# Patient Record
Sex: Female | Born: 1935
Health system: Southern US, Community
[De-identification: ages and names within clinical notes are randomized; demographics above are authoritative.]

## PROBLEM LIST (undated history)

## (undated) DIAGNOSIS — I440 Atrioventricular block, first degree: Secondary | ICD-10-CM

## (undated) DIAGNOSIS — I498 Other specified cardiac arrhythmias: Secondary | ICD-10-CM

## (undated) DIAGNOSIS — I639 Cerebral infarction, unspecified: Secondary | ICD-10-CM

## (undated) DIAGNOSIS — I491 Atrial premature depolarization: Secondary | ICD-10-CM

## (undated) DIAGNOSIS — S270XXA Traumatic pneumothorax, initial encounter: Secondary | ICD-10-CM

## (undated) DIAGNOSIS — M2040 Other hammer toe(s) (acquired), unspecified foot: Secondary | ICD-10-CM

## (undated) DIAGNOSIS — E785 Hyperlipidemia, unspecified: Secondary | ICD-10-CM

## (undated) DIAGNOSIS — R410 Disorientation, unspecified: Secondary | ICD-10-CM

## (undated) DIAGNOSIS — R002 Palpitations: Secondary | ICD-10-CM

## (undated) DIAGNOSIS — F329 Major depressive disorder, single episode, unspecified: Secondary | ICD-10-CM

## (undated) DIAGNOSIS — F32A Depression, unspecified: Secondary | ICD-10-CM

## (undated) DIAGNOSIS — M79609 Pain in unspecified limb: Secondary | ICD-10-CM

## (undated) DIAGNOSIS — Z7901 Long term (current) use of anticoagulants: Secondary | ICD-10-CM

## (undated) DIAGNOSIS — R413 Other amnesia: Secondary | ICD-10-CM

## (undated) DIAGNOSIS — H9190 Unspecified hearing loss, unspecified ear: Secondary | ICD-10-CM

## (undated) DIAGNOSIS — I480 Paroxysmal atrial fibrillation: Secondary | ICD-10-CM

## (undated) DIAGNOSIS — I493 Ventricular premature depolarization: Secondary | ICD-10-CM

## (undated) DIAGNOSIS — S2231XD Fracture of one rib, right side, subsequent encounter for fracture with routine healing: Secondary | ICD-10-CM

## (undated) DIAGNOSIS — I471 Supraventricular tachycardia: Secondary | ICD-10-CM

## (undated) HISTORY — DX: Other specified cardiac arrhythmias: I49.8

## (undated) HISTORY — DX: Unspecified hearing loss, unspecified ear: H91.90

## (undated) HISTORY — PX: ABDOMINAL HYSTERECTOMY: SHX81

## (undated) HISTORY — DX: Cerebral infarction, unspecified: I63.9

## (undated) HISTORY — DX: Ventricular premature depolarization: I49.3

## (undated) HISTORY — DX: Supraventricular tachycardia: I47.1

## (undated) HISTORY — DX: Depression, unspecified: F32.A

## (undated) HISTORY — DX: Hyperlipidemia, unspecified: E78.5

## (undated) HISTORY — PX: SHOULDER SURGERY: SHX246

## (undated) HISTORY — DX: Disorientation, unspecified: R41.0

## (undated) HISTORY — DX: Traumatic pneumothorax, initial encounter: S27.0XXA

## (undated) HISTORY — DX: Other amnesia: R41.3

## (undated) HISTORY — DX: Pain in unspecified limb: M79.609

## (undated) HISTORY — DX: Paroxysmal atrial fibrillation: I48.0

## (undated) HISTORY — DX: Atrioventricular block, first degree: I44.0

## (undated) HISTORY — PX: TONSILLECTOMY: SUR1361

## (undated) HISTORY — DX: Palpitations: R00.2

## (undated) HISTORY — DX: Atrial premature depolarization: I49.1

## (undated) HISTORY — DX: Other hammer toe(s) (acquired), unspecified foot: M20.40

## (undated) HISTORY — DX: Long term (current) use of anticoagulants: Z79.01

## (undated) HISTORY — DX: Fracture of one rib, right side, subsequent encounter for fracture with routine healing: S22.31XD

---
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
## 2014-09-03 DIAGNOSIS — R0609 Other forms of dyspnea: Secondary | ICD-10-CM | POA: Diagnosis not present

## 2014-09-03 DIAGNOSIS — J449 Chronic obstructive pulmonary disease, unspecified: Secondary | ICD-10-CM | POA: Diagnosis not present

## 2014-09-04 DIAGNOSIS — J310 Chronic rhinitis: Secondary | ICD-10-CM | POA: Diagnosis not present

## 2014-09-04 DIAGNOSIS — F328 Other depressive episodes: Secondary | ICD-10-CM | POA: Diagnosis not present

## 2014-09-04 DIAGNOSIS — J449 Chronic obstructive pulmonary disease, unspecified: Secondary | ICD-10-CM | POA: Diagnosis not present

## 2014-09-04 DIAGNOSIS — R0602 Shortness of breath: Secondary | ICD-10-CM | POA: Diagnosis not present

## 2014-09-18 DIAGNOSIS — J4520 Mild intermittent asthma, uncomplicated: Secondary | ICD-10-CM | POA: Diagnosis not present

## 2014-09-26 DIAGNOSIS — J340 Abscess, furuncle and carbuncle of nose: Secondary | ICD-10-CM | POA: Diagnosis not present

## 2014-09-26 DIAGNOSIS — F39 Unspecified mood [affective] disorder: Secondary | ICD-10-CM | POA: Diagnosis not present

## 2014-09-26 DIAGNOSIS — Z681 Body mass index (BMI) 19 or less, adult: Secondary | ICD-10-CM | POA: Diagnosis not present

## 2014-09-30 DIAGNOSIS — J449 Chronic obstructive pulmonary disease, unspecified: Secondary | ICD-10-CM | POA: Diagnosis not present

## 2014-10-10 DIAGNOSIS — J340 Abscess, furuncle and carbuncle of nose: Secondary | ICD-10-CM | POA: Diagnosis not present

## 2014-10-10 DIAGNOSIS — J3489 Other specified disorders of nose and nasal sinuses: Secondary | ICD-10-CM | POA: Diagnosis not present

## 2014-10-26 DIAGNOSIS — R634 Abnormal weight loss: Secondary | ICD-10-CM | POA: Diagnosis not present

## 2014-10-26 DIAGNOSIS — Z681 Body mass index (BMI) 19 or less, adult: Secondary | ICD-10-CM | POA: Diagnosis not present

## 2014-10-30 DIAGNOSIS — R0602 Shortness of breath: Secondary | ICD-10-CM | POA: Diagnosis not present

## 2014-10-30 DIAGNOSIS — B3781 Candidal esophagitis: Secondary | ICD-10-CM | POA: Diagnosis not present

## 2014-10-30 DIAGNOSIS — J449 Chronic obstructive pulmonary disease, unspecified: Secondary | ICD-10-CM | POA: Diagnosis not present

## 2014-10-30 DIAGNOSIS — J4520 Mild intermittent asthma, uncomplicated: Secondary | ICD-10-CM | POA: Diagnosis not present

## 2014-11-07 DIAGNOSIS — J3489 Other specified disorders of nose and nasal sinuses: Secondary | ICD-10-CM | POA: Diagnosis not present

## 2014-11-17 DIAGNOSIS — E785 Hyperlipidemia, unspecified: Secondary | ICD-10-CM | POA: Diagnosis not present

## 2014-11-17 DIAGNOSIS — I10 Essential (primary) hypertension: Secondary | ICD-10-CM | POA: Diagnosis not present

## 2014-11-17 DIAGNOSIS — I739 Peripheral vascular disease, unspecified: Secondary | ICD-10-CM | POA: Diagnosis not present

## 2014-11-17 DIAGNOSIS — R634 Abnormal weight loss: Secondary | ICD-10-CM | POA: Diagnosis not present

## 2014-11-17 DIAGNOSIS — M810 Age-related osteoporosis without current pathological fracture: Secondary | ICD-10-CM | POA: Diagnosis not present

## 2014-11-20 DIAGNOSIS — H16223 Keratoconjunctivitis sicca, not specified as Sjogren's, bilateral: Secondary | ICD-10-CM | POA: Diagnosis not present

## 2014-11-29 DIAGNOSIS — J449 Chronic obstructive pulmonary disease, unspecified: Secondary | ICD-10-CM | POA: Diagnosis not present

## 2014-12-30 DIAGNOSIS — J449 Chronic obstructive pulmonary disease, unspecified: Secondary | ICD-10-CM | POA: Diagnosis not present

## 2015-01-15 DIAGNOSIS — J4520 Mild intermittent asthma, uncomplicated: Secondary | ICD-10-CM | POA: Diagnosis not present

## 2015-01-15 DIAGNOSIS — G4709 Other insomnia: Secondary | ICD-10-CM | POA: Diagnosis not present

## 2015-01-29 DIAGNOSIS — J449 Chronic obstructive pulmonary disease, unspecified: Secondary | ICD-10-CM | POA: Diagnosis not present

## 2015-02-19 DIAGNOSIS — G4709 Other insomnia: Secondary | ICD-10-CM | POA: Diagnosis not present

## 2015-02-19 DIAGNOSIS — J4520 Mild intermittent asthma, uncomplicated: Secondary | ICD-10-CM | POA: Diagnosis not present

## 2015-02-28 DIAGNOSIS — H3531 Nonexudative age-related macular degeneration: Secondary | ICD-10-CM | POA: Diagnosis not present

## 2015-03-01 DIAGNOSIS — J449 Chronic obstructive pulmonary disease, unspecified: Secondary | ICD-10-CM | POA: Diagnosis not present

## 2015-03-20 DIAGNOSIS — Z681 Body mass index (BMI) 19 or less, adult: Secondary | ICD-10-CM | POA: Diagnosis not present

## 2015-03-20 DIAGNOSIS — T63481A Toxic effect of venom of other arthropod, accidental (unintentional), initial encounter: Secondary | ICD-10-CM | POA: Diagnosis not present

## 2015-03-31 DIAGNOSIS — J449 Chronic obstructive pulmonary disease, unspecified: Secondary | ICD-10-CM | POA: Diagnosis not present

## 2015-04-02 DIAGNOSIS — I10 Essential (primary) hypertension: Secondary | ICD-10-CM | POA: Diagnosis not present

## 2015-04-02 DIAGNOSIS — Z7689 Persons encountering health services in other specified circumstances: Secondary | ICD-10-CM | POA: Diagnosis not present

## 2015-04-02 DIAGNOSIS — Z681 Body mass index (BMI) 19 or less, adult: Secondary | ICD-10-CM | POA: Diagnosis not present

## 2015-04-02 DIAGNOSIS — Z0000 Encounter for general adult medical examination without abnormal findings: Secondary | ICD-10-CM | POA: Diagnosis not present

## 2015-04-09 DIAGNOSIS — Z1211 Encounter for screening for malignant neoplasm of colon: Secondary | ICD-10-CM | POA: Diagnosis not present

## 2015-04-12 DIAGNOSIS — Z681 Body mass index (BMI) 19 or less, adult: Secondary | ICD-10-CM | POA: Diagnosis not present

## 2015-04-12 DIAGNOSIS — E785 Hyperlipidemia, unspecified: Secondary | ICD-10-CM | POA: Diagnosis not present

## 2015-04-12 DIAGNOSIS — I10 Essential (primary) hypertension: Secondary | ICD-10-CM | POA: Diagnosis not present

## 2015-04-13 DIAGNOSIS — Z0000 Encounter for general adult medical examination without abnormal findings: Secondary | ICD-10-CM | POA: Diagnosis not present

## 2015-05-28 DIAGNOSIS — G4709 Other insomnia: Secondary | ICD-10-CM | POA: Diagnosis not present

## 2015-05-28 DIAGNOSIS — J449 Chronic obstructive pulmonary disease, unspecified: Secondary | ICD-10-CM | POA: Diagnosis not present

## 2015-06-12 DIAGNOSIS — Z23 Encounter for immunization: Secondary | ICD-10-CM | POA: Diagnosis not present

## 2015-06-14 DIAGNOSIS — I471 Supraventricular tachycardia: Secondary | ICD-10-CM | POA: Insufficient documentation

## 2015-06-18 DIAGNOSIS — I471 Supraventricular tachycardia: Secondary | ICD-10-CM | POA: Diagnosis not present

## 2015-07-23 DIAGNOSIS — M7061 Trochanteric bursitis, right hip: Secondary | ICD-10-CM | POA: Diagnosis not present

## 2015-07-23 DIAGNOSIS — Z681 Body mass index (BMI) 19 or less, adult: Secondary | ICD-10-CM | POA: Diagnosis not present

## 2015-08-20 DIAGNOSIS — I700 Atherosclerosis of aorta: Secondary | ICD-10-CM | POA: Diagnosis not present

## 2015-08-20 DIAGNOSIS — M810 Age-related osteoporosis without current pathological fracture: Secondary | ICD-10-CM | POA: Diagnosis not present

## 2015-08-20 DIAGNOSIS — I7409 Other arterial embolism and thrombosis of abdominal aorta: Secondary | ICD-10-CM | POA: Diagnosis not present

## 2015-08-20 DIAGNOSIS — I10 Essential (primary) hypertension: Secondary | ICD-10-CM | POA: Diagnosis not present

## 2015-08-20 DIAGNOSIS — F39 Unspecified mood [affective] disorder: Secondary | ICD-10-CM | POA: Diagnosis not present

## 2015-08-20 DIAGNOSIS — E785 Hyperlipidemia, unspecified: Secondary | ICD-10-CM | POA: Diagnosis not present

## 2015-09-13 DIAGNOSIS — M1990 Unspecified osteoarthritis, unspecified site: Secondary | ICD-10-CM | POA: Diagnosis not present

## 2015-09-13 DIAGNOSIS — J019 Acute sinusitis, unspecified: Secondary | ICD-10-CM | POA: Diagnosis not present

## 2015-09-17 DIAGNOSIS — J4520 Mild intermittent asthma, uncomplicated: Secondary | ICD-10-CM | POA: Diagnosis not present

## 2015-09-17 DIAGNOSIS — J449 Chronic obstructive pulmonary disease, unspecified: Secondary | ICD-10-CM | POA: Diagnosis not present

## 2015-09-17 DIAGNOSIS — G4700 Insomnia, unspecified: Secondary | ICD-10-CM | POA: Diagnosis not present

## 2015-09-18 DIAGNOSIS — M899 Disorder of bone, unspecified: Secondary | ICD-10-CM | POA: Diagnosis not present

## 2015-09-18 DIAGNOSIS — M25551 Pain in right hip: Secondary | ICD-10-CM | POA: Diagnosis not present

## 2015-11-06 DIAGNOSIS — H2703 Aphakia, bilateral: Secondary | ICD-10-CM | POA: Diagnosis not present

## 2015-11-12 DIAGNOSIS — J040 Acute laryngitis: Secondary | ICD-10-CM | POA: Diagnosis not present

## 2015-11-26 DIAGNOSIS — J019 Acute sinusitis, unspecified: Secondary | ICD-10-CM | POA: Diagnosis not present

## 2015-12-04 DIAGNOSIS — Z681 Body mass index (BMI) 19 or less, adult: Secondary | ICD-10-CM | POA: Diagnosis not present

## 2015-12-04 DIAGNOSIS — I10 Essential (primary) hypertension: Secondary | ICD-10-CM | POA: Diagnosis not present

## 2015-12-04 DIAGNOSIS — E785 Hyperlipidemia, unspecified: Secondary | ICD-10-CM | POA: Diagnosis not present

## 2015-12-04 DIAGNOSIS — I700 Atherosclerosis of aorta: Secondary | ICD-10-CM | POA: Diagnosis not present

## 2015-12-04 DIAGNOSIS — F3289 Other specified depressive episodes: Secondary | ICD-10-CM | POA: Diagnosis not present

## 2015-12-17 DIAGNOSIS — N390 Urinary tract infection, site not specified: Secondary | ICD-10-CM | POA: Diagnosis not present

## 2015-12-17 DIAGNOSIS — Z681 Body mass index (BMI) 19 or less, adult: Secondary | ICD-10-CM | POA: Diagnosis not present

## 2015-12-17 DIAGNOSIS — F3289 Other specified depressive episodes: Secondary | ICD-10-CM | POA: Diagnosis not present

## 2015-12-17 DIAGNOSIS — D519 Vitamin B12 deficiency anemia, unspecified: Secondary | ICD-10-CM | POA: Diagnosis not present

## 2015-12-24 DIAGNOSIS — F3289 Other specified depressive episodes: Secondary | ICD-10-CM | POA: Diagnosis not present

## 2015-12-24 DIAGNOSIS — Z681 Body mass index (BMI) 19 or less, adult: Secondary | ICD-10-CM | POA: Diagnosis not present

## 2016-01-07 DIAGNOSIS — F3289 Other specified depressive episodes: Secondary | ICD-10-CM | POA: Diagnosis not present

## 2016-01-07 DIAGNOSIS — Z681 Body mass index (BMI) 19 or less, adult: Secondary | ICD-10-CM | POA: Diagnosis not present

## 2016-01-23 DIAGNOSIS — F09 Unspecified mental disorder due to known physiological condition: Secondary | ICD-10-CM | POA: Diagnosis not present

## 2016-01-23 DIAGNOSIS — Z681 Body mass index (BMI) 19 or less, adult: Secondary | ICD-10-CM | POA: Diagnosis not present

## 2016-01-23 DIAGNOSIS — D519 Vitamin B12 deficiency anemia, unspecified: Secondary | ICD-10-CM | POA: Diagnosis not present

## 2016-02-04 DIAGNOSIS — S52352A Displaced comminuted fracture of shaft of radius, left arm, initial encounter for closed fracture: Secondary | ICD-10-CM | POA: Diagnosis not present

## 2016-02-04 DIAGNOSIS — S62102A Fracture of unspecified carpal bone, left wrist, initial encounter for closed fracture: Secondary | ICD-10-CM | POA: Diagnosis not present

## 2016-02-04 DIAGNOSIS — S52502A Unspecified fracture of the lower end of left radius, initial encounter for closed fracture: Secondary | ICD-10-CM | POA: Diagnosis not present

## 2016-02-08 DIAGNOSIS — S52502A Unspecified fracture of the lower end of left radius, initial encounter for closed fracture: Secondary | ICD-10-CM | POA: Diagnosis not present

## 2016-02-25 DIAGNOSIS — S52502D Unspecified fracture of the lower end of left radius, subsequent encounter for closed fracture with routine healing: Secondary | ICD-10-CM | POA: Diagnosis not present

## 2016-03-10 DIAGNOSIS — S52502D Unspecified fracture of the lower end of left radius, subsequent encounter for closed fracture with routine healing: Secondary | ICD-10-CM | POA: Diagnosis not present

## 2016-03-27 DIAGNOSIS — S52502D Unspecified fracture of the lower end of left radius, subsequent encounter for closed fracture with routine healing: Secondary | ICD-10-CM | POA: Diagnosis not present

## 2016-03-27 DIAGNOSIS — M79672 Pain in left foot: Secondary | ICD-10-CM | POA: Diagnosis not present

## 2016-03-31 DIAGNOSIS — M25632 Stiffness of left wrist, not elsewhere classified: Secondary | ICD-10-CM | POA: Diagnosis not present

## 2016-03-31 DIAGNOSIS — S52502D Unspecified fracture of the lower end of left radius, subsequent encounter for closed fracture with routine healing: Secondary | ICD-10-CM | POA: Diagnosis not present

## 2016-03-31 DIAGNOSIS — M25532 Pain in left wrist: Secondary | ICD-10-CM | POA: Diagnosis not present

## 2016-04-04 DIAGNOSIS — S52502D Unspecified fracture of the lower end of left radius, subsequent encounter for closed fracture with routine healing: Secondary | ICD-10-CM | POA: Diagnosis not present

## 2016-04-04 DIAGNOSIS — M25632 Stiffness of left wrist, not elsewhere classified: Secondary | ICD-10-CM | POA: Diagnosis not present

## 2016-04-04 DIAGNOSIS — M25532 Pain in left wrist: Secondary | ICD-10-CM | POA: Diagnosis not present

## 2016-04-07 DIAGNOSIS — M25532 Pain in left wrist: Secondary | ICD-10-CM | POA: Diagnosis not present

## 2016-04-07 DIAGNOSIS — M25632 Stiffness of left wrist, not elsewhere classified: Secondary | ICD-10-CM | POA: Diagnosis not present

## 2016-04-07 DIAGNOSIS — S52502D Unspecified fracture of the lower end of left radius, subsequent encounter for closed fracture with routine healing: Secondary | ICD-10-CM | POA: Diagnosis not present

## 2016-04-11 DIAGNOSIS — M25632 Stiffness of left wrist, not elsewhere classified: Secondary | ICD-10-CM | POA: Diagnosis not present

## 2016-04-11 DIAGNOSIS — S52502D Unspecified fracture of the lower end of left radius, subsequent encounter for closed fracture with routine healing: Secondary | ICD-10-CM | POA: Diagnosis not present

## 2016-04-11 DIAGNOSIS — M25532 Pain in left wrist: Secondary | ICD-10-CM | POA: Diagnosis not present

## 2016-04-15 DIAGNOSIS — M25532 Pain in left wrist: Secondary | ICD-10-CM | POA: Diagnosis not present

## 2016-04-15 DIAGNOSIS — M25632 Stiffness of left wrist, not elsewhere classified: Secondary | ICD-10-CM | POA: Diagnosis not present

## 2016-04-15 DIAGNOSIS — S52502D Unspecified fracture of the lower end of left radius, subsequent encounter for closed fracture with routine healing: Secondary | ICD-10-CM | POA: Diagnosis not present

## 2016-04-18 DIAGNOSIS — S52502D Unspecified fracture of the lower end of left radius, subsequent encounter for closed fracture with routine healing: Secondary | ICD-10-CM | POA: Diagnosis not present

## 2016-04-21 DIAGNOSIS — E785 Hyperlipidemia, unspecified: Secondary | ICD-10-CM | POA: Diagnosis not present

## 2016-04-21 DIAGNOSIS — I700 Atherosclerosis of aorta: Secondary | ICD-10-CM | POA: Diagnosis not present

## 2016-04-21 DIAGNOSIS — J449 Chronic obstructive pulmonary disease, unspecified: Secondary | ICD-10-CM | POA: Diagnosis not present

## 2016-04-21 DIAGNOSIS — F09 Unspecified mental disorder due to known physiological condition: Secondary | ICD-10-CM | POA: Diagnosis not present

## 2016-04-21 DIAGNOSIS — I10 Essential (primary) hypertension: Secondary | ICD-10-CM | POA: Diagnosis not present

## 2016-04-21 DIAGNOSIS — R634 Abnormal weight loss: Secondary | ICD-10-CM | POA: Diagnosis not present

## 2016-05-29 DIAGNOSIS — E785 Hyperlipidemia, unspecified: Secondary | ICD-10-CM | POA: Diagnosis not present

## 2016-05-29 DIAGNOSIS — I471 Supraventricular tachycardia: Secondary | ICD-10-CM | POA: Diagnosis not present

## 2016-05-30 DIAGNOSIS — I471 Supraventricular tachycardia: Secondary | ICD-10-CM | POA: Diagnosis not present

## 2016-06-06 DIAGNOSIS — Z23 Encounter for immunization: Secondary | ICD-10-CM | POA: Diagnosis not present

## 2016-07-30 DIAGNOSIS — M810 Age-related osteoporosis without current pathological fracture: Secondary | ICD-10-CM | POA: Diagnosis not present

## 2016-07-30 DIAGNOSIS — E782 Mixed hyperlipidemia: Secondary | ICD-10-CM | POA: Diagnosis not present

## 2016-07-30 DIAGNOSIS — I10 Essential (primary) hypertension: Secondary | ICD-10-CM | POA: Diagnosis not present

## 2016-11-05 DIAGNOSIS — J189 Pneumonia, unspecified organism: Secondary | ICD-10-CM | POA: Diagnosis not present

## 2016-11-05 DIAGNOSIS — G4700 Insomnia, unspecified: Secondary | ICD-10-CM | POA: Diagnosis not present

## 2016-11-05 DIAGNOSIS — F329 Major depressive disorder, single episode, unspecified: Secondary | ICD-10-CM | POA: Diagnosis not present

## 2016-11-05 DIAGNOSIS — E785 Hyperlipidemia, unspecified: Secondary | ICD-10-CM | POA: Diagnosis not present

## 2016-11-05 DIAGNOSIS — Z79899 Other long term (current) drug therapy: Secondary | ICD-10-CM | POA: Diagnosis not present

## 2016-11-05 DIAGNOSIS — I2699 Other pulmonary embolism without acute cor pulmonale: Secondary | ICD-10-CM | POA: Diagnosis not present

## 2016-11-05 DIAGNOSIS — Z880 Allergy status to penicillin: Secondary | ICD-10-CM | POA: Diagnosis not present

## 2016-11-05 DIAGNOSIS — Z885 Allergy status to narcotic agent status: Secondary | ICD-10-CM | POA: Diagnosis not present

## 2016-11-05 DIAGNOSIS — Z882 Allergy status to sulfonamides status: Secondary | ICD-10-CM | POA: Diagnosis not present

## 2016-11-05 DIAGNOSIS — J440 Chronic obstructive pulmonary disease with acute lower respiratory infection: Secondary | ICD-10-CM | POA: Diagnosis not present

## 2016-11-05 DIAGNOSIS — R079 Chest pain, unspecified: Secondary | ICD-10-CM | POA: Diagnosis not present

## 2016-11-07 DIAGNOSIS — R079 Chest pain, unspecified: Secondary | ICD-10-CM | POA: Diagnosis not present

## 2016-11-07 DIAGNOSIS — J449 Chronic obstructive pulmonary disease, unspecified: Secondary | ICD-10-CM | POA: Diagnosis not present

## 2016-11-07 DIAGNOSIS — E7800 Pure hypercholesterolemia, unspecified: Secondary | ICD-10-CM | POA: Diagnosis not present

## 2016-11-07 DIAGNOSIS — R002 Palpitations: Secondary | ICD-10-CM | POA: Diagnosis not present

## 2016-11-12 DIAGNOSIS — I2692 Saddle embolus of pulmonary artery without acute cor pulmonale: Secondary | ICD-10-CM | POA: Diagnosis not present

## 2016-11-26 DIAGNOSIS — I82442 Acute embolism and thrombosis of left tibial vein: Secondary | ICD-10-CM | POA: Diagnosis not present

## 2016-11-28 DIAGNOSIS — I2699 Other pulmonary embolism without acute cor pulmonale: Secondary | ICD-10-CM | POA: Diagnosis not present

## 2016-11-28 DIAGNOSIS — I80232 Phlebitis and thrombophlebitis of left tibial vein: Secondary | ICD-10-CM | POA: Diagnosis not present

## 2016-12-29 DIAGNOSIS — R490 Dysphonia: Secondary | ICD-10-CM | POA: Diagnosis not present

## 2016-12-29 DIAGNOSIS — J019 Acute sinusitis, unspecified: Secondary | ICD-10-CM | POA: Diagnosis not present

## 2016-12-30 DIAGNOSIS — G309 Alzheimer's disease, unspecified: Secondary | ICD-10-CM | POA: Diagnosis not present

## 2016-12-30 DIAGNOSIS — D518 Other vitamin B12 deficiency anemias: Secondary | ICD-10-CM | POA: Diagnosis not present

## 2016-12-30 DIAGNOSIS — D509 Iron deficiency anemia, unspecified: Secondary | ICD-10-CM | POA: Diagnosis not present

## 2016-12-30 DIAGNOSIS — I10 Essential (primary) hypertension: Secondary | ICD-10-CM | POA: Diagnosis not present

## 2016-12-30 DIAGNOSIS — I2699 Other pulmonary embolism without acute cor pulmonale: Secondary | ICD-10-CM | POA: Diagnosis not present

## 2016-12-30 DIAGNOSIS — E782 Mixed hyperlipidemia: Secondary | ICD-10-CM | POA: Diagnosis not present

## 2017-01-28 DIAGNOSIS — R490 Dysphonia: Secondary | ICD-10-CM | POA: Diagnosis not present

## 2017-01-28 DIAGNOSIS — J019 Acute sinusitis, unspecified: Secondary | ICD-10-CM | POA: Diagnosis not present

## 2017-02-16 DIAGNOSIS — M79675 Pain in left toe(s): Secondary | ICD-10-CM | POA: Diagnosis not present

## 2017-03-05 ENCOUNTER — Ambulatory Visit: Payer: Medicare Other | Admitting: Sports Medicine

## 2017-05-12 DIAGNOSIS — H2703 Aphakia, bilateral: Secondary | ICD-10-CM | POA: Diagnosis not present

## 2017-05-25 DIAGNOSIS — E782 Mixed hyperlipidemia: Secondary | ICD-10-CM | POA: Diagnosis not present

## 2017-05-25 DIAGNOSIS — Z23 Encounter for immunization: Secondary | ICD-10-CM | POA: Diagnosis not present

## 2017-05-25 DIAGNOSIS — G309 Alzheimer's disease, unspecified: Secondary | ICD-10-CM | POA: Diagnosis not present

## 2017-05-25 DIAGNOSIS — I10 Essential (primary) hypertension: Secondary | ICD-10-CM | POA: Diagnosis not present

## 2017-05-25 DIAGNOSIS — I2699 Other pulmonary embolism without acute cor pulmonale: Secondary | ICD-10-CM | POA: Diagnosis not present

## 2017-05-26 DIAGNOSIS — S299XXA Unspecified injury of thorax, initial encounter: Secondary | ICD-10-CM | POA: Diagnosis not present

## 2017-05-26 DIAGNOSIS — J1289 Other viral pneumonia: Secondary | ICD-10-CM | POA: Diagnosis not present

## 2017-05-26 DIAGNOSIS — M8580 Other specified disorders of bone density and structure, unspecified site: Secondary | ICD-10-CM | POA: Diagnosis not present

## 2017-05-26 DIAGNOSIS — M4185 Other forms of scoliosis, thoracolumbar region: Secondary | ICD-10-CM | POA: Diagnosis not present

## 2017-06-26 DIAGNOSIS — M2040 Other hammer toe(s) (acquired), unspecified foot: Secondary | ICD-10-CM

## 2017-06-26 DIAGNOSIS — M79609 Pain in unspecified limb: Secondary | ICD-10-CM | POA: Insufficient documentation

## 2017-06-26 DIAGNOSIS — R002 Palpitations: Secondary | ICD-10-CM

## 2017-06-26 DIAGNOSIS — I480 Paroxysmal atrial fibrillation: Secondary | ICD-10-CM | POA: Insufficient documentation

## 2017-06-26 DIAGNOSIS — E785 Hyperlipidemia, unspecified: Secondary | ICD-10-CM

## 2017-06-26 DIAGNOSIS — I498 Other specified cardiac arrhythmias: Secondary | ICD-10-CM

## 2017-06-26 DIAGNOSIS — I491 Atrial premature depolarization: Secondary | ICD-10-CM | POA: Insufficient documentation

## 2017-06-26 DIAGNOSIS — I493 Ventricular premature depolarization: Secondary | ICD-10-CM

## 2017-06-26 DIAGNOSIS — R413 Other amnesia: Secondary | ICD-10-CM | POA: Insufficient documentation

## 2017-06-26 DIAGNOSIS — I4819 Other persistent atrial fibrillation: Secondary | ICD-10-CM | POA: Insufficient documentation

## 2017-06-26 DIAGNOSIS — I440 Atrioventricular block, first degree: Secondary | ICD-10-CM

## 2017-06-26 HISTORY — DX: Paroxysmal atrial fibrillation: I48.0

## 2017-06-26 HISTORY — DX: Other specified cardiac arrhythmias: I49.8

## 2017-06-26 HISTORY — DX: Pain in unspecified limb: M79.609

## 2017-06-26 HISTORY — DX: Palpitations: R00.2

## 2017-06-26 HISTORY — DX: Ventricular premature depolarization: I49.3

## 2017-06-26 HISTORY — DX: Other hammer toe(s) (acquired), unspecified foot: M20.40

## 2017-06-26 HISTORY — DX: Atrial premature depolarization: I49.1

## 2017-06-26 HISTORY — DX: Atrioventricular block, first degree: I44.0

## 2017-06-26 HISTORY — DX: Hyperlipidemia, unspecified: E78.5

## 2017-06-30 DIAGNOSIS — S2231XD Fracture of one rib, right side, subsequent encounter for fracture with routine healing: Secondary | ICD-10-CM

## 2017-06-30 DIAGNOSIS — S270XXA Traumatic pneumothorax, initial encounter: Secondary | ICD-10-CM

## 2017-06-30 HISTORY — DX: Fracture of one rib, right side, subsequent encounter for fracture with routine healing: S22.31XD

## 2017-06-30 HISTORY — DX: Traumatic pneumothorax, initial encounter: S27.0XXA

## 2017-07-13 DIAGNOSIS — J939 Pneumothorax, unspecified: Secondary | ICD-10-CM | POA: Diagnosis not present

## 2017-07-21 DIAGNOSIS — I471 Supraventricular tachycardia: Secondary | ICD-10-CM | POA: Diagnosis not present

## 2017-07-21 DIAGNOSIS — Z7901 Long term (current) use of anticoagulants: Secondary | ICD-10-CM

## 2017-07-21 DIAGNOSIS — I480 Paroxysmal atrial fibrillation: Secondary | ICD-10-CM | POA: Diagnosis not present

## 2017-07-21 HISTORY — DX: Long term (current) use of anticoagulants: Z79.01

## 2017-08-12 DIAGNOSIS — M25511 Pain in right shoulder: Secondary | ICD-10-CM | POA: Diagnosis not present

## 2017-09-23 DIAGNOSIS — H2703 Aphakia, bilateral: Secondary | ICD-10-CM | POA: Diagnosis not present

## 2017-09-24 DIAGNOSIS — D518 Other vitamin B12 deficiency anemias: Secondary | ICD-10-CM | POA: Diagnosis not present

## 2017-09-24 DIAGNOSIS — I10 Essential (primary) hypertension: Secondary | ICD-10-CM | POA: Diagnosis not present

## 2017-09-24 DIAGNOSIS — D649 Anemia, unspecified: Secondary | ICD-10-CM | POA: Diagnosis not present

## 2017-09-24 DIAGNOSIS — I482 Chronic atrial fibrillation: Secondary | ICD-10-CM | POA: Diagnosis not present

## 2017-09-24 DIAGNOSIS — G309 Alzheimer's disease, unspecified: Secondary | ICD-10-CM | POA: Diagnosis not present

## 2017-09-24 DIAGNOSIS — E782 Mixed hyperlipidemia: Secondary | ICD-10-CM | POA: Diagnosis not present

## 2017-09-24 DIAGNOSIS — I2699 Other pulmonary embolism without acute cor pulmonale: Secondary | ICD-10-CM | POA: Diagnosis not present

## 2017-11-09 DIAGNOSIS — E782 Mixed hyperlipidemia: Secondary | ICD-10-CM | POA: Diagnosis not present

## 2017-11-09 DIAGNOSIS — I10 Essential (primary) hypertension: Secondary | ICD-10-CM | POA: Diagnosis not present

## 2017-11-09 DIAGNOSIS — I482 Chronic atrial fibrillation: Secondary | ICD-10-CM | POA: Diagnosis not present

## 2017-11-10 DIAGNOSIS — I471 Supraventricular tachycardia, unspecified: Secondary | ICD-10-CM | POA: Insufficient documentation

## 2017-11-10 HISTORY — DX: Supraventricular tachycardia, unspecified: I47.10

## 2017-11-10 HISTORY — DX: Supraventricular tachycardia: I47.1

## 2017-11-13 DIAGNOSIS — H6990 Unspecified Eustachian tube disorder, unspecified ear: Secondary | ICD-10-CM | POA: Diagnosis not present

## 2017-11-16 DIAGNOSIS — J449 Chronic obstructive pulmonary disease, unspecified: Secondary | ICD-10-CM | POA: Insufficient documentation

## 2017-11-16 DIAGNOSIS — I498 Other specified cardiac arrhythmias: Secondary | ICD-10-CM | POA: Insufficient documentation

## 2017-11-16 HISTORY — DX: Chronic obstructive pulmonary disease, unspecified: J44.9

## 2017-11-16 NOTE — Progress Notes (Signed)
Cardiology Office Note:    Date:  11/17/2017   ID:  [name], DOB 02/26/1926, MRN [id number]  PCP:  Abigail MiyamotoPerry, Lawrence Edward, MD  Cardiologist:  Norman HerrlichBrian Munley, MD    Referring MD: Abigail MiyamotoPerry, Lawrence Edward,*    ASSESSMENT:    1. PAT (paroxysmal atrial tachycardia) (HCC)   2. Chaotic atrial rhythm   3. Pure hypercholesterolemia   4. Chronic obstructive pulmonary disease, unspecified COPD type (HCC)    PLAN:    In order of problems listed above:  1. Stable she has had no recurrence and will continue low-dose selective beta-blocker.  She remains anticoagulated with a history of venous thromboembolism and remote atrial fibrillation 2. Stable remains in a chaotic atrial rhythm today asymptomatic 3. Stable continue her statin 4. Stable continue reduced dose anticoagulant   Next appointment: 6 months   Medication Adjustments/Labs and Tests Ordered: Current medicines are reviewed at length with the patient today.  Concerns regarding medicines are outlined above.  Orders Placed This Encounter  Procedures  . EKG 12-Lead   No orders of the defined types were placed in this encounter.   Chief Complaint  Patient presents with  . Follow-up    1.5  year follow up appt   . Atrial Fibrillation  . Anticoagulation    History of Present Illness:    [name] is a 84 y.o. female with a hx of SVT with PAT, Dyslipidemia, COPD  last seen 11/17/17.[name]. Compliance with diet, lifestyle and medications: Yes She has done well and is pleased with the quality of her life.  She had a slip from a stool rib fractures and pneumothorax and has recovered.  She has no palpitation or syncope chest pain shortness of breath.  Her daughter is concerned because at times her blood pressure is borderline low but is in range today Past Medical History:  Diagnosis Date  . Closed fracture of one rib of right side with routine healing 06/30/2017  . Current use of long term anticoagulation 07/21/2017  .  First degree atrioventricular block 06/26/2017  . Hammer toe 06/26/2017  . Hyperlipidemia 06/26/2017  . Other specified cardiac arrhythmias 06/26/2017  . Pain in limb 06/26/2017  . Palpitations 06/26/2017  . Paroxysmal atrial fibrillation (HCC) 06/26/2017   Overview:  remote history post operatively, CHADS 2 score=2 Overview:  Overview:  remote history post operatively, CHADS 2 score=2  . PAT (paroxysmal atrial tachycardia) (HCC) 06/14/2015  . Premature atrial complex 06/26/2017  . Premature ventricular contractions 06/26/2017  . PSVT (paroxysmal supraventricular tachycardia) (HCC) 11/10/2017  . Traumatic pneumothorax 06/30/2017    Past Surgical History:  Procedure Laterality Date  . ABDOMINAL HYSTERECTOMY    . SHOULDER SURGERY    . TONSILLECTOMY      Current Medications: Current Meds  Medication Sig  . acebutolol (SECTRAL) 200 MG capsule Take 200 mg by mouth daily.  . Cholecalciferol (VITAMIN D3) 1000 units CAPS Take 1,000 Units by mouth daily.  . mirtazapine (REMERON) 15 MG tablet Take 15 mg by mouth daily.  . Multiple Vitamin (MULTI-VITAMIN DAILY PO) Take 1 tablet by mouth daily.  . Polyethylene Glycol 3350 (PEG 3350) POWD Take 17 g by mouth daily.  . pravastatin (PRAVACHOL) 40 MG tablet Take 40 mg by mouth daily.  . rivaroxaban (XARELTO) 10 MG TABS tablet Take 10 mg by mouth daily.  . vitamin B-12 (CYANOCOBALAMIN) 1000 MCG tablet Take 1,000 mcg by mouth daily.     Allergies:   Zoloft [sertraline hcl]; Codeine; Penicillins; and  Sulfa antibiotics   Social History   Socioeconomic History  . Marital status: Single    Spouse name: None  . Number of children: None  . Years of education: None  . Highest education level: None  Social Needs  . Financial resource strain: None  . Food insecurity - worry: None  . Food insecurity - inability: None  . Transportation needs - medical: None  . Transportation needs - non-medical: None  Occupational History  . None  Tobacco Use    . Smoking status: Never Smoker  . Smokeless tobacco: Never Used  Substance and Sexual Activity  . Alcohol use: No    Frequency: Never  . Drug use: No  . Sexual activity: None  Other Topics Concern  . None  Social History Narrative  . None     Family History: The patient's family history includes Bone cancer in her mother; Breast cancer in her father and sister; Diabetes in her father; Heart disease in her father. ROS:   Please see the history of present illness.    All other systems reviewed and are negative.  EKGs/Labs/Other Studies Reviewed:    The following studies were reviewed today:  EKG:  EKG ordered today.  The ekg ordered today demonstrates chaotic atrial rhythm EKG 09/24/17 Chaotic atrial rhythm Recent Labs:  11/10/17: CBC CMP normal K 4.9 No results found for requested labs within last 8760 hours.  Recent Lipid Panel   Chol 215, HDL 57 LDL 137 No results found for: CHOL, TRIG, HDL, CHOLHDL, VLDL, LDLCALC, LDLDIRECT  Physical Exam:    VS:  BP 126/78 (BP Location: Right Arm, Patient Position: Sitting, Cuff Size: Normal)   Pulse 70   Ht 5\' 3"  (1.6 m)   Wt 108 lb 12.8 oz (49.4 kg)   SpO2 99%   BMI 19.27 kg/m     Wt Readings from Last 3 Encounters:  11/17/17 108 lb 12.8 oz (49.4 kg)  She appears frail GEN:  Well nourished, well developed in no acute distress HEENT: Normal NECK: No JVD; No carotid bruits LYMPHATICS: No lymphadenopathy CARDIAC:  RRR, no murmurs, rubs, gallops RESPIRATORY:  Clear to auscultation without rales, wheezing or rhonchi  ABDOMEN: Soft, non-tender, non-distended MUSCULOSKELETAL:  No edema; No deformity  SKIN: Warm and dry NEUROLOGIC:  Alert and oriented x 3 PSYCHIATRIC:  Normal affect    Signed, Norman Herrlich, MD  11/17/2017 11:52 AM    [facility]

## 2017-11-17 ENCOUNTER — Ambulatory Visit: Payer: Medicare Other | Admitting: Cardiology

## 2017-11-17 ENCOUNTER — Encounter: Payer: Self-pay | Admitting: Cardiology

## 2017-11-17 VITALS — BP 126/78 | HR 70 | Ht 63.0 in | Wt 108.8 lb

## 2017-11-17 DIAGNOSIS — J449 Chronic obstructive pulmonary disease, unspecified: Secondary | ICD-10-CM

## 2017-11-17 DIAGNOSIS — I471 Supraventricular tachycardia: Secondary | ICD-10-CM

## 2017-11-17 DIAGNOSIS — E7800 Pure hypercholesterolemia, unspecified: Secondary | ICD-10-CM

## 2017-11-17 DIAGNOSIS — I498 Other specified cardiac arrhythmias: Secondary | ICD-10-CM

## 2017-11-17 NOTE — Patient Instructions (Signed)

## 2017-11-24 ENCOUNTER — Encounter: Payer: Self-pay | Admitting: Cardiology

## 2018-01-05 DIAGNOSIS — H6121 Impacted cerumen, right ear: Secondary | ICD-10-CM | POA: Diagnosis not present

## 2018-01-05 DIAGNOSIS — J301 Allergic rhinitis due to pollen: Secondary | ICD-10-CM | POA: Diagnosis not present

## 2018-01-22 DIAGNOSIS — H9193 Unspecified hearing loss, bilateral: Secondary | ICD-10-CM | POA: Diagnosis not present

## 2018-01-22 DIAGNOSIS — H7403 Tympanosclerosis, bilateral: Secondary | ICD-10-CM | POA: Diagnosis not present

## 2018-01-22 DIAGNOSIS — H938X3 Other specified disorders of ear, bilateral: Secondary | ICD-10-CM | POA: Diagnosis not present

## 2018-01-22 DIAGNOSIS — J342 Deviated nasal septum: Secondary | ICD-10-CM | POA: Diagnosis not present

## 2018-02-04 DIAGNOSIS — R42 Dizziness and giddiness: Secondary | ICD-10-CM | POA: Diagnosis not present

## 2018-02-04 DIAGNOSIS — H66001 Acute suppurative otitis media without spontaneous rupture of ear drum, right ear: Secondary | ICD-10-CM | POA: Diagnosis not present

## 2018-02-19 DIAGNOSIS — Z9842 Cataract extraction status, left eye: Secondary | ICD-10-CM | POA: Diagnosis not present

## 2018-02-19 DIAGNOSIS — H353131 Nonexudative age-related macular degeneration, bilateral, early dry stage: Secondary | ICD-10-CM | POA: Diagnosis not present

## 2018-02-24 DIAGNOSIS — H66001 Acute suppurative otitis media without spontaneous rupture of ear drum, right ear: Secondary | ICD-10-CM | POA: Diagnosis not present

## 2018-03-10 DIAGNOSIS — H02403 Unspecified ptosis of bilateral eyelids: Secondary | ICD-10-CM | POA: Diagnosis not present

## 2018-03-11 DIAGNOSIS — I482 Chronic atrial fibrillation: Secondary | ICD-10-CM | POA: Diagnosis not present

## 2018-03-11 DIAGNOSIS — E782 Mixed hyperlipidemia: Secondary | ICD-10-CM | POA: Diagnosis not present

## 2018-03-11 DIAGNOSIS — G309 Alzheimer's disease, unspecified: Secondary | ICD-10-CM | POA: Diagnosis not present

## 2018-03-11 DIAGNOSIS — I10 Essential (primary) hypertension: Secondary | ICD-10-CM | POA: Diagnosis not present

## 2018-03-11 DIAGNOSIS — H7401 Tympanosclerosis, right ear: Secondary | ICD-10-CM | POA: Diagnosis not present

## 2018-04-19 DIAGNOSIS — H903 Sensorineural hearing loss, bilateral: Secondary | ICD-10-CM | POA: Diagnosis not present

## 2018-04-19 DIAGNOSIS — H6123 Impacted cerumen, bilateral: Secondary | ICD-10-CM | POA: Diagnosis not present

## 2018-04-19 DIAGNOSIS — H7311 Chronic myringitis, right ear: Secondary | ICD-10-CM | POA: Diagnosis not present

## 2018-05-11 DIAGNOSIS — H903 Sensorineural hearing loss, bilateral: Secondary | ICD-10-CM | POA: Diagnosis not present

## 2018-05-11 DIAGNOSIS — H6123 Impacted cerumen, bilateral: Secondary | ICD-10-CM | POA: Diagnosis not present

## 2018-05-17 ENCOUNTER — Other Ambulatory Visit: Payer: Self-pay | Admitting: Otolaryngology

## 2018-05-17 DIAGNOSIS — H903 Sensorineural hearing loss, bilateral: Secondary | ICD-10-CM

## 2018-05-26 DIAGNOSIS — S9032XA Contusion of left foot, initial encounter: Secondary | ICD-10-CM | POA: Diagnosis not present

## 2018-06-07 ENCOUNTER — Ambulatory Visit
Admission: RE | Admit: 2018-06-07 | Discharge: 2018-06-07 | Disposition: A | Discharge status: Home / Self Care | Payer: Medicare Other | Source: Ambulatory Visit | Attending: Otolaryngology | Admitting: Otolaryngology

## 2018-06-07 DIAGNOSIS — H903 Sensorineural hearing loss, bilateral: Secondary | ICD-10-CM | POA: Diagnosis not present

## 2018-06-07 MED ORDER — GADOBENATE DIMEGLUMINE 529 MG/ML IV SOLN
9.0000 mL | Freq: Once | INTRAVENOUS | Status: AC | PRN
  Administered 2018-06-07: 9 mL via INTRAVENOUS

## 2018-06-30 DIAGNOSIS — H2703 Aphakia, bilateral: Secondary | ICD-10-CM | POA: Diagnosis not present

## 2018-07-14 DIAGNOSIS — G309 Alzheimer's disease, unspecified: Secondary | ICD-10-CM | POA: Diagnosis not present

## 2018-07-14 DIAGNOSIS — I482 Chronic atrial fibrillation, unspecified: Secondary | ICD-10-CM | POA: Diagnosis not present

## 2018-07-14 DIAGNOSIS — E782 Mixed hyperlipidemia: Secondary | ICD-10-CM | POA: Diagnosis not present

## 2018-07-14 DIAGNOSIS — H906 Mixed conductive and sensorineural hearing loss, bilateral: Secondary | ICD-10-CM | POA: Diagnosis not present

## 2018-07-14 DIAGNOSIS — I10 Essential (primary) hypertension: Secondary | ICD-10-CM | POA: Diagnosis not present

## 2018-07-15 ENCOUNTER — Encounter: Payer: Self-pay | Admitting: Cardiology

## 2018-07-15 ENCOUNTER — Ambulatory Visit (INDEPENDENT_AMBULATORY_CARE_PROVIDER_SITE_OTHER): Payer: Medicare Other | Admitting: Cardiology

## 2018-07-15 VITALS — BP 116/80 | HR 95 | Ht 63.0 in | Wt 106.8 lb

## 2018-07-15 DIAGNOSIS — Z7901 Long term (current) use of anticoagulants: Secondary | ICD-10-CM

## 2018-07-15 DIAGNOSIS — I471 Supraventricular tachycardia: Secondary | ICD-10-CM

## 2018-07-15 DIAGNOSIS — I493 Ventricular premature depolarization: Secondary | ICD-10-CM | POA: Diagnosis not present

## 2018-07-15 NOTE — Progress Notes (Signed)
Cardiology Office Note:    Date:  07/15/2018   ID:  Katherine Hurdleoris S Eagles, DOB 11/11/1925, MRN [id number]  PCP:  Abigail MiyamotoPerry, Lawrence Edward, MD  Cardiologist:  Norman HerrlichBrian Munley, MD    Referring MD: Abigail MiyamotoPerry, Lawrence Edward,*    ASSESSMENT:    1. PAT (paroxysmal atrial tachycardia) (HCC)   2. Premature ventricular contractions   3. Current use of long term anticoagulation    PLAN:    In order of problems listed above:  1. Stable no breakthrough continue low-dose beta-blocker current anticoagulant 2. Stable continue beta-blocker 3. Continue anticoagulant  4. Stable continue pravastatin   Next appointment: 6 months    Medication Adjustments/Labs and Tests Ordered: Current medicines are reviewed at length with the patient today.  Concerns regarding medicines are outlined above.  No orders of the defined types were placed in this encounter.  No orders of the defined types were placed in this encounter.   Chief Complaint  Patient presents with  . Follow-up    PAT    History of Present Illness:    [name] is a 84 y.o. female with a hx of SVT with PAT, Dyslipidemia, COPD last seen 11/17/17. Compliance with diet, lifestyle and medications: Yes  She has done well except for fall and some persistent rib pain.  Very little palpitation no no bleeding on her current anticoagulant.  She is considering cochlear implant from a cardiovascular perspective she is low risk but I doubt that she is going to go ahead and do the procedure.  No chest pain or syncope. Past Medical History:  Diagnosis Date  . Closed fracture of one rib of right side with routine healing 06/30/2017  . Current use of long term anticoagulation 07/21/2017  . First degree atrioventricular block 06/26/2017  . Hammer toe 06/26/2017  . Hyperlipidemia 06/26/2017  . Other specified cardiac arrhythmias 06/26/2017  . Pain in limb 06/26/2017  . Palpitations 06/26/2017  . Paroxysmal atrial fibrillation (HCC) 06/26/2017   Overview:  remote history post operatively, CHADS 2 score=2 Overview:  Overview:  remote history post operatively, CHADS 2 score=2  . PAT (paroxysmal atrial tachycardia) (HCC) 06/14/2015  . Premature atrial complex 06/26/2017  . Premature ventricular contractions 06/26/2017  . PSVT (paroxysmal supraventricular tachycardia) (HCC) 11/10/2017  . Traumatic pneumothorax 06/30/2017    Past Surgical History:  Procedure Laterality Date  . ABDOMINAL HYSTERECTOMY    . SHOULDER SURGERY    . TONSILLECTOMY      Current Medications: Current Meds  Medication Sig  . acebutolol (SECTRAL) 200 MG capsule Take 200 mg by mouth daily.  . Cholecalciferol (VITAMIN D3) 1000 units CAPS Take 1,000 Units by mouth daily.  . mirtazapine (REMERON) 15 MG tablet Take 15 mg by mouth daily.  . Multiple Vitamin (MULTI-VITAMIN DAILY PO) Take 1 tablet by mouth daily.  . Polyethylene Glycol 3350 (PEG 3350) POWD Take 17 g by mouth daily as needed.   . pravastatin (PRAVACHOL) 40 MG tablet Take 40 mg by mouth daily.  . rivaroxaban (XARELTO) 10 MG TABS tablet Take 10 mg by mouth daily.  . vitamin B-12 (CYANOCOBALAMIN) 1000 MCG tablet Take 1,000 mcg by mouth daily.     Allergies:   Zoloft [sertraline hcl]; Codeine; Penicillins; and Sulfa antibiotics   Social History   Socioeconomic History  . Marital status: Single    Spouse name: Not on file  . Number of children: Not on file  . Years of education: Not on file  . Highest education level: Not on  file  Occupational History  . Not on file  Social Needs  . Financial resource strain: Not on file  . Food insecurity:    Worry: Not on file    Inability: Not on file  . Transportation needs:    Medical: Not on file    Non-medical: Not on file  Tobacco Use  . Smoking status: Never Smoker  . Smokeless tobacco: Never Used  Substance and Sexual Activity  . Alcohol use: No    Frequency: Never  . Drug use: No  . Sexual activity: Not on file  Lifestyle  . Physical  activity:    Days per week: Not on file    Minutes per session: Not on file  . Stress: Not on file  Relationships  . Social connections:    Talks on phone: Not on file    Gets together: Not on file    Attends religious service: Not on file    Active member of club or organization: Not on file    Attends meetings of clubs or organizations: Not on file    Relationship status: Not on file  Other Topics Concern  . Not on file  Social History Narrative  . Not on file     Family History: The patient's family history includes Bone cancer in her mother; Breast cancer in her father and sister; Diabetes in her father; Heart disease in her father. ROS:   Please see the history of present illness.    All other systems reviewed and are negative.  EKGs/Labs/Other Studies Reviewed:    The following studies were reviewed today:    Recent Labs: 03/11/2018 cholesterol 182 LDL 111 creatinine is normal No results found for requested labs within last 8760 hours.  Recent Lipid Panel No results found for: CHOL, TRIG, HDL, CHOLHDL, VLDL, LDLCALC, LDLDIRECT  Physical Exam:    VS:  BP 116/80 (BP Location: Left Arm, Patient Position: Sitting, Cuff Size: Small)   Pulse 95   Ht 5\' 3"  (1.6 m)   Wt 106 lb 12.8 oz (48.4 kg)   SpO2 99%   BMI 18.92 kg/m     Wt Readings from Last 3 Encounters:  07/15/18 106 lb 12.8 oz (48.4 kg)  11/17/17 108 lb 12.8 oz (49.4 kg)     GEN: Frail well nourished, her BMI is approaching cachectic range in no acute distress HEENT: Normal NECK: No JVD; No carotid bruits LYMPHATICS: No lymphadenopathy CARDIAC: RRR, no murmurs, rubs, gallops RESPIRATORY:  Clear to auscultation without rales, wheezing or rhonchi  ABDOMEN: Soft, non-tender, non-distended MUSCULOSKELETAL:  No edema; No deformity  SKIN: Warm and dry NEUROLOGIC:  Alert and oriented x 3 PSYCHIATRIC:  Normal affect    Signed, Norman Herrlich, MD  07/15/2018 3:14 PM    [facility]

## 2018-07-15 NOTE — Patient Instructions (Signed)

## 2018-08-10 DIAGNOSIS — H6123 Impacted cerumen, bilateral: Secondary | ICD-10-CM | POA: Diagnosis not present

## 2018-08-10 DIAGNOSIS — H903 Sensorineural hearing loss, bilateral: Secondary | ICD-10-CM | POA: Diagnosis not present

## 2018-09-03 ENCOUNTER — Telehealth: Payer: Self-pay | Admitting: Cardiology

## 2018-09-03 MED ORDER — ACEBUTOLOL HCL 200 MG PO CAPS: 200.0000 mg | ORAL_CAPSULE | Freq: Every day | ORAL | 1 refills | Status: DC

## 2018-09-03 NOTE — Telephone Encounter (Signed)
Refill for acebutolol sent to [facility].

## 2018-09-03 NOTE — Telephone Encounter (Signed)
°  Patient changing to [facility]  1. Which medications need to be refilled? (please list name of each medication and dose if known) Acebutolol 200mg   2. Which pharmacy/location (including street and city if local pharmacy) is medication to be sent to?[facility]  3. Do they need a 30 day or 90 day supply? 90

## 2018-09-08 DIAGNOSIS — M7071 Other bursitis of hip, right hip: Secondary | ICD-10-CM | POA: Diagnosis not present

## 2018-10-05 DIAGNOSIS — H0279 Other degenerative disorders of eyelid and periocular area: Secondary | ICD-10-CM | POA: Diagnosis not present

## 2018-10-05 DIAGNOSIS — H02135 Senile ectropion of left lower eyelid: Secondary | ICD-10-CM | POA: Diagnosis not present

## 2018-10-05 DIAGNOSIS — H53483 Generalized contraction of visual field, bilateral: Secondary | ICD-10-CM | POA: Diagnosis not present

## 2018-10-05 DIAGNOSIS — H04123 Dry eye syndrome of bilateral lacrimal glands: Secondary | ICD-10-CM | POA: Diagnosis not present

## 2018-10-05 DIAGNOSIS — H02132 Senile ectropion of right lower eyelid: Secondary | ICD-10-CM | POA: Diagnosis not present

## 2018-10-05 DIAGNOSIS — H02423 Myogenic ptosis of bilateral eyelids: Secondary | ICD-10-CM | POA: Diagnosis not present

## 2018-10-05 DIAGNOSIS — H02413 Mechanical ptosis of bilateral eyelids: Secondary | ICD-10-CM | POA: Diagnosis not present

## 2018-10-08 DIAGNOSIS — H53481 Generalized contraction of visual field, right eye: Secondary | ICD-10-CM | POA: Diagnosis not present

## 2018-10-08 DIAGNOSIS — H53483 Generalized contraction of visual field, bilateral: Secondary | ICD-10-CM | POA: Diagnosis not present

## 2018-10-08 DIAGNOSIS — H53482 Generalized contraction of visual field, left eye: Secondary | ICD-10-CM | POA: Diagnosis not present

## 2018-11-03 DIAGNOSIS — H2703 Aphakia, bilateral: Secondary | ICD-10-CM | POA: Diagnosis not present

## 2018-11-03 DIAGNOSIS — H3530 Unspecified macular degeneration: Secondary | ICD-10-CM | POA: Diagnosis not present

## 2018-11-08 ENCOUNTER — Inpatient Hospital Stay (HOSPITAL_COMMUNITY)
Admission: AD | Admit: 2018-11-08 | Discharge: 2018-11-10 | DRG: 065 | Disposition: A | Discharge status: Home Health | Payer: PPO | Source: Other Acute Inpatient Hospital | Attending: Neurology | Admitting: Neurology

## 2018-11-08 ENCOUNTER — Encounter: Payer: Self-pay | Admitting: Cardiology

## 2018-11-08 DIAGNOSIS — I4891 Unspecified atrial fibrillation: Secondary | ICD-10-CM | POA: Diagnosis not present

## 2018-11-08 DIAGNOSIS — E785 Hyperlipidemia, unspecified: Secondary | ICD-10-CM | POA: Diagnosis not present

## 2018-11-08 DIAGNOSIS — J449 Chronic obstructive pulmonary disease, unspecified: Secondary | ICD-10-CM | POA: Diagnosis present

## 2018-11-08 DIAGNOSIS — R4701 Aphasia: Secondary | ICD-10-CM | POA: Diagnosis present

## 2018-11-08 DIAGNOSIS — I361 Nonrheumatic tricuspid (valve) insufficiency: Secondary | ICD-10-CM | POA: Diagnosis not present

## 2018-11-08 DIAGNOSIS — Z9282 Status post administration of tPA (rtPA) in a different facility within the last 24 hours prior to admission to current facility: Secondary | ICD-10-CM

## 2018-11-08 DIAGNOSIS — Z8249 Family history of ischemic heart disease and other diseases of the circulatory system: Secondary | ICD-10-CM | POA: Diagnosis not present

## 2018-11-08 DIAGNOSIS — R29707 NIHSS score 7: Secondary | ICD-10-CM | POA: Diagnosis not present

## 2018-11-08 DIAGNOSIS — Z79899 Other long term (current) drug therapy: Secondary | ICD-10-CM | POA: Diagnosis not present

## 2018-11-08 DIAGNOSIS — Z833 Family history of diabetes mellitus: Secondary | ICD-10-CM | POA: Diagnosis not present

## 2018-11-08 DIAGNOSIS — I672 Cerebral atherosclerosis: Secondary | ICD-10-CM | POA: Diagnosis not present

## 2018-11-08 DIAGNOSIS — I6340 Cerebral infarction due to embolism of unspecified cerebral artery: Secondary | ICD-10-CM | POA: Diagnosis not present

## 2018-11-08 DIAGNOSIS — R531 Weakness: Secondary | ICD-10-CM | POA: Diagnosis not present

## 2018-11-08 DIAGNOSIS — Z90710 Acquired absence of both cervix and uterus: Secondary | ICD-10-CM

## 2018-11-08 DIAGNOSIS — R471 Dysarthria and anarthria: Secondary | ICD-10-CM | POA: Diagnosis present

## 2018-11-08 DIAGNOSIS — R479 Unspecified speech disturbances: Secondary | ICD-10-CM | POA: Diagnosis not present

## 2018-11-08 DIAGNOSIS — I63512 Cerebral infarction due to unspecified occlusion or stenosis of left middle cerebral artery: Secondary | ICD-10-CM | POA: Diagnosis not present

## 2018-11-08 DIAGNOSIS — Z7901 Long term (current) use of anticoagulants: Secondary | ICD-10-CM

## 2018-11-08 DIAGNOSIS — I482 Chronic atrial fibrillation, unspecified: Secondary | ICD-10-CM | POA: Diagnosis present

## 2018-11-08 DIAGNOSIS — I10 Essential (primary) hypertension: Secondary | ICD-10-CM | POA: Diagnosis not present

## 2018-11-08 DIAGNOSIS — H02401 Unspecified ptosis of right eyelid: Secondary | ICD-10-CM | POA: Diagnosis present

## 2018-11-08 DIAGNOSIS — I480 Paroxysmal atrial fibrillation: Secondary | ICD-10-CM | POA: Diagnosis not present

## 2018-11-08 DIAGNOSIS — I639 Cerebral infarction, unspecified: Secondary | ICD-10-CM | POA: Diagnosis present

## 2018-11-08 DIAGNOSIS — I6310 Cerebral infarction due to embolism of unspecified precerebral artery: Secondary | ICD-10-CM | POA: Diagnosis not present

## 2018-11-08 DIAGNOSIS — R4781 Slurred speech: Secondary | ICD-10-CM | POA: Diagnosis not present

## 2018-11-08 DIAGNOSIS — R29703 NIHSS score 3: Secondary | ICD-10-CM | POA: Diagnosis not present

## 2018-11-08 DIAGNOSIS — R4702 Dysphasia: Secondary | ICD-10-CM | POA: Diagnosis not present

## 2018-11-08 HISTORY — DX: Cerebral infarction, unspecified: I63.9

## 2018-11-08 LAB — MRSA PCR SCREENING: MRSA BY PCR: NEGATIVE

## 2018-11-08 MED ORDER — SODIUM CHLORIDE 0.9 % IV SOLN
INTRAVENOUS | Status: DC
  Administered 2018-11-08 – 2018-11-09 (×2): via INTRAVENOUS

## 2018-11-08 MED ORDER — NON FORMULARY: 3.0000 mg | Freq: Every evening | Status: DC | PRN

## 2018-11-08 MED ORDER — ACETAMINOPHEN 325 MG PO TABS: 650.0000 mg | ORAL_TABLET | ORAL | Status: DC | PRN

## 2018-11-08 MED ORDER — MELATONIN 3 MG PO TABS
3.0000 mg | ORAL_TABLET | Freq: Every evening | ORAL | Status: DC | PRN
  Administered 2018-11-09: 3 mg via ORAL
  Filled 2018-11-08 (×2): qty 1

## 2018-11-08 MED ORDER — PANTOPRAZOLE SODIUM 40 MG IV SOLR
40.0000 mg | Freq: Every day | INTRAVENOUS | Status: DC
  Administered 2018-11-08: 40 mg via INTRAVENOUS
  Filled 2018-11-08: qty 40

## 2018-11-08 MED ORDER — ACETAMINOPHEN 650 MG RE SUPP: 650.0000 mg | RECTAL | Status: DC | PRN

## 2018-11-08 MED ORDER — ACETAMINOPHEN 160 MG/5ML PO SOLN: 650.0000 mg | ORAL | Status: DC | PRN

## 2018-11-08 MED ORDER — STROKE: EARLY STAGES OF RECOVERY BOOK
Freq: Once | Status: AC
  Administered 2018-11-08

## 2018-11-08 MED ORDER — SENNOSIDES-DOCUSATE SODIUM 8.6-50 MG PO TABS: 1.0000 | ORAL_TABLET | Freq: Every evening | ORAL | Status: DC | PRN

## 2018-11-08 NOTE — H&P (Signed)
STROKE POST tPA ADMISSION H&P  CC: garbled speech  History is obtained from: Patient, records, patient's daughter at bedside  HPI: [name] is a 85 y.o. female past medical history of paroxysmal atrial fibrillation on anticoagulation with Xarelto which had been held for the past 10 days for an eye surgery, hyperlipidemia, presented to [facility] with garbled speech would last known normal at noon on 11/08/2018.  According to notes, she had slurred speech and was not acting right upon arrival of EMS and the family reported that the stuttering had been getting worse and en route to the hospital stuttering worsened even more.  She was evaluated by telemedicine neurology and was given IV TPA for presumed stroke after a noncontrast CT of the head did not show any evidence of bleed.  A CT angiogram head and neck was also performed to look for any evidence of large vessel occlusion and was negative for large vessel occlusion in the anterior posterior circulation but it did find a extensive soft plaque/mural thrombus in the distal aortic arch and including proximal descending aorta.  There was also severe suspected stenosis of the origin of the nondominant left vertebral artery.  Mild cervical carotid artery atherosclerosis without significant stenosis.  Minimal intracranial atherosclerosis without major branch occlusion or significant stenosis was also seen.  An incidental note was made of 1.6 cm right thyroid nodule.  [facility]  Patient reports that her speech now is clear than it was before.  She denies any tingling numbness weakness.  Denies any headaches.  Denies any visual symptoms. She denies any palpitations or chest pain.  She denies any shortness of breath or cough.  She denies any bleeding or clotting tendencies.  She denies any abdominal pain nausea vomiting.  She denies any anxiety depression at this time  but is concerned about her stroke.  Neurology note NIHSS-7 - 1 for-month and age, 1 for right arm drift, 2 for partial lower facial palsy, 2 for aphasia, 1 for dysarthria   LKW: 12 noon 11/08/2018 tpa given?:  [facility] via telemedicine neurology Premorbid modified Rankin scale (mRS): 0  ROS:ROS was performed and is negative except as noted in the HPI   Past Medical History:  Diagnosis Date  . Closed fracture of one rib of right side with routine healing 06/30/2017  . Current use of long term anticoagulation 07/21/2017  . First degree atrioventricular block 06/26/2017  . Hammer toe 06/26/2017  . Hyperlipidemia 06/26/2017  . Other specified cardiac arrhythmias 06/26/2017  . Pain in limb 06/26/2017  . Palpitations 06/26/2017  . Paroxysmal atrial fibrillation (HCC) 06/26/2017   Overview:  remote history post operatively, CHADS 2 score=2 Overview:  Overview:  remote history post operatively, CHADS 2 score=2  . PAT (paroxysmal atrial tachycardia) (HCC) 06/14/2015  . Premature atrial complex 06/26/2017  . Premature ventricular contractions 06/26/2017  . PSVT (paroxysmal supraventricular tachycardia) (HCC) 11/10/2017  . Traumatic pneumothorax 06/30/2017    Family History  Problem Relation Age of Onset  . Bone cancer Mother   . Breast cancer Father   . Diabetes Father   . Heart disease Father   . Breast cancer Sister    Social History:   reports that she has never smoked. She has never used smokeless tobacco. She reports that she does not drink alcohol or use drugs.  Medications  Current Facility-Administered Medications:  .   stroke: mapping our early  stages of recovery book, , Does not apply, Once, Milon Dikes, MD .  0.9 %  sodium chloride infusion, , Intravenous, Continuous, Milon Dikes, MD .  acetaminophen (TYLENOL) tablet 650 mg, 650 mg, Oral, Q4H PRN **OR** acetaminophen (TYLENOL) solution 650 mg, 650 mg, Per Tube, Q4H PRN **OR** acetaminophen (TYLENOL)  suppository 650 mg, 650 mg, Rectal, Q4H PRN, Milon Dikes, MD .  pantoprazole (PROTONIX) injection 40 mg, 40 mg, Intravenous, QHS, Milon Dikes, MD .  senna-docusate (Senokot-S) tablet 1 tablet, 1 tablet, Oral, QHS PRN, Milon Dikes, MD  Exam: Current vital signs: Heart rate 87 irregularly irregular, saturation 97% on room air, respiratory rate 17/min Blood pressure 146/96   GENERAL: Awake, alert in NAD HEENT: - Normocephalic and atraumatic, dry mm, no LN++, no Thyromegally LUNGS - Clear to auscultation bilaterally with no wheezes CV - S1S2 RRR, no m/r/g, equal pulses bilaterally. ABDOMEN - Soft, nontender, nondistended with normoactive BS Ext: warm, well perfused, intact peripheral pulses, no edema  NEURO:  Mental Status: AA&Ox3  Language: speech is mostly clear with mild hesitancy.  Naming, repetition, fluency, and comprehension intact. Cranial Nerves: PERRL. EOMI, visual fields full, no facial asymmetry, facial sensation intact, hearing intact, tongue/uvula/soft palate midline, normal sternocleidomastoid and trapezius muscle strength. No evidence of tongue atrophy or fibrillations Motor: 5/5 with no drift vertically. Tone: is normal and bulk is normal Sensation- Intact to light touch bilaterally Coordination: FTN intact bilaterally, no ataxia in BLE. Gait- deferred  NIHSS-1   Labs I have reviewed labs in epic and the results pertinent to this consultation are: Labs reviewed from [facility] WBC count 6.1, hemoglobin 12, hematocrit 35.6, platelet count 184,000. Sodium 138, potassium 4.5, chloride 105, bicarb 28, BUN 22, creatinine 0.8, sugar 96 EKG report-atrial flutter  Imaging I have reviewed the images obtained: Through canopy PACS  CT-scan of the brain-no acute changes.  No bleed. CT angiography of the head and neck shows normal variant aortic arch branching pattern with common origin of the brachiocephalic and left common carotid arteries and with the  left vertebral artery arising directly from the arch.  Extensive soft plaque/middle thrombus in the aortic arch distal to the left subclavian origin extending into the descending thoracic aorta which is incompletely imaged.  Calcified plaque in the proximal subclavian arteries bilaterally without significant stenosis.  Portal distal cervical right ICA with mild calcified plaque at the bifurcation with no significant stenosis or dissection.  Mild calcified plaque at the left carotid bifurcation with no evidence of significant stenosis or dissection.  Patent vertebral arteries with the right being strongly dominant and suspected severe left vertebral artery origin stenosis due to calcified plaque.  1.6 cm thyroid nodule incidental finding.  Assessment: 85 year old woman with past medical history of paroxysmal atrial fibrillation, who is on anticoagulation which had been held for the 10 days prior to presentation in preparation for an eye surgery presented to an outside hospital with garbled speech initial NIH stroke scale of 7, for which she was given IV TPA after head imaging by noncontrast CT scan revealed no bleed. Currently, she only has mild symptoms related to mild hesitancy of her speech-NIH stroke scale 1. No focal cranial nerve, sensory or motor deficits.  No visual deficits. Admitted for post TPA care. Callergy of stroke-likely cardioembolic versus atheroembolic from the aortic arch thrombus.  Plan:  Acute Ischemic Stroke  Acuity: Acute Current Suspected Etiology: Cardioembolic versus atheroembolic Continue Evaluation:  -Admit to: Neurological ICU -Hold Aspirin until 24 hour post tPA neuroimaging  is stable and without evidence of bleeding -Blood pressure control, goal of SYS <180 -MRI/ECHO/A1C/Lipid panel. -Hyperglycemia management per SSI to maintain glucose 140-180mg /dL. -PT/OT/ST therapies and recommendations when able  CNS -Close neuro monitoring  Dysarthria Dysphagia following  cerebral infarction  -NPO until cleared by bedside evaluation or speech evaluation -ST -Advance diet as tolerated  Hemiplegia and hemiparesis following cerebral infarction affecting right dominant side -PT/OT  RESP No active issues -Monitor clinically   CV -Aggressive BP control, goal SBP <180   Aortic arch plaque/mural thrombus -Cardiology consultation in the morning.  Currently anticoagulated with TPA.  Might need heparin drip going forward.  Paroxysmal atrial fibrillation -Has been off of her anticoagulation preparation for eye surgery. -Keep anticoagulation on hold for now as she is post TPA. -She will require anticoagulation long-term for stroke prevention, for atrial fibrillation as well as because of the mural thrombus. -TTE -Cards Consult for the aortic arch mural thrombus -Currently rate controlled.  Hyperlipidemia, unspecified  - Statin for goal LDL < 70  HEME Mild anemia with lower hematocrit and normal hemoglobin. -Monitor -transfuse for hgb < 7  ENDO -Start oral meds -goal HgbA1c < 7  GI/GU No active issues -Gentle hydration  Fluid/Electrolyte Disorders No active issues -Repeat BMP -Replete as necessary   ID Possible Aspiration PNA -CXR -NPO-until passes bedside swallow/formal swallow eval -Monitor  Prophylaxis DVT: SCDs only GI: Pantoprazole Bowel: Docusate/senna for constipation  Diet: NPO until cleared by formal bedside swallow or speech therapy  Code Status: Full Code    -- Milon Dikes, MD [facility] Pager: [phone] If 7pm to 7am, please call on call as listed on AMION.  CRITICAL CARE ATTESTATION Performed by: Milon Dikes, MD Total critical care time: 35 minutes Critical care time was exclusive of separately billable procedures and treating other patients and/or supervising APPs/Residents/Students Critical care was necessary to treat or prevent imminent or life-threatening deterioration due to acute ischemic  stroke, status post thrombolytic, aortic arch thrombus/plaque, paroxysmal atrial fibrillation. This patient is critically ill and at significant risk for neurological worsening and/or death and care requires constant monitoring. Critical care was time spent personally by me on the following activities: development of treatment plan with patient and/or surrogate as well as nursing, discussions with consultants, evaluation of patient's response to treatment, examination of patient, obtaining history from patient or surrogate, ordering and performing treatments and interventions, ordering and review of laboratory studies, ordering and review of radiographic studies, pulse oximetry, re-evaluation of patient's condition, participation in multidisciplinary rounds and medical decision making of high complexity in the care of this patient.

## 2018-11-09 ENCOUNTER — Inpatient Hospital Stay (HOSPITAL_COMMUNITY): Payer: PPO

## 2018-11-09 ENCOUNTER — Encounter (HOSPITAL_COMMUNITY): Payer: Self-pay

## 2018-11-09 ENCOUNTER — Other Ambulatory Visit: Payer: Self-pay

## 2018-11-09 DIAGNOSIS — I361 Nonrheumatic tricuspid (valve) insufficiency: Secondary | ICD-10-CM

## 2018-11-09 LAB — BASIC METABOLIC PANEL
Anion gap: 6 (ref 5–15)
BUN: 12 mg/dL (ref 8–23)
CHLORIDE: 109 mmol/L (ref 98–111)
CO2: 23 mmol/L (ref 22–32)
Calcium: 8.3 mg/dL — ABNORMAL LOW (ref 8.9–10.3)
Creatinine, Ser: 0.75 mg/dL (ref 0.44–1.00)
GFR calc Af Amer: >60 mL/min (ref >60)
GFR calc non Af Amer: >60 mL/min (ref >60)
Glucose, Bld: 85 mg/dL (ref 70–99)
Potassium: 3.9 mmol/L (ref 3.5–5.1)
SODIUM: 138 mmol/L (ref 135–145)

## 2018-11-09 LAB — HEMOGLOBIN A1C
Hgb A1c MFr Bld: 5.3 % (ref 4.8–5.6)
Mean Plasma Glucose: 105.41 mg/dL

## 2018-11-09 LAB — ECHOCARDIOGRAM COMPLETE
Height: 63 in
Weight: 1664.91 oz

## 2018-11-09 LAB — CBC
HCT: 32.3 % — ABNORMAL LOW (ref 36.0–46.0)
HEMOGLOBIN: 10.5 g/dL — AB (ref 12.0–15.0)
MCH: 32.9 pg (ref 26.0–34.0)
MCHC: 32.5 g/dL (ref 30.0–36.0)
MCV: 101.3 fL — ABNORMAL HIGH (ref 80.0–100.0)
Platelets: 183 10*3/uL (ref 150–400)
RBC: 3.19 MIL/uL — AB (ref 3.87–5.11)
RDW: 14.1 % (ref 11.5–15.5)
WBC: 5.6 10*3/uL (ref 4.0–10.5)
nRBC: 0 % (ref 0.0–0.2)

## 2018-11-09 LAB — LIPID PANEL
Cholesterol: 181 mg/dL (ref 0–200)
HDL: 42 mg/dL (ref >40)
LDL CALC: 128 mg/dL — AB (ref 0–99)
Total CHOL/HDL Ratio: 4.3 RATIO
Triglycerides: 57 mg/dL (ref <150)
VLDL: 11 mg/dL (ref 0–40)

## 2018-11-09 MED ORDER — RIVAROXABAN 15 MG PO TABS
15.0000 mg | ORAL_TABLET | Freq: Every day | ORAL | Status: DC
  Administered 2018-11-09: 15 mg via ORAL
  Filled 2018-11-09: qty 1

## 2018-11-09 MED ORDER — PRAVASTATIN SODIUM 40 MG PO TABS
40.0000 mg | ORAL_TABLET | Freq: Every day | ORAL | Status: DC
  Administered 2018-11-09: 40 mg via ORAL
  Filled 2018-11-09: qty 1

## 2018-11-09 MED ORDER — PANTOPRAZOLE SODIUM 40 MG PO TBEC
40.0000 mg | DELAYED_RELEASE_TABLET | Freq: Every day | ORAL | Status: DC
  Administered 2018-11-09 – 2018-11-10 (×2): 40 mg via ORAL
  Filled 2018-11-09 (×2): qty 1

## 2018-11-09 MED ORDER — VITAMIN B-12 1000 MCG PO TABS
1000.0000 ug | ORAL_TABLET | Freq: Every day | ORAL | Status: DC
  Administered 2018-11-09 – 2018-11-10 (×2): 1000 ug via ORAL
  Filled 2018-11-09 (×2): qty 1

## 2018-11-09 MED ORDER — LABETALOL HCL 5 MG/ML IV SOLN: 5.0000 mg | INTRAVENOUS | Status: DC | PRN

## 2018-11-09 MED ORDER — MIRTAZAPINE 15 MG PO TABS
15.0000 mg | ORAL_TABLET | Freq: Every day | ORAL | Status: DC
  Administered 2018-11-09: 15 mg via ORAL
  Filled 2018-11-09 (×3): qty 1

## 2018-11-09 MED ORDER — ACEBUTOLOL HCL 200 MG PO CAPS
200.0000 mg | ORAL_CAPSULE | Freq: Every day | ORAL | Status: DC
  Administered 2018-11-09 – 2018-11-10 (×2): 200 mg via ORAL
  Filled 2018-11-09 (×2): qty 1

## 2018-11-09 MED ORDER — VITAMIN D 25 MCG (1000 UNIT) PO TABS
1000.0000 [IU] | ORAL_TABLET | Freq: Every day | ORAL | Status: DC
  Administered 2018-11-09 – 2018-11-10 (×2): 1000 [IU] via ORAL
  Filled 2018-11-09 (×2): qty 1

## 2018-11-09 MED ORDER — WHITE PETROLATUM EX OINT
TOPICAL_OINTMENT | CUTANEOUS | Status: AC
  Filled 2018-11-09: qty 28.35

## 2018-11-09 NOTE — Progress Notes (Signed)
Patient arrived to [phone]. Alert and oriented x4. Complains of no pain, sitting in the chair at this time.

## 2018-11-09 NOTE — Progress Notes (Signed)
ANTICOAGULATION CONSULT NOTE - Initial Consult  Pharmacy Consult for Rivaroxaban Indication: atrial fibrillation   Patient Measurements: Height: 5\' 3"  (160 cm) Weight: 104 lb 0.9 oz (47.2 kg) IBW/kg (Calculated) : 52.4   Vital Signs: Temp: 98.3 F (36.8 C) (03/10 1200) Temp Source: Oral (03/10 1200) BP: 95/70 (03/10 1300) Pulse Rate: 86 (03/10 1300)  Labs: Recent Labs    11/09/18 0512  HGB 10.5*  HCT 32.3*  PLT 183  CREATININE 0.75    Medical History: Past Medical History:  Diagnosis Date  . Closed fracture of one rib of right side with routine healing 06/30/2017  . Current use of long term anticoagulation 07/21/2017  . First degree atrioventricular block 06/26/2017  . Hammer toe 06/26/2017  . Hyperlipidemia 06/26/2017  . Other specified cardiac arrhythmias 06/26/2017  . Pain in limb 06/26/2017  . Palpitations 06/26/2017  . Paroxysmal atrial fibrillation (HCC) 06/26/2017   Overview:  remote history post operatively, CHADS 2 score=2 Overview:  Overview:  remote history post operatively, CHADS 2 score=2  . PAT (paroxysmal atrial tachycardia) (HCC) 06/14/2015  . Premature atrial complex 06/26/2017  . Premature ventricular contractions 06/26/2017  . PSVT (paroxysmal supraventricular tachycardia) (HCC) 11/10/2017  . Traumatic pneumothorax 06/30/2017    Assessment: 85 year old female on Xarelto prior to admission for history of AFib. She went off of Xarelto for ~10 days in anticipation of surgery and developed a stroke - she received tPA at an outside hospital and was transferred here for post-stroke management. Pharmacy is consulted to resume Xarelto.  Ms. Charlier was taking Xarelto 10 mg po daily. It appears that she has a remote history of VTE in addition to AFib. The 10 mg dose is appropriate for patients who need anticoagulation for secondary prophylaxis; however, for patients who additionally have AFib the dose should be 15-20 mg daily. Therefore, I will increase  her dose to 15 mg daily.   Goal of Therapy:  Monitor platelets by anticoagulation protocol: Yes    Plan:  -Rivaroxaban 15 mg po daily -Monitor s/sx bleeding   Baldemar Friday 11/09/2018,3:33 PM

## 2018-11-09 NOTE — Progress Notes (Signed)
  Echocardiogram 2D Echocardiogram has been performed.  Delcie Roch 11/09/2018, 4:37 PM

## 2018-11-09 NOTE — Progress Notes (Signed)
Rehab Admissions Coordinator Note:  Patient was screened by Stephania Fragmin for appropriateness for an Inpatient Acute Rehab Consult.  At this time, pt appears to be negative for CVA and currently ambulating 200 feet with min assist.  Expect that over the next few days pt will improve and not need CIR level therapies.  Call me with questions.   Stephania Fragmin 11/09/2018, 3:07 PM  I can be reached at [phone].

## 2018-11-09 NOTE — Progress Notes (Signed)
STROKE TEAM PROGRESS NOTE   SUBJECTIVE (INTERVAL HISTORY) Her RN is at the bedside.  Overall her condition is completely resolved. She said she supposed to have right eyelid surgery today but she would not have it done anymore as she can not go off the Xarelto. She thinks her speech and right UE weakness are resolved.    OBJECTIVE Temp:  [98.1 F (36.7 C)-98.3 F (36.8 C)] 98.1 F (36.7 C) (03/10 0800) Pulse Rate:  [67-100] 77 (03/10 0900) Cardiac Rhythm: Normal sinus rhythm;Atrial fibrillation (03/10 0800) Resp:  [14-24] 17 (03/10 0900) BP: (91-146)/(56-108) 111/68 (03/10 0900) SpO2:  [90 %-99 %] 95 % (03/10 0900)  No results for input(s): GLUCAP in the last 168 hours. No results for input(s): NA, K, CL, CO2, GLUCOSE, BUN, CREATININE, CALCIUM, MG, PHOS in the last 168 hours. No results for input(s): AST, ALT, ALKPHOS, BILITOT, PROT, ALBUMIN in the last 168 hours. No results for input(s): WBC, NEUTROABS, HGB, HCT, MCV, PLT in the last 168 hours. No results for input(s): CKTOTAL, CKMB, CKMBINDEX, TROPONINI in the last 168 hours. No results for input(s): LABPROT, INR in the last 72 hours. No results for input(s): COLORURINE, LABSPEC, PHURINE, GLUCOSEU, HGBUR, BILIRUBINUR, KETONESUR, PROTEINUR, UROBILINOGEN, NITRITE, LEUKOCYTESUR in the last 72 hours.  Invalid input(s): APPERANCEUR     Component Value Date/Time   CHOL 181 11/09/2018 0512   TRIG 57 11/09/2018 0512   HDL 42 11/09/2018 0512   CHOLHDL 4.3 11/09/2018 0512   VLDL 11 11/09/2018 0512   LDLCALC 128 (H) 11/09/2018 0512   Lab Results  Component Value Date   HGBA1C 5.3 11/09/2018   No results found for: LABOPIA, COCAINSCRNUR, LABBENZ, AMPHETMU, THCU, LABBARB  No results for input(s): ETH in the last 168 hours.  I have personally reviewed the radiological images below and agree with the radiology interpretations.  CT head no acute abnormalities  CTA hea dand neck - aortic atherosclerosis with soft plaques, nondominant  left VA origin stenosis  TTE pending  MRI pending   PHYSICAL EXAM  Temp:  [98.1 F (36.7 C)-98.3 F (36.8 C)] 98.1 F (36.7 C) (03/10 0800) Pulse Rate:  [67-100] 77 (03/10 0900) Resp:  [14-24] 17 (03/10 0900) BP: (91-146)/(56-108) 111/68 (03/10 0900) SpO2:  [90 %-99 %] 95 % (03/10 0900)  General - Well nourished, well developed, in no apparent distress.  Ophthalmologic - fundi not visualized due to noncooperation.  Cardiovascular - irregularly irregular heart rate and rhythm..  Mental Status -  Level of arousal and orientation to time, place, and person were intact. Language including expression, naming, repetition, comprehension was assessed and found intact. Fund of Knowledge was assessed and was intact.  Cranial Nerves II - XII - II - Visual field intact OU. III, IV, VI - Extraocular movements intact. V - Facial sensation intact bilaterally. VII - Facial movement intact bilaterally. VIII - Hearing & vestibular intact bilaterally. X - Palate elevates symmetrically. XI - Chin turning & shoulder shrug intact bilaterally. XII - Tongue protrusion intact.  Motor Strength - The patient's strength was normal in all extremities and pronator drift was absent.  Bulk was normal and fasciculations were absent.   Motor Tone - Muscle tone was assessed at the neck and appendages and was normal.  Reflexes - The patient's reflexes were symmetrical in all extremities and she had no pathological reflexes.  Sensory - Light touch, temperature/pinprick were assessed and were symmetrical.    Coordination - The patient had normal movements in the hands and feet with  no ataxia or dysmetria.  Tremor was absent.  Gait and Station - deferred.   ASSESSMENT/PLAN Ms. FAIGY HIBBEN is a 85 y.o. female with history of HLD and AFib off Xarelto for 9 days in preparation of left eyelid surgery admitted for speech difficulty and RUE weakness. TPA given 1515 on 11/08/18.    Stroke:  left MCA  infarct embolic secondary to Afib off Xarelto for potential right eyelid surgery  Resultant back to baseline  CT no acute abnormality  MRI  pending  CTA head and neck aortic atherosclerosis with soft plaques, nondominant left VA origin stenosis  2D Echo pending  LDL 128  HgbA1c 5.3  SCDs for VTE prophylaxis  On diet  Xarelto on hold prior to admission, now on No antithrombotic within 24h of tPA  Ongoing aggressive stroke risk factor management  Therapy recommendations:  Pending   Disposition:  Pending   AF, chronic  Was on Xarelto but on hold for potential right eyelid surgery  Rate controlled  Will consider to resume Xarelto depending on MRI findings  Resume acebutolol  BP management  Stable . Permissive hypertension (OK if <180/105) for post tPA.  Long term BP goal normotensive  Resumed home acebutolol  Hyperlipidemia  Home meds:  Pravastatin 40   LDL 128, goal < 70  Now on pravastatin 40  Continue statin at discharge  Other Stroke Risk Factors  Advanced age  Other Active Problems  Right eyelid drooping - no change with time during the day - surgery cancelled at this time  Hospital day # 1  This patient is critically ill due to stroke s/p tPA, afib and at significant risk of neurological worsening, death form recurrent stroke, hemorrhagic conversion, heart failure, seizure. This patient's care requires constant monitoring of vital signs, hemodynamics, respiratory and cardiac monitoring, review of multiple databases, neurological assessment, discussion with family, other specialists and medical decision making of high complexity. I spent 40 minutes of neurocritical care time in the care of this patient.   Marvel Plan, MD PhD Stroke Neurology 11/09/2018 9:49 AM    To contact Stroke Continuity provider, please refer to [email or web address]. After hours, contact General Neurology

## 2018-11-09 NOTE — Evaluation (Signed)
Physical Therapy Evaluation Patient Details Name: [name] MRN: [id number] DOB: [birth year] Today's Date: 11/09/2018   History of Present Illness   [name] is a 85 y.o. female past medical history of paroxysmal atrial fibrillation on anticoagulation with Xarelto which had been held for the past 10 days for an eye surgery, hyperlipidemia, presented to [facility] with garbled speech would last known normal at noon on 11/08/2018. CT revealed clot in aortic arch. IV tpa given on 3/9 and then transferred to [facility] hospital.  Clinical Impression  Pt admitted with above. Pt with impaired balance requiring minA to safely ambulate and modA to maintain balance s/p loss of balance. Pt also with expressive difficulties as well. PTA pt was indep, driving, and doing yardwork but now requires assist for ambulation and ADLs as patient with increased falls risk. Recommend aggressive rehab course to achieve maximal functional recovery.    Follow Up Recommendations CIR;Supervision/Assistance - 24 hour    Equipment Recommendations  None recommended by PT(TBD at next venue)    Recommendations for Other Services Rehab consult     Precautions / Restrictions Precautions Precautions: Fall Restrictions Weight Bearing Restrictions: No      Mobility  Bed Mobility               General bed mobility comments: pt up in chair upon PT arrival  Transfers Overall transfer level: Needs assistance Equipment used: None Transfers: Sit to/from Stand Sit to Stand: Min guard         General transfer comment: min guard for safety  Ambulation/Gait Ambulation/Gait assistance: Min assist;Mod assist Gait Distance (Feet): 200 Feet Assistive device: 1 person hand held assist Gait Pattern/deviations: Step-through pattern;Decreased stride length;Trunk flexed;Narrow base of support Gait velocity: decresaed Gait velocity interpretation: <1.31 ft/sec, indicative of household ambulator General Gait  Details: pt initially used R HHA and then trialed now HHA. Pt with decreased step height and length and significantly less stable without HHA. Pt with 3 episodes of LOB requiring modA to maintain during turns. Pt with lateral sway as well.   Stairs            Wheelchair Mobility    Modified Rankin (Stroke Patients Only) Modified Rankin (Stroke Patients Only) Pre-Morbid Rankin Score: No significant disability Modified Rankin: Moderate disability     Balance Overall balance assessment: Needs assistance Sitting-balance support: Feet supported;No upper extremity supported Sitting balance-Leahy Scale: Good     Standing balance support: Single extremity supported Standing balance-Leahy Scale: Fair Standing balance comment: requires assist for walking to prevent fall                             Pertinent Vitals/Pain Pain Assessment: No/denies pain Faces Pain Scale: No hurt    Home Living Family/patient expects to be discharged to:: Private residence Living Arrangements: Alone Available Help at Discharge: Family Type of Home: House Home Access: Stairs to enter Entrance Stairs-Rails: Can reach both Entrance Stairs-Number of Steps: 2 Home Layout: One level Home Equipment: Grab bars - tub/shower      Prior Function Level of Independence: Independent         Comments: PTA did her yardwork, drove, all ADLs and household chores     Hand Dominance   Dominant Hand: Right    Extremity/Trunk Assessment   Upper Extremity Assessment Upper Extremity Assessment: Defer to OT evaluation    Lower Extremity Assessment Lower Extremity Assessment: Generalized weakness    Cervical /  Trunk Assessment Cervical / Trunk Assessment: Kyphotic  Communication   Communication: Expressive difficulties  Cognition Arousal/Alertness: Awake/alert Behavior During Therapy: Impulsive(mildly) Overall Cognitive Status: Impaired/Different from baseline Area of Impairment:  Safety/judgement                         Safety/Judgement: Decreased awareness of safety;Decreased awareness of deficits     General Comments: pt very adamant about going home stating "I'm not going to fall" despite having episodes of LOB during ambulation      General Comments General comments (skin integrity, edema, etc.): VSS, dtr and granddtr present    Exercises     Assessment/Plan    PT Assessment Patient needs continued PT services  PT Problem List Decreased strength;Decreased activity tolerance;Decreased balance;Decreased mobility;Decreased coordination;Decreased cognition;Decreased knowledge of use of DME;Decreased safety awareness       PT Treatment Interventions      PT Goals (Current goals can be found in the Care Plan section)  Acute Rehab PT Goals Patient Stated Goal: to go home tomorrow PT Goal Formulation: With patient/family Time For Goal Achievement: 11/23/18 Potential to Achieve Goals: Good    Frequency Min 4X/week   Barriers to discharge Decreased caregiver support daughter reports they're looking into hiring 24/7 care for ome    Co-evaluation               AM-PAC PT "6 Clicks" Mobility  Outcome Measure Help needed turning from your back to your side while in a flat bed without using bedrails?: A Little Help needed moving from lying on your back to sitting on the side of a flat bed without using bedrails?: A Little Help needed moving to and from a bed to a chair (including a wheelchair)?: A Little Help needed standing up from a chair using your arms (e.g., wheelchair or bedside chair)?: A Little Help needed to walk in hospital room?: A Little Help needed climbing 3-5 steps with a railing? : A Lot 6 Click Score: 17    End of Session Equipment Utilized During Treatment: Gait belt Activity Tolerance: Patient tolerated treatment well Patient left: in chair;with call bell/phone within reach;with family/visitor present Nurse  Communication: Mobility status PT Visit Diagnosis: Unsteadiness on feet (R26.81)    Time: 1950-9326 PT Time Calculation (min) (ACUTE ONLY): 12 min   Charges:   PT Evaluation $PT Eval Moderate Complexity: 1 Mod          Lewis Shock, PT, DPT Acute Rehabilitation Services Pager #: [phone] Office #: [phone]   Iona Hansen 11/09/2018, 2:08 PM

## 2018-11-09 NOTE — Progress Notes (Signed)
Occupational Therapy Evaluation  PTA, pt was living alone at home with her cat and was independent with ADLs and IADLs including yardwork and driving. Currently, pt required Mod A for lower body ADLs and Mod A for functional mobility. Required min guard for sit <> stand and Mod A for balance and correction of loss of balance. Pt presented with decreased cognition as seen by poor awareness of deficits and perseverated on returning home. Pt was able to don tennis shoes with Min A due to fine motor skill deficits. Pt motivated to participate in therapy and return to PLOF. Overall, the patient tolerated the treatment well and would continue to benefit from skilled OT in the acute setting. Recommended dc is CIR in order for the patient to progress towards her goals and dc recommendation was discussed with daughter and pt at the end of OT session.     11/09/18 1300  OT Visit Information  Last OT Received On 11/09/18  Assistance Needed +1  PT/OT/SLP Co-Evaluation/Treatment Yes  Reason for Co-Treatment To address functional/ADL transfers  OT goals addressed during session ADL's and self-care  History of Present Illness  [name] is a 85 y.o. female past medical history of paroxysmal atrial fibrillation on anticoagulation with Xarelto which had been held for the past 10 days for an eye surgery, hyperlipidemia, presented to [facility] with garbled speech would last known normal at noon on 11/08/2018. CT revealed clot in aortic arch. IV tpa given on 3/9 and then transferred to [facility] hospital.  Precautions  Precautions Fall  Restrictions  Weight Bearing Restrictions No  Home Living  Family/patient expects to be discharged to: Private residence  Living Arrangements Alone  Available Help at Discharge Family  Type of Home House  Home Access Stairs to enter  Entrance Stairs-Number of Steps 2  Entrance Stairs-Rails Can reach both  Home Layout One level  Bathroom Holiday representative Yes  Home Equipment Grab bars - tub/shower   Lives With Alone  Prior Function  Level of Independence Independent  Comments PTA Pt. lives alone and functional in all ADLS and IADLS including yardwork and driving.  Communication  Communication Expressive difficulties;HOH  Pain Assessment  Pain Assessment Faces  Faces Pain Scale 2  Pain Location Generalized  Pain Descriptors / Indicators Discomfort  Pain Intervention(s) Monitored during session  Cognition  Arousal/Alertness Awake/alert  Behavior During Therapy Impulsive  Overall Cognitive Status Impaired/Different from baseline  Area of Impairment Safety/judgement;Awareness;Problem solving;Following commands  Following Commands Follows one step commands consistently;Follows one step commands with increased time  Safety/Judgement Decreased awareness of safety;Decreased awareness of deficits  Awareness Emergent  Problem Solving Slow processing;Requires verbal cues;Difficulty sequencing  General Comments pt very adamant about going home stating "I'm not going to fall" despite having episodes of LOB during ambulation  Upper Extremity Assessment  Upper Extremity Assessment Generalized weakness;RUE deficits/detail  RUE Deficits / Details decreased fine motor skills as seen during tying her shoes  RUE Coordination decreased fine motor  Lower Extremity Assessment  Lower Extremity Assessment Defer to PT evaluation  Cervical / Trunk Assessment  Cervical / Trunk Assessment Kyphotic  ADL  Overall ADL's  Needs assistance/impaired  Eating/Feeding Set up;Sitting  Grooming Brushing hair;Sitting  Upper Body Bathing Set up;Sitting;Supervision/ safety  Lower Body Bathing Moderate assistance;Sit to/from stand  Lower Body Bathing Details (indicate cue type and reason) Mod A for dynamic balance  Upper Body Dressing  Set up;Supervision/safety;Sitting  Lower Body Dressing  Moderate assistance;Sit  to/from stand  Lower Body Dressing Details (indicate cue type and reason) pt. donned tennis shoes with Min A while sitting. Pt. demonstrated poor fine motor skills as seen in tying shoes, Mod A for standing balance  Toilet Transfer Moderate assistance;Ambulation (simulated at recliner)  Functional mobility during ADLs Moderate assistance  General ADL Comments pt. required mod a for balance, reqired Mod A for correction of loss of balance, poor awareness of deficits and perseverated on returning home.   Vision- History  Baseline Vision/History Wears glasses  Wears Glasses Distance only  Patient Visual Report No change from baseline  Vision- Assessment  Vision Assessment? Vision impaired- to be further tested in functional context  Bed Mobility  General bed mobility comments Pt. in recliner upon arrival  Transfers  Overall transfer level Needs assistance  Equipment used None  Transfers Sit to/from Stand  Sit to [employer] guard  General transfer comment min guard for safety  Balance  Overall balance assessment Needs assistance  Sitting-balance support Feet supported  Sitting balance-Leahy Scale Good  Standing balance support Single extremity supported  Standing balance-Leahy Scale Fair  Standing balance comment requires assist for walking to prevent fall  General Comments  General comments (skin integrity, edema, etc.) Daughter and grandaughter arriving end of session, VSS throughout session  OT - End of Session  Activity Tolerance Patient tolerated treatment well  Patient left in chair;with call bell/phone within reach;with family/visitor present  Nurse Communication Mobility status  OT Assessment  OT Recommendation/Assessment Patient needs continued OT Services  OT Visit Diagnosis Unsteadiness on feet (R26.81);Muscle weakness (generalized) (M62.81);Other symptoms and signs involving cognitive function  OT Problem List Decreased strength;Decreased activity tolerance;Impaired balance  (sitting and/or standing);Decreased cognition;Decreased safety awareness  Barriers to Discharge Decreased caregiver support (lives alone)  OT Plan  OT Frequency (ACUTE ONLY) Min 3X/week  OT Treatment/Interventions (ACUTE ONLY) Self-care/ADL training;Therapeutic exercise;Energy conservation;DME and/or AE instruction;Cognitive remediation/compensation;Patient/family education;Balance training  AM-PAC OT "6 Clicks" Daily Activity Outcome Measure (Version 2)  Help from another person eating meals? 4  Help from another person taking care of personal grooming? 3  Help from another person toileting, which includes using toliet, bedpan, or urinal? 3  Help from another person bathing (including washing, rinsing, drying)? 2  Help from another person to put on and taking off regular upper body clothing? 4  Help from another person to put on and taking off regular lower body clothing? 2  6 Click Score 18  OT Recommendation  Recommendations for Other Services Rehab consult;PT consult;Speech consult  Follow Up Recommendations CIR  OT Equipment Other (comment) (defer to next venue)  Individuals Consulted  Consulted and Agree with Results and Recommendations Patient;Family member/caregiver  Family Member Consulted daughter  Acute Rehab OT Goals  Patient Stated Goal To go home, be safe and see her cat.   OT Goal Formulation With patient/family  Time For Goal Achievement 11/23/18  Potential to Achieve Goals Good  OT Time Calculation  OT Start Time (ACUTE ONLY) 1226  OT Stop Time (ACUTE ONLY) 1252  OT Time Calculation (min) 26 min  OT General Charges  $OT Visit 1 Visit  OT Evaluation  $OT Eval Moderate Complexity 1 Mod  Written Expression  Dominant Hand Right   Xuan Mateus MSOT, OTR/L Acute Rehab Pager: [phone] Office: [phone]

## 2018-11-09 NOTE — Evaluation (Signed)
Speech Language Pathology Evaluation Patient Details Name: [name] MRN: [id number] DOB: 06-18-1926 Today's Date: 11/09/2018 Time: 3888-7579 SLP Time Calculation (min) (ACUTE ONLY): 23 min  Problem List:  Patient Active Problem List   Diagnosis Date Noted  . Acute ischemic stroke (HCC) 11/08/2018  . Chaotic atrial rhythm 11/16/2017  . COPD (chronic obstructive pulmonary disease) (HCC) 11/16/2017  . PSVT (paroxysmal supraventricular tachycardia) (HCC) 11/10/2017  . Current use of long term anticoagulation 07/21/2017  . Closed fracture of one rib of right side with routine healing 06/30/2017  . Traumatic pneumothorax 06/30/2017  . First degree atrioventricular block 06/26/2017  . Hammer toe 06/26/2017  . Hyperlipidemia 06/26/2017  . Other specified cardiac arrhythmias 06/26/2017  . Pain in limb 06/26/2017  . Palpitations 06/26/2017  . Paroxysmal atrial fibrillation (HCC) 06/26/2017  . Premature atrial complex 06/26/2017  . Premature ventricular contractions 06/26/2017  . PAT (paroxysmal atrial tachycardia) (HCC) 06/14/2015   Past Medical History:  Past Medical History:  Diagnosis Date  . Closed fracture of one rib of right side with routine healing 06/30/2017  . Current use of long term anticoagulation 07/21/2017  . First degree atrioventricular block 06/26/2017  . Hammer toe 06/26/2017  . Hyperlipidemia 06/26/2017  . Other specified cardiac arrhythmias 06/26/2017  . Pain in limb 06/26/2017  . Palpitations 06/26/2017  . Paroxysmal atrial fibrillation (HCC) 06/26/2017   Overview:  remote history post operatively, CHADS 2 score=2 Overview:  Overview:  remote history post operatively, CHADS 2 score=2  . PAT (paroxysmal atrial tachycardia) (HCC) 06/14/2015  . Premature atrial complex 06/26/2017  . Premature ventricular contractions 06/26/2017  . PSVT (paroxysmal supraventricular tachycardia) (HCC) 11/10/2017  . Traumatic pneumothorax 06/30/2017   Past Surgical History:   Past Surgical History:  Procedure Laterality Date  . ABDOMINAL HYSTERECTOMY    . SHOULDER SURGERY    . TONSILLECTOMY     HPI:  [name] is a 85 y.o. female past medical history of paroxysmal atrial fibrillation on anticoagulation with Xarelto which had been held for the past 10 days for an eye surgery, hyperlipidemia, presented to [facility] with garbled speech/stuttering. CT angio and MRI no acute abnormality, however discovered extensive soft plaque/mural thrombus in distal aortic arch and proximal descending aorta; severe suspected stenosis origin nondominant left vertebral artery.   Assessment / Plan / Recommendation Clinical Impression   Pt was pleasant and engaged throughout cognitive-linguistic evaluation this morning. Given presentation and pt report, she is believed to be at baseline function. Pt reported she is retired and lives alone, however daughter lives near by and manages finances at baseline. Pt performed [facility] for all speech/language and cognitive tasks assessed with the exception of a mild-moderate short term memory deficit evident during delayed recall task; pt spontaneously recalled 1 out of 4 words and required category or multiple choice cues to recall remaining 3. Pt reported short-term memory deficits are consistent with baseline. Pt presents with functional cognitive-linguistic status and will have assistance needed at discharge, thus no acute ST needs are indicated.    SLP Assessment  SLP Recommendation/Assessment: Patient does not need any further Speech Lanaguage Pathology Services SLP Visit Diagnosis: Cognitive communication deficit (R41.841)    Follow Up Recommendations  None    Frequency and Duration           SLP Evaluation Cognition  Overall Cognitive Status: Within Functional Limits for tasks assessed Arousal/Alertness: Awake/alert Orientation Level: Oriented X4 Attention: Sustained Focused Attention: Appears intact Sustained Attention:  Appears intact Memory: Impaired Memory Impairment:  Decreased short term memory Awareness: Appears intact Problem Solving: Appears intact Safety/Judgment: Appears intact       Comprehension  Auditory Comprehension Overall Auditory Comprehension: Appears within functional limits for tasks assessed Yes/No Questions: Not tested Commands: Not tested Conversation: Simple Interfering Components: Hearing EffectiveTechniques: Increased volume Visual Recognition/Discrimination Discrimination: Not tested Reading Comprehension Reading Status: Not tested    Expression Expression Primary Mode of Expression: Verbal Verbal Expression Overall Verbal Expression: Appears within functional limits for tasks assessed Initiation: No impairment Level of Generative/Spontaneous Verbalization: Conversation Repetition: No impairment Naming: Not tested Pragmatics: No impairment Written Expression Dominant Hand: Right Written Expression: Not tested   Oral / Motor  Oral Motor/Sensory Function Overall Oral Motor/Sensory Function: Within functional limits Motor Speech Overall Motor Speech: Appears within functional limits for tasks assessed Respiration: Within functional limits Phonation: Normal Resonance: Within functional limits Articulation: Within functional limitis Intelligibility: Intelligible Motor Planning: Witnin functional limits Motor Speech Errors: Not applicable Interfering Components: Hearing loss   Suzzette Righter, Student SLP                     Suzzette Righter 11/09/2018, 9:11 AM

## 2018-11-10 MED ORDER — RIVAROXABAN 15 MG PO TABS: 15.0000 mg | ORAL_TABLET | Freq: Every day | ORAL | 3 refills | Status: DC

## 2018-11-10 NOTE — Discharge Summary (Signed)
Stroke Discharge Summary  Patient ID: [name]   MRN: [id number]      DOB: Feb 18, 1926  Date of Admission: 11/08/2018 Date of Discharge: 11/10/2018  Attending Physician:  Marvel Plan, MD, Stroke MD Consultant(s):    None  Patient's PCP:  Abigail Miyamoto, MD  DISCHARGE DIAGNOSIS:  Left MCA stroke s/p tPA due to afib off Xarelto  Active Problems:   Acute ischemic stroke (HCC)   Chronic Afib   HLD   Past Medical History:  Diagnosis Date  . Closed fracture of one rib of right side with routine healing 06/30/2017  . Current use of long term anticoagulation 07/21/2017  . First degree atrioventricular block 06/26/2017  . Hammer toe 06/26/2017  . Hyperlipidemia 06/26/2017  . Other specified cardiac arrhythmias 06/26/2017  . Pain in limb 06/26/2017  . Palpitations 06/26/2017  . Paroxysmal atrial fibrillation (HCC) 06/26/2017   Overview:  remote history post operatively, CHADS 2 score=2 Overview:  Overview:  remote history post operatively, CHADS 2 score=2  . PAT (paroxysmal atrial tachycardia) (HCC) 06/14/2015  . Premature atrial complex 06/26/2017  . Premature ventricular contractions 06/26/2017  . PSVT (paroxysmal supraventricular tachycardia) (HCC) 11/10/2017  . Traumatic pneumothorax 06/30/2017   Past Surgical History:  Procedure Laterality Date  . ABDOMINAL HYSTERECTOMY    . SHOULDER SURGERY    . TONSILLECTOMY       LABORATORY STUDIES CBC    Component Value Date/Time   WBC 5.6 11/09/2018 0512   RBC 3.19 (L) 11/09/2018 0512   HGB 10.5 (L) 11/09/2018 0512   HCT 32.3 (L) 11/09/2018 0512   PLT 183 11/09/2018 0512   MCV 101.3 (H) 11/09/2018 0512   MCH 32.9 11/09/2018 0512   MCHC 32.5 11/09/2018 0512   RDW 14.1 11/09/2018 0512   CMP    Component Value Date/Time   NA 138 11/09/2018 0512   K 3.9 11/09/2018 0512   CL 109 11/09/2018 0512   CO2 23 11/09/2018 0512   GLUCOSE 85 11/09/2018 0512   BUN 12 11/09/2018 0512   CREATININE 0.75 11/09/2018 0512    CALCIUM 8.3 (L) 11/09/2018 0512   GFRNONAA >60 11/09/2018 0512   GFRAA >60 11/09/2018 0512   COAGSNo results found for: INR, PROTIME Lipid Panel    Component Value Date/Time   CHOL 181 11/09/2018 0512   TRIG 57 11/09/2018 0512   HDL 42 11/09/2018 0512   CHOLHDL 4.3 11/09/2018 0512   VLDL 11 11/09/2018 0512   LDLCALC 128 (H) 11/09/2018 0512   HgbA1C  Lab Results  Component Value Date   HGBA1C 5.3 11/09/2018   Urinalysis No results found for: COLORURINE, APPEARANCEUR, LABSPEC, PHURINE, GLUCOSEU, HGBUR, BILIRUBINUR, KETONESUR, PROTEINUR, UROBILINOGEN, NITRITE, LEUKOCYTESUR Urine Drug Screen No results found for: LABOPIA, COCAINSCRNUR, LABBENZ, AMPHETMU, THCU, LABBARB  Alcohol Level No results found for: [facility]   SIGNIFICANT DIAGNOSTIC STUDIES Mr Brain Wo Contrast  Result Date: 11/09/2018 CLINICAL DATA:  Stroke follow-up. Status post tPA. History of atrial fibrillation, on Xarelto. EXAM: MRI HEAD WITHOUT CONTRAST TECHNIQUE: Multiplanar, multiecho pulse sequences of the brain and surrounding structures were obtained without intravenous contrast. COMPARISON:  Brain MRI 06/07/2018 FINDINGS: BRAIN: There is a small area cortical diffusion restriction along the left postcentral gyrus. There is no acute hemorrhage. The midline structures are normal. No midline shift or other mass effect. Multifocal white matter hyperintensity, most commonly due to chronic ischemic microangiopathy. Generalized atrophy without lobar predilection. Susceptibility-sensitive sequences show no chronic microhemorrhage or superficial siderosis. VASCULAR: Major intracranial  arterial and venous sinus flow voids are normal. SKULL AND UPPER CERVICAL SPINE: Calvarial bone marrow signal is normal. There is no skull base mass. Visualized upper cervical spine and soft tissues are normal. SINUSES/ORBITS: No fluid levels or advanced mucosal thickening. No mastoid or middle ear effusion. The orbits are normal. IMPRESSION: 1. Small  focus of acute ischemia within the left postcentral gyrus. No hemorrhage or mass effect. 2. Generalized volume loss and findings of chronic ischemic microangiopathy. Electronically Signed   By: Deatra Robinson M.D.   On: 11/09/2018 14:50   CT head no acute abnormalities  CTA hea dand neck - aortic atherosclerosis with soft plaques, nondominant left VA origin stenosis    HISTORY OF PRESENT ILLNESS  [name] is a 85 y.o. female past medical history of paroxysmal atrial fibrillation on anticoagulation with Xarelto which had been held for the past 10 days for an eye surgery, hyperlipidemia, presented to [facility] with garbled speech would last known normal at noon on 11/08/2018.  According to notes, she had slurred speech and was not acting right upon arrival of EMS and the family reported that the stuttering had been getting worse and en route to the hospital stuttering worsened even more.  She was evaluated by telemedicine neurology and was given IV TPA for presumed stroke after a noncontrast CT of the head did not show any evidence of bleed.  A CT angiogram head and neck was also performed to look for any evidence of large vessel occlusion and was negative for large vessel occlusion in the anterior posterior circulation but it did find a extensive soft plaque/mural thrombus in the distal aortic arch and including proximal descending aorta.  There was also severe suspected stenosis of the origin of the nondominant left vertebral artery.  Mild cervical carotid artery atherosclerosis without significant stenosis.  Minimal intracranial atherosclerosis without major branch occlusion or significant stenosis was also seen.  An incidental note was made of 1.6 cm right thyroid nodule.  She was [facility]  Patient reports that her speech now is clear than it was before.  She denies any tingling numbness weakness.  Denies any  headaches.  Denies any visual symptoms. She denies any palpitations or chest pain.  She denies any shortness of breath or cough.  She denies any bleeding or clotting tendencies.  She denies any abdominal pain nausea vomiting.  She denies any anxiety depression at this time but is concerned about her stroke.  Neurology note NIHSS-7 - 1 for-month and age, 1 for right arm drift, 2 for partial lower facial palsy, 2 for aphasia, 1 for dysarthria  HOSPITAL COURSE Ms. [name] is a 85 y.o. female with history of HLD and AFib off Xarelto for 9 days in preparation of left eyelid surgery admitted for speech difficulty and RUE weakness. TPA given 1515 on 11/08/18.    Stroke:  left MCA infarct embolic secondary to Afib off Xarelto for potential right eyelid surgery  Resultant back to baseline  CT no acute abnormality  MRI Small focus of acute ischemia within the left postcentral gyrus.  CTA head and neck aortic atherosclerosis with soft plaques, nondominant left VA origin stenosis  2D Echo EF 55-60%  LDL 128  HgbA1c 5.3  SCDs for VTE prophylaxis  On diet  Xarelto on hold prior to admission, now on Xarelto 15mg  daily.  Ongoing aggressive stroke risk factor management  Therapy recommendations: home with  24h supervision   Disposition:  home today, pt refused SNF and determined to go home. We set up home heath PT/OT/RN/home adie/SW for her. Daughter is able to secure 48hr constant supervision for her at home.   AF, chronic  Was on Xarelto but on hold for potential right eyelid surgery  Rate controlled  Resumed Xarelto but put on correct dosing of  daily  Resumed acebutolol  BP management  Stable  Permissive hypertension (OK if <180/105) for post tPA.  Long term BP goal normotensive  Resumed home acebutolol  Hyperlipidemia  Home meds:  Pravastatin 40   LDL 128, goal < 70  Now on pravastatin 40  Continue statin at discharge  Other Stroke Risk  Factors  Advanced age  Other Active Problems  Right eyelid drooping - no change with time during the day - surgery cancelled at this time   DISCHARGE EXAM Blood pressure (!) 131/99, pulse 90, temperature 98.1 F (36.7 C), temperature source Oral, resp. rate 16, height  (1.6 m), weight 47.2 kg, SpO2 99 %.  General - Well nourished, well developed, in no apparent distress.  Ophthalmologic - fundi not visualized due to noncooperation.  Cardiovascular - irregularly irregular heart rate and rhythm..  Mental Status -  Level of arousal and orientation to time, place, and person were intact. Language including expression, naming, repetition, comprehension was assessed and found intact. Fund of Knowledge was assessed and was intact.  Cranial Nerves II - XII - II - Visual field intact OU. III, IV, VI - Extraocular movements intact. V - Facial sensation intact bilaterally. VII - Facial movement intact bilaterally. VIII - Hearing & vestibular intact bilaterally. X - Palate elevates symmetrically. XI - Chin turning & shoulder shrug intact bilaterally. XII - Tongue protrusion intact.  Motor Strength - The patient's strength was normal in all extremities and pronator drift was absent.  Bulk was normal and fasciculations were absent.   Motor Tone - Muscle tone was assessed at the neck and appendages and was normal.  Reflexes - The patient's reflexes were symmetrical in all extremities and she had no pathological reflexes.  Sensory - Light touch, temperature/pinprick were assessed and were symmetrical.    Coordination - The patient had normal movements in the hands and feet with no ataxia or dysmetria.  Tremor was absent.  Gait and Station - deferred.   Discharge Diet    Diet Order            Diet regular Room service appropriate? Yes; Fluid consistency: Thin  Diet effective now             liquids  DISCHARGE PLAN  Disposition:  Home with supervision and home  PT/OT/RN/home aide/SW  Xarelto (rivaroxaban) daily for secondary stroke prevention.  Ongoing risk factor control by Primary Care Physician at time of discharge  Follow-up Abigail Miyamoto, MD in 1 week.  Follow-up in [facility] in 4 weeks, office to schedule an appointment.   35 minutes were spent preparing discharge.  Marvel Plan, MD PhD Stroke Neurology 11/10/2018 1:31 PM

## 2018-11-10 NOTE — Discharge Instructions (Signed)
Please take the medications as prescribed. We have resumed your Xarelto and dose is now at 15mg  daily We recommend you have 24/7 supervision at home for at least 2 days. Encourage intermittent supervision after.  We have ordered home health PT, OT, home nurse, home aide and social worker for you at home to help you out and provide therapy You will follow up with stroke clinic at [facility] in 4 weeks. We will call you for the appointment.  Follow up with your PCP in one week. Avoid fall at home, be cautions on walking.  Bleeding precautions, call your PCP or 911 if severe bleeding occurs.   Information on my medicine - XARELTO (Rivaroxaban)  Why was Xarelto prescribed for you? Xarelto was prescribed for you to reduce the risk of a blood clot forming that can cause a stroke if you have a medical condition called atrial fibrillation (a type of irregular heartbeat).  What do you need to know about xarelto ? Take your Xarelto ONCE DAILY at the same time every day with your evening meal. If you have difficulty swallowing the tablet whole, you may crush it and mix in applesauce just prior to taking your dose.  Take Xarelto exactly as prescribed by your doctor and DO NOT stop taking Xarelto without talking to the doctor who prescribed the medication.  Stopping without other stroke prevention medication to take the place of Xarelto may increase your risk of developing a clot that causes a stroke.  Refill your prescription before you run out.  After discharge, you should have regular check-up appointments with your healthcare provider that is prescribing your Xarelto.  In the future your dose may need to be changed if your kidney function or weight changes by a significant amount.  What do you do if you miss a dose? If you are taking Xarelto ONCE DAILY and you miss a dose, take it as soon as you remember on the same day then continue your regularly scheduled once daily regimen the next day. Do not  take two doses of Xarelto at the same time or on the same day.   Important Safety Information A possible side effect of Xarelto is bleeding. You should call your healthcare provider right away if you experience any of the following: ? Bleeding from an injury or your nose that does not stop. ? Unusual colored urine (red or dark brown) or unusual colored stools (red or black). ? Unusual bruising for unknown reasons. ? A serious fall or if you hit your head (even if there is no bleeding).  Some medicines may interact with Xarelto and might increase your risk of bleeding while on Xarelto. To help avoid this, consult your healthcare provider or pharmacist prior to using any new prescription or non-prescription medications, including herbals, vitamins, non-steroidal anti-inflammatory drugs (NSAIDs) and supplements.  This website has more information on Xarelto: VisitDestination.com.br.

## 2018-11-10 NOTE — Progress Notes (Signed)
Physical Therapy Treatment Patient Details Name: [name] MRN: [id number] DOB: August 21, 1926 Today's Date: 11/10/2018    History of Present Illness  [name] is a 85 y.o. female past medical history of paroxysmal atrial fibrillation on anticoagulation with Xarelto which had been held for the past 10 days for an eye surgery, hyperlipidemia, presented to [facility] with garbled speech would last known normal at noon on 11/08/2018. CT revealed clot in aortic arch. IV tpa given on 3/9 and then transferred to [facility] hospital.    PT Comments    Continuing work on functional mobility and activity tolerance;  Walked hallway today without assistive device, HHA as needed; still with balance deficits, erratic step width, a few episodes of loss of balance; still with quite a fall risk; Noted CIR is not an option at this time; REcommend SNF for post-acute rehab to maximize independence and safety with mobility prior to dc home; Pt's daughter is looking into longer term help for home, and she is interested in talking with Case Mgr and SW  Follow Up Recommendations  Supervision/Assistance - 24 hour;SNF     Equipment Recommendations  Other (comment)(will consider cane vs RW -- can be determined at next venue)    Recommendations for Other Services       Precautions / Restrictions Precautions Precautions: Fall    Mobility  Bed Mobility               General bed mobility comments: pt up in chair upon PT arrival  Transfers Overall transfer level: Needs assistance Equipment used: None Transfers: Sit to/from Stand Sit to Stand: Min guard         General transfer comment: min guard for safety  Ambulation/Gait Ambulation/Gait assistance: Min assist Gait Distance (Feet): 200 Feet Assistive device: 1 person hand held assist Gait Pattern/deviations: Step-through pattern;Decreased stride length;Staggering left;Staggering right;Narrow base of support Gait velocity: decresaed    General Gait Details: Walks with erratic step width, indicative of decr stability in stance R and LLEs; a few episodes of loss of balance; employed stepping balance strategy to regain footing, and required min assist; continues to present with fall risk   Social research officer, government Rankin (Stroke Patients Only) Modified Rankin (Stroke Patients Only) Pre-Morbid Rankin Score: No significant disability Modified Rankin: Moderate disability     Balance Overall balance assessment: Needs assistance Sitting-balance support: Feet supported;No upper extremity supported Sitting balance-Leahy Scale: Good       Standing balance-Leahy Scale: Fair Standing balance comment: requires assist for walking to prevent fall                            Cognition Arousal/Alertness: Awake/alert Behavior During Therapy: Impulsive(mildly) Overall Cognitive Status: Impaired/Different from baseline Area of Impairment: Safety/judgement                       Following Commands: Follows one step commands consistently;Follows one step commands with increased time Safety/Judgement: Decreased awareness of safety;Decreased awareness of deficits Awareness: Emergent Problem Solving: Slow processing;Requires verbal cues;Difficulty sequencing        Exercises      General Comments General comments (skin integrity, edema, etc.): Lengthy discussion with daughter re: dc options      Pertinent Vitals/Pain Pain Assessment: No/denies pain    Home Living  Prior Function            PT Goals (current goals can now be found in the care plan section) Acute Rehab PT Goals Patient Stated Goal: wants to see her cat PT Goal Formulation: With patient/family Time For Goal Achievement: 11/23/18 Potential to Achieve Goals: Good Progress towards PT goals: Progressing toward goals    Frequency    Min 4X/week      PT Plan  Discharge plan needs to be updated    Co-evaluation              AM-PAC PT "6 Clicks" Mobility   Outcome Measure  Help needed turning from your back to your side while in a flat bed without using bedrails?: None Help needed moving from lying on your back to sitting on the side of a flat bed without using bedrails?: A Little Help needed moving to and from a bed to a chair (including a wheelchair)?: A Little Help needed standing up from a chair using your arms (e.g., wheelchair or bedside chair)?: A Little Help needed to walk in hospital room?: A Little Help needed climbing 3-5 steps with a railing? : A Lot 6 Click Score: 18    End of Session Equipment Utilized During Treatment: Gait belt Activity Tolerance: Patient tolerated treatment well Patient left: in chair;with call bell/phone within reach;with chair alarm set;with family/visitor present Nurse Communication: Mobility status PT Visit Diagnosis: Unsteadiness on feet (R26.81)     Time: 0850(in time is approx)-0910 PT Time Calculation (min) (ACUTE ONLY): 20 min  Charges:  $Gait Training: 8-22 mins                     Van Clines, PT  Acute Rehabilitation Services Pager [phone] Office [phone]    Levi Aland 11/10/2018, 9:27 AM

## 2018-11-10 NOTE — Progress Notes (Signed)
IV and tele discontinued without complication, patient education provided to patient and daughter [name]. Patient discharged from facility via wheelchair.

## 2018-11-10 NOTE — TOC Transition Note (Signed)
Transition of Care [facility]) - CM/SW Discharge Note   Patient Details  Name: [name] MRN: [id number] Date of Birth: Jul 19, 1926  Transition of Care [facility]) CM/SW Contact:  Kermit Balo, RN Phone Number: 11/10/2018, 2:46 PM   Clinical Narrative:       Final next level of care: Home w Home Health Services Barriers to Discharge: No Barriers Identified   Patient Goals and CMS Choice Patient states their goals for this hospitalization and ongoing recovery are:: just want to get back home CMS Medicare.gov Compare Post Acute Care list provided to:: Patient Choice offered to / list presented to : Patient  Discharge Placement                       Discharge Plan and Services Discharge Planning Services: CM Consult Post Acute Care Choice: Home Health          DME Arranged: 3-N-1, Gilmer Mor DME Agency: [facility] HH Arranged: RN, PT, OT, Nurse's Aide, Social Work [employer] Agency: [facility](Ellen with [facility] accepted the referral)   Social Determinants of Health (SDOH) Interventions     Readmission Risk Interventions No flowsheet data found.

## 2018-11-10 NOTE — TOC Initial Note (Signed)
Transition of Care [facility]) - Initial/Assessment Note    Patient Details  Name: [name] MRN: [id number] Date of Birth: [birth year]  Transition of Care [facility]) CM/SW Contact:    Kermit Balo, RN Phone Number: 11/10/2018, 2:46 PM  Clinical Narrative:                   Expected Discharge Plan: Home w Home Health Services Barriers to Discharge: No Barriers Identified   Patient Goals and CMS Choice Patient states their goals for this hospitalization and ongoing recovery are:: just want to get back home CMS Medicare.gov Compare Post Acute Care list provided to:: Patient Choice offered to / list presented to : Patient  Expected Discharge Plan and Services Expected Discharge Plan: Home w Home Health Services Discharge Planning Services: CM Consult Post Acute Care Choice: Home Health Living arrangements for the past 2 months: Single Family Home Expected Discharge Date: 11/10/18               DME Arranged: Cammie Sickle DME Agency: [facility] HH Arranged: RN, PT, OT, Nurse's Aide, Social Work [employer] Agency: [facility](Ellen with [facility] accepted the referral)  Prior Living Arrangements/Services Living arrangements for the past 2 months: Single Family Home Lives with:: Self Patient language and need for interpreter reviewed:: Yes(none) Do you feel safe going back to the place where you live?: Yes      Need for Family Participation in Patient Care: Yes (Comment)(daughter) Care giver support system in place?: Yes (comment)(daughter arranging caregivers for at least next few days) Current home services: DME(3 prong cane and walker) Criminal Activity/Legal Involvement Pertinent to Current Situation/Hospitalization: No - Comment as needed  Activities of Daily Living Home Assistive Devices/Equipment: Grab bars in shower ADL Screening (condition at time of admission) Patient's cognitive ability adequate to safely complete daily activities?: Yes Is the patient deaf or have  difficulty hearing?: Yes Does the patient have difficulty seeing, even when wearing glasses/contacts?: Yes Does the patient have difficulty concentrating, remembering, or making decisions?: Yes Patient able to express need for assistance with ADLs?: Yes Does the patient have difficulty dressing or bathing?: No Independently performs ADLs?: Yes (appropriate for developmental age) Does the patient have difficulty walking or climbing stairs?: No Weakness of Legs: None Weakness of Arms/Hands: None  Permission Sought/Granted Permission sought to share information with : Facility Industrial/product designer granted to share information with : Yes, Verbal Permission Granted  Share Information with NAME: [name]     Permission granted to share info w Relationship: daughter  Permission granted to share info w Contact Information: [phone]  Emotional Assessment Appearance:: Appears stated age Attitude/Demeanor/Rapport: Gracious Affect (typically observed): Accepting, Appropriate Orientation: : Oriented to Self, Oriented to Place, Oriented to  Time, Oriented to Situation Alcohol / Substance Use: Not Applicable Psych Involvement: No (comment)  Admission diagnosis:  CVA  Patient Active Problem List   Diagnosis Date Noted  . Acute ischemic stroke (HCC) 11/08/2018  . Chaotic atrial rhythm 11/16/2017  . COPD (chronic obstructive pulmonary disease) (HCC) 11/16/2017  . PSVT (paroxysmal supraventricular tachycardia) (HCC) 11/10/2017  . Current use of long term anticoagulation 07/21/2017  . Closed fracture of one rib of right side with routine healing 06/30/2017  . Traumatic pneumothorax 06/30/2017  . First degree atrioventricular block 06/26/2017  . Hammer toe 06/26/2017  . Hyperlipidemia 06/26/2017  . Other specified cardiac arrhythmias 06/26/2017  . Pain in limb 06/26/2017  . Palpitations 06/26/2017  . Paroxysmal atrial fibrillation (HCC) 06/26/2017  .  Premature atrial complex  06/26/2017  . Premature ventricular contractions 06/26/2017  . PAT (paroxysmal atrial tachycardia) (HCC) 06/14/2015   PCP:  Abigail Miyamoto, MD Pharmacy:   [facility], [place] - [address] [place] [address] Phone: [phone] Fax: [phone]     Social Determinants of Health (SDOH) Interventions    Readmission Risk Interventions 30 Day Unplanned Readmission Risk Score     Admission (Current) from 11/08/2018 in [facility]  30 Day Unplanned Readmission Risk Score (%)  9 Filed at 11/10/2018 1200     This score is the patient's risk of an unplanned readmission within 30 days of being discharged (0 -100%). The score is based on dignosis, age, lab data, medications, orders, and past utilization.   Low:  0-14.9   Medium: 15-21.9   High: 22-29.9   Extreme: 30 and above       No flowsheet data found.

## 2018-11-11 DIAGNOSIS — I69391 Dysphagia following cerebral infarction: Secondary | ICD-10-CM | POA: Diagnosis not present

## 2018-11-11 DIAGNOSIS — I440 Atrioventricular block, first degree: Secondary | ICD-10-CM | POA: Diagnosis not present

## 2018-11-11 DIAGNOSIS — E785 Hyperlipidemia, unspecified: Secondary | ICD-10-CM | POA: Diagnosis not present

## 2018-11-11 DIAGNOSIS — I482 Chronic atrial fibrillation, unspecified: Secondary | ICD-10-CM | POA: Diagnosis not present

## 2018-11-11 DIAGNOSIS — D649 Anemia, unspecified: Secondary | ICD-10-CM | POA: Diagnosis not present

## 2018-11-11 DIAGNOSIS — R1310 Dysphagia, unspecified: Secondary | ICD-10-CM | POA: Diagnosis not present

## 2018-11-11 DIAGNOSIS — J449 Chronic obstructive pulmonary disease, unspecified: Secondary | ICD-10-CM | POA: Diagnosis not present

## 2018-11-11 DIAGNOSIS — Z9181 History of falling: Secondary | ICD-10-CM | POA: Diagnosis not present

## 2018-11-11 DIAGNOSIS — I69322 Dysarthria following cerebral infarction: Secondary | ICD-10-CM | POA: Diagnosis not present

## 2018-11-11 DIAGNOSIS — I471 Supraventricular tachycardia: Secondary | ICD-10-CM | POA: Diagnosis not present

## 2018-11-11 DIAGNOSIS — Z7901 Long term (current) use of anticoagulants: Secondary | ICD-10-CM | POA: Diagnosis not present

## 2018-11-11 DIAGNOSIS — I491 Atrial premature depolarization: Secondary | ICD-10-CM | POA: Diagnosis not present

## 2018-11-11 DIAGNOSIS — M2040 Other hammer toe(s) (acquired), unspecified foot: Secondary | ICD-10-CM | POA: Diagnosis not present

## 2018-11-11 DIAGNOSIS — I69351 Hemiplegia and hemiparesis following cerebral infarction affecting right dominant side: Secondary | ICD-10-CM | POA: Diagnosis not present

## 2018-11-15 NOTE — Progress Notes (Signed)
Cardiology Office Note:    Date:  11/16/2018   ID:  [name], DOB [birth year], MRN [id number]  PCP:  Abigail Miyamoto, MD  Cardiologist:  Norman Herrlich, MD    Referring MD: Abigail Miyamoto,*    ASSESSMENT:    1. Persistent atrial fibrillation   2. Paroxysmal atrial fibrillation (HCC)   3. Current use of long term anticoagulation    PLAN:    Greater than 50% of the visit time of 45 minutes was spent and counseling and care coordination specifically the issue of atrial fibrillation rate control versus maintenance of sinus rhythm benefit and risk of cardioversion and the necessity of continuous anticoagulation without interruption.  The patient and her daughter felt that the recommendation the hospitalist for cardioversion was in sinus rhythm after discussion and their understanding that she be at increased risk of stroke with cardioversion despite anticoagulation everyone is comfortable maintaining atrial fibrillation rate control second question is whether rate control is effective the daughter will purchase an iPhone adapter and do screening EKGs with a goal of heart rate of 80-110 at rest.  If any event arose that she could be anticoagulated other endeavors such as left atrial occlusion-watchman or lariat procedure would be appropriate.  After discussion of her stroke extreme risk of recurrence benefits of maintaining rate control versus anticoagulation and plans in the future to avoid interruptions in anticoagulation patient and her daughter understood the complex issues involved and agreed with plan of care we created.  In order of problems listed above:  1. Her atrial fibrillation is persistent rate controlled she is anticoagulated and certainly would not attempt cardioversion because of her transiently increase her risk of recurrent stroke.  I sought to reassure the family her rate seems controlled on a low-dose beta-blocker her daughter will purchase the [employer]cor smart  phone adapter to record her mother's heart rhythm and contact me if she is consistently greater than 100 -110 at rest.  I asked her to hold her morning activities for several hours after taking her beta-blocker is really the time where intermittently she has palpitation after doing housework making the bed and other activities. 2. Continue her anticoagulant I certainly would not interrupt in the future if at all possible as it is uncommon to have a stroke with brief interruption of anticoagulants does me that her risk of recurrence is very very high   Next appointment: 6 months   Medication Adjustments/Labs and Tests Ordered: Current medicines are reviewed at length with the patient today.  Concerns regarding medicines are outlined above.  No orders of the defined types were placed in this encounter.  No orders of the defined types were placed in this encounter.   Chief Complaint  Patient presents with   Follow-up    after recent stroke with PAF    History of Present Illness:    [name] is a 85 y.o. female with a hx of paroxysmal atrial fibrillation anticoagulated PAT, Dyslipidemia, COPD  last seen 07/15/18.  She had a recent stroke 11/08/2018 left middle cerebral artery secondary atrial fibrillation and patient was withdrawn from Xarelto for eye surgery.  Echocardiogram 11/09/2018 showed normal left ventricular size and function patient was in atrial fibrillation moderate left atrial enlargement moderate right atrial enlargement moderate tricuspid regurgitation.  Lipid profile 11/09/18 shows a cholesterol 181 HDL 42 LDL 128 hemoglobin 37.1 creatinine 0.75 GFR greater than 60.  CTA of chest showed extensive mural thrombus and atherosclerosis in the distal arch and  descending thoracic aorta.  There is an indication the cardiologist to be consulted but the patient was not seen in the hospital and the CTA report is not in the chart presumably done at [facility]. Compliance with diet,  lifestyle and medications: Yes  She has made a good transition at home and is being supervised by her daughter and has home health care but is living independently.  She is somewhat unsteady with her gait since she had the episode and encouraged him to use a cane.  She has palpitation at times after she does her morning activities like making the bed light housework and have asked her to try to wait 1 to 2 hours after taking her beta-blocker in the morning so that it takes effect.  No shortness of breath from breath edema syncope or TIA no bleeding complication of her anticoagulant.  Her EKG today shows rate controlled atrial fibrillation Past Medical History:  Diagnosis Date   Closed fracture of one rib of right side with routine healing 06/30/2017   Current use of long term anticoagulation 07/21/2017   First degree atrioventricular block 06/26/2017   Hammer toe 06/26/2017   Hyperlipidemia 06/26/2017   Other specified cardiac arrhythmias 06/26/2017   Pain in limb 06/26/2017   Palpitations 06/26/2017   Paroxysmal atrial fibrillation (HCC) 06/26/2017   Overview:  remote history post operatively, CHADS 2 score=2 Overview:  Overview:  remote history post operatively, CHADS 2 score=2   PAT (paroxysmal atrial tachycardia) (HCC) 06/14/2015   Premature atrial complex 06/26/2017   Premature ventricular contractions 06/26/2017   PSVT (paroxysmal supraventricular tachycardia) (HCC) 11/10/2017   Stroke (HCC)    Traumatic pneumothorax 06/30/2017    Past Surgical History:  Procedure Laterality Date   ABDOMINAL HYSTERECTOMY     SHOULDER SURGERY     TONSILLECTOMY      Current Medications: Current Meds  Medication Sig   acebutolol (SECTRAL) 200 MG capsule Take 1 capsule (200 mg total) by mouth daily.   Cholecalciferol (VITAMIN D3) 1000 units CAPS Take 1,000 Units by mouth daily.   mirtazapine (REMERON) 15 MG tablet Take 15 mg by mouth daily.   Multiple Vitamins-Minerals  (PRESERVISION AREDS 2 PO) Take 1 tablet by mouth daily.   Polyethylene Glycol 3350 (PEG 3350) POWD Take 17 g by mouth daily as needed (bowel movement).    Polyvinyl Alcohol-Povidone (REFRESH OP) Place 1 drop into both eyes See admin instructions. Insert 1 drop into both eyes 4-5 times daily as needed for dry eyes   pravastatin (PRAVACHOL) 40 MG tablet Take 40 mg by mouth daily.   Rivaroxaban (XARELTO) 15 MG TABS tablet Take 1 tablet (15 mg total) by mouth daily with supper.   vitamin B-12 (CYANOCOBALAMIN) 1000 MCG tablet Take 1,000 mcg by mouth daily.     Allergies:   Codeine; Penicillins; and Sulfa antibiotics   Social History   Socioeconomic History   Marital status: Single    Spouse name: Not on file   Number of children: Not on file   Years of education: Not on file   Highest education level: Not on file  Occupational History   Not on file  Social Needs   Financial resource strain: Not on file   Food insecurity:    Worry: Not on file    Inability: Not on file   Transportation needs:    Medical: Not on file    Non-medical: Not on file  Tobacco Use   Smoking status: Never Smoker   Smokeless tobacco:  Never Used  Substance and Sexual Activity   Alcohol use: No    Frequency: Never   Drug use: No   Sexual activity: Not on file  Lifestyle   Physical activity:    Days per week: Not on file    Minutes per session: Not on file   Stress: Not on file  Relationships   Social connections:    Talks on phone: Not on file    Gets together: Not on file    Attends religious service: Not on file    Active member of club or organization: Not on file    Attends meetings of clubs or organizations: Not on file    Relationship status: Not on file  Other Topics Concern   Not on file  Social History Narrative   Not on file     Family History: The patient's family history includes Bone cancer in her mother; Breast cancer in her father and sister; Diabetes in her  father; Heart disease in her father. ROS:   Please see the history of present illness.    All other systems reviewed and are negative.  EKGs/Labs/Other Studies Reviewed:    The following studies were reviewed today:  EKG:  EKG ordered today and personally reviewed.  The ekg ordered today demonstrates rate controlled atrial fibrillation  Recent Labs: 11/09/2018: BUN 12; Creatinine, Ser 0.75; Hemoglobin 10.5; Platelets 183; Potassium 3.9; Sodium 138  Recent Lipid Panel    Component Value Date/Time   CHOL 181 11/09/2018 0512   TRIG 57 11/09/2018 0512   HDL 42 11/09/2018 0512   CHOLHDL 4.3 11/09/2018 0512   VLDL 11 11/09/2018 0512   LDLCALC 128 (H) 11/09/2018 0512    Physical Exam:    VS:  BP 110/72 (BP Location: Right Arm, Patient Position: Sitting, Cuff Size: Normal)    Pulse 77    Ht 5\' 3"  (1.6 m)    Wt 107 lb 6.4 oz (48.7 kg)    SpO2 98%    BMI 19.03 kg/m     Wt Readings from Last 3 Encounters:  11/16/18 107 lb 6.4 oz (48.7 kg)  11/09/18 104 lb 0.9 oz (47.2 kg)  07/15/18 106 lb 12.8 oz (48.4 kg)     GEN: She looks frail well nourished, well developed in no acute distress HEENT: Normal NECK: No JVD; No carotid bruits LYMPHATICS: No lymphadenopathy CARDIAC: Irregular rhythm variable first heart sound  no murmurs, rubs, gallops RESPIRATORY:  Clear to auscultation without rales, wheezing or rhonchi  ABDOMEN: Soft, non-tender, non-distended MUSCULOSKELETAL:  No edema; No deformity  SKIN: Warm and dry NEUROLOGIC:  Alert and oriented x 3 PSYCHIATRIC:  Normal affect    Signed, Norman Herrlich, MD  11/16/2018 11:08 AM    [facility]

## 2018-11-16 ENCOUNTER — Other Ambulatory Visit: Payer: Self-pay | Admitting: *Deleted

## 2018-11-16 ENCOUNTER — Encounter: Payer: Self-pay | Admitting: Cardiology

## 2018-11-16 ENCOUNTER — Ambulatory Visit (INDEPENDENT_AMBULATORY_CARE_PROVIDER_SITE_OTHER): Payer: PPO | Admitting: Cardiology

## 2018-11-16 ENCOUNTER — Other Ambulatory Visit: Payer: Self-pay

## 2018-11-16 VITALS — BP 110/72 | HR 77 | Ht 63.0 in | Wt 107.4 lb

## 2018-11-16 DIAGNOSIS — I480 Paroxysmal atrial fibrillation: Secondary | ICD-10-CM | POA: Diagnosis not present

## 2018-11-16 DIAGNOSIS — I69313 Psychomotor deficit following cerebral infarction: Secondary | ICD-10-CM | POA: Diagnosis not present

## 2018-11-16 DIAGNOSIS — I4819 Other persistent atrial fibrillation: Secondary | ICD-10-CM | POA: Diagnosis not present

## 2018-11-16 DIAGNOSIS — I69390 Apraxia following cerebral infarction: Secondary | ICD-10-CM | POA: Diagnosis not present

## 2018-11-16 DIAGNOSIS — Z681 Body mass index (BMI) 19 or less, adult: Secondary | ICD-10-CM | POA: Diagnosis not present

## 2018-11-16 DIAGNOSIS — Z7901 Long term (current) use of anticoagulants: Secondary | ICD-10-CM | POA: Diagnosis not present

## 2018-11-16 DIAGNOSIS — I482 Chronic atrial fibrillation, unspecified: Secondary | ICD-10-CM | POA: Diagnosis not present

## 2018-11-16 DIAGNOSIS — E440 Moderate protein-calorie malnutrition: Secondary | ICD-10-CM | POA: Diagnosis not present

## 2018-11-16 NOTE — Patient Instructions (Addendum)
Medication Instructions:  Your physician recommends that you continue on your current medications as directed. Please refer to the Current Medication list given to you today.  If you need a refill on your cardiac medications before your next appointment, please call your pharmacy.   Lab work:  None  If you have labs (blood work) drawn today and your tests are completely normal, you will receive your results only by: [name] MyChart Message (if you have MyChart) OR . A paper copy in the mail If you have any lab test that is abnormal or we need to change your treatment, we will call you to review the results.  Testing/Procedures:  You had an EKG today  Follow-Up: At [facility], you and your health needs are our priority.  As part of our continuing mission to provide you with exceptional heart care, we have created designated Provider Care Teams.  These Care Teams include your primary Cardiologist (physician) and Advanced Practice Providers (APPs -  Physician Assistants and Nurse Practitioners) who all work together to provide you with the care you need, when you need it. You will need a follow up appointment in 6 months.    Any Other Special Instructions Will Be Listed Below (If Applicable).  Purchase [employer]. See information below  Call if your hear trate is consistently greater than 100-110         KardiaMobile [email or web address]        FDA-cleared, clinical grade mobile EKG monitor: Lourena Simmonds is the most clinically-validated mobile EKG used by the world's leading cardiac care medical professionals With Basic service, know instantly if your heart rhythm is normal or if atrial fibrillation is detected, and email the last single EKG recording to yourself or your doctor Premium service, available for purchase through the Kardia app for $9.99 per month or $99 per year, includes unlimited history and storage of your EKG recordings, a monthly EKG  summary report to share with your doctor, along with the ability to track your blood pressure, activity and weight Includes one KardiaMobile phone clip FREE SHIPPING: Standard delivery 1-3 business days. Orders placed by 11:00am PST will ship that afternoon. Otherwise, will ship next business day. All orders ship via [employer] from [place], [place]

## 2018-11-16 NOTE — Patient Outreach (Signed)
[facility]) Care Management  11/16/2018  [name] 29-Jul-1926 [id number]   Subjective: Telephone call to patient's home number, no answer, left HIPAA compliant voicemail message, and requested call back.   Objective: Per KPN (Knowledge Performance Now, point of care tool) and chart review, patient hospitalized 11/08/2018 -11/10/2018 for Acute ischemic stroke.     Patient also has a history of chronic atrial fibrillation, hyperlipidemia, First degree atrioventricular block, and First degree atrioventricular block.      Assessment: Received [facility] EMMI General Discharge follow up referral on 11/15/2018.   Red Flag Alert trigger, Day #3, patient answered yes to the following question: Feeling worse overall?   [facility] EMMI follow up pending patient contact.     Plan: RNCM will send unsuccessful outreach letter, [facility] pamphlet, handout: Know Before You Go, will call patient for 2nd telephone outreach attempt within 4 business days, [facility] EMMI follow up, and proceed with case closure, within 10 business days if no return call.       Calyse Murcia H. Gardiner Barefoot, BSN, CCM [facility] Care Management [facility] Telephonic CM Phone: [phone] Fax: [phone]\

## 2018-11-17 ENCOUNTER — Other Ambulatory Visit: Payer: Self-pay | Admitting: *Deleted

## 2018-11-17 NOTE — Patient Outreach (Addendum)
[facility]) Care Management  11/17/2018  [name] 04/25/1926 [id number]   Subjective: Per 11/18/2017 Designated Party Release on file in patient's chart for daughter [name] and sister-in-law Lamarr Lulas. Received voicemail from patient's daughter [name]) states she is returning call on Margrit Robert's behalf, and requested call back at [phone].  Telephone call to patient's daughter's home number, spoke with daughterCloyd Stagers), stated patient's name, date of birth, and address.  Discussed [facility] Care Management [employer] follow up, daughter voiced understanding, and is in agreement to follow up on patient's behalf.   Daughter states she is aware that patient has been receiving EMMI automated call, patient response to call may change daily, patient  is very emotional in general is up one day and down the next.   Discussed [facility] Care Management Social Worker services and resources may be available through primary MD.    Daughter states patient does have a history of depression, currently on medications, and is seeing a psychiatrist.  Daughter states no other mental health services needed at this time and has everything covered.  Daughter states she is working on obtaining in home caregivers for patient who lives alone and has difficulty hearing.  States she is planning to have the caregivers in place by 11/19/2018.  States patient has had follow up appointment with primary MD on 11/17/2018 and cardiologist, both appointments went well.  Daugher states patient does not have any education material, EMMI follow up, care coordination, care management, disease monitoring, transportation, [employer], or pharmacy needs at this time.  States she is very appreciative of the follow up.     Objective: Per KPN (Knowledge Performance Now, point of care tool) and chart review, patient hospitalized 11/08/2018 -11/10/2018 for Acute ischemic stroke.     Patient also  has a history of chronic atrial fibrillation, hyperlipidemia, First degree atrioventricular block, and First degree atrioventricular block.      Assessment: Received [employer] EMMI General Discharge follow up referral on 11/15/2018.  Red Flag Alert trigger, Day #3, patient answered yes to the following question: Feeling worse overall?   EMMI follow up completed and no further care management needs.       Plan: RNCM will complete case closure due to follow up completed / no care management needs.        Effa Yarrow H. Gardiner Barefoot, BSN, CCM [facility] Care Management [facility] Telephonic CM Phone: [phone] Fax: [phone]

## 2018-11-18 DIAGNOSIS — I69391 Dysphagia following cerebral infarction: Secondary | ICD-10-CM | POA: Diagnosis not present

## 2018-11-18 DIAGNOSIS — R1310 Dysphagia, unspecified: Secondary | ICD-10-CM | POA: Diagnosis not present

## 2018-11-18 DIAGNOSIS — I69322 Dysarthria following cerebral infarction: Secondary | ICD-10-CM | POA: Diagnosis not present

## 2018-11-18 DIAGNOSIS — I69351 Hemiplegia and hemiparesis following cerebral infarction affecting right dominant side: Secondary | ICD-10-CM | POA: Diagnosis not present

## 2018-11-18 DIAGNOSIS — I482 Chronic atrial fibrillation, unspecified: Secondary | ICD-10-CM | POA: Diagnosis not present

## 2018-11-22 ENCOUNTER — Other Ambulatory Visit: Payer: Self-pay | Admitting: *Deleted

## 2018-11-22 NOTE — Patient Outreach (Addendum)
[facility]) Care Management  11/22/2018  [name] 30-Sep-1925 [id number]   Subjective: Per 11/18/2017 Designated Party Release on file in patient's chart for daughter [name] and sister-in-law Lamarr Lulas.  Telephone call to patient's daughter's home number, spoke with daughterCloyd Stagers), stated patient's name, date of birth, and address.  Remember speaking with this RNCM in the past.  Discussed [facility] Care Management [employer] EMMI Stroke follow up, daughter voiced understanding, and is in agreement to follow up on patient's behalf.   Daughter states she is aware that patient has been receiving EMMI automated call, states was patient's caregiver on 3/202/2020, patient was having a good day on 3/202/2020, automated call response may have been a mistake, patient not feeling sad/ hopeless/ anxious, or empty, has caregivers in place for 20 hours per day, and patient has approximately 4 hours of me per her request, and the caregivers are working out well.  Daughter states patient does not have any education material, EMMI follow up, care coordination, care management, disease monitoring, transportation, community resource, or pharmacy needs at this time.  States she is very appreciative of the follow up.    Objective:Per KPN (Knowledge Performance Now, point of care tool) and chart review,patient hospitalized 11/08/2018 -11/10/2018 forAcute ischemic stroke. Patient also has a history of chronic atrial fibrillation, hyperlipidemia, First degree atrioventricular block, andFirst degree atrioventricular block.     Assessment: Received [employer] EMMI General Discharge follow up referral on 11/22/2018. Red Flag Alert trigger, Day #9, patient answered yes to the following question: Sad, hopeless, anxious, or empty? EMMI follow up completed and no further care management needs.       Plan:RNCM will complete case closure due to follow up  completed / no care management needs.        Junita Kubota H. Gardiner Barefoot, BSN, CCM [facility] Care Management [facility] Telephonic CM Phone: [phone] Fax: [phone]

## 2018-12-07 ENCOUNTER — Encounter: Payer: Self-pay | Admitting: *Deleted

## 2018-12-10 ENCOUNTER — Telehealth: Payer: Self-pay | Admitting: Adult Health

## 2018-12-10 NOTE — Telephone Encounter (Signed)
Pt's daughter [name] would like a call back before her mothers appt and before the decision is made if they are good with a Virtual Visit appt.to ask about the referral and if the request was made to have the pt tested for [facility] and other things. Please advise.

## 2018-12-12 NOTE — Telephone Encounter (Signed)
Patient was seen in the ED on 11/08/18. She has never been evaluated by [facility]. At this time, requesting all hospital follow up visits be transitioned to telemedicine via web ex or appt can be rescheduled out after COVID19 safety precautions lifted. Reviewed hospital notes, cognition at baseline per SLP - unsure what daughter is referring to regarding referral and evaluation of dementia and other things? Please schedule for video visit to discuss these concerns or they can follow up with PCP for dementia concerns as per SLP during inpatient, cognition at baseline and not due to stroke.

## 2018-12-13 NOTE — Telephone Encounter (Signed)
I called pts daughter [name] about her mothers appt on 12/20/2018 with Shanda Bumps NP. I stated due to COVID 19 we are changing office visit to video visits. We are doing everything virtual.We are not doing face to face visits. The daughter stated she has a tablet that has a camera on it, and a email that works. The email was verify with pts daughter [name]. Video consent and to file insurance was given. I explain pt will have to be present for the video. I gave [email or web address] has to be downloaded on her computer. The daughter verbalized understanding.  I also stated the visit will be for the stroke follow up. [name] stated Dr.PErry wanted our office to do some testing for the pt on her memory. He is not sure if pt is getting dementia. [name] stated he was going to notify our office to discuss his concerns. [name] stated the PCP offer neuro testing years ago for her moms memory. [name] stated her mother decline the testing and was not happy the PCP recommend that. She stated Dr. Marina Goodell wants our office to evaluate patient for dementia. I stated Shanda Bumps NP is aware and will be discussing the stroke follow an d answer any questions she has. [name] verbalized understanding.

## 2018-12-20 ENCOUNTER — Encounter: Payer: Self-pay | Admitting: Adult Health

## 2018-12-20 ENCOUNTER — Other Ambulatory Visit: Payer: Self-pay

## 2018-12-20 ENCOUNTER — Ambulatory Visit (INDEPENDENT_AMBULATORY_CARE_PROVIDER_SITE_OTHER): Payer: PPO | Admitting: Adult Health

## 2018-12-20 VITALS — HR 64 | Temp 98.4°F

## 2018-12-20 DIAGNOSIS — E7800 Pure hypercholesterolemia, unspecified: Secondary | ICD-10-CM | POA: Diagnosis not present

## 2018-12-20 DIAGNOSIS — Z7901 Long term (current) use of anticoagulants: Secondary | ICD-10-CM

## 2018-12-20 DIAGNOSIS — I63412 Cerebral infarction due to embolism of left middle cerebral artery: Secondary | ICD-10-CM

## 2018-12-20 DIAGNOSIS — I4821 Permanent atrial fibrillation: Secondary | ICD-10-CM | POA: Diagnosis not present

## 2018-12-20 DIAGNOSIS — R5383 Other fatigue: Secondary | ICD-10-CM

## 2018-12-20 NOTE — Progress Notes (Signed)
I agree with the above plan 

## 2018-12-20 NOTE — Progress Notes (Signed)
[facility] [address] [place]. [place] [address] [phone]       VIRTUAL VISIT FOLLOW UP NOTE  Ms. [name] Date of Birth:  1926/06/19 Medical Record Number:  [id number]   Reason for Referral:  hospital stroke follow up    Virtual Visit via Video Note  I connected with [name] on 12/20/18 at  1:15 PM EDT by a video enabled telemedicine application located remotely in my own home and verified that I am speaking with the correct person using two identifiers who was located at their own home and accompanied by her daughter, [name].   I discussed the limitations of evaluation and management by telemedicine and the availability of in person appointments. The patient expressed understanding and agreed to proceed.   CHIEF COMPLAINT:  Chief Complaint  Patient presents with   Follow-up    hospital follow up    HPI: [name] was initially scheduled today for in office hospital follow-up regarding left MCA infarct embolic secondary to AF off Xarelto for potential right eyelid surgery but due to COVID-19 safety precautions, visit transition to telemedicine via WebEx. History obtained from patient and chart review. Reviewed all radiology images and labs personally.  KatherineLatiya S Singh a 85 y.o.femalewith history of HLD and AFib off Xarelto for 9 days in preparation of left eyelid surgerywho initially presented to [facility] ED for speech difficulty and RUE weakness.TPA administered [id number] on 11/08/18 as initial CT head negative for hemorrhage or acute abnormality and NIHSS 7.  She was transferred to [facility] for further evaluation and management. MRI brain reviewed and showed small focus of acute ischemia within the left postcentral gyrus secondary to A. fib off Xarelto.  CTA head and neck showed aortic arthrosclerosis with soft plaques and nondominant left VA origin stenosis.  2D echo showed an EF of 55 to 60%.  Recommended to resume Xarelto for atrial  fibrillation and secondary stroke prevention.  BP stable.  LDL 128 and recommended pravastatin 40 mg daily.  She continues to have residual deficits of balance difficulties, mobility concerns and decrease cognition and recommended discharge to SNF for postacute rehab to maximize independence and safety but patient refused and was discharged home with recommendations a 24-hour supervision and home health therapy.  She has been stable from a stroke standpoint with residual deficits of mild right hand dexterity weakness but otherwise recovering very well.  She has since completed home therapies but continues to do home exercises as recommended along with staying active.  She does have concerns of daytime fatigue or will tire after approximately 1 hour after activity.  She is currently living in her own home with family staying with her due to safety concerns. She is able to maintain ADLs without assistance. IADLs mild assistance. Memory stable.  She continues on Xarelto without side effects of bleeding or bruising.  Continues on pravastatin without side effects myalgias.  Blood pressure not routinely monitored at home. No further concerns. Denies new or worsening stroke/TIA symptoms .    ROS:   14 system review of systems performed and negative with exception of fatigue   PMH:  Past Medical History:  Diagnosis Date   Closed fracture of one rib of right side with routine healing 06/30/2017   Current use of long term anticoagulation 07/21/2017   First degree atrioventricular block 06/26/2017   Hammer toe 06/26/2017   Hyperlipidemia 06/26/2017   Other specified cardiac arrhythmias 06/26/2017   Pain in limb 06/26/2017  Palpitations 06/26/2017   Paroxysmal atrial fibrillation (HCC) 06/26/2017   Overview:  remote history post operatively, CHADS 2 score=2 Overview:  Overview:  remote history post operatively, CHADS 2 score=2   PAT (paroxysmal atrial tachycardia) (HCC) 06/14/2015   Premature  atrial complex 06/26/2017   Premature ventricular contractions 06/26/2017   PSVT (paroxysmal supraventricular tachycardia) (HCC) 11/10/2017   Stroke (HCC)    Traumatic pneumothorax 06/30/2017    PSH:  Past Surgical History:  Procedure Laterality Date   ABDOMINAL HYSTERECTOMY     SHOULDER SURGERY     TONSILLECTOMY      Social History:  Social History   Socioeconomic History   Marital status: Single    Spouse name: Not on file   Number of children: Not on file   Years of education: Not on file   Highest education level: Not on file  Occupational History   Not on file  Social Needs   Financial resource strain: Not on file   Food insecurity:    Worry: Not on file    Inability: Not on file   Transportation needs:    Medical: Not on file    Non-medical: Not on file  Tobacco Use   Smoking status: Never Smoker   Smokeless tobacco: Never Used  Substance and Sexual Activity   Alcohol use: No    Frequency: Never   Drug use: No   Sexual activity: Not on file  Lifestyle   Physical activity:    Days per week: Not on file    Minutes per session: Not on file   Stress: Not on file  Relationships   Social connections:    Talks on phone: Not on file    Gets together: Not on file    Attends religious service: Not on file    Active member of club or organization: Not on file    Attends meetings of clubs or organizations: Not on file    Relationship status: Not on file   Intimate partner violence:    Fear of current or ex partner: Not on file    Emotionally abused: Not on file    Physically abused: Not on file    Forced sexual activity: Not on file  Other Topics Concern   Not on file  Social History Narrative   Not on file    Family History:  Family History  Problem Relation Age of Onset   Bone cancer Mother    Breast cancer Father    Diabetes Father    Heart disease Father    Breast cancer Sister     Medications:   Current  Outpatient Medications on File Prior to Visit  Medication Sig Dispense Refill   acebutolol (SECTRAL) 200 MG capsule Take 1 capsule (200 mg total) by mouth daily. 90 capsule 1   Cholecalciferol (VITAMIN D3) 1000 units CAPS Take 1,000 Units by mouth daily.     mirtazapine (REMERON) 15 MG tablet Take 15 mg by mouth daily.     Multiple Vitamins-Minerals (PRESERVISION AREDS 2 PO) Take 1 tablet by mouth daily.     Polyethylene Glycol 3350 (PEG 3350) POWD Take 17 g by mouth daily as needed (bowel movement).      Polyvinyl Alcohol-Povidone (REFRESH OP) Place 1 drop into both eyes See admin instructions. Insert 1 drop into both eyes 4-5 times daily as needed for dry eyes     pravastatin (PRAVACHOL) 40 MG tablet Take 40 mg by mouth daily.     Rivaroxaban (XARELTO) 15  MG TABS tablet Take 1 tablet (15 mg total) by mouth daily with supper. 30 tablet 3   vitamin B-12 (CYANOCOBALAMIN) 1000 MCG tablet Take 1,000 mcg by mouth daily.     No current facility-administered medications on file prior to visit.     Allergies:   Allergies  Allergen Reactions   Codeine Nausea And Vomiting   Penicillins Rash   Sulfa Antibiotics Nausea And Vomiting     Physical Exam  Vitals:   12/20/18 1325  Pulse: 64  Temp: 98.4 F (36.9 C)  SpO2: 99%   There is no height or weight on file to calculate BMI. No exam data present  Dep[facility] screen [facility] 12/20/2018  Decreased Interest 0  Down, Depressed, Hopeless 0  PHQ - 2 Score 0     General: well developed, well nourished, pleasant elderly Caucasian female, seated, in no evident distress Head: head normocephalic and atraumatic.    Neurologic Exam Mental Status: Awake and fully alert. Oriented to place and time. Recent and remote memory intact. Attention span, concentration and fund of knowledge appropriate. Mood and affect appropriate.  Cranial Nerves: Extraocular movements full without nystagmus. Hearing intact to voice. Face, tongue, palate moves  normally and symmetrically. Shoulder shrug symmetric.  Motor: No evidence of large muscle weakness per drift assessment. Sensory.: sensation intact to light touch with assistance from daughter Coordination: Rapid alternating movements slightly decreased on right hand. Finger-to-nose and heel-to-shin performed accurately bilaterally. Gait and Station: Arises from chair without difficulty. Stance is normal. Gait demonstrates normal stride length and balance without use of assistive device Reflexes: 1+ and symmetric. Toes downgoing.    NIHSS  0 Modified Rankin  1 CHA2DS2-VASc 6 HAS-BLED 2   Diagnostic Data (Labs, Imaging, Testing)  MR BRAIN WO CONTRAST 11/09/18 IMPRESSION: 1. Small focus of acute ischemia within the left postcentral gyrus. No hemorrhage or mass effect. 2. Generalized volume loss and findings of chronic ischemic microangiopathy.  ECHOCARDIOGRAM 11/09/18 IMPRESSIONS  1. The left ventricle has normal systolic function, with an ejection fraction of 55-60%. The cavity size was normal. Left ventricular diastolic function could not be evaluated secondary to atrial fibrillation.  2. Left atrial size was moderately dilated.  3. Right atrial size was mildly dilated.  4. Mild thickening of the mitral valve leaflet. There is mild mitral annular calcification present.  5. Tricuspid valve regurgitation is moderate.  6. The aortic valve is tricuspid Mild calcification of the aortic valve.  7. Normal LV function; biatrial enlargement; mild MR; moderate TR; mildly elevated pulmonary pressure.    ASSESSMENT: [name] is a 85 y.o. year old female here with left MCA infarct embolic pattern on 11/09/18 secondary to Afib off Xarelto for potential right eyelid surgery. Vascular risk factors include Afib and HLD.  She has recovered well from a stroke standpoint with residual deficit of mild decreased right hand dexterity.    PLAN:  1. Left MCA stroke : Continue Xarelto  (rivaroxaban) daily  and pravastatin  for secondary stroke prevention. Maintain strict control of hypertension with blood pressure goal below 130/90, diabetes with hemoglobin A1c goal below 6.5% and cholesterol with LDL cholesterol (bad cholesterol) goal below 70 mg/dL.  I also advised the patient to eat a healthy diet with plenty of whole grains, cereals, fruits and vegetables, exercise regularly with at least 30 minutes of continuous activity daily and maintain ideal body weight.  Advised to continue to do home exercises and stay active. 2. Fatigue:  Likely residual from stroke and  this was discussed with patient and daughter.  Advised her that this should improve over time 3. Atrial fibrillation: continue Xarelto and f/u with cardiology 4. HLD: Advised to continue current treatment regimen along with continued follow-up with PCP for future prescribing and monitoring of lipid panel     Follow up in 3 months or call earlier if needed   Greater than 50% of time during this 25 minute visit was spent on counseling, explanation of diagnosis of left MCA stroke, reviewing risk factor management of Afib and HLD, planning of further management along with potential future management, and discussion with patient and family answering all questions.    George Hugh, AGNP-BC  [facility] [address] [place], [place] [address]  Phone [phone] Fax [phone] Note: This document was prepared with digital dictation and possible smart phrase technology. Any transcriptional errors that result from this process are unintentional.

## 2018-12-21 ENCOUNTER — Ambulatory Visit: Payer: Self-pay | Admitting: *Deleted

## 2018-12-23 ENCOUNTER — Other Ambulatory Visit: Payer: Self-pay | Admitting: *Deleted

## 2018-12-23 ENCOUNTER — Other Ambulatory Visit: Payer: Self-pay

## 2018-12-23 ENCOUNTER — Encounter: Payer: Self-pay | Admitting: *Deleted

## 2018-12-23 NOTE — Patient Outreach (Addendum)
High Risk HTA Patient.  Following up as a pt advocate for HTA. I spoke with [name], pt's daughter and HCPOA today. She reports her mother is recovering quite well from her CVA. She had a follow up appt with the neurologist this week. He only noted some R hand weakness in he evaluation.  She is back to living at her own home with nearly 24 hour company. Her daughter says she is fiercely independent.  We have discussed her safety today. She does have an emergency alert and it will alarm with a fall. She is not driving but is interested in driving again. WE discussed the likelihood that she may never need to drive again and that this needs to be addressed by her doctor on her next visit.   We also discussed Advanced Directives which [name] says her mother has. She states her mother would NOT WANT CPR for to be on a ventilator. [name] gave me permission to add today's goals of care discussion to her chart, however, this link was not operative today.  She does not have any nursing, pharmacy or social work needs at this time. I have asked if I may call in 2 weeks to check up on them again and [name] has graciously agreed to this arrangement.  I will send a letter regarding our services and [name] has my name and number.  Zara Council. Burgess Estelle, MSN, [facility] Gerontological Nurse Practitioner [facility] Care Management [phone]

## 2019-01-04 ENCOUNTER — Ambulatory Visit: Payer: Medicare Other | Admitting: Cardiology

## 2019-01-06 ENCOUNTER — Other Ambulatory Visit: Payer: Self-pay

## 2019-01-06 ENCOUNTER — Other Ambulatory Visit: Payer: Self-pay | Admitting: *Deleted

## 2019-01-06 NOTE — Patient Outreach (Addendum)
HTA IDT High Risk Patient follow up.  Spoke with [name], pt's daughter today. [name] cognitive status does not allow her to give an accurate history. She had a CVA on 11/08/18 after having been off her Xarelto for 9 days for an elective surgery blephroplasty to improve her visual fields. Scans revealed: Acute Ischemic Stroke, LMCA, white matter hyperintensity probably due to chronic ishcemic microangiography and general atrophy with lobar predilection. Residual weakness of hand and increased alteration in thought processes and judgement.  [name] reports life is very difficult at this time for her mother and for her caregivers, especially for [facility], who is the "bad guy" who has to try to enforce rules in the home with her mother and all other caregivers. She says her mother has no inclination that her judgment is not normal. She feels she is fine and want to do all the independent things she used to do, which at this time are very dangerous,for example, go down the back stairs (8 steps) with a laundry basket full of clothes to put out on the clothes line. Another dangerous activity, is she wants to burn the brush pile and has against wishes and advise that this is not safe for her to do. She goes on the do this sort of thing despite advice and pleading from the family and other caregivers. So to cope they supervise her carefully to ensure something awful doesn't happen. Luckily her car in not at her home or she would be trying to drive.  [name] states Dr. Lyman Singh has done simple MMSE and has been this ladies primary care provider for some time. He has [facility] but the visit hasn't been scheduled to her knowledge. Discharge orders requested neuro eval in 4 weeks which would have been mid April.   I do see that an appt is scheuduled for 03/23/19.  I feel like the pt has vascular dementia from the history and her diagnostics.  Clearly the family is  stressed and the pt also. I have offered some suggestions to assist with pt safety and encouraged all family and caregivers go along with this plan for the time being:  1) Avoid confrontation as pt is right in her mind. 2) Use distraction to diffuse any building anxiety, for example: listen to her favorite gospel music, watch a favorite game show or story. Use reminiscense to talk about her favorite memories. Do inside safe house hold chores and outdoors safe activities. She must always be supervised. 3) Put a STOP sign on back door with a statement that the back stair are broken and not safe, use front door. Everyone must stop using the back door so she can see that the sign must be true. 4)Remove the brush pile so there is no issue with it. 5)We discussed if her mother continues to do dangerous activities that the family cannot prevent it may be necessary to consider a dementia unit that is locked down. 6)I will see if I can get special permission to do a home visit and do some congnative testing to pass on the [facility] to expedite this necessary evaluation. [name] has given her permission. I will also notify Dr. Lyman Singh if permission is granted.  Safety is the main issue which the health care team needs to act on ASAP.  Katherine Bankhonda Rumple, MSN, [facility] has given permission for me to see this patient at home. I will see her Monday, May 11th at 1:00  pm.  I am sending Dr. Marina Singh my note and also sending him an inbox to get his blessing for this plan and ask for any specific requests he may have.  Katherine Council. Burgess Estelle, MSN, [facility] Gerontological Nurse Practitioner [facility] Care Management [phone]

## 2019-01-07 ENCOUNTER — Other Ambulatory Visit: Payer: Self-pay | Admitting: *Deleted

## 2019-01-07 ENCOUNTER — Other Ambulatory Visit: Payer: Self-pay

## 2019-01-07 NOTE — Patient Outreach (Signed)
Updated, pt daughter, [name], that communication has been sent to Dr. Marina Goodell and Ms. Vanschaick, NP with [facility] that a home visit will be made on Monday and to advise if any specific assessments are to be completed.  Have not received communication back from either at this time.  Will call Dr. Marina Goodell on Monday am if I have not heard back by inbasket.  Zara Council. Burgess Estelle, MSN, [facility] Gerontological Nurse Practitioner [facility] Care Management [phone]

## 2019-01-10 ENCOUNTER — Other Ambulatory Visit: Payer: Self-pay

## 2019-01-10 ENCOUNTER — Encounter: Payer: Self-pay | Admitting: *Deleted

## 2019-01-10 ENCOUNTER — Other Ambulatory Visit: Payer: Self-pay | Admitting: *Deleted

## 2019-01-10 NOTE — Patient Outreach (Addendum)
[facility]) Care Management   01/10/2019  Katherine HurdleDoris S Singh 11/04/1925 [id number]  Katherine HurdleDoris S Singh is an 85 y.o. female  Subjective:  HTA requested care management assessment for home safety after CVA.  Pt daughter is very concerned about her mother's safety an making good judgment decisions. [name] is certain she is back to pre-stroke status and feels she can do everything she used to do including driving which she is most anxious to get Dr. Lamar SprinklesPerry's approval to do so.  Objective:   Review of Systems  Constitutional: Positive for weight loss.  HENT: Negative.   Eyes:       Dry eyes.  Respiratory: Negative.   Cardiovascular: Negative.   Gastrointestinal: Positive for constipation.  Genitourinary: Negative.   Musculoskeletal: Positive for back pain.  Skin: Negative.   Neurological: Negative.   Endo/Heme/Allergies: Bruises/bleeds easily.  Psychiatric/Behavioral: Positive for depression and memory loss.       SAD controlled with Remeron and assists with sleep, Pt gets 8-10 hours of rest a night.   BP (!) 100/50   Pulse 100 Comment: Irregular in AFIB rate 85-110  Resp 16   Wt 108 lb (49 kg)   SpO2 98%   BMI 19.13 kg/m   Physical Exam  Constitutional: She is oriented to person, place, and time. She appears well-developed.  Eyes: Pupils are equal, round, and reactive to light.  Neck: Normal range of motion.  Cardiovascular:  AFIB rate 85-110  Respiratory: Breath sounds normal.  GI: Soft. Bowel sounds are normal.  Musculoskeletal: Normal range of motion.  Neurological: She is oriented to person, place, and time.  Skin: Skin is warm and dry.  Psychiatric: She has a normal mood and affect. Her behavior is normal. Thought content normal.    Encounter Medications:   Outpatient Encounter Medications as of 01/10/2019  Medication Sig  . acebutolol (SECTRAL) 200 MG capsule Take 1 capsule (200 mg total) by mouth daily.  . Cholecalciferol (VITAMIN D3) 1000  units CAPS Take 1,000 Units by mouth daily.  . mirtazapine (REMERON) 15 MG tablet Take 15 mg by mouth daily.  . Multiple Vitamins-Minerals (PRESERVISION AREDS 2 PO) Take 1 tablet by mouth daily.  . Polyethylene Glycol 3350 (PEG 3350) POWD Take 17 g by mouth daily as needed (bowel movement).   . Polyvinyl Alcohol-Povidone (REFRESH OP) Place 1 drop into both eyes See admin instructions. Insert 1 drop into both eyes 4-5 times daily as needed for dry eyes  . pravastatin (PRAVACHOL) 40 MG tablet Take 40 mg by mouth daily.  . Rivaroxaban (XARELTO) 15 MG TABS tablet Take 1 tablet (15 mg total) by mouth daily with supper.  . vitamin B-12 (CYANOCOBALAMIN) 1000 MCG tablet Take 1,000 mcg by mouth daily.   No facility-administered encounter medications on file as of 01/10/2019.     Functional Status:   In your present state of health, do you have any difficulty performing the following activities: 01/10/2019 11/09/2018  Hearing? Malvin JohnsY Y  Vision? Y Y  Difficulty concentrating or making decisions? Malvin JohnsY Y  Walking or climbing stairs? N N  Dressing or bathing? N N  Doing errands, shopping? (No Data) N  Comment Unsure. someone does her errands now and she has not had a OV since her CVA due to COVID19 -  Quarry managerreparing Food and eating ? N -  Using the Toilet? N -  In the past six months, have you accidently leaked urine? N -  Do you have problems with loss of  bowel control? N -  Managing your Medications? N -  Managing your Finances? N -  Housekeeping or managing your Housekeeping? N -  Some recent data might be hidden    Fall/Depression Screening:    Fall Risk  01/10/2019  Falls in the past year? 0  Number falls in past yr: 0  Risk for fall due to : History of fall(s);Impaired vision;Medication side effect;Mental status change  Follow up Falls evaluation completed;Education provided;Falls prevention discussed   PHQ 2/9 Scores 01/10/2019 12/20/2018  PHQ - 2 Score 0 0   MoCA Score: 25/30 MIS: 12/15 Deficits  are in the memory and recall areas.  Assessment:  S/P CVA   AFIB   Seasonal Affective Disorder   Mild Congnitive impairment   High risk for falls   Constipation   Slight visual and hearing deficits    Plan: Discussed household safety and fall prevention. Hold off on driving until Dr. Marina Goodell advises that you may. Drink more water and especially have 8 oz of fluid with the miralax. Eat more fruit and vegetables. Walk as much as possible. Encouraged brain stimulating activities: word search, watch game shows, have caregiver do some mental exercises with you. See Dr. Marina Goodell when office opens for visits. Follow up with neurology in July. Have an eye exam and audiological exam.  Will report today's evaluation with Dr. Marina Goodell, George Hugh, and pt's daughter, [name].  My general feeling is that, [name] performed much better than I had anticipated on the MoCA (25/30) and 12/15 on the MIS. This would classifiy her to have MCI. I really think with some trial drives she could be OK going to the grocery or church but I would suggest avoiding going to [place] or [employer] at this time. I recognize that her daughter is most concerned about her mother's safety and others on the road. We only have one mother. Dr. Marina Goodell I think would be the best person to advise pt and daughter on this activity.  No further needs for care management at this time. Encourage pt and caregiver they can call me any time.  Zara Council. Burgess Estelle, MSN, [facility] Gerontological Nurse Practitioner [facility] Care Management [phone]

## 2019-01-26 ENCOUNTER — Other Ambulatory Visit: Payer: Self-pay | Admitting: *Deleted

## 2019-01-26 NOTE — Patient Outreach (Signed)
[facility]) Care Management  01/26/2019  [name] [birth year] [id number]   Health risk screening call has been completed. Patient will receive appropriate information to the [facility] care management program.   Patient will be followed in the HTA ,Health risk assessment engaged program.    Plan Will send [facility] care management welcome letter  Will plan follow up call in within 6 months.    Egbert Garibaldi, RN, [facility] Care Management,Care Management Coordinator  [phone]- Mobile [phone]- Toll Free Main Office

## 2019-01-27 DIAGNOSIS — I69390 Apraxia following cerebral infarction: Secondary | ICD-10-CM | POA: Diagnosis not present

## 2019-01-31 ENCOUNTER — Institutional Professional Consult (permissible substitution): Payer: PPO | Admitting: Adult Health

## 2019-02-04 ENCOUNTER — Other Ambulatory Visit: Payer: Self-pay

## 2019-02-04 NOTE — Patient Outreach (Signed)
Telephone outreach to patient to obtain mRs was successfully completed. mRs= 0. 

## 2019-02-10 DIAGNOSIS — M2042 Other hammer toe(s) (acquired), left foot: Secondary | ICD-10-CM | POA: Diagnosis not present

## 2019-02-15 DIAGNOSIS — M10072 Idiopathic gout, left ankle and foot: Secondary | ICD-10-CM | POA: Diagnosis not present

## 2019-02-15 DIAGNOSIS — M2042 Other hammer toe(s) (acquired), left foot: Secondary | ICD-10-CM | POA: Diagnosis not present

## 2019-02-18 ENCOUNTER — Other Ambulatory Visit: Payer: Self-pay | Admitting: *Deleted

## 2019-02-18 NOTE — Patient Outreach (Signed)
[facility]) Care Management  02/18/2019  [name] [birth year] [id number]   Patient  case has been transitioned to [facility] to continue Care Management services.    Joylene Draft, RN, [place] Management Coordinator  [phone]- Mobile [phone]- Toll Free Main Office

## 2019-02-22 DIAGNOSIS — L03032 Cellulitis of left toe: Secondary | ICD-10-CM | POA: Diagnosis not present

## 2019-02-22 DIAGNOSIS — M19079 Primary osteoarthritis, unspecified ankle and foot: Secondary | ICD-10-CM | POA: Diagnosis not present

## 2019-02-22 DIAGNOSIS — M2042 Other hammer toe(s) (acquired), left foot: Secondary | ICD-10-CM | POA: Diagnosis not present

## 2019-02-23 ENCOUNTER — Ambulatory Visit: Payer: PPO | Admitting: Sports Medicine

## 2019-03-01 DIAGNOSIS — Z0000 Encounter for general adult medical examination without abnormal findings: Secondary | ICD-10-CM | POA: Diagnosis not present

## 2019-03-01 DIAGNOSIS — Z1159 Encounter for screening for other viral diseases: Secondary | ICD-10-CM | POA: Diagnosis not present

## 2019-03-01 DIAGNOSIS — Z6820 Body mass index (BMI) 20.0-20.9, adult: Secondary | ICD-10-CM | POA: Diagnosis not present

## 2019-03-23 ENCOUNTER — Ambulatory Visit: Payer: PPO | Admitting: Adult Health

## 2019-04-11 ENCOUNTER — Other Ambulatory Visit: Payer: Self-pay | Admitting: Cardiology

## 2019-04-17 NOTE — Progress Notes (Signed)
Cardiology Office Note:    Date:  04/18/2019   ID:  [name], DOB 30-Apr-1926, MRN [id number]  PCP:  Katherine Anes, MD  Cardiologist:  Katherine More, MD    Referring MD: [name],*    ASSESSMENT:    1. Persistent atrial fibrillation   2. Current use of long term anticoagulation   3. Pure hypercholesterolemia   4. Thoracic aorta atherosclerosis ([place])    PLAN:    In order of problems listed above:  1. Stable rate control continue beta-blocker and current anticoagulant 2. Continue anticoagulant advised patient daughter needed options should include a discussion with her medical physician 3. Continue statin she has arrangements next months for lipid and CMP in her PCP office 4. Stable continue treatment including anticoagulant and lipid-lowering therapy   Next appointment: 6 months   Medication Adjustments/Labs and Tests Ordered: Current medicines are reviewed at length with the patient today.  Concerns regarding medicines are outlined above.  No orders of the defined types were placed in this encounter.  No orders of the defined types were placed in this encounter.   Chief Complaint  Patient presents with  . Follow-up  . Atrial Fibrillation  . Anticoagulation    History of Present Illness:    [name] is a 85 y.o. female with a hx of atrial fibrillation on anticoagulation, PAT, DLD ,COPD, CVA (11/08/18) L middle cerebral artery after Xarelto withdrawn for eye surgery last seen 11/16/18. Echo 11/09/18 normal LV size and function, diastolic function indeterminite due to atrial fibrillation, mod LA and RA enlargement, mod TR. CTA chest (done at [name]) shows extensive mural thrombus and atherosclerosis in the distal arch and descending thoracic aorta.   Compliance with diet, lifestyle and medications: Yes  She made a complete recovery from stroke no complaints of weakness unsteady gait falls bleeding edema shortness of breath or chest  pain.  Compliant with her anticoagulant.  Lab work next month at her PCP office with renal function potassium liver function and lipids I will also check a CBC with anticoagulation.  Her rhythm now is persistent atrial fibrillation rate controlled continue beta-blocker Past Medical History:  Diagnosis Date  . Closed fracture of one rib of right side with routine healing 06/30/2017  . Current use of long term anticoagulation 07/21/2017  . Depression   . First degree atrioventricular block 06/26/2017  . Hammer toe 06/26/2017  . Hyperlipidemia 06/26/2017  . Other specified cardiac arrhythmias 06/26/2017  . Pain in limb 06/26/2017  . Palpitations 06/26/2017  . Paroxysmal atrial fibrillation ([place]) 06/26/2017   Overview:  remote history post operatively, CHADS 2 score=2 Overview:  Overview:  remote history post operatively, CHADS 2 score=2  . PAT (paroxysmal atrial tachycardia) ([place]) 06/14/2015  . Premature atrial complex 06/26/2017  . Premature ventricular contractions 06/26/2017  . PSVT (paroxysmal supraventricular tachycardia) ([place]) 11/10/2017  . Stroke ([place])   . Traumatic pneumothorax 06/30/2017    Past Surgical History:  Procedure Laterality Date  . ABDOMINAL HYSTERECTOMY    . SHOULDER SURGERY    . TONSILLECTOMY      Current Medications: Current Meds  Medication Sig  . acebutolol (SECTRAL) 200 MG capsule TAKE 1 CAPSULE BY MOUTH DAILY.  [name] Cholecalciferol (VITAMIN D3) 1000 units CAPS Take 1,000 Units by mouth daily.  . mirtazapine (REMERON) 15 MG tablet Take 15 mg by mouth daily.  . Multiple Vitamins-Minerals (PRESERVISION AREDS 2 PO) Take 1 tablet by mouth 2 (two) times daily.   . Polyethylene Glycol  3350 (PEG 3350) POWD Take 17 g by mouth daily as needed (bowel movement).   . Polyvinyl Alcohol-Povidone (REFRESH OP) Place 1 drop into both eyes See admin instructions. Insert 1 drop into both eyes 4-5 times daily as needed for dry eyes  . pravastatin (PRAVACHOL) 40 MG tablet Take 40  mg by mouth daily.  . Rivaroxaban (XARELTO) 15 MG TABS tablet Take 1 tablet (15 mg total) by mouth daily with supper.  . vitamin B-12 (CYANOCOBALAMIN) 1000 MCG tablet Take 1,000 mcg by mouth daily.     Allergies:   Codeine, Penicillins, and Sulfa antibiotics   Social History   Socioeconomic History  . Marital status: Widowed    Spouse name: Not on file  . Number of children: 2  . Years of education: Not on file  . Highest education level: Not on file  Occupational History  . Not on file  Social Needs  . Financial resource strain: Not hard at all  . Food insecurity    Worry: Never true    Inability: Never true  . Transportation needs    Medical: No    Non-medical: No  Tobacco Use  . Smoking status: Never Smoker  . Smokeless tobacco: Never Used  Substance and Sexual Activity  . Alcohol use: No    Frequency: Never  . Drug use: No  . Sexual activity: Not Currently  Lifestyle  . Physical activity    Days per week: 0 days    Minutes per session: 0 min  . Stress: Not at all  Relationships  . Social connections    Talks on phone: Singh than three times a week    Gets together: Singh than three times a week    Attends religious service: Singh than 4 times per year    Active member of club or organization: No    Attends meetings of clubs or organizations: Never    Relationship status: Widowed  Other Topics Concern  . Not on file  Social History Narrative   [name] is widowed. Her daughter, [name], is her POA and lives 12 miles away. She resides in her own home, alone for last 7 years. Since her CVA she has had someone with her most of the time. She has a hired caregiver or a family member stays most of the day. She feel safe in her home and in her neighborhood.     Family History: The patient's family history includes Bone cancer in her mother; Breast cancer in her father and sister; Diabetes in her father; Heart disease in her father. ROS:   Please see the  history of present illness.    All other systems reviewed and are negative.  EKGs/Labs/Other Studies Reviewed:    The following studies were reviewed today:    MR BRAIN WO CONTRAST 11/09/18 IMPRESSION: 1. Small focus of acute ischemia within the left postcentral gyrus. No hemorrhage or mass effect. 2. Generalized volume loss and findings of chronic ischemic microangiopathy.   ECHOCARDIOGRAM 11/09/18 IMPRESSIONS  1. The left ventricle has normal systolic function, with an ejection fraction of 55-60%. The cavity size was normal. Left ventricular diastolic function could not be evaluated secondary to atrial fibrillation.  2. Left atrial size was moderately dilated.  3. Right atrial size was mildly dilated.  4. Mild thickening of the mitral valve leaflet. There is mild mitral annular calcification present.  5. Tricuspid valve regurgitation is moderate.  6. The aortic valve is tricuspid Mild calcification of the aortic  valve.  7. Normal LV function; biatrial enlargement; mild MR; moderate TR; mildly elevated pulmonary pressure.    Recent Labs: 11/09/2018: BUN 12; Creatinine, Ser 0.75; Hemoglobin 10.5; Platelets 183; Potassium 3.9; Sodium 138  Recent Lipid Panel    Component Value Date/Time   CHOL 181 11/09/2018 0512   TRIG 57 11/09/2018 0512   HDL 42 11/09/2018 0512   CHOLHDL 4.3 11/09/2018 0512   VLDL 11 11/09/2018 0512   LDLCALC 128 (H) 11/09/2018 0512    Physical Exam:    VS:  BP 110/76 (BP Location: Right Arm, Patient Position: Sitting, Cuff Size: Normal)   Pulse 68   Ht 5\' 3"  (1.6 m)   Wt 107 lb 6.4 oz (48.7 kg)   SpO2 98%   BMI 19.03 kg/m     Wt Readings from Last 3 Encounters:  04/18/19 107 lb 6.4 oz (48.7 kg)  01/10/19 108 lb (49 kg)  11/16/18 107 lb 6.4 oz (48.7 kg)     GEN:  Well nourished, well developed in no acute distress HEENT: Normal NECK: No JVD; No carotid bruits LYMPHATICS: No lymphadenopathy CARDIAC: Irr Irr variable S1 , no murmurs, rubs,  gallops RESPIRATORY:  Clear to auscultation without rales, wheezing or rhonchi  ABDOMEN: Soft, non-tender, non-distended MUSCULOSKELETAL:  No edema; No deformity  SKIN: Warm and dry NEUROLOGIC:  Alert and oriented x 3 PSYCHIATRIC:  Normal affect    Signed, Norman HerrlichBrian Munley, MD  04/18/2019 8:30 AM    [facility]

## 2019-04-18 ENCOUNTER — Encounter: Payer: Self-pay | Admitting: Cardiology

## 2019-04-18 ENCOUNTER — Ambulatory Visit (INDEPENDENT_AMBULATORY_CARE_PROVIDER_SITE_OTHER): Payer: PPO | Admitting: Cardiology

## 2019-04-18 ENCOUNTER — Other Ambulatory Visit: Payer: Self-pay

## 2019-04-18 VITALS — BP 110/76 | HR 68 | Ht 63.0 in | Wt 107.4 lb

## 2019-04-18 DIAGNOSIS — I700 Atherosclerosis of aorta: Secondary | ICD-10-CM

## 2019-04-18 DIAGNOSIS — E7800 Pure hypercholesterolemia, unspecified: Secondary | ICD-10-CM | POA: Diagnosis not present

## 2019-04-18 DIAGNOSIS — Z7901 Long term (current) use of anticoagulants: Secondary | ICD-10-CM | POA: Diagnosis not present

## 2019-04-18 DIAGNOSIS — I4819 Other persistent atrial fibrillation: Secondary | ICD-10-CM

## 2019-04-18 NOTE — Patient Instructions (Signed)

## 2019-04-22 ENCOUNTER — Telehealth: Payer: Self-pay | Admitting: Cardiology

## 2019-04-22 NOTE — Telephone Encounter (Signed)
°*  STAT* If patient is at the pharmacy, call can be transferred to refill team.   1. Which medications need to be refilled? (please list name of each medication and dose if known) Acebutolol 200mg   2. Which pharmacy/location (including street and city if local pharmacy) is medication to be sent to?[facility] in [place]  3. Do they need a 30 day or 90 day supply? [place]

## 2019-04-25 MED ORDER — ACEBUTOLOL HCL 200 MG PO CAPS: 200.0000 mg | ORAL_CAPSULE | Freq: Every day | ORAL | 1 refills | Status: DC

## 2019-04-25 NOTE — Telephone Encounter (Signed)
Refill for acebutolol has been sent to [facility] in [place] as requested.

## 2019-05-04 ENCOUNTER — Ambulatory Visit: Payer: PPO | Admitting: Cardiology

## 2019-05-11 ENCOUNTER — Ambulatory Visit: Payer: PPO | Admitting: Cardiology

## 2019-05-19 ENCOUNTER — Ambulatory Visit: Payer: PPO | Admitting: *Deleted

## 2019-06-01 DIAGNOSIS — I69313 Psychomotor deficit following cerebral infarction: Secondary | ICD-10-CM | POA: Diagnosis not present

## 2019-06-01 DIAGNOSIS — I69390 Apraxia following cerebral infarction: Secondary | ICD-10-CM | POA: Diagnosis not present

## 2019-06-01 DIAGNOSIS — H906 Mixed conductive and sensorineural hearing loss, bilateral: Secondary | ICD-10-CM | POA: Diagnosis not present

## 2019-06-01 DIAGNOSIS — I482 Chronic atrial fibrillation, unspecified: Secondary | ICD-10-CM | POA: Diagnosis not present

## 2019-06-01 DIAGNOSIS — Z1159 Encounter for screening for other viral diseases: Secondary | ICD-10-CM | POA: Diagnosis not present

## 2019-06-01 DIAGNOSIS — Z681 Body mass index (BMI) 19 or less, adult: Secondary | ICD-10-CM | POA: Diagnosis not present

## 2019-06-01 DIAGNOSIS — Z23 Encounter for immunization: Secondary | ICD-10-CM | POA: Diagnosis not present

## 2019-06-01 DIAGNOSIS — E440 Moderate protein-calorie malnutrition: Secondary | ICD-10-CM | POA: Diagnosis not present

## 2019-06-01 DIAGNOSIS — I10 Essential (primary) hypertension: Secondary | ICD-10-CM | POA: Diagnosis not present

## 2019-09-13 ENCOUNTER — Ambulatory Visit: Payer: Medicare Other | Attending: Internal Medicine

## 2019-09-13 DIAGNOSIS — Z23 Encounter for immunization: Secondary | ICD-10-CM

## 2019-09-13 NOTE — Progress Notes (Signed)
   Covid-19 Vaccination Clinic  Name:  RYLAND SMOOTS    MRN: [id number] DOB: 1925-10-07  09/13/2019  Ms. Overstreet was observed post Covid-19 immunization for 30 minutes based on pre-vaccination screening without incidence. She was provided with Vaccine Information Sheet and instruction to access the V-Safe system.   Ms. Krygier was instructed to call 911 with any severe reactions post vaccine: [name] Difficulty breathing  . Swelling of your face and throat  . A fast heartbeat  . A bad rash all over your body  . Dizziness and weakness    Immunizations Administered    Name Date Dose VIS Date Route   Pfizer COVID-19 Vaccine 09/13/2019 10:05 AM 0.3 mL 08/12/2019 Intramuscular   Manufacturer: [employer], [employer]   Lot: [id number]   NDC: [address]-9

## 2019-10-03 ENCOUNTER — Ambulatory Visit: Payer: PPO | Attending: Internal Medicine

## 2019-10-03 DIAGNOSIS — Z23 Encounter for immunization: Secondary | ICD-10-CM | POA: Insufficient documentation

## 2019-10-03 NOTE — Progress Notes (Signed)
   Covid-19 Vaccination Clinic  Name:  DANIELLA DEWBERRY    MRN: [id number] DOB: 03-Feb-1926  10/03/2019  Ms. Mcmaster was observed post Covid-19 immunization for 15 minutes without incidence. She was provided with Vaccine Information Sheet and instruction to access the V-Safe system.   Ms. Lewan was instructed to call 911 with any severe reactions post vaccine: [name] Difficulty breathing  . Swelling of your face and throat  . A fast heartbeat  . A bad rash all over your body  . Dizziness and weakness    Immunizations Administered    Name Date Dose VIS Date Route   Pfizer COVID-19 Vaccine 10/03/2019  9:21 AM 0.3 mL 08/12/2019 Intramuscular   Manufacturer: [employer], [employer]   Lot: [id number]   NDC: [address]-2

## 2019-10-10 ENCOUNTER — Other Ambulatory Visit: Payer: Self-pay

## 2019-10-10 ENCOUNTER — Ambulatory Visit: Payer: PPO

## 2019-10-10 DIAGNOSIS — I10 Essential (primary) hypertension: Secondary | ICD-10-CM

## 2019-10-10 DIAGNOSIS — E782 Mixed hyperlipidemia: Secondary | ICD-10-CM

## 2019-10-11 LAB — COMPREHENSIVE METABOLIC PANEL
ALT: 15 IU/L (ref 0–32)
AST: 19 IU/L (ref 0–40)
Albumin/Globulin Ratio: 1.6 (ref 1.2–2.2)
Albumin: 4.2 g/dL (ref 3.5–4.6)
Alkaline Phosphatase: 73 IU/L (ref 39–117)
BUN/Creatinine Ratio: 22 (ref 12–28)
BUN: 17 mg/dL (ref 10–36)
Bilirubin Total: 0.5 mg/dL (ref 0.0–1.2)
CO2: 23 mmol/L (ref 20–29)
Calcium: 9.6 mg/dL (ref 8.7–10.3)
Chloride: 101 mmol/L (ref 96–106)
Creatinine, Ser: 0.79 mg/dL (ref 0.57–1.00)
GFR calc Af Amer: 76 mL/min/{1.73_m2} (ref >59)
GFR calc non Af Amer: 66 mL/min/{1.73_m2} (ref >59)
Globulin, Total: 2.6 g/dL (ref 1.5–4.5)
Glucose: 90 mg/dL (ref 65–99)
Potassium: 4.5 mmol/L (ref 3.5–5.2)
Sodium: 140 mmol/L (ref 134–144)
Total Protein: 6.8 g/dL (ref 6.0–8.5)

## 2019-10-11 LAB — CBC WITH DIFFERENTIAL/PLATELET
Basophils Absolute: 0.1 10*3/uL (ref 0.0–0.2)
Basos: 1 %
EOS (ABSOLUTE): 0.2 10*3/uL (ref 0.0–0.4)
Eos: 4 %
Hematocrit: 37.4 % (ref 34.0–46.6)
Hemoglobin: 13.1 g/dL (ref 11.1–15.9)
Immature Grans (Abs): 0 10*3/uL (ref 0.0–0.1)
Immature Granulocytes: 1 %
Lymphocytes Absolute: 1.7 10*3/uL (ref 0.7–3.1)
Lymphs: 29 %
MCH: 34.5 pg — ABNORMAL HIGH (ref 26.6–33.0)
MCHC: 35 g/dL (ref 31.5–35.7)
MCV: 98 fL — ABNORMAL HIGH (ref 79–97)
Monocytes Absolute: 0.7 10*3/uL (ref 0.1–0.9)
Monocytes: 11 %
Neutrophils Absolute: 3.4 10*3/uL (ref 1.4–7.0)
Neutrophils: 54 %
Platelets: 237 10*3/uL (ref 150–450)
RBC: 3.8 x10E6/uL (ref 3.77–5.28)
RDW: 13.1 % (ref 11.7–15.4)
WBC: 6.1 10*3/uL (ref 3.4–10.8)

## 2019-10-11 LAB — LIPID PANEL
Chol/HDL Ratio: 3.7 ratio (ref 0.0–4.4)
Cholesterol, Total: 213 mg/dL — ABNORMAL HIGH (ref 100–199)
HDL: 58 mg/dL (ref >39)
LDL Chol Calc (NIH): 136 mg/dL — ABNORMAL HIGH (ref 0–99)
Triglycerides: 109 mg/dL (ref 0–149)
VLDL Cholesterol Cal: 19 mg/dL (ref 5–40)

## 2019-10-11 LAB — CARDIOVASCULAR RISK ASSESSMENT

## 2019-10-11 NOTE — Progress Notes (Signed)
Cholesterol high but > 84 yo no treatment, kidney and liver tests good, no anemia lp

## 2019-10-16 DIAGNOSIS — I10 Essential (primary) hypertension: Secondary | ICD-10-CM

## 2019-10-16 DIAGNOSIS — F4321 Adjustment disorder with depressed mood: Secondary | ICD-10-CM | POA: Insufficient documentation

## 2019-10-16 DIAGNOSIS — I69313 Psychomotor deficit following cerebral infarction: Secondary | ICD-10-CM | POA: Insufficient documentation

## 2019-10-16 DIAGNOSIS — E538 Deficiency of other specified B group vitamins: Secondary | ICD-10-CM | POA: Insufficient documentation

## 2019-10-16 DIAGNOSIS — I69390 Apraxia following cerebral infarction: Secondary | ICD-10-CM

## 2019-10-16 HISTORY — DX: Adjustment disorder with depressed mood: F43.21

## 2019-10-16 HISTORY — DX: Apraxia following cerebral infarction: I69.390

## 2019-10-16 HISTORY — DX: Psychomotor deficit following cerebral infarction: I69.313

## 2019-10-16 HISTORY — DX: Essential (primary) hypertension: I10

## 2019-10-16 HISTORY — DX: Deficiency of other specified B group vitamins: E53.8

## 2019-10-17 ENCOUNTER — Other Ambulatory Visit: Payer: Self-pay

## 2019-10-17 ENCOUNTER — Encounter: Payer: Self-pay | Admitting: Legal Medicine

## 2019-10-17 ENCOUNTER — Ambulatory Visit (INDEPENDENT_AMBULATORY_CARE_PROVIDER_SITE_OTHER): Payer: PPO | Admitting: Legal Medicine

## 2019-10-17 DIAGNOSIS — E538 Deficiency of other specified B group vitamins: Secondary | ICD-10-CM

## 2019-10-17 DIAGNOSIS — I10 Essential (primary) hypertension: Secondary | ICD-10-CM

## 2019-10-17 DIAGNOSIS — I69390 Apraxia following cerebral infarction: Secondary | ICD-10-CM | POA: Diagnosis not present

## 2019-10-17 DIAGNOSIS — I69313 Psychomotor deficit following cerebral infarction: Secondary | ICD-10-CM | POA: Diagnosis not present

## 2019-10-17 DIAGNOSIS — F4321 Adjustment disorder with depressed mood: Secondary | ICD-10-CM | POA: Diagnosis not present

## 2019-10-17 NOTE — Assessment & Plan Note (Signed)
AN INDIVIDUAL CARE PLAN was established and reinforced today.  The patient's status was assessed using clinical findings on exam, labs, and other diagnostic testing. Patient's success at meeting treatment goals based on disease specific evidence-bassed guidelines and found to be in fair control. RECOMMENDATIONS include maintaining present medicines and treatment. 

## 2019-10-17 NOTE — Progress Notes (Signed)
Established Patient Office Visit  Subjective:  Patient ID: [name], female    DOB: 07/28/1926  Age: 86 y.o. MRN: [id number]  CC:  Chief Complaint  Patient presents with  . Depression  . Hyperlipidemia  . Atrial Fibrillation    HPI [name] presents for Chronic visit.  Patient presents for follow up of hypertension.  Patient tolerating acebutolol well with side effects.  Patient was diagnosed with hypertension 2010 so has been treated for hypertension for 10 years.Patient is working on maintaining diet and exercise regimen and follows up as directed. Complication include nonr.  Depression is improving, she is on therapy lamp and Remeron.  She is doing 15 mg.  Sleeping well.  No disorientation and no motor disorder.  Atrial fibrillating controlled rate and is on xarelto for stroke prophylaxis.  Past Medical History:  Diagnosis Date  . Closed fracture of one rib of right side with routine healing 06/30/2017  . Current use of long term anticoagulation 07/21/2017  . Depression   . First degree atrioventricular block 06/26/2017  . Hammer toe 06/26/2017  . Hyperlipidemia 06/26/2017  . Other specified cardiac arrhythmias 06/26/2017  . Pain in limb 06/26/2017  . Palpitations 06/26/2017  . Paroxysmal atrial fibrillation (HCC) 06/26/2017   Overview:  remote history post operatively, CHADS 2 score=2 Overview:  Overview:  remote history post operatively, CHADS 2 score=2  . PAT (paroxysmal atrial tachycardia) (HCC) 06/14/2015  . Premature atrial complex 06/26/2017  . Premature ventricular contractions 06/26/2017  . PSVT (paroxysmal supraventricular tachycardia) (HCC) 11/10/2017  . Stroke (HCC)   . Traumatic pneumothorax 06/30/2017    Past Surgical History:  Procedure Laterality Date  . ABDOMINAL HYSTERECTOMY    . SHOULDER SURGERY    . TONSILLECTOMY      Family History  Problem Relation Age of Onset  . Bone cancer Mother   . Breast cancer Father   . Diabetes  Father   . Heart disease Father   . Breast cancer Sister     Social History   Socioeconomic History  . Marital status: Widowed    Spouse name: Not on file  . Number of children: 2  . Years of education: Not on file  . Highest education level: Not on file  Occupational History  . Not on file  Tobacco Use  . Smoking status: Never Smoker  . Smokeless tobacco: Never Used  Substance and Sexual Activity  . Alcohol use: No  . Drug use: No  . Sexual activity: Not Currently  Other Topics Concern  . Not on file  Social History Narrative   [name] is widowed. Her daughter, [name], is her POA and lives 12 miles away. She resides in her own home, alone for last 7 years. Since her CVA she has had someone with her most of the time. She has a hired caregiver or a family member stays most of the day. She feel safe in her home and in her neighborhood.   Social Determinants of Health   Financial Resource Strain: Low Risk   . Difficulty of Paying Living Expenses: Not hard at all  Food Insecurity: No Food Insecurity  . Worried About Programme researcher, broadcasting/film/video in the Last Year: Never true  . Ran Out of Food in the Last Year: Never true  Transportation Needs: No Transportation Needs  . Lack of Transportation (Medical): No  . Lack of Transportation (Non-Medical): No  Physical Activity: Inactive  . Days of Exercise per Week: 0  days  . Minutes of Exercise per Session: 0 min  Stress: No Stress Concern Present  . Feeling of Stress : Not at all  Social Connections: Somewhat Isolated  . Frequency of Communication with Friends and Family: More than three times a week  . Frequency of Social Gatherings with Friends and Family: More than three times a week  . Attends Religious Services: More than 4 times per year  . Active Member of Clubs or Organizations: No  . Attends Banker Meetings: Never  . Marital Status: Widowed  Intimate Partner Violence:   . Fear of Current or Ex-Partner:  Not on file  . Emotionally Abused: Not on file  . Physically Abused: Not on file  . Sexually Abused: Not on file    Outpatient Medications Prior to Visit  Medication Sig Dispense Refill  . acebutolol (SECTRAL) 200 MG capsule Take 1 capsule (200 mg total) by mouth daily. 90 capsule 1  . Cholecalciferol (VITAMIN D3) 1000 units CAPS Take 1,000 Units by mouth daily.    . mirtazapine (REMERON) 15 MG tablet Take 15 mg by mouth daily.    . Polyethylene Glycol 3350 (PEG 3350) POWD Take 17 g by mouth daily as needed (bowel movement).     . pravastatin (PRAVACHOL) 40 MG tablet Take 40 mg by mouth daily.    . prednisoLONE acetate (PRED FORTE) 1 % ophthalmic suspension 1 drop 4 (four) times daily.    . vitamin B-12 (CYANOCOBALAMIN) 1000 MCG tablet Take 1,000 mcg by mouth daily.    [name] 10 MG TABS tablet Take 10 mg by mouth daily.    . Rivaroxaban (XARELTO) 15 MG TABS tablet Take 1 tablet (15 mg total) by mouth daily with supper. 30 tablet 3  . Multiple Vitamins-Minerals (PRESERVISION AREDS 2 PO) Take 1 tablet by mouth 2 (two) times daily.     . Polyvinyl Alcohol-Povidone (REFRESH OP) Place 1 drop into both eyes See admin instructions. Insert 1 drop into both eyes 4-5 times daily as needed for dry eyes     No facility-administered medications prior to visit.    Allergies  Allergen Reactions  . Codeine Nausea And Vomiting  . Penicillins Rash  . Sulfa Antibiotics Nausea And Vomiting    ROS Review of Systems  Constitutional: Negative.   HENT: Positive for congestion and hearing loss.   Eyes: Negative.   Respiratory: Negative.   Cardiovascular: Negative.   Gastrointestinal: Negative.   Endocrine: Negative.   Genitourinary: Negative.   Musculoskeletal: Negative.   Neurological: Negative.   Psychiatric/Behavioral: Positive for agitation.      Objective:    Physical Exam  Constitutional: She is oriented to person, place, and time. She appears well-developed and well-nourished.    HENT:  Head: Normocephalic and atraumatic.  Eyes: Pupils are equal, round, and reactive to light. Conjunctivae and EOM are normal.  Cardiovascular: Normal rate, regular rhythm and normal heart sounds.  Pulmonary/Chest: Effort normal and breath sounds normal.  Abdominal: Soft. Bowel sounds are normal.  Musculoskeletal:        General: Normal range of motion.     Cervical back: Normal range of motion and neck supple.  Neurological: She is alert and oriented to person, place, and time. She has normal reflexes.  Skin: Skin is warm and dry.  Psychiatric: She has a normal mood and affect. Her behavior is normal.  Vitals reviewed.   BP 105/70   Pulse 87   Temp (!) 97.1 F (36.2 C)  Ht 5\' 3"  (1.6 m)   Wt 106 lb 6.4 oz (48.3 kg)   SpO2 96%   BMI 18.85 kg/m  Wt Readings from Last 3 Encounters:  10/17/19 106 lb 6.4 oz (48.3 kg)  04/18/19 107 lb 6.4 oz (48.7 kg)  01/10/19 108 lb (49 kg)     Health Maintenance Due  Topic Date Due  . TETANUS/TDAP  06/06/1947  . DEXA SCAN  06/05/1993  . PNA vac Low Risk Adult (2 of 2 - PCV13) 08/31/2019    There are no preventive care reminders to display for this patient.  No results found for: TSH Lab Results  Component Value Date   WBC 6.1 10/10/2019   HGB 13.1 10/10/2019   HCT 37.4 10/10/2019   MCV 98 (H) 10/10/2019   PLT 237 10/10/2019   Lab Results  Component Value Date   NA 140 10/10/2019   K 4.5 10/10/2019   CO2 23 10/10/2019   GLUCOSE 90 10/10/2019   BUN 17 10/10/2019   CREATININE 0.79 10/10/2019   BILITOT 0.5 10/10/2019   ALKPHOS 73 10/10/2019   AST 19 10/10/2019   ALT 15 10/10/2019   PROT 6.8 10/10/2019   ALBUMIN 4.2 10/10/2019   CALCIUM 9.6 10/10/2019   ANIONGAP 6 11/09/2018   Lab Results  Component Value Date   CHOL 213 (H) 10/10/2019   Lab Results  Component Value Date   HDL 58 10/10/2019   Lab Results  Component Value Date   LDLCALC 136 (H) 10/10/2019   Lab Results  Component Value Date   TRIG 109  10/10/2019   Lab Results  Component Value Date   CHOLHDL 3.7 10/10/2019   Lab Results  Component Value Date   HGBA1C 5.3 11/09/2018      Assessment & Plan:   Problem List Items Addressed This Visit      Cardiovascular and Mediastinum   Benign hypertension    An individual care plan was established and reinforced today.  The patient's status was assessed using clinical findings on exam and labs or diagnostic tests. The patient's success at meeting treatment goals on disease specific evidence-based guidelines and found to be well controlled. SELF MANAGEMENT: The patient and I together assessed ways to personally work towards obtaining the recommended goals. RECOMMENDATIONS: avoid decongestants found in common cold remedies, decrease consumption of alcohol, perform routine monitoring of BP with home BP cuff, exercise, reduction of dietary salt, take medicines as prescribed, try not to miss doses and quit smoking.  Regular exercise and maintaining a healthy weight is needed.  Stress reduction may help. A CLINICAL SUMMARY including written plan identify barriers to care unique to individual due to social or financial issues.  We attempt to mutually creat solutions for individual and family understanding.      Relevant Medications   XARELTO 10 MG TABS tablet   Other Relevant Orders   CBC with Differential   Comprehensive metabolic panel     Musculoskeletal and Integument   Apraxia following cerebrovascular accident    AN INDIVIDUAL CARE PLAN was established and reinforced today.  The patient's status was assessed using clinical findings on exam, labs, and other diagnostic testing. Patient's success at meeting treatment goals based on disease specific evidence-bassed guidelines and found to be in fair control. RECOMMENDATIONS include maintaining present medicines and treatment.        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  The  patient's status was assessed using clinical findings on exam, labs, and other diagnostic testing. Patient's success at meeting treatment goals based on disease specific evidence-bassed guidelines and found to be in fair control. RECOMMENDATIONS include maintaining present medicines and treatment. Still has some apraxia      Adjustment disorder with depressed mood    AN INDIVIDUAL CARE PLAN was established and reinforced today.  The patient's status was assessed using clinical findings on exam, labs, and other diagnostic testing. Patient's success at meeting treatment goals based on disease specific evidence-bassed guidelines and found to be in fair control. RECOMMENDATIONS include maintaing present medicines and treatment.      B12 deficiency    AN INDIVIDUAL CARE PLAN was established and reinforced today.  The patient's status was assessed using clinical findings on exam, labs, and other diagnostic testing. Patient's success at meeting treatment goals based on disease specific evidence-bassed guidelines and found to be in good control. RECOMMENDATIONS include maintaining present medicines and treatment. She is still taking Vitamin B12         No orders of the defined types were placed in this encounter.   Follow-up: Return in about 4 weeks (around 11/14/2019) for fasting.    Reinaldo Meeker, MD

## 2019-10-17 NOTE — Assessment & Plan Note (Signed)
AN INDIVIDUAL CARE PLAN was established and reinforced today.  The patient's status was assessed using clinical findings on exam, labs, and other diagnostic testing. Patient's success at meeting treatment goals based on disease specific evidence-bassed guidelines and found to be in fair control. RECOMMENDATIONS include maintaing present medicines and treatment.

## 2019-10-17 NOTE — Assessment & Plan Note (Signed)
AN INDIVIDUAL CARE PLAN was established and reinforced today.  The patient's status was assessed using clinical findings on exam, labs, and other diagnostic testing. Patient's success at meeting treatment goals based on disease specific evidence-bassed guidelines and found to be in fair control. RECOMMENDATIONS include maintaining present medicines and treatment. Still has some apraxia

## 2019-10-17 NOTE — Assessment & Plan Note (Signed)

## 2019-10-17 NOTE — Assessment & Plan Note (Signed)
AN INDIVIDUAL CARE PLAN was established and reinforced today.  The patient's status was assessed using clinical findings on exam, labs, and other diagnostic testing. Patient's success at meeting treatment goals based on disease specific evidence-bassed guidelines and found to be in good control. RECOMMENDATIONS include maintaining present medicines and treatment. She is still taking Vitamin B12

## 2019-10-19 ENCOUNTER — Other Ambulatory Visit: Payer: Self-pay | Admitting: *Deleted

## 2019-10-19 MED ORDER — ACEBUTOLOL HCL 200 MG PO CAPS: 200.0000 mg | ORAL_CAPSULE | Freq: Every day | ORAL | 0 refills | Status: DC

## 2019-10-20 ENCOUNTER — Ambulatory Visit: Payer: PPO

## 2019-10-21 ENCOUNTER — Ambulatory Visit (INDEPENDENT_AMBULATORY_CARE_PROVIDER_SITE_OTHER): Payer: PPO | Admitting: Cardiology

## 2019-10-21 ENCOUNTER — Other Ambulatory Visit: Payer: Self-pay

## 2019-10-21 ENCOUNTER — Encounter: Payer: Self-pay | Admitting: Cardiology

## 2019-10-21 VITALS — BP 108/62 | HR 84 | Temp 97.5°F | Ht 63.0 in | Wt 107.0 lb

## 2019-10-21 DIAGNOSIS — E7800 Pure hypercholesterolemia, unspecified: Secondary | ICD-10-CM | POA: Diagnosis not present

## 2019-10-21 DIAGNOSIS — J449 Chronic obstructive pulmonary disease, unspecified: Secondary | ICD-10-CM

## 2019-10-21 DIAGNOSIS — Z7901 Long term (current) use of anticoagulants: Secondary | ICD-10-CM

## 2019-10-21 DIAGNOSIS — I4819 Other persistent atrial fibrillation: Secondary | ICD-10-CM

## 2019-10-21 MED ORDER — ACEBUTOLOL HCL 200 MG PO CAPS: 200.0000 mg | ORAL_CAPSULE | Freq: Every day | ORAL | 4 refills | Status: DC

## 2019-10-21 NOTE — Patient Instructions (Signed)
Medication Instructions:  Your physician recommends that you continue on your current medications as directed. Please refer to the Current Medication list given to you today.  *If you need a refill on your cardiac medications before your next appointment, please call your pharmacy*  Lab Work: NONE If you have labs (blood work) drawn today and your tests are completely normal, you will receive your results only by: . MyChart Message (if you have MyChart) OR . A paper copy in the mail If you have any lab test that is abnormal or we need to change your treatment, we will call you to review the results.  Testing/Procedures: NONE  Follow-Up: At [facility], you and your health needs are our priority.  As part of our continuing mission to provide you with exceptional heart care, we have created designated Provider Care Teams.  These Care Teams include your primary Cardiologist (physician) and Advanced Practice Providers (APPs -  Physician Assistants and Nurse Practitioners) who all work together to provide you with the care you need, when you need it.  Your next appointment:   12 month(s)  The format for your next appointment:   In Person  Provider:   Brian Munley, MD  Other Instructions   

## 2019-10-21 NOTE — Progress Notes (Signed)
Cardiology Office Note:    Date:  10/21/2019   ID:  [name], DOB 29-Aug-1926, MRN [id number]  PCP:  [name], MD  Cardiologist:  Shirlee More, MD    Referring MD: [name],*    ASSESSMENT:    1. Persistent atrial fibrillation ([place])   2. Current use of long term anticoagulation   3. Chronic obstructive pulmonary disease, unspecified COPD type ([place])    PLAN:    In order of problems listed above:  1. Stable atrial fibrillation maintaining sinus rhythm continue beta-blocker anticoagulant 2. Continue anticoagulant she has had no bleeding complication and she is a very high risk of stroke recurrence 3. Stable asymptomatic 4. Hyperlipidemia stable continue pravastatin   Next appointment: 1 year   Medication Adjustments/Labs and Tests Ordered: Current medicines are reviewed at length with the patient today.  Concerns regarding medicines are outlined above.  No orders of the defined types were placed in this encounter.  No orders of the defined types were placed in this encounter.   Chief Complaint  Patient presents with  . Atrial Fibrillation  . Anticoagulation    History of Present Illness:    [name] is a 86 y.o. female with a hx of atrial fibrillation on anticoagulation, PAT, DLD ,COPD, CVA (11/08/18) L middle cerebral artery after Xarelto withdrawn for eye surgery last seen 11/16/18. Echo 11/09/18 normal LV size and function, diastolic function indeterminite due to atrial fibrillation, mod LA and RA enlargement, mod TR. [facility]A chest (done at [facility]) shows extensive mural thrombus and atherosclerosis in the distal arch and descending thoracic aorta.   She was last seen 04/18/2019 and appeared to have made a full recovery from her stroke. Compliance with diet, lifestyle and medications: Yes  In general she is done very well sadly her son-in-law is in the hospital with severe Covid pneumonia.  She has had full immunization.  She has had  no recurrent atrial fibrillation tolerates her beta-blocker no chest pain shortness of breath palpitation or syncope compliant with her statin without muscle pain or weakness and no bleeding complication she enjoys life and is happy.  Recent labs 10/10/2019 cholesterol 213 LDL 128 HDL 58 I think this is a good result individual 86 years of age A1c normal at 5.3 creatinine normal.  Past Medical History:  Diagnosis Date  . Closed fracture of one rib of right side with routine healing 06/30/2017  . Current use of long term anticoagulation 07/21/2017  . Depression   . First degree atrioventricular block 06/26/2017  . Hammer toe 06/26/2017  . Hyperlipidemia 06/26/2017  . Other specified cardiac arrhythmias 06/26/2017  . Pain in limb 06/26/2017  . Palpitations 06/26/2017  . Paroxysmal atrial fibrillation ([place]) 06/26/2017   Overview:  remote history post operatively, CHADS 2 score=2 Overview:  Overview:  remote history post operatively, CHADS 2 score=2  . PAT (paroxysmal atrial tachycardia) ([place]) 06/14/2015  . Premature atrial complex 06/26/2017  . Premature ventricular contractions 06/26/2017  . PSVT (paroxysmal supraventricular tachycardia) ([place]) 11/10/2017  . Stroke ([place])   . Traumatic pneumothorax 06/30/2017    Past Surgical History:  Procedure Laterality Date  . ABDOMINAL HYSTERECTOMY    . SHOULDER SURGERY    . TONSILLECTOMY      Current Medications: Current Meds  Medication Sig  . acebutolol (SECTRAL) 200 MG capsule Take 1 capsule (200 mg total) by mouth daily.  . Cholecalciferol (VITAMIN D3) 1000 units CAPS Take 1,000 Units by mouth daily.  . Polyethylene Glycol  3350 (PEG 3350) POWD Take 17 g by mouth daily as needed (bowel movement).   . pravastatin (PRAVACHOL) 40 MG tablet Take 40 mg by mouth daily.  . prednisoLONE acetate (PRED FORTE) 1 % ophthalmic suspension 1 drop 4 (four) times daily.  . vitamin B-12 (CYANOCOBALAMIN) 1000 MCG tablet Take 1,000 mcg by mouth daily.  [name] 10 MG TABS tablet Take 10 mg by mouth daily.     Allergies:   Codeine, Penicillins, and Sulfa antibiotics   Social History   Socioeconomic History  . Marital status: Widowed    Spouse name: Not on file  . Number of children: 2  . Years of education: Not on file  . Highest education level: Not on file  Occupational History  . Not on file  Tobacco Use  . Smoking status: Never Smoker  . Smokeless tobacco: Never Used  Substance and Sexual Activity  . Alcohol use: No  . Drug use: No  . Sexual activity: Not Currently  Other Topics Concern  . Not on file  Social History Narrative   Mrs. Panek is widowed. Her daughter, [name], is her POA and lives 12 miles away. She resides in her own home, alone for last 7 years. Since her CVA she has had someone with her most of the time. She has a hired caregiver or a family member stays most of the day. She feel safe in her home and in her neighborhood.   Social Determinants of Health   Financial Resource Strain: Low Risk   . Difficulty of Paying Living Expenses: Not hard at all  Food Insecurity: No Food Insecurity  . Worried About Programme researcher, broadcasting/film/video in the Last Year: Never true  . Ran Out of Food in the Last Year: Never true  Transportation Needs: No Transportation Needs  . Lack of Transportation (Medical): No  . Lack of Transportation (Non-Medical): No  Physical Activity: Inactive  . Days of Exercise per Week: 0 days  . Minutes of Exercise per Session: 0 min  Stress: No Stress Concern Present  . Feeling of Stress : Not at all  Social Connections: Somewhat Isolated  . Frequency of Communication with Friends and Family: More than three times a week  . Frequency of Social Gatherings with Friends and Family: More than three times a week  . Attends Religious Services: More than 4 times per year  . Active Member of Clubs or Organizations: No  . Attends Banker Meetings: Never  . Marital Status: Widowed      Family History: The patient's family history includes Bone cancer in her mother; Breast cancer in her father and sister; Diabetes in her father; Heart disease in her father. ROS:   Please see the history of present illness.    All other systems reviewed and are negative.  EKGs/Labs/Other Studies Reviewed:    The following studies were reviewed today:  EKG:  EKG ordered today and personally reviewed.  The ekg ordered today demonstrates sinus rhythm first-degree heart block otherwise normal  Recent Labs: 10/10/2019: ALT 15; BUN 17; Creatinine, Ser 0.79; Hemoglobin 13.1; Platelets 237; Potassium 4.5; Sodium 140  Recent Lipid Panel    Component Value Date/Time   CHOL 213 (H) 10/10/2019 0906   TRIG 109 10/10/2019 0906   HDL 58 10/10/2019 0906   CHOLHDL 3.7 10/10/2019 0906   CHOLHDL 4.3 11/09/2018 0512   VLDL 11 11/09/2018 0512   LDLCALC 136 (H) 10/10/2019 0906    Physical Exam:  VS:  BP 108/62   Pulse 84   Temp (!) 97.5 F (36.4 C)   Ht 5\' 3"  (1.6 m)   Wt 107 lb (48.5 kg)   LMP  (LMP Unknown)   SpO2 97%   BMI 18.95 kg/m     Wt Readings from Last 3 Encounters:  10/21/19 107 lb (48.5 kg)  10/17/19 106 lb 6.4 oz (48.3 kg)  04/18/19 107 lb 6.4 oz (48.7 kg)     GEN:  Well nourished, well developed in no acute distress HEENT: Normal NECK: No JVD; No carotid bruits LYMPHATICS: No lymphadenopathy CARDIAC: RRR, no murmurs, rubs, gallops RESPIRATORY:  Clear to auscultation without rales, wheezing or rhonchi  ABDOMEN: Soft, non-tender, non-distended MUSCULOSKELETAL:  No edema; No deformity  SKIN: Warm and dry NEUROLOGIC:  Alert and oriented x 3 PSYCHIATRIC:  Normal affect    Signed, 04/20/19, MD  10/21/2019 3:10 PM    [facility]

## 2019-10-24 ENCOUNTER — Ambulatory Visit (INDEPENDENT_AMBULATORY_CARE_PROVIDER_SITE_OTHER): Payer: PPO

## 2019-10-24 ENCOUNTER — Other Ambulatory Visit: Payer: Self-pay

## 2019-10-24 DIAGNOSIS — H918X3 Other specified hearing loss, bilateral: Secondary | ICD-10-CM

## 2019-10-24 DIAGNOSIS — H6123 Impacted cerumen, bilateral: Secondary | ICD-10-CM | POA: Diagnosis not present

## 2019-10-24 NOTE — Progress Notes (Signed)
[name] came in today to have her ears cleaned out.  She states that she is having a harder time hearing recently and her hearing aid specialist told her she needed her ears cleaned out.  She has been using Debrox for the last three days.  Moderate wax came from the left ear with minimal irrigation.  There was minimal wax in the right ear which was cleared using irrigation.  Patient tolerated well.

## 2019-10-24 NOTE — Patient Instructions (Addendum)
Continue Debrox drops as needed to soften ear wax.  Do not use Q-Tips as they push wax deeper.

## 2019-10-25 ENCOUNTER — Other Ambulatory Visit: Payer: Self-pay

## 2019-10-25 DIAGNOSIS — E782 Mixed hyperlipidemia: Secondary | ICD-10-CM

## 2019-10-25 MED ORDER — PRAVASTATIN SODIUM 40 MG PO TABS: 40.0000 mg | ORAL_TABLET | Freq: Every day | ORAL | 1 refills | Status: DC

## 2019-11-14 DIAGNOSIS — H2703 Aphakia, bilateral: Secondary | ICD-10-CM | POA: Diagnosis not present

## 2019-11-14 DIAGNOSIS — H3530 Unspecified macular degeneration: Secondary | ICD-10-CM | POA: Diagnosis not present

## 2019-11-17 ENCOUNTER — Other Ambulatory Visit: Payer: Self-pay | Admitting: Legal Medicine

## 2019-11-29 ENCOUNTER — Other Ambulatory Visit: Payer: Self-pay

## 2019-11-29 ENCOUNTER — Ambulatory Visit (INDEPENDENT_AMBULATORY_CARE_PROVIDER_SITE_OTHER): Payer: PPO | Admitting: Legal Medicine

## 2019-11-29 ENCOUNTER — Encounter: Payer: Self-pay | Admitting: Legal Medicine

## 2019-11-29 VITALS — BP 100/60 | HR 93 | Temp 97.5°F | Resp 16 | Ht 63.39 in | Wt 107.4 lb

## 2019-11-29 DIAGNOSIS — G4452 New daily persistent headache (NDPH): Secondary | ICD-10-CM

## 2019-11-29 DIAGNOSIS — G4451 Hemicrania continua: Secondary | ICD-10-CM | POA: Diagnosis not present

## 2019-11-29 DIAGNOSIS — R5383 Other fatigue: Secondary | ICD-10-CM | POA: Diagnosis not present

## 2019-11-29 NOTE — Assessment & Plan Note (Addendum)
Patient is having severe headache that is nonfocal.  She has no history of headaches.  She has positive kernig and is diffusely weak.  No focal signs. romberg is unstable. She needs ct head  For possible  SAH or lacunar stroke.  Blood is being checked for metabolic defects.  She was unable to get urine for analysis. She had strokes in past.

## 2019-11-29 NOTE — Progress Notes (Signed)
Acute Office Visit  Subjective:    Patient ID: [name], female    DOB: [birth year], 86 y.o.   MRN: [id number]  Chief Complaint  Patient presents with  . Headache    Since 2 days ago    HPI Patient is in today for She has had exhaustion and severe headache for one week.  No focal neurologic signs.  No vomiting or blurred vision.  No chest pain.  She able to hoe and use matic over weekend.  No coordination problems. No falls, no blurred vision. She  has a still neck, positive kernig. No falls.  She is on xarelto chronically possible bleed.  She needs CT scan without contrast.  Past Medical History:  Diagnosis Date  . Closed fracture of one rib of right side with routine healing 06/30/2017  . Current use of long term anticoagulation 07/21/2017  . Depression   . First degree atrioventricular block 06/26/2017  . Hammer toe 06/26/2017  . Hyperlipidemia 06/26/2017  . Other specified cardiac arrhythmias 06/26/2017  . Pain in limb 06/26/2017  . Palpitations 06/26/2017  . Paroxysmal atrial fibrillation (HCC) 06/26/2017   Overview:  remote history post operatively, CHADS 2 score=2 Overview:  Overview:  remote history post operatively, CHADS 2 score=2  . PAT (paroxysmal atrial tachycardia) (HCC) 06/14/2015  . Premature atrial complex 06/26/2017  . Premature ventricular contractions 06/26/2017  . PSVT (paroxysmal supraventricular tachycardia) (HCC) 11/10/2017  . Stroke (HCC)   . Traumatic pneumothorax 06/30/2017    Past Surgical History:  Procedure Laterality Date  . ABDOMINAL HYSTERECTOMY    . SHOULDER SURGERY    . TONSILLECTOMY      Family History  Problem Relation Age of Onset  . Bone cancer Mother   . Breast cancer Father   . Diabetes Father   . Heart disease Father   . Breast cancer Sister     Social History   Socioeconomic History  . Marital status: Widowed    Spouse name: Not on file  . Number of children: 2  . Years of education: Not on file  . Highest  education level: Not on file  Occupational History  . Not on file  Tobacco Use  . Smoking status: Never Smoker  . Smokeless tobacco: Never Used  Substance and Sexual Activity  . Alcohol use: No  . Drug use: No  . Sexual activity: Not Currently  Other Topics Concern  . Not on file  Social History Narrative   [name] is widowed. Her daughter, [name], is her POA and lives 12 miles away. She resides in her own home, alone for last 7 years. Since her CVA she has had someone with her most of the time. She has a hired caregiver or a family member stays most of the day. She feel safe in her home and in her neighborhood.   Social Determinants of Health   Financial Resource Strain: Low Risk   . Difficulty of Paying Living Expenses: Not hard at all  Food Insecurity: No Food Insecurity  . Worried About Programme researcher, broadcasting/film/video in the Last Year: Never true  . Ran Out of Food in the Last Year: Never true  Transportation Needs: No Transportation Needs  . Lack of Transportation (Medical): No  . Lack of Transportation (Non-Medical): No  Physical Activity: Inactive  . Days of Exercise per Week: 0 days  . Minutes of Exercise per Session: 0 min  Stress: No Stress Concern Present  . Feeling of Stress :  Not at all  Social Connections: Somewhat Isolated  . Frequency of Communication with Friends and Family: More than three times a week  . Frequency of Social Gatherings with Friends and Family: More than three times a week  . Attends Religious Services: More than 4 times per year  . Active Member of Clubs or Organizations: No  . Attends Banker Meetings: Never  . Marital Status: Widowed  Intimate Partner Violence:   . Fear of Current or Ex-Partner:   . Emotionally Abused:   [name] Physically Abused:   . Sexually Abused:     Outpatient Medications Prior to Visit  Medication Sig Dispense Refill  . acebutolol (SECTRAL) 200 MG capsule Take 1 capsule (200 mg total) by mouth daily. 90  capsule 4  . Cholecalciferol (VITAMIN D3) 1000 units CAPS Take 1,000 Units by mouth daily.    . mirtazapine (REMERON) 15 MG tablet Take 15 mg by mouth daily.    . pravastatin (PRAVACHOL) 40 MG tablet Take 1 tablet (40 mg total) by mouth daily. 90 tablet 1  . prednisoLONE acetate (PRED FORTE) 1 % ophthalmic suspension 1 drop 4 (four) times daily.    . vitamin B-12 (CYANOCOBALAMIN) 1000 MCG tablet Take 1,000 mcg by mouth daily.    [name] 10 MG TABS tablet TAKE 1 TABLET BY MOUTH EVERY DAY 30 tablet 6  . Polyethylene Glycol 3350 (PEG 3350) POWD Take 17 g by mouth daily as needed (bowel movement).     . mirtazapine (REMERON) 30 MG tablet Take 30 mg by mouth at bedtime.     No facility-administered medications prior to visit.    Allergies  Allergen Reactions  . Codeine Nausea And Vomiting  . Penicillins Rash  . Sulfa Antibiotics Nausea And Vomiting    Review of Systems  Constitutional: Negative.   HENT: Negative.   Eyes: Negative.   Respiratory: Negative.   Cardiovascular: Negative.   Gastrointestinal: Negative.   Endocrine: Negative.   Genitourinary: Negative.   Musculoskeletal: Positive for neck pain.  Neurological: Negative.   Psychiatric/Behavioral: Negative.        Objective:    Physical Exam Constitutional:      Appearance: She is well-developed.  HENT:     Head: Normocephalic and atraumatic.  Neck:     Vascular: Normal carotid pulses. No carotid bruit, hepatojugular reflux or JVD.     Comments: Positive Kernig sign, negative Lhermitte. Cardiovascular:     Rate and Rhythm: Normal rate and regular rhythm.     Pulses: Normal pulses.     Heart sounds: Normal heart sounds.  Pulmonary:     Effort: Pulmonary effort is normal.     Breath sounds: Normal breath sounds.  Abdominal:     General: Bowel sounds are normal.     Palpations: Abdomen is soft.  Musculoskeletal:        General: Normal range of motion.     Cervical back: Rigidity and crepitus present. Pain with  movement present.  Neurological:     Mental Status: She is oriented to person, place, and time. She is lethargic.     Cranial Nerves: Cranial nerves are intact.     Sensory: Sensation is intact.     Motor: Weakness present.     Coordination: Romberg sign positive.     Gait: Gait abnormal.     Deep Tendon Reflexes: Babinski sign absent on the right side. Babinski sign absent on the left side.     Reflex Scores:  Tricep reflexes are 2+ on the right side and 2+ on the left side.      Bicep reflexes are 2+ on the right side and 2+ on the left side.      Brachioradialis reflexes are 2+ on the right side and 2+ on the left side.      Patellar reflexes are 2+ on the right side and 2+ on the left side.      Achilles reflexes are 2+ on the right side and 2+ on the left side. Psychiatric:        Mood and Affect: Mood normal.     BP 100/60 (BP Location: Right Arm, Patient Position: Sitting)   Pulse 93   Temp (!) 97.5 F (36.4 C) (Temporal)   Resp 16   Ht 5' 3.39" (1.61 m)   Wt 107 lb 6.4 oz (48.7 kg)   LMP  (LMP Unknown)   SpO2 98%   BMI 18.79 kg/m  Wt Readings from Last 3 Encounters:  11/29/19 107 lb 6.4 oz (48.7 kg)  10/21/19 107 lb (48.5 kg)  10/17/19 106 lb 6.4 oz (48.3 kg)    Health Maintenance Due  Topic Date Due  . TETANUS/TDAP  Never done  . DEXA SCAN  Never done  . PNA vac Low Risk Adult (2 of 2 - PCV13) 08/31/2019    There are no preventive care reminders to display for this patient.   No results found for: TSH Lab Results  Component Value Date   WBC 6.1 10/10/2019   HGB 13.1 10/10/2019   HCT 37.4 10/10/2019   MCV 98 (H) 10/10/2019   PLT 237 10/10/2019   Lab Results  Component Value Date   NA 140 10/10/2019   K 4.5 10/10/2019   CO2 23 10/10/2019   GLUCOSE 90 10/10/2019   BUN 17 10/10/2019   CREATININE 0.79 10/10/2019   BILITOT 0.5 10/10/2019   ALKPHOS 73 10/10/2019   AST 19 10/10/2019   ALT 15 10/10/2019   PROT 6.8 10/10/2019   ALBUMIN 4.2  10/10/2019   CALCIUM 9.6 10/10/2019   ANIONGAP 6 11/09/2018   Lab Results  Component Value Date   CHOL 213 (H) 10/10/2019   Lab Results  Component Value Date   HDL 58 10/10/2019   Lab Results  Component Value Date   LDLCALC 136 (H) 10/10/2019   Lab Results  Component Value Date   TRIG 109 10/10/2019   Lab Results  Component Value Date   CHOLHDL 3.7 10/10/2019   Lab Results  Component Value Date   HGBA1C 5.3 11/09/2018       Assessment & Plan:   Problem List Items Addressed This Visit      Other   Fatigue    Patient has been extremely fatigued for 4 days.  No focal sign, metabolic cause is being checked.      Relevant Orders   CBC with Differential   Comprehensive metabolic panel    Other Visit Diagnoses    New daily persistent headache    -  Primary   Relevant Orders   CBC with Differential   POCT URINALYSIS DIP (CLINITEK)   Comprehensive metabolic panel   Hemicrania continua       Relevant Orders   CT Head Wo Contrast    Patient is having severe headache that is nonfocal.  She has no history of headaches.  She has positive kernig and is diffusely weak.  No focal signs. romberg is unstable. She needs ct head  For possible  SAH or lacunar stroke.  Blood is being checked for metabolic defects.  She was unable to get urine for analysis. She had strokes in past.    No orders of the defined types were placed in this encounter.    Reinaldo Meeker, MD

## 2019-11-29 NOTE — Assessment & Plan Note (Signed)
Patient has been extremely fatigued for 4 days.  No focal sign, metabolic cause is being checked.

## 2019-11-30 ENCOUNTER — Telehealth: Payer: Self-pay | Admitting: Legal Medicine

## 2019-11-30 LAB — COMPREHENSIVE METABOLIC PANEL
ALT: 9 IU/L (ref 0–32)
AST: 15 IU/L (ref 0–40)
Albumin/Globulin Ratio: 1.7 (ref 1.2–2.2)
Albumin: 4 g/dL (ref 3.5–4.6)
Alkaline Phosphatase: 58 IU/L (ref 39–117)
BUN/Creatinine Ratio: 23 (ref 12–28)
BUN: 27 mg/dL (ref 10–36)
Bilirubin Total: <0.2 mg/dL (ref 0.0–1.2)
CO2: 24 mmol/L (ref 20–29)
Calcium: 9.4 mg/dL (ref 8.7–10.3)
Chloride: 102 mmol/L (ref 96–106)
Creatinine, Ser: 1.15 mg/dL — ABNORMAL HIGH (ref 0.57–1.00)
GFR calc Af Amer: 48 mL/min/{1.73_m2} — ABNORMAL LOW (ref >59)
GFR calc non Af Amer: 42 mL/min/{1.73_m2} — ABNORMAL LOW (ref >59)
Globulin, Total: 2.4 g/dL (ref 1.5–4.5)
Glucose: 106 mg/dL — ABNORMAL HIGH (ref 65–99)
Potassium: 4.8 mmol/L (ref 3.5–5.2)
Sodium: 137 mmol/L (ref 134–144)
Total Protein: 6.4 g/dL (ref 6.0–8.5)

## 2019-11-30 LAB — CBC WITH DIFFERENTIAL/PLATELET
Basophils Absolute: 0.1 10*3/uL (ref 0.0–0.2)
Basos: 1 %
EOS (ABSOLUTE): 0.1 10*3/uL (ref 0.0–0.4)
Eos: 2 %
Hematocrit: 36.5 % (ref 34.0–46.6)
Hemoglobin: 12.4 g/dL (ref 11.1–15.9)
Immature Grans (Abs): 0 10*3/uL (ref 0.0–0.1)
Immature Granulocytes: 0 %
Lymphocytes Absolute: 2.1 10*3/uL (ref 0.7–3.1)
Lymphs: 36 %
MCH: 34.7 pg — ABNORMAL HIGH (ref 26.6–33.0)
MCHC: 34 g/dL (ref 31.5–35.7)
MCV: 102 fL — ABNORMAL HIGH (ref 79–97)
Monocytes Absolute: 0.7 10*3/uL (ref 0.1–0.9)
Monocytes: 12 %
Neutrophils Absolute: 2.8 10*3/uL (ref 1.4–7.0)
Neutrophils: 49 %
Platelets: 250 10*3/uL (ref 150–450)
RBC: 3.57 x10E6/uL — ABNORMAL LOW (ref 3.77–5.28)
RDW: 12.6 % (ref 11.7–15.4)
WBC: 5.8 10*3/uL (ref 3.4–10.8)

## 2019-11-30 NOTE — Progress Notes (Signed)
°  Chronic Care Management   Outreach Note  11/30/2019 Name: [name] MRN: [id number] DOB: May 03, 1926  Referred by: Abigail Miyamoto, MD Reason for referral : No chief complaint on file.   An unsuccessful telephone outreach was attempted today. The patient was referred to the pharmacist for assistance with care management and care coordination.   Follow Up Plan:   Lynnae January Upstream Scheduler

## 2019-11-30 NOTE — Progress Notes (Signed)
No anemia, glucose 106, kidney tests lower,  lp

## 2019-12-01 DIAGNOSIS — G4451 Hemicrania continua: Secondary | ICD-10-CM | POA: Diagnosis not present

## 2019-12-02 ENCOUNTER — Other Ambulatory Visit: Payer: Self-pay

## 2019-12-02 DIAGNOSIS — G4451 Hemicrania continua: Secondary | ICD-10-CM

## 2019-12-02 MED ORDER — AZITHROMYCIN 250 MG PO TABS: 250.0000 mg | ORAL_TABLET | Freq: Every day | ORAL | 0 refills | Status: DC

## 2019-12-06 ENCOUNTER — Ambulatory Visit (INDEPENDENT_AMBULATORY_CARE_PROVIDER_SITE_OTHER): Payer: PPO | Admitting: Legal Medicine

## 2019-12-06 ENCOUNTER — Telehealth: Payer: Self-pay | Admitting: Legal Medicine

## 2019-12-06 ENCOUNTER — Encounter: Payer: Self-pay | Admitting: Legal Medicine

## 2019-12-06 ENCOUNTER — Other Ambulatory Visit: Payer: Self-pay

## 2019-12-06 VITALS — BP 100/60 | HR 70 | Temp 98.4°F | Resp 17 | Ht 63.39 in | Wt 107.0 lb

## 2019-12-06 DIAGNOSIS — E861 Hypovolemia: Secondary | ICD-10-CM

## 2019-12-06 DIAGNOSIS — I9589 Other hypotension: Secondary | ICD-10-CM | POA: Diagnosis not present

## 2019-12-06 DIAGNOSIS — N3000 Acute cystitis without hematuria: Secondary | ICD-10-CM

## 2019-12-06 DIAGNOSIS — E440 Moderate protein-calorie malnutrition: Secondary | ICD-10-CM

## 2019-12-06 DIAGNOSIS — N182 Chronic kidney disease, stage 2 (mild): Secondary | ICD-10-CM

## 2019-12-06 DIAGNOSIS — I959 Hypotension, unspecified: Secondary | ICD-10-CM | POA: Insufficient documentation

## 2019-12-06 HISTORY — DX: Moderate protein-calorie malnutrition: E44.0

## 2019-12-06 HISTORY — DX: Chronic kidney disease, stage 2 (mild): N18.2

## 2019-12-06 LAB — POCT URINALYSIS DIP (CLINITEK)
Bilirubin, UA: NEGATIVE
Blood, UA: NEGATIVE
Glucose, UA: NEGATIVE mg/dL
Ketones, POC UA: NEGATIVE mg/dL
Nitrite, UA: POSITIVE — AB
POC PROTEIN,UA: NEGATIVE
Spec Grav, UA: 1.015 (ref 1.010–1.025)
Urobilinogen, UA: 0.2 E.U./dL
pH, UA: 6 (ref 5.0–8.0)

## 2019-12-06 MED ORDER — BD ASSURE BPM/AUTO ARM CUFF MISC: 1.0000 "application " | Freq: Every day | 0 refills | Status: DC

## 2019-12-06 MED ORDER — NITROFURANTOIN MONOHYD MACRO 100 MG PO CAPS: 100.0000 mg | ORAL_CAPSULE | Freq: Two times a day (BID) | ORAL | 0 refills | Status: DC

## 2019-12-06 NOTE — Progress Notes (Addendum)
Established Patient Office Visit  Subjective:  Patient ID: [name], female    DOB: Jan 09, 1926  Age: 86 y.o. MRN: [id number]  CC:  Chief Complaint  Patient presents with  . Dizziness    Lightheadness since one week    HPI [name] presents for [facility] AND low BP.  She is dizzy and able to move.  She is unstable on feet.  Her mentation is good.  She can easily tell me her problems and symptoms.  No focal neurologic signs.  Past Medical History:  Diagnosis Date  . Closed fracture of one rib of right side with routine healing 06/30/2017  . Current use of long term anticoagulation 07/21/2017  . Depression   . First degree atrioventricular block 06/26/2017  . Hammer toe 06/26/2017  . Hyperlipidemia 06/26/2017  . Other specified cardiac arrhythmias 06/26/2017  . Pain in limb 06/26/2017  . Palpitations 06/26/2017  . Paroxysmal atrial fibrillation (HCC) 06/26/2017   Overview:  remote history post operatively, CHADS 2 score=2 Overview:  Overview:  remote history post operatively, CHADS 2 score=2  . PAT (paroxysmal atrial tachycardia) (HCC) 06/14/2015  . Premature atrial complex 06/26/2017  . Premature ventricular contractions 06/26/2017  . PSVT (paroxysmal supraventricular tachycardia) (HCC) 11/10/2017  . Stroke (HCC)   . Traumatic pneumothorax 06/30/2017    Past Surgical History:  Procedure Laterality Date  . ABDOMINAL HYSTERECTOMY    . SHOULDER SURGERY    . TONSILLECTOMY      Family History  Problem Relation Age of Onset  . Bone cancer Mother   . Breast cancer Father   . Diabetes Father   . Heart disease Father   . Breast cancer Sister     Social History   Socioeconomic History  . Marital status: Widowed    Spouse name: Not on file  . Number of children: 2  . Years of education: Not on file  . Highest education level: Not on file  Occupational History  . Not on file  Tobacco Use  . Smoking status: Never Smoker  . Smokeless tobacco: Never Used    Substance and Sexual Activity  . Alcohol use: No  . Drug use: No  . Sexual activity: Not Currently  Other Topics Concern  . Not on file  Social History Narrative   [name] is widowed. Her daughter, [name], is her POA and lives 12 miles away. She resides in her own home, alone for last 7 years. Since her CVA she has had someone with her most of the time. She has a hired caregiver or a family member stays most of the day. She feel safe in her home and in her neighborhood.   Social Determinants of Health   Financial Resource Strain: Low Risk   . Difficulty of Paying Living Expenses: Not hard at all  Food Insecurity: No Food Insecurity  . Worried About Programme researcher, broadcasting/film/video in the Last Year: Never true  . Ran Out of Food in the Last Year: Never true  Transportation Needs: No Transportation Needs  . Lack of Transportation (Medical): No  . Lack of Transportation (Non-Medical): No  Physical Activity: Inactive  . Days of Exercise per Week: 0 days  . Minutes of Exercise per Session: 0 min  Stress: No Stress Concern Present  . Feeling of Stress : Not at all  Social Connections: Somewhat Isolated  . Frequency of Communication with Friends and Family: More than three times a week  . Frequency of Social  Gatherings with Friends and Family: More than three times a week  . Attends Religious Services: More than 4 times per year  . Active Member of Clubs or Organizations: No  . Attends Archivist Meetings: Never  . Marital Status: Widowed  Intimate Partner Violence:   . Fear of Current or Ex-Partner:   . Emotionally Abused:   [name] Physically Abused:   . Sexually Abused:     Outpatient Medications Prior to Visit  Medication Sig Dispense Refill  . acebutolol (SECTRAL) 200 MG capsule Take 1 capsule (200 mg total) by mouth daily. 90 capsule 4  . Cholecalciferol (VITAMIN D3) 1000 units CAPS Take 1,000 Units by mouth daily.    . mirtazapine (REMERON) 15 MG tablet Take 15 mg by  mouth daily.    . pravastatin (PRAVACHOL) 40 MG tablet Take 1 tablet (40 mg total) by mouth daily. 90 tablet 1  . prednisoLONE acetate (PRED FORTE) 1 % ophthalmic suspension 1 drop 4 (four) times daily.    . vitamin B-12 (CYANOCOBALAMIN) 1000 MCG tablet Take 1,000 mcg by mouth daily.    Alveda Reasons 10 MG TABS tablet TAKE 1 TABLET BY MOUTH EVERY DAY 30 tablet 6  . azithromycin (ZITHROMAX) 250 MG tablet Take 1 tablet (250 mg total) by mouth daily. Take 2 tablets the first day and then 1 tablet everyday. 6 each 0  . UNABLE TO FIND Med Name: Selenium 200mg , NAC 600mg , Blood root extract 100mg      No facility-administered medications prior to visit.    Allergies  Allergen Reactions  . Codeine Nausea And Vomiting  . Penicillins Rash  . Sulfa Antibiotics Nausea And Vomiting    ROS Review of Systems  Constitutional: Negative.   HENT: Negative.   Eyes: Negative.   Respiratory: Negative.   Cardiovascular: Negative.   Gastrointestinal: Negative.   Endocrine: Negative.   Genitourinary: Positive for dysuria.  Musculoskeletal: Negative.   Skin: Negative.   Neurological: Negative.   Psychiatric/Behavioral: Negative.       Objective:    Physical Exam  Constitutional: She is oriented to person, place, and time. She appears well-developed and well-nourished.  HENT:  Head: Normocephalic and atraumatic.  Eyes: Pupils are equal, round, and reactive to light. Conjunctivae and EOM are normal.  Cardiovascular: Normal rate, regular rhythm and normal heart sounds.  Pulmonary/Chest: Effort normal and breath sounds normal.  Musculoskeletal:     Cervical back: Neck supple.  Neurological: She is alert and oriented to person, place, and time. She has normal reflexes.  Unstable rhomberg  Vitals reviewed.   BP 100/60 (BP Location: Left Arm, Patient Position: Sitting)   Pulse 70   Temp 98.4 F (36.9 C) (Temporal)   Resp 17   Ht 5' 3.39" (1.61 m)   Wt 107 lb (48.5 kg)   LMP  (LMP Unknown)   BMI  18.72 kg/m  Wt Readings from Last 3 Encounters:  12/06/19 107 lb (48.5 kg)  11/29/19 107 lb 6.4 oz (48.7 kg)  10/21/19 107 lb (48.5 kg)     Health Maintenance Due  Topic Date Due  . TETANUS/TDAP  Never done  . DEXA SCAN  Never done  . PNA vac Low Risk Adult (2 of 2 - PCV13) 08/31/2019    There are no preventive care reminders to display for this patient.  No results found for: TSH Lab Results  Component Value Date   WBC 5.8 11/29/2019   HGB 12.4 11/29/2019   HCT 36.5 11/29/2019   MCV  102 (H) 11/29/2019   PLT 250 11/29/2019   Lab Results  Component Value Date   NA 137 11/29/2019   K 4.8 11/29/2019   CO2 24 11/29/2019   GLUCOSE 106 (H) 11/29/2019   BUN 27 11/29/2019   CREATININE 1.15 (H) 11/29/2019   BILITOT <0.2 11/29/2019   ALKPHOS 58 11/29/2019   AST 15 11/29/2019   ALT 9 11/29/2019   PROT 6.4 11/29/2019   ALBUMIN 4.0 11/29/2019   CALCIUM 9.4 11/29/2019   ANIONGAP 6 11/09/2018   Lab Results  Component Value Date   CHOL 213 (H) 10/10/2019   Lab Results  Component Value Date   HDL 58 10/10/2019   Lab Results  Component Value Date   LDLCALC 136 (H) 10/10/2019   Lab Results  Component Value Date   TRIG 109 10/10/2019   Lab Results  Component Value Date   CHOLHDL 3.7 10/10/2019   Lab Results  Component Value Date   HGBA1C 5.3 11/09/2018      Assessment & Plan:   Problem List Items Addressed This Visit      Cardiovascular and Mediastinum   Hypotension    BP on admission was 90/60, I repeated and was 100/60.  We will decrease acebutolol to qod until bp at least 120.  She will start boost.  BP check in 2 weeks with nurse        Genitourinary   Acute cystitis without hematuria - Primary    She continues to have UTI with 2+ pyuria- I will treat with macrobid.  If renal function rise, she will need to see urology.      Relevant Medications   nitrofurantoin, macrocrystal-monohydrate, (MACROBID) 100 MG capsule   Other Relevant Orders   POCT  URINALYSIS DIP (CLINITEK) (Completed)   Urine Culture   Chronic kidney disease, stage II (mild)    Recheck renal function today.  She has pyuria and we will reculture .  If renal function rising, she will need renal workup.      Relevant Orders   Comprehensive metabolic panel     Other   Malnutrition of moderate degree (HCC)    Patient continue to lose some weight but has stablized.  She is to use boost to supplement nutrition.         Meds ordered this encounter  Medications  . Blood Pressure Monitoring (B-D ASSURE BPM/AUTO ARM CUFF) MISC    Sig: 1 application by Does not apply route daily. i10    Dispense:  1 each    Refill:  0  . nitrofurantoin, macrocrystal-monohydrate, (MACROBID) 100 MG capsule    Sig: Take 1 capsule (100 mg total) by mouth 2 (two) times daily.    Dispense:  14 capsule    Refill:  0    Follow-up: Return in about 2 weeks (around 12/20/2019), or nurse check BP.    Brent Bulla, MD

## 2019-12-06 NOTE — Assessment & Plan Note (Signed)
She continues to have UTI with 2+ pyuria- I will treat with macrobid.  If renal function rise, she will need to see urology.

## 2019-12-06 NOTE — Assessment & Plan Note (Signed)
Recheck renal function today.  She has pyuria and we will reculture .  If renal function rising, she will need renal workup.

## 2019-12-06 NOTE — Patient Instructions (Signed)
Take acebutolol every other day until BP comes up See nurse 2 weeks for BP Check BP at home, I sent in BP cuff order. Take macrobid twice a day for urine infection Take boot with each meal to improve nutrition

## 2019-12-06 NOTE — Assessment & Plan Note (Signed)
BP on admission was 90/60, I repeated and was 100/60.  We will decrease acebutolol to qod until bp at least 120.  She will start boost.  BP check in 2 weeks with nurse

## 2019-12-06 NOTE — Progress Notes (Signed)
  Chronic Care Management   Outreach Note  12/06/2019 Name: [name] MRN: [id number] DOB: [birth year]  Referred by: Abigail Miyamoto, MD Reason for referral : No chief complaint on file.   An unsuccessful telephone outreach was attempted today. The patient was referred to the pharmacist for assistance with care management and care coordination.   Follow Up Plan:   Lynnae January Upstream Scheduler

## 2019-12-06 NOTE — Assessment & Plan Note (Signed)
Patient continue to lose some weight but has stablized.  She is to use boost to supplement nutrition.

## 2019-12-07 ENCOUNTER — Telehealth: Payer: Self-pay | Admitting: Legal Medicine

## 2019-12-07 LAB — COMPREHENSIVE METABOLIC PANEL
ALT: 11 IU/L (ref 0–32)
AST: 23 IU/L (ref 0–40)
Albumin/Globulin Ratio: 1.7 (ref 1.2–2.2)
Albumin: 4.3 g/dL (ref 3.5–4.6)
Alkaline Phosphatase: 52 IU/L (ref 39–117)
BUN/Creatinine Ratio: 21 (ref 12–28)
BUN: 18 mg/dL (ref 10–36)
Bilirubin Total: 0.4 mg/dL (ref 0.0–1.2)
CO2: 23 mmol/L (ref 20–29)
Calcium: 9.6 mg/dL (ref 8.7–10.3)
Chloride: 103 mmol/L (ref 96–106)
Creatinine, Ser: 0.85 mg/dL (ref 0.57–1.00)
GFR calc Af Amer: 69 mL/min/{1.73_m2} (ref >59)
GFR calc non Af Amer: 60 mL/min/{1.73_m2} (ref >59)
Globulin, Total: 2.5 g/dL (ref 1.5–4.5)
Glucose: 84 mg/dL (ref 65–99)
Potassium: 4.8 mmol/L (ref 3.5–5.2)
Sodium: 141 mmol/L (ref 134–144)
Total Protein: 6.8 g/dL (ref 6.0–8.5)

## 2019-12-07 NOTE — Progress Notes (Signed)
  Chronic Care Management   Note  12/07/2019 Name: KERRILYNN DERENZO MRN: [id number] DOB: 16-Feb-1926  MARETA CHESNUT is a 86 y.o. year old female who is a primary care patient of Abigail Miyamoto, MD. I reached out to Clyda Hurdle by phone today in response to a referral sent by Ms. Atiya S Loureiro's PCP, Abigail Miyamoto, MD.   Ms. Watson was given information about Chronic Care Management services today including:  1. CCM service includes personalized support from designated clinical staff supervised by her physician, including individualized plan of care and coordination with other care providers 2. 24/7 contact phone numbers for assistance for urgent and routine care needs. 3. Service will only be billed when office clinical staff spend 20 minutes or more in a month to coordinate care. 4. Only one practitioner may furnish and bill the service in a calendar month. 5. The patient may stop CCM services at any time (effective at the end of the month) by phone call to the office staff.   Patient agreed to services and verbal consent obtained.   Follow up plan:   Lynnae January Upstream Scheduler

## 2019-12-07 NOTE — Progress Notes (Signed)
Kidney tests now normal lp

## 2019-12-08 LAB — URINE CULTURE

## 2019-12-08 NOTE — Progress Notes (Signed)
Urine culture E. Coli sensitive to cipro lp

## 2019-12-20 ENCOUNTER — Encounter: Payer: Self-pay | Admitting: Legal Medicine

## 2019-12-20 ENCOUNTER — Ambulatory Visit (INDEPENDENT_AMBULATORY_CARE_PROVIDER_SITE_OTHER): Payer: PPO | Admitting: Legal Medicine

## 2019-12-20 ENCOUNTER — Other Ambulatory Visit: Payer: Self-pay | Admitting: Legal Medicine

## 2019-12-20 ENCOUNTER — Other Ambulatory Visit: Payer: Self-pay

## 2019-12-20 VITALS — BP 98/64 | HR 72 | Temp 97.7°F | Resp 16

## 2019-12-20 DIAGNOSIS — I9589 Other hypotension: Secondary | ICD-10-CM

## 2019-12-20 DIAGNOSIS — E861 Hypovolemia: Secondary | ICD-10-CM | POA: Diagnosis not present

## 2019-12-20 MED ORDER — FLUDROCORTISONE ACETATE 0.1 MG PO TABS: 0.1000 mg | ORAL_TABLET | Freq: Every day | ORAL | 2 refills | Status: DC

## 2019-12-20 NOTE — Chronic Care Management (AMB) (Signed)
Chronic Care Management Pharmacy  Name: [name]  MRN: [id number] DOB: [birth year]  Chief Complaint/ HPI  [name],  86 y.o. , female presents for their Initial CCM visit with the clinical pharmacist via telephone due to COVID-19 Pandemic.  PCP : Katherine Anes, MD  Their chronic conditions include: atrial fibrillation, HTN, CKD II, HLD, Adjustment disorder with depressed mood  Office Visits: 12/06/2019 - Acute cystitis - macrobid ordered. Changed acebutolol to QOD due to low bp. Added boost to improve nutrition.  11/29/2019 - No anemia, glucose 106, kidney tests lower. Headache for a few days and fatigue. Ordered CT scan to rule out bleed.  10/24/2019 - Ears cleaned out in office.  10/17/2019 - No medication changes. Stable on current regimen.    Consult Visit: 10/21/2019 - Dr. Bettina Singh - EKG ordered.The ekg ordered today demonstrates sinus rhythm first-degree heart block otherwise normal. Afib stable continue beta blocker and anticoagulant. Stable hyperlipidemia.   Medications: Outpatient Encounter Medications as of 12/21/2019  Medication Sig  . acebutolol (SECTRAL) 200 MG capsule Take 1 capsule (200 mg total) by mouth daily. (Patient taking differently: Take 200 mg by mouth every other day. )  . Apoaequorin (PREVAGEN PO) Take 1 tablet by mouth daily.  . Cholecalciferol (VITAMIN D3) 1000 units CAPS Take 1,000 Units by mouth daily.  . fludrocortisone (FLORINEF) 0.1 MG tablet Take 1 tablet (0.1 mg total) by mouth daily.  . mirtazapine (REMERON) 15 MG tablet Take 1 tablet (15 mg total) by mouth daily.  [name] NUTRITIONAL SUPPLEMENTS PO Take 1 tablet by mouth daily. Taking Advanced Hearing  . OVER THE COUNTER MEDICATION Take 1 tablet by mouth daily. Selenex GSH supplement  . pravastatin (PRAVACHOL) 40 MG tablet Take 1 tablet (40 mg total) by mouth daily.  . prednisoLONE acetate (PRED FORTE) 1 % ophthalmic suspension 1 drop 4 (four) times daily.  . vitamin B-12  (CYANOCOBALAMIN) 1000 MCG tablet Take 1,000 mcg by mouth daily.  Alveda Reasons 10 MG TABS tablet TAKE 1 TABLET BY MOUTH EVERY DAY  . Blood Pressure Monitoring (B-D ASSURE BPM/AUTO ARM CUFF) MISC 1 application by Does not apply route daily. i10 (Patient not taking: Reported on 12/21/2019)  . nitrofurantoin, macrocrystal-monohydrate, (MACROBID) 100 MG capsule Take 1 capsule (100 mg total) by mouth 2 (two) times daily. (Patient not taking: Reported on 12/21/2019)  . [DISCONTINUED] mirtazapine (REMERON) 15 MG tablet Take 15 mg by mouth daily.   No facility-administered encounter medications on file as of 12/21/2019.   Allergies  Allergen Reactions  . Codeine Nausea And Vomiting  . Penicillins Rash  . Sulfa Antibiotics Nausea And Vomiting    SDOH Screenings   Alcohol Screen:   . Last Alcohol Screening Score (AUDIT):   Depression (PHQ2-9): Low Risk   . PHQ-2 Score: 0  Financial Resource Strain: Low Risk   . Difficulty of Paying Living Expenses: Not hard at all  Food Insecurity: No Food Insecurity  . Worried About Charity fundraiser in the Last Year: Never true  . Ran Out of Food in the Last Year: Never true  Housing: Low Risk   . Last Housing Risk Score: 0  Physical Activity: Inactive  . Days of Exercise per Week: 0 days  . Minutes of Exercise per Session: 0 min  Social Connections: Somewhat Isolated  . Frequency of Communication with Friends and Family: More than three times a week  . Frequency of Social Gatherings with Friends and Family: More than three times a week  .  Attends Religious Services: More than 4 times per year  . Active Member of Clubs or Organizations: No  . Attends Archivist Meetings: Never  . Marital Status: Widowed  Stress: No Stress Concern Present  . Feeling of Stress : Not at all  Tobacco Use: Low Risk   . Smoking Tobacco Use: Never Smoker  . Smokeless Tobacco Use: Never Used  Transportation Needs: No Transportation Needs  . Lack of Transportation  (Medical): No  . Lack of Transportation (Non-Medical): No     Current Diagnosis/Assessment:  Goals Addressed            This Visit's Progress   . Pharmacy Care Plan       CARE PLAN ENTRY (see longitudinal plan of care for additional care plan information)  Current Barriers:  . Uncontrolled hyperlipidemia, complicated by age, htn . Current antihyperlipidemic regimen: Pravastatin . Previous antihyperlipidemic medications tried n/a . Most recent lipid panel:     Component Value Date/Time   CHOL 213 (H) 10/10/2019 0906   TRIG 109 10/10/2019 0906   HDL 58 10/10/2019 0906   CHOLHDL 3.7 10/10/2019 0906   CHOLHDL 4.3 11/09/2018 0512   VLDL 11 11/09/2018 0512   LDLCALC 136 (H) 10/10/2019 0906 .   [name] ASCVD risk enhancing conditions: age >33, DM, HTN, CKD, CHF, current smoker . 10-year ASCVD risk score: Hx of Stroke  Pharmacist Clinical Goal(s):  [name] Over the next 60 days, patient will work with PharmD and providers towards optimized antihyperlipidemic therapy  Interventions: . Comprehensive medication review performed; medication list updated in electronic medical record.  [name] care team collaboration (see longitudinal plan of care) . Ensure safety, efficacy, and affordability of medications  Patient Self Care Activities:  . Patient will focus on medication adherence by continuing to use daily medication planner.   Initial goal documentation     . Pharmacy Care Plan       CARE PLAN ENTRY (see longitudinal plan of care for additional care plan information)  Current Barriers:  . Hypertension/Afib, complicated by hypotension . Current antihypertensive regimen: acebutolol 200 mg qod . Last practice recorded BP readings:  BP Readings from Last 3 Encounters:  12/20/19 98/64  12/06/19 100/60  11/29/19 100/60 .   [name] Current home BP readings: Waiting on monitor to be approved . Most recent eGFR/CrCl: No results found for: EGFR  No components found for:  CRCL  Pharmacist Clinical Goal(s):  [name] Over the next 60 days, patient will work with PharmD and providers to optimize blood pressure readings.   Interventions: . Inter-disciplinary care team collaboration (see longitudinal plan of care) . Comprehensive medication review performed; medication list updated in the electronic medical record.  . Ensure safety, efficacy, and affordability of medications   Patient Self Care Activities:  . Patient will continue to check BP weekly once meter available , document, and provide at future appointments . Patient will focus on medication adherence by continuing to use daily medication planner.  Initial goal documentation        AFIB/HTN   Patient is currently rate controlled.  Patient has failed these meds in past: n/a Patient is currently controlled on the following medications: acebutolol 200 mg daily, xarelto 10 mg daily, fludrocortisone  Office blood pressures are  BP Readings from Last 3 Encounters:  12/06/19 100/60  11/29/19 100/60  10/21/19 108/62   Patient checks BP at home: working to get a bp monitor Patient home BP readings are ranging: has had low bp lately.  Getting a monitor to use at home.   We discussed:  diet and exercise extensively. Patient feels weak and low energy when bp is low. Patient is not a big water drinker. Counseled on the importance of hydration to maintain bp. Patient is aware that she needs to stay hydrated this summer working in her yard. Patient is cautious about yard work when bp is low and feels lightheaded.   Plan  Continue current medications    Hyperlipidemia   Lipid Panel     Component Value Date/Time   CHOL 213 (H) 10/10/2019 0906   TRIG 109 10/10/2019 0906   HDL 58 10/10/2019 0906   CHOLHDL 3.7 10/10/2019 0906   CHOLHDL 4.3 11/09/2018 0512   VLDL 11 11/09/2018 0512   LDLCALC 136 (H) 10/10/2019 0906   LABVLDL 19 10/10/2019 0906     The ASCVD Risk score (Goff DC Jr., et al., 2013)  failed to calculate for the following reasons:   The 2013 ASCVD risk score is only valid for ages 35 to 61   The patient has a prior MI or stroke diagnosis   Patient has failed these meds in past: n/a Patient is currently controlled on the following medications: pravastatin 40 mg daily  We discussed:  diet and exercise extensively. Patient is active working in [employer]. She eats 3 meals each day but very limited appetite. Has recently started drinking an ensure each afternoon.   Plan  Continue current medications     and  Adjustment Disorder with Depressed Mood   Patient has failed these meds in past: n/a Patient is currently controlled on the following medications: Mirtazapine 15 mg daily  We discussed:  Patient has taken this for years. Her Dr. that was managing this retired. Dr. Henrene Pastor will now take over prescribing this. She has been stable on it for years. Takes it at bedtime each day. She has worsened depressions/"spells" in the winter time. She is enjoying her flowers, weeding and maintaining the beds outdoors now. This improves her mood dramatically.   Plan  Continue current medications   Health Maintenance   Patient is currently controlled on the following medications:   Vitamin d 1000 daily- general health Vitamin b-12 1000 daily - general health Selenex GSH - general health Advanced Hearing Supplement - general health/hearing Prevagen - general health/memory  We discussed:  Patient's taking supplements that she has found for supporting healthy living.   Plan  Continue current medications  Vaccines   Reviewed and discussed patient's vaccination history.    Immunization History  Administered Date(s) Administered  . PFIZER SARS-COV-2 Vaccination 09/13/2019, 10/03/2019  . Pneumococcal Polysaccharide-23 08/30/2018    Plan  Recommended patient receive flu vaccine in office.   Medication Management   Pt uses [facility] for all  medications Patient fills and takes medication from a weekly pill box organizer.  Pt endorses good compliance  We discussed: Continuing to take medications as prescribed. Use caution when lightheaded with hypotension to avoid falls. Work to stay hydrated.   Plan  Continue current medications and supplements.   Follow up: 2 months

## 2019-12-20 NOTE — Addendum Note (Signed)
Addended by: Brent Bulla on: 12/20/2019 11:10 AM   Modules accepted: Level of Service

## 2019-12-20 NOTE — Progress Notes (Addendum)
Acute Office Visit  Subjective:    Patient ID: [name], female    DOB: 23-Mar-1926, 86 y.o.   MRN: [id number]  Chief Complaint  Patient presents with  . Fatigue    HPI Patient is in today for weakness and dizziness when standing.  Hr BP was 98 systolic but 110 standing.  She refused to go to ER for IV fluids.  She is being treated for UTI now.  Past Medical History:  Diagnosis Date  . Closed fracture of one rib of right side with routine healing 06/30/2017  . Current use of long term anticoagulation 07/21/2017  . Depression   . First degree atrioventricular block 06/26/2017  . Hammer toe 06/26/2017  . Hyperlipidemia 06/26/2017  . Other specified cardiac arrhythmias 06/26/2017  . Pain in limb 06/26/2017  . Palpitations 06/26/2017  . Paroxysmal atrial fibrillation (HCC) 06/26/2017   Overview:  remote history post operatively, CHADS 2 score=2 Overview:  Overview:  remote history post operatively, CHADS 2 score=2  . PAT (paroxysmal atrial tachycardia) (HCC) 06/14/2015  . Premature atrial complex 06/26/2017  . Premature ventricular contractions 06/26/2017  . PSVT (paroxysmal supraventricular tachycardia) (HCC) 11/10/2017  . Stroke (HCC)   . Traumatic pneumothorax 06/30/2017    Past Surgical History:  Procedure Laterality Date  . ABDOMINAL HYSTERECTOMY    . SHOULDER SURGERY    . TONSILLECTOMY      Family History  Problem Relation Age of Onset  . Bone cancer Mother   . Breast cancer Father   . Diabetes Father   . Heart disease Father   . Breast cancer Sister     Social History   Socioeconomic History  . Marital status: Widowed    Spouse name: Not on file  . Number of children: 2  . Years of education: Not on file  . Highest education level: Not on file  Occupational History  . Not on file  Tobacco Use  . Smoking status: Never Smoker  . Smokeless tobacco: Never Used  Substance and Sexual Activity  . Alcohol use: No  . Drug use: No  . Sexual activity:  Not Currently  Other Topics Concern  . Not on file  Social History Narrative   Mrs. Fike is widowed. Her daughter, [name], is her POA and lives 12 miles away. She resides in her own home, alone for last 7 years. Since her CVA she has had someone with her most of the time. She has a hired caregiver or a family member stays most of the day. She feel safe in her home and in her neighborhood.   Social Determinants of Health   Financial Resource Strain: Low Risk   . Difficulty of Paying Living Expenses: Not hard at all  Food Insecurity: No Food Insecurity  . Worried About Programme researcher, broadcasting/film/video in the Last Year: Never true  . Ran Out of Food in the Last Year: Never true  Transportation Needs: No Transportation Needs  . Lack of Transportation (Medical): No  . Lack of Transportation (Non-Medical): No  Physical Activity: Inactive  . Days of Exercise per Week: 0 days  . Minutes of Exercise per Session: 0 min  Stress: No Stress Concern Present  . Feeling of Stress : Not at all  Social Connections: Somewhat Isolated  . Frequency of Communication with Friends and Family: More than three times a week  . Frequency of Social Gatherings with Friends and Family: More than three times a week  . Attends Religious  Services: More than 4 times per year  . Active Member of Clubs or Organizations: No  . Attends Banker Meetings: Never  . Marital Status: Widowed  Intimate Partner Violence:   . Fear of Current or Ex-Partner:   . Emotionally Abused:   [name] Physically Abused:   . Sexually Abused:     Outpatient Medications Prior to Visit  Medication Sig Dispense Refill  . acebutolol (SECTRAL) 200 MG capsule Take 1 capsule (200 mg total) by mouth daily. 90 capsule 4  . Blood Pressure Monitoring (B-D ASSURE BPM/AUTO ARM CUFF) MISC 1 application by Does not apply route daily. i10 1 each 0  . Cholecalciferol (VITAMIN D3) 1000 units CAPS Take 1,000 Units by mouth daily.    . mirtazapine  (REMERON) 15 MG tablet Take 15 mg by mouth daily.    . nitrofurantoin, macrocrystal-monohydrate, (MACROBID) 100 MG capsule Take 1 capsule (100 mg total) by mouth 2 (two) times daily. 14 capsule 0  . pravastatin (PRAVACHOL) 40 MG tablet Take 1 tablet (40 mg total) by mouth daily. 90 tablet 1  . prednisoLONE acetate (PRED FORTE) 1 % ophthalmic suspension 1 drop 4 (four) times daily.    . vitamin B-12 (CYANOCOBALAMIN) 1000 MCG tablet Take 1,000 mcg by mouth daily.    [name] 10 MG TABS tablet TAKE 1 TABLET BY MOUTH EVERY DAY 30 tablet 6   No facility-administered medications prior to visit.    Allergies  Allergen Reactions  . Codeine Nausea And Vomiting  . Penicillins Rash  . Sulfa Antibiotics Nausea And Vomiting    Review of Systems  Constitutional: Positive for fatigue.  HENT: Negative.   Eyes: Negative.   Cardiovascular: Negative.   Genitourinary: Negative.   Neurological: Negative.        Objective:    Physical Exam Vitals reviewed.  Constitutional:      Appearance: Normal appearance.  Cardiovascular:     Rate and Rhythm: Normal rate and regular rhythm.     Pulses: Normal pulses.     Heart sounds: Normal heart sounds.  Pulmonary:     Effort: Pulmonary effort is normal.     Breath sounds: Normal breath sounds.  Neurological:     General: No focal deficit present.     Mental Status: She is alert.     BP 98/64 (BP Location: Left Arm, Patient Position: Sitting, Cuff Size: Normal)   Pulse 72   Temp 97.7 F (36.5 C)   Resp 16   LMP  (LMP Unknown)   SpO2 98%  Wt Readings from Last 3 Encounters:  12/06/19 107 lb (48.5 kg)  11/29/19 107 lb 6.4 oz (48.7 kg)  10/21/19 107 lb (48.5 kg)    Health Maintenance Due  Topic Date Due  . TETANUS/TDAP  Never done  . DEXA SCAN  Never done  . PNA vac Low Risk Adult (2 of 2 - PCV13) 08/31/2019    There are no preventive care reminders to display for this patient.   No results found for: TSH Lab Results  Component  Value Date   WBC 5.8 11/29/2019   HGB 12.4 11/29/2019   HCT 36.5 11/29/2019   MCV 102 (H) 11/29/2019   PLT 250 11/29/2019   Lab Results  Component Value Date   NA 141 12/06/2019   K 4.8 12/06/2019   CO2 23 12/06/2019   GLUCOSE 84 12/06/2019   BUN 18 12/06/2019   CREATININE 0.85 12/06/2019   BILITOT 0.4 12/06/2019   ALKPHOS 52 12/06/2019  AST 23 12/06/2019   ALT 11 12/06/2019   PROT 6.8 12/06/2019   ALBUMIN 4.3 12/06/2019   CALCIUM 9.6 12/06/2019   ANIONGAP 6 11/09/2018   Lab Results  Component Value Date   CHOL 213 (H) 10/10/2019   Lab Results  Component Value Date   HDL 58 10/10/2019   Lab Results  Component Value Date   LDLCALC 136 (H) 10/10/2019   Lab Results  Component Value Date   TRIG 109 10/10/2019   Lab Results  Component Value Date   CHOLHDL 3.7 10/10/2019   Lab Results  Component Value Date   HGBA1C 5.3 11/09/2018       Assessment & Plan:   Problem List Items Addressed This Visit      Cardiovascular and Mediastinum   Hypotension - Primary    Patient seen for low BP and instablity.  I feel she is dehydrated but patient refused to go to ER or outpatient for IV fluids.  We discussed the use of florinef to increase salt retention and therefore fluid retention.  Try on 0.47mcg a day for 2 weeks, follow up for BP and electrolytes        Return in about 2 weeks (around 01/03/2020).   No orders of the defined types were placed in this encounter.    Reinaldo Meeker, MDPatient came in today for blood pressure check.

## 2019-12-20 NOTE — Assessment & Plan Note (Addendum)
Patient seen for low BP and instablity.  I feel she is dehydrated but patient refused to go to ER or outpatient for IV fluids.  We discussed the use of florinef to increase salt retention and therefore fluid retention.  Try on 0.24mcg a day for 2 weeks, follow up for BP and electrolytes

## 2019-12-21 ENCOUNTER — Ambulatory Visit: Payer: PPO

## 2019-12-21 ENCOUNTER — Other Ambulatory Visit: Payer: Self-pay

## 2019-12-21 DIAGNOSIS — I10 Essential (primary) hypertension: Secondary | ICD-10-CM

## 2019-12-21 DIAGNOSIS — E7800 Pure hypercholesterolemia, unspecified: Secondary | ICD-10-CM

## 2019-12-21 MED ORDER — MIRTAZAPINE 15 MG PO TABS: 15.0000 mg | ORAL_TABLET | Freq: Every day | ORAL | 2 refills | Status: DC

## 2019-12-21 NOTE — Patient Instructions (Signed)
Visit Information  Thank you for your time discussing your medications. I look forward to working with you to achieve your health care goals. Below is a summary of what we talked about during our visit.   Goals Addressed            This Visit's Progress   . Pharmacy Care Plan       CARE PLAN ENTRY (see longitudinal plan of care for additional care plan information)  Current Barriers:  . Uncontrolled hyperlipidemia, complicated by age, htn . Current antihyperlipidemic regimen: Pravastatin . Previous antihyperlipidemic medications tried n/a . Most recent lipid panel:     Component Value Date/Time   CHOL 213 (H) 10/10/2019 0906   TRIG 109 10/10/2019 0906   HDL 58 10/10/2019 0906   CHOLHDL 3.7 10/10/2019 0906   CHOLHDL 4.3 11/09/2018 0512   VLDL 11 11/09/2018 0512   LDLCALC 136 (H) 10/10/2019 0906 .   [name] ASCVD risk enhancing conditions: age >56, DM, HTN, CKD, CHF, current smoker . 10-year ASCVD risk score: Hx of Stroke  Pharmacist Clinical Goal(s):  [name] Over the next 60 days, patient will work with PharmD and providers towards optimized antihyperlipidemic therapy  Interventions: . Comprehensive medication review performed; medication list updated in electronic medical record.  [name] care team collaboration (see longitudinal plan of care) . Ensure safety, efficacy, and affordability of medications  Patient Self Care Activities:  . Patient will focus on medication adherence by continuing to use daily medication planner.   Initial goal documentation     . Pharmacy Care Plan       CARE PLAN ENTRY (see longitudinal plan of care for additional care plan information)  Current Barriers:  . Hypertension/Afib, complicated by hypotension . Current antihypertensive regimen: acebutolol 200 mg qod . Last practice recorded BP readings:  BP Readings from Last 3 Encounters:  12/20/19 98/64  12/06/19 100/60  11/29/19 100/60 .   [name] Current home BP readings: Waiting on  monitor to be approved . Most recent eGFR/CrCl: No results found for: EGFR  No components found for: CRCL  Pharmacist Clinical Goal(s):  [name] Over the next 60 days, patient will work with PharmD and providers to optimize blood pressure readings.   Interventions: . Inter-disciplinary care team collaboration (see longitudinal plan of care) . Comprehensive medication review performed; medication list updated in the electronic medical record.  . Ensure safety, efficacy, and affordability of medications   Patient Self Care Activities:  . Patient will continue to check BP weekly once meter available , document, and provide at future appointments . Patient will focus on medication adherence by continuing to use daily medication planner.  Initial goal documentation        [name] was given information about Chronic Care Management services today including:  1. CCM service includes personalized support from designated clinical staff supervised by her physician, including individualized plan of care and coordination with other care providers 2. 24/7 contact phone numbers for assistance for urgent and routine care needs. 3. Standard insurance, coinsurance, copays and deductibles apply for chronic care management only during months in which we provide at least 20 minutes of these services. Most insurances cover these services at 100%, however patients may be responsible for any copay, coinsurance and/or deductible if applicable. This service may help you avoid the need for more expensive face-to-face services. 4. Only one practitioner may furnish and bill the service in a calendar month. 5. The patient may stop CCM services at any time (effective  at the end of the month) by phone call to the office staff.  Patient agreed to services and verbal consent obtained.   Print copy of patient instructions provided.  Telephone follow up appointment with pharmacy team member scheduled for: 02/20/2020 with  Hattie Perch, PharmD Clinical Pharmacist [facility] [phone]  Hypotension As your heart beats, it forces blood through your body. This force is called blood pressure. If you have hypotension, you have low blood pressure. When your blood pressure is too low, you may not get enough blood to your brain or other parts of your body. This may cause you to feel weak, light-headed, have a fast heartbeat, or even pass out (faint). Low blood pressure may be harmless, or it may cause serious problems. What are the causes?  Blood loss.  Not enough water in the body (dehydration).  Heart problems.  Hormone problems.  Pregnancy.  A very bad infection.  Not having enough of certain nutrients.  Very bad allergic reactions.  Certain medicines. What increases the risk?  Age. The risk increases as you get older.  Conditions that affect the heart or the brain and spinal cord (central nervous system).  Taking certain medicines.  Being pregnant. What are the signs or symptoms?  Feeling: ? Weak. ? Light-headed. ? Dizzy. ? Tired (fatigued).  Blurred vision.  Fast heartbeat.  Passing out, in very bad cases. How is this treated?  Changing your diet. This may involve eating more salt (sodium) or drinking more water.  Taking medicines to raise your blood pressure.  Changing how much you take (the dosage) of some of your medicines.  Wearing compression stockings. These stockings help to prevent blood clots and reduce swelling in your legs. In some cases, you may need to go to the hospital for:  Fluid replacement. This means you will receive fluids through an IV tube.  Blood replacement. This means you will receive donated blood through an IV tube (transfusion).  Treating an infection or heart problems, if this applies.  Monitoring. You may need to be monitored while medicines that you are taking wear off. Follow these instructions at home: Eating and  drinking   Drink enough fluids to keep your pee (urine) pale yellow.  Eat a healthy diet. Follow instructions from your doctor about what you can eat or drink. A healthy diet includes: ? Fresh fruits and vegetables. ? Whole grains. ? Low-fat (lean) meats. ? Low-fat dairy products.  Eat extra salt only as told. Do not add extra salt to your diet unless your doctor tells you to.  Eat small meals often.  Avoid standing up quickly after you eat. Medicines  Take over-the-counter and prescription medicines only as told by your doctor. ? Follow instructions from your doctor about changing how much you take of your medicines, if this applies. ? Do not stop or change any of your medicines on your own. General instructions   Wear compression stockings as told by your doctor.  Get up slowly from lying down or sitting.  Avoid hot showers and a lot of heat as told by your doctor.  Return to your normal activities as told by your doctor. Ask what activities are safe for you.  Do not use any products that contain nicotine or tobacco, such as cigarettes, e-cigarettes, and chewing tobacco. If you need help quitting, ask your doctor.  Keep all follow-up visits as told by your doctor. This is important. Contact a doctor if:  You throw  up (vomit).  You have watery poop (diarrhea).  You have a fever for more than 2-3 days.  You feel more thirsty than normal.  You feel weak and tired. Get help right away if:  You have chest pain.  You have a fast or uneven heartbeat.  You lose feeling (have numbness) in any part of your body.  You cannot move your arms or your legs.  You have trouble talking.  You get sweaty or feel light-headed.  You pass out.  You have trouble breathing.  You have trouble staying awake.  You feel mixed up (confused). Summary  Hypotension is also called low blood pressure. It is when the force of blood pumping through your arteries is too  weak.  Hypotension may be harmless, or it may cause serious problems.  Treatment may include changing your diet and medicines, and wearing compression stockings.  In very bad cases, you may need to go to the hospital. This information is not intended to replace advice given to you by your health care provider. Make sure you discuss any questions you have with your health care provider. Document Revised: 02/11/2018 Document Reviewed: 02/11/2018 Elsevier Patient Education  [place].

## 2019-12-22 ENCOUNTER — Other Ambulatory Visit: Payer: Self-pay

## 2019-12-22 DIAGNOSIS — E7800 Pure hypercholesterolemia, unspecified: Secondary | ICD-10-CM

## 2019-12-22 DIAGNOSIS — I10 Essential (primary) hypertension: Secondary | ICD-10-CM

## 2019-12-22 NOTE — Progress Notes (Signed)
Patient had an appointment on 12/21/2019 with Sara Beth Brown, PharmD 

## 2020-01-05 ENCOUNTER — Ambulatory Visit: Payer: PPO

## 2020-01-05 ENCOUNTER — Other Ambulatory Visit: Payer: Self-pay

## 2020-01-05 VITALS — BP 116/68 | HR 84 | Temp 97.7°F | Resp 16

## 2020-01-05 DIAGNOSIS — H9193 Unspecified hearing loss, bilateral: Secondary | ICD-10-CM | POA: Diagnosis not present

## 2020-01-05 DIAGNOSIS — H6641 Suppurative otitis media, unspecified, right ear: Secondary | ICD-10-CM | POA: Diagnosis not present

## 2020-01-05 DIAGNOSIS — H9211 Otorrhea, right ear: Secondary | ICD-10-CM | POA: Diagnosis not present

## 2020-01-05 DIAGNOSIS — I10 Essential (primary) hypertension: Secondary | ICD-10-CM

## 2020-01-05 DIAGNOSIS — H6123 Impacted cerumen, bilateral: Secondary | ICD-10-CM | POA: Diagnosis not present

## 2020-01-05 NOTE — Progress Notes (Signed)
Patient came in for blood pressure check.

## 2020-01-12 ENCOUNTER — Encounter: Payer: Self-pay | Admitting: Legal Medicine

## 2020-01-12 ENCOUNTER — Ambulatory Visit (INDEPENDENT_AMBULATORY_CARE_PROVIDER_SITE_OTHER): Payer: PPO | Admitting: Legal Medicine

## 2020-01-12 ENCOUNTER — Other Ambulatory Visit: Payer: Self-pay

## 2020-01-12 VITALS — BP 134/70 | HR 76 | Temp 97.2°F | Resp 17 | Ht 63.0 in | Wt 110.0 lb

## 2020-01-12 DIAGNOSIS — I10 Essential (primary) hypertension: Secondary | ICD-10-CM

## 2020-01-12 NOTE — Progress Notes (Signed)
Established Patient Office Visit  Subjective:  Patient ID: [name], female    DOB: June 27, 1926  Age: 86 y.o. MRN: [id number]  CC:  Chief Complaint  Patient presents with  . Hypertension    Since Monday BP was 165/102    HPI [name] presents for hypertension.  She was started on florinef and now BP is too high.  She is retaining too much salt and water.  We will discontinue medicine. No chest pain or orthostasis.  Past Medical History:  Diagnosis Date  . Closed fracture of one rib of right side with routine healing 06/30/2017  . Current use of long term anticoagulation 07/21/2017  . Depression   . First degree atrioventricular block 06/26/2017  . Hammer toe 06/26/2017  . Hyperlipidemia 06/26/2017  . Other specified cardiac arrhythmias 06/26/2017  . Pain in limb 06/26/2017  . Palpitations 06/26/2017  . Paroxysmal atrial fibrillation (HCC) 06/26/2017   Overview:  remote history post operatively, CHADS 2 score=2 Overview:  Overview:  remote history post operatively, CHADS 2 score=2  . PAT (paroxysmal atrial tachycardia) (HCC) 06/14/2015  . Premature atrial complex 06/26/2017  . Premature ventricular contractions 06/26/2017  . PSVT (paroxysmal supraventricular tachycardia) (HCC) 11/10/2017  . Stroke (HCC)   . Traumatic pneumothorax 06/30/2017    Past Surgical History:  Procedure Laterality Date  . ABDOMINAL HYSTERECTOMY    . SHOULDER SURGERY    . TONSILLECTOMY      Family History  Problem Relation Age of Onset  . Bone cancer Mother   . Breast cancer Father   . Diabetes Father   . Heart disease Father   . Breast cancer Sister     Social History   Socioeconomic History  . Marital status: Widowed    Spouse name: Not on file  . Number of children: 2  . Years of education: Not on file  . Highest education level: Not on file  Occupational History  . Not on file  Tobacco Use  . Smoking status: Never Smoker  . Smokeless tobacco: Never Used    Substance and Sexual Activity  . Alcohol use: No  . Drug use: No  . Sexual activity: Not Currently  Other Topics Concern  . Not on file  Social History Narrative   [name] is widowed. Her daughter, [name], is her POA and lives 12 miles away. She resides in her own home, alone for last 7 years. Since her CVA she has had someone with her most of the time. She has a hired caregiver or a family member stays most of the day. She feel safe in her home and in her neighborhood.   Social Determinants of Health   Financial Resource Strain:   . Difficulty of Paying Living Expenses:   Food Insecurity:   . Worried About Programme researcher, broadcasting/film/video in the Last Year:   . Barista in the Last Year:   Transportation Needs:   . Freight forwarder (Medical):   [name] Lack of Transportation (Non-Medical):   Physical Activity:   . Days of Exercise per Week:   . Minutes of Exercise per Session:   Stress:   . Feeling of Stress :   Social Connections:   . Frequency of Communication with Friends and Family:   . Frequency of Social Gatherings with Friends and Family:   . Attends Religious Services:   . Active Member of Clubs or Organizations:   . Attends Banker Meetings:   .  Marital Status:   Intimate Partner Violence:   . Fear of Current or Ex-Partner:   . Emotionally Abused:   [name] Physically Abused:   . Sexually Abused:     Outpatient Medications Prior to Visit  Medication Sig Dispense Refill  . acebutolol (SECTRAL) 200 MG capsule Take 1 capsule (200 mg total) by mouth daily. (Patient taking differently: Take 200 mg by mouth every other day. ) 90 capsule 4  . Apoaequorin (PREVAGEN PO) Take 1 tablet by mouth daily.    . Blood Pressure Monitoring (B-D ASSURE BPM/AUTO ARM CUFF) MISC 1 application by Does not apply route daily. i10 1 each 0  . Cholecalciferol (VITAMIN D3) 1000 units CAPS Take 1,000 Units by mouth daily.    . ciprofloxacin (CILOXAN) 0.3 % ophthalmic solution 4  drops 2 (two) times daily.    . clarithromycin (BIAXIN) 500 MG tablet Take 500 mg by mouth 2 (two) times daily.    [name] dexamethasone (DECADRON) 0.1 % ophthalmic solution SMARTSIG:4 Drop(s) Right Ear Twice Daily    . mirtazapine (REMERON) 15 MG tablet Take 1 tablet (15 mg total) by mouth daily. 30 tablet 2  . nitrofurantoin, macrocrystal-monohydrate, (MACROBID) 100 MG capsule Take 1 capsule (100 mg total) by mouth 2 (two) times daily. 14 capsule 0  . NUTRITIONAL SUPPLEMENTS PO Take 1 tablet by mouth daily. Taking Advanced [facility]    . OVER THE COUNTER MEDICATION Take 1 tablet by mouth daily. Selenex GSH supplement    . pravastatin (PRAVACHOL) 40 MG tablet Take 1 tablet (40 mg total) by mouth daily. 90 tablet 1  . prednisoLONE acetate (PRED FORTE) 1 % ophthalmic suspension 1 drop 4 (four) times daily.    . vitamin B-12 (CYANOCOBALAMIN) 1000 MCG tablet Take 1,000 mcg by mouth daily.    [name] 10 MG TABS tablet TAKE 1 TABLET BY MOUTH EVERY DAY 30 tablet 6  . fludrocortisone (FLORINEF) 0.1 MG tablet Take 1 tablet (0.1 mg total) by mouth daily. 30 tablet 2   No facility-administered medications prior to visit.    Allergies  Allergen Reactions  . Codeine Nausea And Vomiting  . Penicillins Rash  . Sulfa Antibiotics Nausea And Vomiting    ROS Review of Systems  Constitutional: Negative.   HENT: Negative.   Eyes: Negative.   Respiratory: Negative.   Cardiovascular: Negative.   Gastrointestinal: Negative.   Endocrine: Negative.   Genitourinary: Negative.   Musculoskeletal: Negative.   Skin: Negative.   Neurological: Negative.   Psychiatric/Behavioral: Negative.       Objective:    Physical Exam  Constitutional: She appears well-developed and well-nourished.  HENT:  Head: Normocephalic and atraumatic.  Right Ear: External ear normal.  Left Ear: External ear normal.  Nose: Nose normal.  Mouth/Throat: Oropharynx is clear and moist.  Eyes: Pupils are equal, round, and reactive to  light. Conjunctivae and EOM are normal.  Cardiovascular: Normal rate, regular rhythm, normal heart sounds and intact distal pulses.  Pulmonary/Chest: Effort normal and breath sounds normal.  Vitals reviewed.   BP 134/70 (BP Location: Right Arm, Patient Position: Sitting)   Pulse 76   Temp (!) 97.2 F (36.2 C) (Temporal)   Resp 17   Ht 5\' 3"  (1.6 m)   Wt 110 lb (49.9 kg)   LMP  (LMP Unknown)   BMI 19.49 kg/m  Wt Readings from Last 3 Encounters:  01/12/20 110 lb (49.9 kg)  12/06/19 107 lb (48.5 kg)  11/29/19 107 lb 6.4 oz (48.7 kg)  Health Maintenance Due  Topic Date Due  . TETANUS/TDAP  Never done  . DEXA SCAN  Never done  . PNA vac Low Risk Adult (2 of 2 - PCV13) 08/31/2019    There are no preventive care reminders to display for this patient.  No results found for: TSH Lab Results  Component Value Date   WBC 5.8 11/29/2019   HGB 12.4 11/29/2019   HCT 36.5 11/29/2019   MCV 102 (H) 11/29/2019   PLT 250 11/29/2019   Lab Results  Component Value Date   NA 141 12/06/2019   K 4.8 12/06/2019   CO2 23 12/06/2019   GLUCOSE 84 12/06/2019   BUN 18 12/06/2019   CREATININE 0.85 12/06/2019   BILITOT 0.4 12/06/2019   ALKPHOS 52 12/06/2019   AST 23 12/06/2019   ALT 11 12/06/2019   PROT 6.8 12/06/2019   ALBUMIN 4.3 12/06/2019   CALCIUM 9.6 12/06/2019   ANIONGAP 6 11/09/2018   Lab Results  Component Value Date   CHOL 213 (H) 10/10/2019   Lab Results  Component Value Date   HDL 58 10/10/2019   Lab Results  Component Value Date   LDLCALC 136 (H) 10/10/2019   Lab Results  Component Value Date   TRIG 109 10/10/2019   Lab Results  Component Value Date   CHOLHDL 3.7 10/10/2019   Lab Results  Component Value Date   HGBA1C 5.3 11/09/2018      Assessment & Plan:   Problem List Items Addressed This Visit      Cardiovascular and Mediastinum   Benign hypertension    BP is now going up with fludrocortisone.  We will stop but continue acebutolol qod.   This was emphasized  With patient and daughter. Get CMP to follow electrolytes.       Other Visit Diagnoses    Benign essential hypertension    -  Primary   Relevant Orders   Comprehensive metabolic panel      No orders of the defined types were placed in this encounter.   Follow-up: Return for as scheduled.    Reinaldo Meeker, MD

## 2020-01-12 NOTE — Assessment & Plan Note (Signed)
BP is now going up with fludrocortisone.  We will stop but continue acebutolol qod.  This was emphasized  With patient and daughter. Get CMP to follow electrolytes.

## 2020-01-13 LAB — COMPREHENSIVE METABOLIC PANEL
ALT: 12 IU/L (ref 0–32)
AST: 19 IU/L (ref 0–40)
Albumin/Globulin Ratio: 1.9 (ref 1.2–2.2)
Albumin: 4 g/dL (ref 3.5–4.6)
Alkaline Phosphatase: 54 IU/L (ref 39–117)
BUN/Creatinine Ratio: 21 (ref 12–28)
BUN: 14 mg/dL (ref 10–36)
Bilirubin Total: 0.3 mg/dL (ref 0.0–1.2)
CO2: 24 mmol/L (ref 20–29)
Calcium: 8.8 mg/dL (ref 8.7–10.3)
Chloride: 109 mmol/L — ABNORMAL HIGH (ref 96–106)
Creatinine, Ser: 0.67 mg/dL (ref 0.57–1.00)
GFR calc Af Amer: 89 mL/min/{1.73_m2} (ref >59)
GFR calc non Af Amer: 77 mL/min/{1.73_m2} (ref >59)
Globulin, Total: 2.1 g/dL (ref 1.5–4.5)
Glucose: 92 mg/dL (ref 65–99)
Potassium: 3.9 mmol/L (ref 3.5–5.2)
Sodium: 145 mmol/L — ABNORMAL HIGH (ref 134–144)
Total Protein: 6.1 g/dL (ref 6.0–8.5)

## 2020-01-13 NOTE — Progress Notes (Signed)
Labs normal, potassium ok lp

## 2020-01-15 DIAGNOSIS — F329 Major depressive disorder, single episode, unspecified: Secondary | ICD-10-CM

## 2020-01-15 DIAGNOSIS — R410 Disorientation, unspecified: Secondary | ICD-10-CM | POA: Diagnosis not present

## 2020-01-15 DIAGNOSIS — R778 Other specified abnormalities of plasma proteins: Secondary | ICD-10-CM | POA: Diagnosis not present

## 2020-01-15 DIAGNOSIS — I4891 Unspecified atrial fibrillation: Secondary | ICD-10-CM

## 2020-01-15 DIAGNOSIS — E785 Hyperlipidemia, unspecified: Secondary | ICD-10-CM

## 2020-01-15 DIAGNOSIS — R4182 Altered mental status, unspecified: Secondary | ICD-10-CM

## 2020-01-15 DIAGNOSIS — I10 Essential (primary) hypertension: Secondary | ICD-10-CM | POA: Diagnosis not present

## 2020-01-15 DIAGNOSIS — R413 Other amnesia: Secondary | ICD-10-CM | POA: Diagnosis not present

## 2020-01-15 DIAGNOSIS — R531 Weakness: Secondary | ICD-10-CM | POA: Diagnosis not present

## 2020-01-16 ENCOUNTER — Telehealth: Payer: Self-pay

## 2020-01-16 DIAGNOSIS — I361 Nonrheumatic tricuspid (valve) insufficiency: Secondary | ICD-10-CM | POA: Diagnosis not present

## 2020-01-16 DIAGNOSIS — G459 Transient cerebral ischemic attack, unspecified: Secondary | ICD-10-CM | POA: Diagnosis not present

## 2020-01-16 DIAGNOSIS — R4182 Altered mental status, unspecified: Secondary | ICD-10-CM | POA: Diagnosis not present

## 2020-01-16 DIAGNOSIS — Z8673 Personal history of transient ischemic attack (TIA), and cerebral infarction without residual deficits: Secondary | ICD-10-CM | POA: Diagnosis not present

## 2020-01-16 DIAGNOSIS — Z86711 Personal history of pulmonary embolism: Secondary | ICD-10-CM | POA: Diagnosis not present

## 2020-01-16 DIAGNOSIS — I340 Nonrheumatic mitral (valve) insufficiency: Secondary | ICD-10-CM | POA: Diagnosis not present

## 2020-01-16 DIAGNOSIS — R413 Other amnesia: Secondary | ICD-10-CM | POA: Diagnosis not present

## 2020-01-16 DIAGNOSIS — R778 Other specified abnormalities of plasma proteins: Secondary | ICD-10-CM | POA: Diagnosis not present

## 2020-01-16 DIAGNOSIS — G9341 Metabolic encephalopathy: Secondary | ICD-10-CM | POA: Diagnosis not present

## 2020-01-16 DIAGNOSIS — G4700 Insomnia, unspecified: Secondary | ICD-10-CM | POA: Diagnosis not present

## 2020-01-16 DIAGNOSIS — I63233 Cerebral infarction due to unspecified occlusion or stenosis of bilateral carotid arteries: Secondary | ICD-10-CM | POA: Diagnosis not present

## 2020-01-16 DIAGNOSIS — E785 Hyperlipidemia, unspecified: Secondary | ICD-10-CM | POA: Diagnosis not present

## 2020-01-16 DIAGNOSIS — R531 Weakness: Secondary | ICD-10-CM | POA: Diagnosis not present

## 2020-01-16 DIAGNOSIS — R748 Abnormal levels of other serum enzymes: Secondary | ICD-10-CM | POA: Diagnosis not present

## 2020-01-16 DIAGNOSIS — Z66 Do not resuscitate: Secondary | ICD-10-CM | POA: Diagnosis not present

## 2020-01-16 DIAGNOSIS — I4891 Unspecified atrial fibrillation: Secondary | ICD-10-CM | POA: Diagnosis not present

## 2020-01-16 DIAGNOSIS — F329 Major depressive disorder, single episode, unspecified: Secondary | ICD-10-CM | POA: Diagnosis not present

## 2020-01-16 DIAGNOSIS — Z79899 Other long term (current) drug therapy: Secondary | ICD-10-CM | POA: Diagnosis not present

## 2020-01-16 DIAGNOSIS — Z7901 Long term (current) use of anticoagulants: Secondary | ICD-10-CM | POA: Diagnosis not present

## 2020-01-16 NOTE — Telephone Encounter (Signed)
Patient was taking to ED because she was confused and her blood pressure was uncontrolled.

## 2020-01-18 ENCOUNTER — Other Ambulatory Visit: Payer: Self-pay | Admitting: Legal Medicine

## 2020-01-18 ENCOUNTER — Encounter: Payer: Self-pay | Admitting: Legal Medicine

## 2020-01-18 ENCOUNTER — Telehealth: Payer: Self-pay

## 2020-01-18 DIAGNOSIS — F01518 Vascular dementia, unspecified severity, with other behavioral disturbance: Secondary | ICD-10-CM | POA: Insufficient documentation

## 2020-01-18 DIAGNOSIS — F0151 Vascular dementia with behavioral disturbance: Secondary | ICD-10-CM

## 2020-01-18 HISTORY — DX: Vascular dementia with behavioral disturbance: F01.51

## 2020-01-18 NOTE — Telephone Encounter (Signed)
  Transition Care Management Follow-up Telephone Call  ROSSY VIRAG [id number]  Admit Date: 01/16/20 Discharge Date: 01/17/20 Diagnoses: Acute Metabolic Encephalopathy/confusion   2 day post discharge: 01/19/20 7 day post discharge: 01/24/20 14 day post discharge: 01/31/20  FAIZA BANSAL was discharged from [facility] on 01/17/20 with the diagnoses listed above.  She was contacted today via telephone in regards to transition of care.  I was unable to reach Ogema or her daughter [name].  I left a message for them to return my call.  Patient came into ED with complaints of transient alteration of memory and mental status worsening over the past two weeks.  MRI negative for stroke, no evidence of infection, CXR Negative, old infarction along the posterior left frontal lobe appears stable compared to prior.  Patient improved without intervention.  EF 50-55%  Hospital discharge summary states that patient is DNR, please verify.  Discharge Instructions:  -Needs follow-up CBC/CMP -follow elevated Troponin I 0.15 on 01/16/20 -Referral made to neurology -Started on Lipitor 40 mg QD -Discontinued Pravachol 40 mg & Fludrocortisone Acetate 0.1 mg QD -Discharged with Home Health  Items Reviewed:  Medications reviewed: yes  Allergies reviewed: yes  Dietary changes reviewed: yes  Referrals reviewed: yes   Provider Appointment booked with Dr Johny Shears, LPN 08/81/10 3:15 AM

## 2020-01-23 DIAGNOSIS — E785 Hyperlipidemia, unspecified: Secondary | ICD-10-CM | POA: Diagnosis not present

## 2020-01-23 DIAGNOSIS — F329 Major depressive disorder, single episode, unspecified: Secondary | ICD-10-CM | POA: Diagnosis not present

## 2020-01-23 DIAGNOSIS — Z8669 Personal history of other diseases of the nervous system and sense organs: Secondary | ICD-10-CM | POA: Diagnosis not present

## 2020-01-23 DIAGNOSIS — I4891 Unspecified atrial fibrillation: Secondary | ICD-10-CM | POA: Diagnosis not present

## 2020-01-23 DIAGNOSIS — Z789 Other specified health status: Secondary | ICD-10-CM | POA: Diagnosis not present

## 2020-01-23 DIAGNOSIS — G4700 Insomnia, unspecified: Secondary | ICD-10-CM | POA: Diagnosis not present

## 2020-01-23 DIAGNOSIS — H903 Sensorineural hearing loss, bilateral: Secondary | ICD-10-CM | POA: Diagnosis not present

## 2020-01-23 DIAGNOSIS — M1990 Unspecified osteoarthritis, unspecified site: Secondary | ICD-10-CM | POA: Diagnosis not present

## 2020-01-23 DIAGNOSIS — J449 Chronic obstructive pulmonary disease, unspecified: Secondary | ICD-10-CM | POA: Diagnosis not present

## 2020-01-23 DIAGNOSIS — Z7901 Long term (current) use of anticoagulants: Secondary | ICD-10-CM | POA: Diagnosis not present

## 2020-01-24 ENCOUNTER — Other Ambulatory Visit: Payer: Self-pay

## 2020-01-24 ENCOUNTER — Encounter: Payer: Self-pay | Admitting: Legal Medicine

## 2020-01-24 ENCOUNTER — Ambulatory Visit (INDEPENDENT_AMBULATORY_CARE_PROVIDER_SITE_OTHER): Payer: PPO | Admitting: Legal Medicine

## 2020-01-24 VITALS — BP 90/52 | HR 94 | Temp 97.3°F | Resp 16 | Ht 63.0 in | Wt 105.0 lb

## 2020-01-24 DIAGNOSIS — R072 Precordial pain: Secondary | ICD-10-CM | POA: Diagnosis not present

## 2020-01-24 DIAGNOSIS — I69313 Psychomotor deficit following cerebral infarction: Secondary | ICD-10-CM | POA: Diagnosis not present

## 2020-01-24 DIAGNOSIS — I10 Essential (primary) hypertension: Secondary | ICD-10-CM

## 2020-01-24 NOTE — Assessment & Plan Note (Signed)
An individual hypertension care plan was established and reinforced today.  The patient's status was assessed using clinical findings on exam and labs or diagnostic tests. The patient's success at meeting treatment goals on disease specific evidence-based guidelines and found to be fair controlled. SELF MANAGEMENT: The patient and I together assessed ways to personally work towards obtaining the recommended goals. RECOMMENDATIONS: avoid decongestants found in common cold remedies, decrease consumption of alcohol, perform routine monitoring of BP with home BP cuff, exercise, reduction of dietary salt, take medicines as prescribed, try not to miss doses and quit smoking.  Regular exercise and maintaining a healthy weight is needed.  Stress reduction may help. A CLINICAL SUMMARY including written plan identify barriers to care unique to individual due to social or financial issues.  We attempt to mutually creat solutions for individual and family understanding. 

## 2020-01-24 NOTE — Progress Notes (Signed)
Established Patient Office Visit  Subjective:  Patient ID: [name], female    DOB: February 23, 1926  Age: 86 y.o. MRN: [id number]  CC:  Chief Complaint  Patient presents with  . Acute Metabolic Encepalopathy/confused  Transition of care and reconciliation of medicines.  HPI [name] presents for transition of care.  She was admitted to [facility] on 5.17.2021 for metabolic encephalopathy and workup was negative.  She probably had mini stroke and then improved.  She was discharged on 01/17/2020 and is at home.  Hospice was called but family declined.  She is getting home PT for gait training.  She is now a DNR.  She was started on lipitor and pravachol stopped and fludrocortisone was stopped previously to hospitalization.  She has been working in garden and Field seismologist.  Past Medical History:  Diagnosis Date  . Closed fracture of one rib of right side with routine healing 06/30/2017  . Current use of long term anticoagulation 07/21/2017  . Depression   . First degree atrioventricular block 06/26/2017  . Hammer toe 06/26/2017  . Hyperlipidemia 06/26/2017  . Other specified cardiac arrhythmias 06/26/2017  . Pain in limb 06/26/2017  . Palpitations 06/26/2017  . Premature atrial complex 06/26/2017  . Premature ventricular contractions 06/26/2017  . PSVT (paroxysmal supraventricular tachycardia) (HCC) 11/10/2017  . Stroke (HCC)   . Traumatic pneumothorax 06/30/2017    Past Surgical History:  Procedure Laterality Date  . ABDOMINAL HYSTERECTOMY    . SHOULDER SURGERY    . TONSILLECTOMY      Family History  Problem Relation Age of Onset  . Bone cancer Mother   . Breast cancer Father   . Diabetes Father   . Heart disease Father   . Breast cancer Sister     Social History   Socioeconomic History  . Marital status: Widowed    Spouse name: Not on file  . Number of children: 2  . Years of education: Not on file  . Highest education level: Not on file    Occupational History  . Not on file  Tobacco Use  . Smoking status: Never Smoker  . Smokeless tobacco: Never Used  Substance and Sexual Activity  . Alcohol use: No  . Drug use: No  . Sexual activity: Not Currently  Other Topics Concern  . Not on file  Social History Narrative   Mrs. Bayliss is widowed. Her daughter, [name], is her POA and lives 12 miles away. She resides in her own home, alone for last 7 years. Since her CVA she has had someone with her most of the time. She has a hired caregiver or a family member stays most of the day. She feel safe in her home and in her neighborhood.   Social Determinants of Health   Financial Resource Strain:   . Difficulty of Paying Living Expenses:   Food Insecurity:   . Worried About Programme researcher, broadcasting/film/video in the Last Year:   . Barista in the Last Year:   Transportation Needs:   . Freight forwarder (Medical):   [name] Lack of Transportation (Non-Medical):   Physical Activity:   . Days of Exercise per Week:   . Minutes of Exercise per Session:   Stress:   . Feeling of Stress :   Social Connections:   . Frequency of Communication with Friends and Family:   . Frequency of Social Gatherings with Friends and Family:   . Attends Religious Services:   .  Active Member of Clubs or Organizations:   . Attends Banker Meetings:   [name] Marital Status:   Intimate Partner Violence:   . Fear of Current or Ex-Partner:   . Emotionally Abused:   [name] Physically Abused:   . Sexually Abused:     Outpatient Medications Prior to Visit  Medication Sig Dispense Refill  . acebutolol (SECTRAL) 200 MG capsule Take 1 capsule (200 mg total) by mouth daily. (Patient taking differently: Take 200 mg by mouth every other day. ) 90 capsule 4  . Apoaequorin (PREVAGEN PO) Take 1 tablet by mouth daily.    [name] atorvastatin (LIPITOR) 40 MG tablet Take 40 mg by mouth daily.    . Blood Pressure Monitoring (B-D ASSURE BPM/AUTO ARM CUFF) MISC 1  application by Does not apply route daily. i10 1 each 0  . Cholecalciferol (VITAMIN D3) 1000 units CAPS Take 1,000 Units by mouth daily.    . ciprofloxacin (CILOXAN) 0.3 % ophthalmic solution 4 drops 2 (two) times daily.    [name] dexamethasone (DECADRON) 0.1 % ophthalmic solution SMARTSIG:4 Drop(s) Right Ear Twice Daily    . mirtazapine (REMERON) 15 MG tablet Take 1 tablet (15 mg total) by mouth daily. 30 tablet 2  . NUTRITIONAL SUPPLEMENTS PO Take 1 tablet by mouth daily. Taking Advanced Hearing    . OVER THE COUNTER MEDICATION Take 1 tablet by mouth daily. Selenex GSH supplement    . prednisoLONE acetate (PRED FORTE) 1 % ophthalmic suspension 1 drop 4 (four) times daily.    . vitamin B-12 (CYANOCOBALAMIN) 1000 MCG tablet Take 1,000 mcg by mouth daily.    [name] 10 MG TABS tablet TAKE 1 TABLET BY MOUTH EVERY DAY 30 tablet 6   No facility-administered medications prior to visit.    Allergies  Allergen Reactions  . Codeine Nausea And Vomiting  . Penicillins Rash  . Sulfa Antibiotics Nausea And Vomiting    ROS Review of Systems  Constitutional: Negative.   HENT: Positive for hearing loss.   Eyes: Negative.   Respiratory: Negative.   Cardiovascular: Negative.   Gastrointestinal: Negative.   Endocrine: Negative.   Genitourinary: Negative.   Musculoskeletal: Positive for arthralgias.  Skin: Negative.   Neurological: Negative.   Psychiatric/Behavioral: Negative.       Objective:    Physical Exam  Constitutional: She is oriented to person, place, and time. She appears well-developed and well-nourished.  HENT:  Head: Normocephalic and atraumatic.  Right Ear: External ear normal.  Left Ear: External ear normal.  Nose: Nose normal.  Mouth/Throat: Oropharynx is clear and moist.  Eyes: Pupils are equal, round, and reactive to light. Conjunctivae and EOM are normal.  Cardiovascular: Normal rate, regular rhythm, normal heart sounds and intact distal pulses.  Pulmonary/Chest: Effort  normal and breath sounds normal.  Abdominal: Soft. Bowel sounds are normal.  Musculoskeletal:        General: Normal range of motion.     Cervical back: Normal range of motion and neck supple.     Comments: weak  Neurological: She is alert and oriented to person, place, and time. She has normal reflexes.  She has some dementia and is now DNR, they have this at home  Skin: Skin is warm and dry.  Psychiatric: She has a normal mood and affect. Her behavior is normal. Judgment and thought content normal.  Vitals reviewed.   BP (!) 90/52   Pulse 94   Temp (!) 97.3 F (36.3 C)   Resp 16  Ht 5\' 3"  (1.6 m)   Wt 105 lb (47.6 kg)   LMP  (LMP Unknown)   SpO2 96%   BMI 18.60 kg/m  Wt Readings from Last 3 Encounters:  01/24/20 105 lb (47.6 kg)  01/12/20 110 lb (49.9 kg)  12/06/19 107 lb (48.5 kg)     Health Maintenance Due  Topic Date Due  . TETANUS/TDAP  Never done  . DEXA SCAN  Never done  . PNA vac Low Risk Adult (2 of 2 - PCV13) 08/31/2019    There are no preventive care reminders to display for this patient.  No results found for: TSH Lab Results  Component Value Date   WBC 5.8 11/29/2019   HGB 12.4 11/29/2019   HCT 36.5 11/29/2019   MCV 102 (H) 11/29/2019   PLT 250 11/29/2019   Lab Results  Component Value Date   NA 145 (H) 01/12/2020   K 3.9 01/12/2020   CO2 24 01/12/2020   GLUCOSE 92 01/12/2020   BUN 14 01/12/2020   CREATININE 0.67 01/12/2020   BILITOT 0.3 01/12/2020   ALKPHOS 54 01/12/2020   AST 19 01/12/2020   ALT 12 01/12/2020   PROT 6.1 01/12/2020   ALBUMIN 4.0 01/12/2020   CALCIUM 8.8 01/12/2020   ANIONGAP 6 11/09/2018   Lab Results  Component Value Date   CHOL 213 (H) 10/10/2019   Lab Results  Component Value Date   HDL 58 10/10/2019   Lab Results  Component Value Date   LDLCALC 136 (H) 10/10/2019   Lab Results  Component Value Date   TRIG 109 10/10/2019   Lab Results  Component Value Date   CHOLHDL 3.7 10/10/2019   Lab Results    Component Value Date   HGBA1C 5.3 11/09/2018      Assessment & Plan:   Problem List Items Addressed This Visit      Cardiovascular and Mediastinum   Benign hypertension - Primary    An individual hypertension care plan was established and reinforced today.  The patient's status was assessed using clinical findings on exam and labs or diagnostic tests. The patient's success at meeting treatment goals on disease specific evidence-based guidelines and found to be fair controlled. SELF MANAGEMENT: The patient and I together assessed ways to personally work towards obtaining the recommended goals. RECOMMENDATIONS: avoid decongestants found in common cold remedies, decrease consumption of alcohol, perform routine monitoring of BP with home BP cuff, exercise, reduction of dietary salt, take medicines as prescribed, try not to miss doses and quit smoking.  Regular exercise and maintaining a healthy weight is needed.  Stress reduction may help. A CLINICAL SUMMARY including written plan identify barriers to care unique to individual due to social or financial issues.  We attempt to mutually creat solutions for individual and family understanding.      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 Patient is not in Hospice care that we arranged.       Other Visit Diagnoses    Precordial pain       Relevant Orders   Troponin I      No orders of the defined types were placed in this encounter.   Follow-up: Return in about 1 month (around 02/24/2020).    Reinaldo Meeker, MD

## 2020-01-24 NOTE — Assessment & Plan Note (Addendum)
She is more cognizant today, she probably has lacunar stroke before hospitalization. Patient is not in Hospice care that we arranged.

## 2020-01-25 LAB — COMPREHENSIVE METABOLIC PANEL
ALT: 11 IU/L (ref 0–32)
AST: 17 IU/L (ref 0–40)
Albumin/Globulin Ratio: 1.6 (ref 1.2–2.2)
Albumin: 4.2 g/dL (ref 3.5–4.6)
Alkaline Phosphatase: 67 IU/L (ref 48–121)
BUN/Creatinine Ratio: 25 (ref 12–28)
BUN: 21 mg/dL (ref 10–36)
Bilirubin Total: 0.5 mg/dL (ref 0.0–1.2)
CO2: 22 mmol/L (ref 20–29)
Calcium: 9.6 mg/dL (ref 8.7–10.3)
Chloride: 103 mmol/L (ref 96–106)
Creatinine, Ser: 0.83 mg/dL (ref 0.57–1.00)
GFR calc Af Amer: 71 mL/min/{1.73_m2} (ref >59)
GFR calc non Af Amer: 62 mL/min/{1.73_m2} (ref >59)
Globulin, Total: 2.6 g/dL (ref 1.5–4.5)
Glucose: 122 mg/dL — ABNORMAL HIGH (ref 65–99)
Potassium: 4.6 mmol/L (ref 3.5–5.2)
Sodium: 140 mmol/L (ref 134–144)
Total Protein: 6.8 g/dL (ref 6.0–8.5)

## 2020-01-25 LAB — CBC WITH DIFFERENTIAL/PLATELET
Basophils Absolute: 0 10*3/uL (ref 0.0–0.2)
Basos: 1 %
EOS (ABSOLUTE): 0.1 10*3/uL (ref 0.0–0.4)
Eos: 2 %
Hematocrit: 38 % (ref 34.0–46.6)
Hemoglobin: 12.6 g/dL (ref 11.1–15.9)
Immature Grans (Abs): 0 10*3/uL (ref 0.0–0.1)
Immature Granulocytes: 0 %
Lymphocytes Absolute: 2 10*3/uL (ref 0.7–3.1)
Lymphs: 34 %
MCH: 34.4 pg — ABNORMAL HIGH (ref 26.6–33.0)
MCHC: 33.2 g/dL (ref 31.5–35.7)
MCV: 104 fL — ABNORMAL HIGH (ref 79–97)
Monocytes Absolute: 0.7 10*3/uL (ref 0.1–0.9)
Monocytes: 12 %
Neutrophils Absolute: 3.1 10*3/uL (ref 1.4–7.0)
Neutrophils: 51 %
Platelets: 247 10*3/uL (ref 150–450)
RBC: 3.66 x10E6/uL — ABNORMAL LOW (ref 3.77–5.28)
RDW: 12.7 % (ref 11.7–15.4)
WBC: 6 10*3/uL (ref 3.4–10.8)

## 2020-01-25 LAB — TROPONIN I: Troponin I: <0.01 ng/mL (ref 0.00–0.04)

## 2020-01-25 NOTE — Progress Notes (Signed)
No anemia, glucose 122, kidney tests normal, liver tests normal, troponin I ,0.01 normal lp

## 2020-02-13 ENCOUNTER — Other Ambulatory Visit: Payer: PPO

## 2020-02-16 ENCOUNTER — Ambulatory Visit (INDEPENDENT_AMBULATORY_CARE_PROVIDER_SITE_OTHER): Payer: PPO | Admitting: Legal Medicine

## 2020-02-16 ENCOUNTER — Other Ambulatory Visit: Payer: Self-pay

## 2020-02-16 ENCOUNTER — Encounter: Payer: Self-pay | Admitting: Legal Medicine

## 2020-02-16 VITALS — BP 130/70 | HR 63 | Temp 97.3°F | Resp 17 | Ht 63.0 in | Wt 104.0 lb

## 2020-02-16 DIAGNOSIS — I480 Paroxysmal atrial fibrillation: Secondary | ICD-10-CM

## 2020-02-16 DIAGNOSIS — F0151 Vascular dementia with behavioral disturbance: Secondary | ICD-10-CM

## 2020-02-16 DIAGNOSIS — I10 Essential (primary) hypertension: Secondary | ICD-10-CM | POA: Diagnosis not present

## 2020-02-16 DIAGNOSIS — E440 Moderate protein-calorie malnutrition: Secondary | ICD-10-CM | POA: Diagnosis not present

## 2020-02-16 DIAGNOSIS — Z7901 Long term (current) use of anticoagulants: Secondary | ICD-10-CM

## 2020-02-16 DIAGNOSIS — E782 Mixed hyperlipidemia: Secondary | ICD-10-CM | POA: Diagnosis not present

## 2020-02-16 DIAGNOSIS — F01518 Vascular dementia, unspecified severity, with other behavioral disturbance: Secondary | ICD-10-CM

## 2020-02-16 DIAGNOSIS — Z681 Body mass index (BMI) 19 or less, adult: Secondary | ICD-10-CM | POA: Insufficient documentation

## 2020-02-16 DIAGNOSIS — N182 Chronic kidney disease, stage 2 (mild): Secondary | ICD-10-CM

## 2020-02-16 HISTORY — DX: Body mass index (BMI) 19.9 or less, adult: Z68.1

## 2020-02-16 MED ORDER — ATORVASTATIN CALCIUM 40 MG PO TABS: 40.0000 mg | ORAL_TABLET | Freq: Every day | ORAL | 2 refills | Status: DC

## 2020-02-16 NOTE — Assessment & Plan Note (Signed)
Supplement nutrition with protein/calorie supplement with meals to improve nutritional status. 

## 2020-02-16 NOTE — Progress Notes (Signed)
Established Patient Office Visit  Subjective:  Patient ID: [name], female    DOB: Dec 06, 1925  Age: 86 y.o. MRN: [id number]  CC:  Chief Complaint  Patient presents with  . Hypertension  . Hyperlipidemia    HPI CYERRA Singh presents for Chronic follow up.  Patient presents for follow up of hypertension.  Patient tolerating acebutol well with side effects.  Patient was diagnosed with hypertension 2010 so has been treated for hypertension for 10 years.Patient is working on maintaining diet and exercise regimen and follows up as directed. Complication include lacunar strokes.  Patient presents with hyperlipidemia.  Compliance with treatment has been good; patient takes medicines as directed, maintains low cholesterol diet, follows up as directed, and maintains exercise regimen.  Patient is using atorvastin without problems.  Vascular dementia: She has had 2 recent lacunar strokes and we are awaiting [place] neurologic evaluation.  No driving yet.  Past Medical History:  Diagnosis Date  . Closed fracture of one rib of right side with routine healing 06/30/2017  . Current use of long term anticoagulation 07/21/2017  . Depression   . First degree atrioventricular block 06/26/2017  . Hammer toe 06/26/2017  . Hyperlipidemia 06/26/2017  . Other specified cardiac arrhythmias 06/26/2017  . Pain in limb 06/26/2017  . Palpitations 06/26/2017  . Premature atrial complex 06/26/2017  . Premature ventricular contractions 06/26/2017  . PSVT (paroxysmal supraventricular tachycardia) ([place]) 11/10/2017  . Stroke ([place])   . Traumatic pneumothorax 06/30/2017    Past Surgical History:  Procedure Laterality Date  . ABDOMINAL HYSTERECTOMY    . SHOULDER SURGERY    . TONSILLECTOMY      Family History  Problem Relation Age of Onset  . Bone cancer Mother   . Breast cancer Father   . Diabetes Father   . Heart disease Father   . Breast cancer Sister     Social History    Socioeconomic History  . Marital status: Widowed    Spouse name: Not on file  . Number of children: 2  . Years of education: Not on file  . Highest education level: Not on file  Occupational History  . Not on file  Tobacco Use  . Smoking status: Never Smoker  . Smokeless tobacco: Never Used  Vaping Use  . Vaping Use: Never used  Substance and Sexual Activity  . Alcohol use: No  . Drug use: No  . Sexual activity: Not Currently  Other Topics Concern  . Not on file  Social History Narrative   [name] is widowed. Her daughter, [name], is her POA and lives 12 miles away. She resides in her own home, alone for last 7 years. Since her CVA she has had someone with her most of the time. She has a hired caregiver or a family member stays most of the day. She feel safe in her home and in her neighborhood.   Social Determinants of Health   Financial Resource Strain:   . Difficulty of Paying Living Expenses:   Food Insecurity:   . Worried About Charity fundraiser in the Last Year:   . Arboriculturist in the Last Year:   Transportation Needs:   . Film/video editor (Medical):   [name] Lack of Transportation (Non-Medical):   Physical Activity:   . Days of Exercise per Week:   . Minutes of Exercise per Session:   Stress:   . Feeling of Stress :   Social Connections:   .  Frequency of Communication with Friends and Family:   . Frequency of Social Gatherings with Friends and Family:   . Attends Religious Services:   . Active Member of Clubs or Organizations:   . Attends Banker Meetings:   [name] Marital Status:   Intimate Partner Violence:   . Fear of Current or Ex-Partner:   . Emotionally Abused:   [name] Physically Abused:   . Sexually Abused:     Outpatient Medications Prior to Visit  Medication Sig Dispense Refill  . acebutolol (SECTRAL) 200 MG capsule Take 1 capsule (200 mg total) by mouth daily. (Patient taking differently: Take 200 mg by mouth every other  day. ) 90 capsule 4  . Apoaequorin (PREVAGEN PO) Take 1 tablet by mouth daily.    . Cholecalciferol (VITAMIN D3) 1000 units CAPS Take 1,000 Units by mouth daily.    [name] docusate sodium (STOOL SOFTENER) 100 MG capsule Take 100 mg by mouth daily.    . mirtazapine (REMERON) 15 MG tablet Take 1 tablet (15 mg total) by mouth daily. 30 tablet 2  . NUTRITIONAL SUPPLEMENTS PO Take 1 tablet by mouth daily. Taking Advanced Hearing    . OVER THE COUNTER MEDICATION Take 1 tablet by mouth daily. Selenex GSH supplement    . vitamin B-12 (CYANOCOBALAMIN) 1000 MCG tablet Take 1,000 mcg by mouth daily.    [name] 10 MG TABS tablet TAKE 1 TABLET BY MOUTH EVERY DAY 30 tablet 6  . atorvastatin (LIPITOR) 40 MG tablet Take 40 mg by mouth daily.    . Blood Pressure Monitoring (B-D ASSURE BPM/AUTO ARM CUFF) MISC 1 application by Does not apply route daily. i10 1 each 0  . ciprofloxacin (CILOXAN) 0.3 % ophthalmic solution 4 drops 2 (two) times daily.    [name] dexamethasone (DECADRON) 0.1 % ophthalmic solution SMARTSIG:4 Drop(s) Right Ear Twice Daily    . prednisoLONE acetate (PRED FORTE) 1 % ophthalmic suspension 1 drop 4 (four) times daily.     No facility-administered medications prior to visit.    Allergies  Allergen Reactions  . Codeine Nausea And Vomiting  . Penicillins Rash  . Sulfa Antibiotics Nausea And Vomiting    ROS Review of Systems  Constitutional: Negative.   HENT: Negative.   Eyes: Negative.   Cardiovascular: Negative.   Gastrointestinal: Negative.   Genitourinary: Negative.   Neurological: Negative.   Psychiatric/Behavioral: Positive for confusion.      Objective:    Physical Exam Vitals reviewed.  Constitutional:      Appearance: Normal appearance.  HENT:     Head: Normocephalic and atraumatic.     Right Ear: Tympanic membrane normal.     Left Ear: Tympanic membrane normal.     Nose: Nose normal.     Mouth/Throat:     Mouth: Mucous membranes are dry.  Cardiovascular:     Rate  and Rhythm: Normal rate and regular rhythm.     Pulses: Normal pulses.     Heart sounds: Normal heart sounds.  Pulmonary:     Effort: Pulmonary effort is normal.     Breath sounds: Normal breath sounds.  Musculoskeletal:     Cervical back: Normal range of motion and neck supple.  Neurological:     Mental Status: She is alert. She is confused.     Cranial Nerves: Cranial nerves are intact.     Motor: Weakness present.     Coordination: Romberg sign positive.     Gait: Gait abnormal.     Deep  Tendon Reflexes:     Reflex Scores:      Tricep reflexes are 2+ on the right side and 2+ on the left side.      Bicep reflexes are 2+ on the right side and 2+ on the left side.      Brachioradialis reflexes are 2+ on the right side and 2+ on the left side.      Patellar reflexes are 2+ on the right side and 2+ on the left side.      Achilles reflexes are 2+ on the right side and 2+ on the left side.    Comments: Unstable walking and reaching     BP 130/70 (BP Location: Right Arm, Patient Position: Sitting)   Pulse 63   Temp (!) 97.3 F (36.3 C) (Temporal)   Resp 17   Ht 5\' 3"  (1.6 m)   Wt 104 lb (47.2 kg)   LMP  (LMP Unknown)   SpO2 98%   BMI 18.42 kg/m  Wt Readings from Last 3 Encounters:  02/16/20 104 lb (47.2 kg)  01/24/20 105 lb (47.6 kg)  01/12/20 110 lb (49.9 kg)     Health Maintenance Due  Topic Date Due  . TETANUS/TDAP  Never done  . DEXA SCAN  Never done  . PNA vac Low Risk Adult (2 of 2 - PCV13) 08/31/2019    There are no preventive care reminders to display for this patient.  No results found for: TSH Lab Results  Component Value Date   WBC 6.0 01/24/2020   HGB 12.6 01/24/2020   HCT 38.0 01/24/2020   MCV 104 (H) 01/24/2020   PLT 247 01/24/2020   Lab Results  Component Value Date   NA 140 01/24/2020   K 4.6 01/24/2020   CO2 22 01/24/2020   GLUCOSE 122 (H) 01/24/2020   BUN 21 01/24/2020   CREATININE 0.83 01/24/2020   BILITOT 0.5 01/24/2020   ALKPHOS 67  01/24/2020   AST 17 01/24/2020   ALT 11 01/24/2020   PROT 6.8 01/24/2020   ALBUMIN 4.2 01/24/2020   CALCIUM 9.6 01/24/2020   ANIONGAP 6 11/09/2018   Lab Results  Component Value Date   CHOL 213 (H) 10/10/2019   Lab Results  Component Value Date   HDL 58 10/10/2019   Lab Results  Component Value Date   LDLCALC 136 (H) 10/10/2019   Lab Results  Component Value Date   TRIG 109 10/10/2019   Lab Results  Component Value Date   CHOLHDL 3.7 10/10/2019   Lab Results  Component Value Date   HGBA1C 5.3 11/09/2018      Assessment & Plan:   Problem List Items Addressed This Visit      Cardiovascular and Mediastinum   Essential hypertension, benign    An individual hypertension care plan was established and reinforced today.  The patient's status was assessed using clinical findings on exam and labs or diagnostic tests. The patient's success at meeting treatment goals on disease specific evidence-based guidelines and found to be well controlled. SELF MANAGEMENT: The patient and I together assessed ways to personally work towards obtaining the recommended goals. RECOMMENDATIONS: avoid decongestants found in common cold remedies, decrease consumption of alcohol, perform routine monitoring of BP with home BP cuff, exercise, reduction of dietary salt, take medicines as prescribed, try not to miss doses and quit smoking.  Regular exercise and maintaining a healthy weight is needed.  Stress reduction may help. A CLINICAL SUMMARY including written plan identify barriers to care  unique to individual due to social or financial issues.  We attempt to mutually creat solutions for individual and family understanding.      Relevant Medications   atorvastatin (LIPITOR) 40 MG tablet   Dementia, multiinfarct, with behavioral disturbance (HCC) - Primary    Patient's dementia is stable MMSE remains 20.  He remains dependent with some supervision for medicines.  An individual plan was formulated for  this patient based on patient history and physical exam, x-rays an other tests.  Evidence based decisions made.      Relevant Medications   atorvastatin (LIPITOR) 40 MG tablet   Paroxysmal atrial fibrillation (HCC)    AN INDIVIDUAL CARE PLAN for atrial fibrillation was established and reinforced today.  The patient's status was assessed using clinical findings on exam, labs, and other diagnostic testing. Patient's success at meeting treatment goals based on disease specific evidence-bassed guidelines and found to be in good control. RECOMMENDATIONS include maintaining  present medicines and treatment. And anticoagulation.      Relevant Medications   atorvastatin (LIPITOR) 40 MG tablet     Genitourinary   Chronic kidney disease, stage II (mild)    AN INDIVIDUAL CARE PLAN for renal failure was established and reinforced today.  The patient's status was assessed using clinical findings on exam, labs, and other diagnostic testing. Patient's success at meeting treatment goals based on disease specific evidence-bassed guidelines and found to be in good control. RECOMMENDATIONS include maintaining present medicines and treatment.        Other   Current use of long term anticoagulation    Continue anticoagulant , no bleed problems      Hyperlipidemia    AN INDIVIDUAL CARE PLAN for hyperlipidemia/ cholesterol was established and reinforced today.  The patient's status was assessed using clinical findings on exam, lab and other diagnostic tests. The patient's disease status was assessed based on evidence-based guidelines and found to be well controlled. MEDICATIONS were reviewed. SELF MANAGEMENT GOALS have been discussed and patient's success at attaining the goal of low cholesterol was assessed. RECOMMENDATION given include regular exercise 3 days a week and low cholesterol/low fat diet. CLINICAL SUMMARY including written plan to identify barriers unique to the patient due to social or economic   reasons was discussed.      Relevant Medications   atorvastatin (LIPITOR) 40 MG tablet   Other Relevant Orders   Lipid panel   Malnutrition of moderate degree (HCC)    Supplement nutrition with protein/calorie supplement with meals to improve nutritional status.      Body mass index (BMI) less than 19    We discussed protein/calorie supplements to increase nutrition       Other Visit Diagnoses    Benign essential hypertension       Relevant Medications   atorvastatin (LIPITOR) 40 MG tablet   Other Relevant Orders   CBC with Differential/Platelet   Comprehensive metabolic panel      Meds ordered this encounter  Medications  . atorvastatin (LIPITOR) 40 MG tablet    Sig: Take 1 tablet (40 mg total) by mouth daily.    Dispense:  90 tablet    Refill:  2    Follow-up: Return in about 3 months (around 05/18/2020) for fasting.    Brent Bulla, MD

## 2020-02-16 NOTE — Assessment & Plan Note (Signed)
Patient's dementia is stable MMSE remains 20.  He remains dependent with some supervision for medicines.  An individual plan was formulated for this patient based on patient history and physical exam, x-rays an other tests.  Evidence based decisions made.

## 2020-02-16 NOTE — Assessment & Plan Note (Signed)

## 2020-02-16 NOTE — Assessment & Plan Note (Signed)
We discussed protein/calorie supplements to increase nutrition

## 2020-02-16 NOTE — Assessment & Plan Note (Signed)

## 2020-02-16 NOTE — Assessment & Plan Note (Signed)
AN INDIVIDUAL CARE PLAN for atrial fibrillation was established and reinforced today.  The patient's status was assessed using clinical findings on exam, labs, and other diagnostic testing. Patient's success at meeting treatment goals based on disease specific evidence-bassed guidelines and found to be in good control. RECOMMENDATIONS include maintaining  present medicines and treatment. And anticoagulation.

## 2020-02-16 NOTE — Assessment & Plan Note (Signed)
AN INDIVIDUAL CARE PLAN for renal failure was established and reinforced today.  The patient's status was assessed using clinical findings on exam, labs, and other diagnostic testing. Patient's success at meeting treatment goals based on disease specific evidence-bassed guidelines and found to be in good control. RECOMMENDATIONS include maintaining present medicines and treatment. 

## 2020-02-16 NOTE — Assessment & Plan Note (Signed)
Continue anticoagulant , no bleed problems

## 2020-02-17 LAB — COMPREHENSIVE METABOLIC PANEL
ALT: 15 IU/L (ref 0–32)
AST: 23 IU/L (ref 0–40)
Albumin/Globulin Ratio: 1.9 (ref 1.2–2.2)
Albumin: 4.4 g/dL (ref 3.5–4.6)
Alkaline Phosphatase: 65 IU/L (ref 48–121)
BUN/Creatinine Ratio: 23 (ref 12–28)
BUN: 17 mg/dL (ref 10–36)
Bilirubin Total: 0.4 mg/dL (ref 0.0–1.2)
CO2: 25 mmol/L (ref 20–29)
Calcium: 9.4 mg/dL (ref 8.7–10.3)
Chloride: 102 mmol/L (ref 96–106)
Creatinine, Ser: 0.73 mg/dL (ref 0.57–1.00)
GFR calc Af Amer: 83 mL/min/{1.73_m2} (ref >59)
GFR calc non Af Amer: 72 mL/min/{1.73_m2} (ref >59)
Globulin, Total: 2.3 g/dL (ref 1.5–4.5)
Glucose: 91 mg/dL (ref 65–99)
Potassium: 4.9 mmol/L (ref 3.5–5.2)
Sodium: 139 mmol/L (ref 134–144)
Total Protein: 6.7 g/dL (ref 6.0–8.5)

## 2020-02-17 LAB — CBC WITH DIFFERENTIAL/PLATELET
Basophils Absolute: 0 10*3/uL (ref 0.0–0.2)
Basos: 1 %
EOS (ABSOLUTE): 0.1 10*3/uL (ref 0.0–0.4)
Eos: 2 %
Hematocrit: 35.9 % (ref 34.0–46.6)
Hemoglobin: 12.4 g/dL (ref 11.1–15.9)
Immature Grans (Abs): 0 10*3/uL (ref 0.0–0.1)
Immature Granulocytes: 0 %
Lymphocytes Absolute: 2.2 10*3/uL (ref 0.7–3.1)
Lymphs: 38 %
MCH: 34.3 pg — ABNORMAL HIGH (ref 26.6–33.0)
MCHC: 34.5 g/dL (ref 31.5–35.7)
MCV: 99 fL — ABNORMAL HIGH (ref 79–97)
Monocytes Absolute: 0.7 10*3/uL (ref 0.1–0.9)
Monocytes: 13 %
Neutrophils Absolute: 2.7 10*3/uL (ref 1.4–7.0)
Neutrophils: 46 %
Platelets: 212 10*3/uL (ref 150–450)
RBC: 3.61 x10E6/uL — ABNORMAL LOW (ref 3.77–5.28)
RDW: 12.3 % (ref 11.7–15.4)
WBC: 5.7 10*3/uL (ref 3.4–10.8)

## 2020-02-17 LAB — LIPID PANEL
Chol/HDL Ratio: 2.8 ratio (ref 0.0–4.4)
Cholesterol, Total: 143 mg/dL (ref 100–199)
HDL: 52 mg/dL (ref >39)
LDL Chol Calc (NIH): 76 mg/dL (ref 0–99)
Triglycerides: 75 mg/dL (ref 0–149)
VLDL Cholesterol Cal: 15 mg/dL (ref 5–40)

## 2020-02-17 LAB — CARDIOVASCULAR RISK ASSESSMENT

## 2020-02-17 NOTE — Progress Notes (Signed)
CBC normal, glucose 122, kidney tests normal, liver tests normal, Cholesterol normal,  lp

## 2020-02-20 ENCOUNTER — Ambulatory Visit: Payer: PPO

## 2020-02-20 DIAGNOSIS — I10 Essential (primary) hypertension: Secondary | ICD-10-CM

## 2020-02-20 DIAGNOSIS — E782 Mixed hyperlipidemia: Secondary | ICD-10-CM

## 2020-02-20 NOTE — Patient Instructions (Signed)
Visit Information  Goals Addressed   None     The patient verbalized understanding of instructions provided today and declined a print copy of patient instruction materials.   Telephone follow up appointment with pharmacy team member scheduled for: 03/23/2020  Sherre Poot, PharmD, [facility] Clinical Pharmacist [facility] [phone] (office) [phone] (mobile)   Basics of Medicine Management Taking your medicines correctly is an important part of managing or preventing medical problems. Make sure you know what disease or condition your medicine is treating, and how and when to take it. If you do not take your medicine correctly, it may not work well and may cause unpleasant side effects, including serious health problems. What should I do when I am taking medicines?   Read all the labels and inserts that come with your medicines. Review the information often.  Talk with your pharmacist if you get a refill and notice a change in the size, color, or shape of your medicines.  Know the potential side effects for each medicine that you take.  Try to get all your medicines from the same pharmacy. The pharmacist will have all your information and will understand how your medicines will affect each other (interact).  Tell your health care provider about all your medicines, including over-the-counter medicines, vitamins, and herbal or dietary supplements. He or she will make sure that nothing will interact with any of your prescribed medicines. How can I take my medicines safely?  Take medicines only as told by your health care provider. ? Do not take more of your medicine than instructed. ? Do not take anyone else's medicines. ? Do not share your medicines with others. ? Do not stop taking your medicines unless your health care provider tells you to do so. ? You may need to avoid alcohol or certain foods or liquids when taking certain medicines. Follow your health care  provider's instructions.  Do not split, mash, or chew your medicines unless your health care provider tells you to do so. Tell your health care provider if you have trouble swallowing your medicines.  For liquid medicine, use the dosing container that was provided. How should I organize my medicines?  Know your medicines  Know what each of your medicines looks like. This includes size, color, and shape. Tell your health care provider if you are having trouble recognizing all the medicines that you are taking.  If you cannot tell your medicines apart because they look similar, keep them in original bottles.  If you cannot read the labels on the bottles, tell your pharmacist to put your medicines in containers with large print.  Review your medicines and your schedule with family members, a friend, or a caregiver. Use a pill organizer  Use a tool to organize your medicine schedule. Tools include a weekly pillbox, a written chart, a notebook, or a calendar.  Your tool should help you remember the following things about each medicine: ? The name of the medicine. ? The amount (dose) to take. ? The schedule. This is the day and time the medicine should be taken. ? The appearance. This includes color, shape, size, and stamp. ? How to take your medicines. This includes instructions to take them with food, without food, with fluids, or with other medicines.  Create reminders for taking your medicines. Use sticky notes, or alarms on your watch, mobile device, or phone calendar.  You may choose to use a more advanced management system. These systems have storage, alarms, and  visual and audio prompts.  Some medicines can be taken on an "as-needed" basis. These include medicines for nausea or pain. If you take an as-needed medicine, write down the name and dose, as well as the date and time that you took it. How should I plan for travel?  Take your pillbox, medicines, and organization system  with you when traveling.  Have your medicines refilled before you travel. This will ensure that you do not run out of your medicines while you are away from home.  Always carry an updated list of your medicines with you. If there is an emergency, a first responder can quickly see what medicines you are taking.  Do not pack your medicines in checked luggage in case your luggage is lost or delayed.  If any of your medicines is considered a controlled substance, make sure you bring a letter from your health care provider with you. How should I store and discard my medicines? For safe storage:  Store medicines in a cool, dry area away from light, or as directed by your health care provider. Do not store medicines in the bathroom. Heat and humidity will affect them.  Do not store your medicines with other chemicals, or with medicines for pets or other household members.  Keep medicines away from children and pets. Do not leave them on counters or bedside tables. Store them in high cabinets or on high shelves. For safe disposal:  Check expiration dates regularly. Do not take expired medicines. Discard medicines that are older than the expiration date.  Learn a safe way to dispose of your medicines. You may: ? Use a local government, hospital, or pharmacy medicine-take-back program. ? Mix the medicines with inedible substances, put them in a sealed bag or empty container, and throw them in the trash. What should I remember?  Tell your health care provider if you: ? Experience side effects. ? Have new symptoms. ? Have other concerns about taking your medicines.  Review your medicines regularly with your health care provider. Other medicines, diet, medical conditions, weight changes, and daily habits can all affect how medicines work. Ask if you need to continue taking each medicine, and discuss how well each one is working.  Refill your medicines early to avoid running out of them.  In  case of an accidental overdose, call your local Poison Control Center at [phone] or visit your local emergency department immediately. This is important. Summary  Taking your medicines correctly is an important part of managing or preventing medical problems.  You need to make sure that you understand what you are taking a medicine for, as well as how and when you need to take it.  Know your medicines and use a pill organizer to help you take your medicines correctly.  In case of an accidental overdose, call your local Poison Control Center at [phone] or visit your local emergency department immediately. This is important. This information is not intended to replace advice given to you by your health care provider. Make sure you discuss any questions you have with your health care provider. Document Revised: 08/13/2017 Document Reviewed: 08/13/2017 Elsevier Patient Education  2020 [employer].

## 2020-02-20 NOTE — Chronic Care Management (AMB) (Signed)
Chronic Care Management Pharmacy  Name: TNIYA BOWDITCH  MRN: [id number] DOB: [birth year]  Chief Complaint/ HPI  [name],  86 y.o. , female presents for their Follow-Up CCM visit with the clinical pharmacist via telephone due to COVID-19 Pandemic.  PCP : [name], MD  Their chronic conditions include: atrial fibrillation, HTN, CKD II, HLD, Adjustment disorder with depressed mood  Office Visits: 02/16/2020 - waiting for [facility] appt.  01/24/2020 - recent lacunar stroke. Atorvastatin as opposed to pravastatin.  01/12/2020 - stopped fludrocortisone.  12/06/2019 - Acute cystitis - macrobid ordered. Changed acebutolol to QOD due to low bp. Added boost to improve nutrition.  11/29/2019 - No anemia, glucose 106, kidney tests lower. Headache for a few days and fatigue. Ordered CT scan to rule out bleed.  10/24/2019 - Ears cleaned out in office.  10/17/2019 - No medication changes. Stable on current regimen.    Consult Visit: 01/23/2020 - ENT for hearing loss.  10/21/2019 - Dr. [name] - EKG ordered.The ekg ordered today demonstrates sinus rhythm first-degree heart block otherwise normal. Afib stable continue beta blocker and anticoagulant. Stable hyperlipidemia.   Medications: Outpatient Encounter Medications as of 02/20/2020  Medication Sig  . acebutolol (SECTRAL) 200 MG capsule Take 1 capsule (200 mg total) by mouth daily.  [name] Apoaequorin (PREVAGEN PO) Take 1 tablet by mouth daily.  [name] atorvastatin (LIPITOR) 40 MG tablet Take 1 tablet (40 mg total) by mouth daily.  . Cholecalciferol (VITAMIN D3) 1000 units CAPS Take 1,000 Units by mouth daily.  [name] docusate sodium (STOOL SOFTENER) 100 MG capsule Take 100 mg by mouth daily.  . mirtazapine (REMERON) 15 MG tablet Take 1 tablet (15 mg total) by mouth daily.  [name] NUTRITIONAL SUPPLEMENTS PO Take 1 tablet by mouth daily. Taking Advanced Hearing  . OVER THE COUNTER MEDICATION Take 1 tablet by mouth daily. Selenex GSH  supplement  . XARELTO 10 MG TABS tablet TAKE 1 TABLET BY MOUTH EVERY DAY  . vitamin B-12 (CYANOCOBALAMIN) 1000 MCG tablet Take 1,000 mcg by mouth daily.   No facility-administered encounter medications on file as of 02/20/2020.   Allergies  Allergen Reactions  . Codeine Nausea And Vomiting  . Penicillins Rash  . Sulfa Antibiotics Nausea And Vomiting    SDOH Screenings   Alcohol Screen:   . Last Alcohol Screening Score (AUDIT):   Depression (PHQ2-9):   . PHQ-2 Score:   Financial Resource Strain:   . Difficulty of Paying Living Expenses:   Food Insecurity:   . Worried About Charity fundraiser in the Last Year:   . [employer] of Food in the Last Year:   Housing: [place]   . Last Housing Risk Score: 0  Physical Activity:   . Days of Exercise per Week:   . Minutes of Exercise per Session:   Social Connections:   . Frequency of Communication with Friends and Family:   . Frequency of Social Gatherings with Friends and Family:   . Attends Religious Services:   . Active Member of Clubs or Organizations:   . Attends Archivist Meetings:   [name] Marital Status:   Stress:   . Feeling of Stress :   Tobacco Use: Low Risk   . Smoking Tobacco Use: Never Smoker  . Smokeless Tobacco Use: Never Used  Transportation Needs:   . Film/video editor (Medical):   [name] Lack of Transportation (Non-Medical):      Current Diagnosis/Assessment:  Goals Addressed  This Visit's Progress   . Pharmacy Care Plan       CARE PLAN ENTRY (see longitudinal plan of care for additional care plan information)  Current Barriers:  . Uncontrolled hyperlipidemia, complicated by age, htn . Current antihyperlipidemic regimen: atorvastatin . Previous antihyperlipidemic medications tried pravastatin . Most recent lipid panel:     Component Value Date/Time   CHOL 143 02/16/2020 1135   TRIG 75 02/16/2020 1135   HDL 52 02/16/2020 1135   CHOLHDL 2.8 02/16/2020 1135   CHOLHDL 4.3  11/09/2018 0512   VLDL 11 11/09/2018 0512   LDLCALC 76 02/16/2020 1135 .   [name] ASCVD risk enhancing conditions: age >13, DM, HTN, CKD, CHF, current smoker . 10-year ASCVD risk score: Hx of Stroke  Pharmacist Clinical Goal(s):  [name] Over the next 60 days, patient will work with PharmD and providers towards optimized antihyperlipidemic therapy  Interventions: . Comprehensive medication review performed; medication list updated in electronic medical record.  [name] care team collaboration (see longitudinal plan of care) . Ensure safety, efficacy, and affordability of medications  Patient Self Care Activities:  . Patient will focus on medication adherence by continuing to use daily medication planner.   Please see past updates related to this goal by clicking on the "Past Updates" button in the selected goal      . [place] (see longitudinal plan of care for additional care plan information)  Current Barriers:  . Hypertension/Afib, complicated by hypotension . Current antihypertensive regimen: acebutolol 200 mg daily . Last practice recorded BP readings:  BP Readings from Last 3 Encounters:  02/16/20 130/70  01/24/20 (!) 90/52  01/12/20 134/70 .   [name] Current home BP readings: Waiting on monitor to be approved . Most recent eGFR/CrCl: No results found for: EGFR  No components found for: CRCL  Pharmacist Clinical Goal(s):  [name] Over the next 60 days, patient will work with PharmD and providers to optimize blood pressure readings.   Interventions: . Inter-disciplinary care team collaboration (see longitudinal plan of care) . Comprehensive medication review performed; medication list updated in the electronic medical record.  . Ensure safety, efficacy, and affordability of medications   Patient Self Care Activities:  . Patient will continue to check BP weekly once meter available , document, and provide at future appointments . Patient will  focus on medication adherence by continuing to use daily medication planner.  Please see past updates related to this goal by clicking on the "Past Updates" button in the selected goal         AFIB/HTN    Patient is currently rate controlled.  Patient has failed these meds in past: fludrocortisone Patient is currently controlled on the following medications: acebutolol 200 mg daily, xarelto 10 mg daily  Office blood pressures are  BP Readings from Last 3 Encounters:  12/06/19 100/60  11/29/19 100/60  10/21/19 108/62   Patient checks BP at home: working to get a bp monitor Patient home BP readings are ranging: has had low bp lately. Getting a monitor to use at home.   We discussed:  diet and exercise extensively. Patient feels weak and low energy when bp is low. Patient is not a big water drinker. Counseled on the importance of hydration to maintain bp. Patient is aware that she needs to stay hydrated this summer working in her yard. Patient is cautious about yard work when bp is low and feels lightheaded.   Update  02/20/2020 - Patient is tolerating acebutolol daily right now. Since stopping fludrocortisone her swelling has improved. Patient's daughter is hopeful that the daily acebutolol will prevent irregular heartbeat and decrease "mini strokes". Patient see neurology early July.   Plan  Continue current medications    Hyperlipidemia   Lipid Panel     Component Value Date/Time   CHOL 143 02/16/2020 1135   TRIG 75 02/16/2020 1135   HDL 52 02/16/2020 1135   CHOLHDL 2.8 02/16/2020 1135   CHOLHDL 4.3 11/09/2018 0512   VLDL 11 11/09/2018 0512   LDLCALC 76 02/16/2020 1135   LABVLDL 15 02/16/2020 1135     The ASCVD Risk score (Goff DC Jr., et al., 2013) failed to calculate for the following reasons:   The 2013 ASCVD risk score is only valid for ages 61 to 54   The patient has a prior MI or stroke diagnosis   Patient has failed these meds in past: pravastatin Patient  is currently controlled on the following medications: atorvastatin 40 mg daily  We discussed:  diet and exercise extensively. Patient is active working in [employer]. She eats 3 meals each day but very limited appetite. Has recently started drinking an ensure each afternoon.   Update 02/20/2020 - Daughter is hoping that neuro visit in early July will help with some answers about potential mini strokes.Patient has given up mowing her yard but still takes care of weeding/planting flowers.   Plan  Continue current medications     and  Adjustment Disorder with Depressed Mood   Patient has failed these meds in past: n/a Patient is currently controlled on the following medications: Mirtazapine 15 mg daily  We discussed:  Patient has taken this for years. Her Dr. that was managing this retired. Dr. Henrene Pastor will now take over prescribing this. She has been stable on it for years. Takes it at bedtime each day. She has worsened depressions/"spells" in the winter time. She is enjoying her flowers, weeding and maintaining the beds outdoors now. This improves her mood dramatically.    Update 02/20/2020 - Patient's daughter reports that her mom is enjoying working in her yard on days she feels good.   Plan  Continue current medications   Health Maintenance   Patient is currently controlled on the following medications:   Vitamin d 1000 daily- general health Vitamin b-12 1000 daily - general health Selenex GSH - general health Advanced Hearing Supplement - general health/hearing Prevagen - general health/memory  We discussed:  Patient's taking supplements that she has found for supporting healthy living.   Plan  Continue current medications  Vaccines   Reviewed and discussed patient's vaccination history.    Immunization History  Administered Date(s) Administered  . PFIZER SARS-COV-2 Vaccination 09/13/2019, 10/03/2019  . Pneumococcal Polysaccharide-23 08/30/2018     Plan  Recommended patient receive flu vaccine in office.   Medication Management   Pt uses [facility] for all medications Patient fills and takes medication from a weekly pill box organizer.  Pt endorses good compliance  We discussed: Continuing to take medications as prescribed.   Plan  Continue current medications and supplements.   Follow up: 1 months

## 2020-03-08 ENCOUNTER — Encounter: Payer: Self-pay | Admitting: Neurology

## 2020-03-08 ENCOUNTER — Other Ambulatory Visit: Payer: Self-pay | Admitting: Legal Medicine

## 2020-03-08 ENCOUNTER — Ambulatory Visit: Payer: PPO | Admitting: Neurology

## 2020-03-08 ENCOUNTER — Other Ambulatory Visit: Payer: Self-pay

## 2020-03-08 VITALS — BP 122/76 | HR 60 | Ht 63.0 in | Wt 105.0 lb

## 2020-03-08 DIAGNOSIS — R404 Transient alteration of awareness: Secondary | ICD-10-CM

## 2020-03-08 DIAGNOSIS — R413 Other amnesia: Secondary | ICD-10-CM | POA: Diagnosis not present

## 2020-03-08 DIAGNOSIS — I639 Cerebral infarction, unspecified: Secondary | ICD-10-CM | POA: Diagnosis not present

## 2020-03-08 HISTORY — DX: Transient alteration of awareness: R40.4

## 2020-03-08 HISTORY — DX: Cerebral infarction, unspecified: I63.9

## 2020-03-08 MED ORDER — LEVETIRACETAM 250 MG PO TABS
250.0000 mg | ORAL_TABLET | Freq: Two times a day (BID) | ORAL | 11 refills | Status: DC
Start: 2020-03-08 — End: 2020-07-16

## 2020-03-08 NOTE — Progress Notes (Addendum)
HISTORICAL  [name] is a 86 year old female, seen in request by her primary care physician Dr. Lowell Singh, Katherine Mussel., for evaluation of mild confusion during her recent hospital admission, she is accompanied by her daughter [name] at today's visit on March 08, 2020  I reviewed and summarized the referring note. Past medical history of hyperlipidemia, Lipitor 40 mg daily Chronic atrial fibrillation, on Xarelto 10 mg daily Stroke in March 2020,   while she was off Xarelto for 90 days in preparation for left eyelid surgery, had a sudden onset speech difficulty, right upper extremity weakness on November 08, 2018, received TPA, I personally reviewed MRI showed small focus of acute stroke at the left postcentral gyri, also evidence of generalized atrophy, supratentorium small vessel disease  LDL 128, ejection fraction 55 to 60%, A1c 5.3, she was put back on Xarelto  following her eyelid surgery  She was noted to have gradual onset of memory loss over the past few years, there is mild, she does have multiple siblings suffer dementia in her elderly age, she still lives independently, drives short distance to church, grocery store,  On Jan 15, 2020, Sunday, she woke up not feeling well, still able to drive herself to church, at evening time, she self realized she was confused, could not find things at home, she has difficulty remembering her daughter's phone number, which she usually call 3-4 times a day, she walked across the road to herneighbor to ask for help, later 911 was called, she could remember the whole process, she was taken to [facility], no intervention was needed, she returned back to normal few hours later,  I reviewed hospital record, during emergency evaluation, blood pressure 136/86, temperature 98.5, SPO2 97% on room air,  Laboratory evaluations normal CBC, hemoglobin of 12, CMP, creatinine of 0.6, normal liver functional test, negative troponin, urinalysis was negative for  UTI, chest x-ray showed no significant abnormality,  She improved without any intervention,  Further evaluation showed normal B12 777, TSH 3.28, sodium was within normal limit 141, potassium 3.3,  Ultrasound of carotid artery showed no significant bilateral internal carotid artery stenosis  Personally reviewed MRI of the brain on Jan 16, 2020, evidence of left frontal cortical stroke, moderate generalized atrophy, moderate supratentorium small vessel disease, no acute abnormality,  On Jan 17, 2020, around 4 PM, while daughter was with her, she was noted suddenly slumped in her chair, lost muscle tone, she is fell out of her mouth, her eyes rolled back, there was no seizure activity noted, to grant her wishes no further medical intervention, her daughter did not call 911 or take her to the hospital, she was checked by her paramedic friends, "no pulse was found in her arms and in her lower extremities'. she was helped to her back, she was still very confused, weak by 8 PM, next morning on Jan 18, 2020, she was bounced back to her normal self, "better than ever seen her in the past few months",  She has quit driving since May 2021,  REVIEW OF SYSTEMS: Full 14 system review of systems performed and notable only for as above All other review of systems were negative.  ALLERGIES: Allergies  Allergen Reactions  . Codeine Nausea And Vomiting  . Penicillins Rash  . Sulfa Antibiotics Nausea And Vomiting    HOME MEDICATIONS: Current Outpatient Medications  Medication Sig Dispense Refill  . acebutolol (SECTRAL) 200 MG capsule Take 1 capsule (200 mg total) by mouth daily. 90 capsule  4  . Apoaequorin (PREVAGEN PO) Take 1 tablet by mouth daily.    [name]. atorvastatin (LIPITOR) 40 MG tablet Take 1 tablet (40 mg total) by mouth daily. 90 tablet 2  . Cholecalciferol (VITAMIN D3) 1000 units CAPS Take 1,000 Units by mouth daily.    [name]. docusate sodium (STOOL SOFTENER) 100 MG capsule Take 100 mg by mouth daily.     . mirtazapine (REMERON) 15 MG tablet Take 1 tablet (15 mg total) by mouth daily. 30 tablet 2  . NUTRITIONAL SUPPLEMENTS PO Take 1 tablet by mouth daily. Taking Advanced [facility]    . OVER THE COUNTER MEDICATION Take 1 tablet by mouth daily. Selenex GSH supplement    . vitamin B-12 (CYANOCOBALAMIN) 1000 MCG tablet Take 1,000 mcg by mouth daily.    [name]. XARELTO 10 MG TABS tablet TAKE 1 TABLET BY MOUTH EVERY DAY 30 tablet 6   No current facility-administered medications for this visit.    PAST MEDICAL HISTORY: Past Medical History:  Diagnosis Date  . Closed fracture of one rib of right side with routine healing 06/30/2017  . Confusion   . Current use of long term anticoagulation 07/21/2017  . Depression   . First degree atrioventricular block 06/26/2017  . Hammer toe 06/26/2017  . Hearing loss   . Hyperlipidemia 06/26/2017  . Memory loss   . Other specified cardiac arrhythmias 06/26/2017  . Pain in limb 06/26/2017  . Palpitations 06/26/2017  . Premature atrial complex 06/26/2017  . Premature ventricular contractions 06/26/2017  . PSVT (paroxysmal supraventricular tachycardia) (HCC) 11/10/2017  . Stroke (HCC)   . Traumatic pneumothorax 06/30/2017    PAST SURGICAL HISTORY: Past Surgical History:  Procedure Laterality Date  . ABDOMINAL HYSTERECTOMY    . SHOULDER SURGERY    . TONSILLECTOMY      FAMILY HISTORY: Family History  Problem Relation Age of Onset  . Bone cancer Mother   . Breast cancer Father   . Diabetes Father   . Heart disease Father   . Breast cancer Sister   . Dementia Brother   . Alzheimer's disease Sister     SOCIAL HISTORY: Social History   Socioeconomic History  . Marital status: Widowed    Spouse name: Not on file  . Number of children: 2  . Years of education: 5012  . Highest education level: High school graduate  Occupational History  . Occupation: Retired  Tobacco Use  . Smoking status: Never Smoker  . Smokeless tobacco: Never Used  Vaping  Use  . Vaping Use: Never used  Substance and Sexual Activity  . Alcohol use: No  . Drug use: No  . Sexual activity: Not Currently  Other Topics Concern  . Not on file  Social History Narrative   [name] is widowed. Her daughter, [name], is her POA and lives 12 miles away. She resides in her own home, alone for last 7 years. Since her CVA she has had someone with her most of the time. She has a hired caregiver or a family member stays most of the day. She feel safe in her home and in her neighborhood.   Right-handed.   0.5 cups caffeine per day.   Social Determinants of Health   Financial Resource Strain:   . Difficulty of Paying Living Expenses:   Food Insecurity:   . Worried About Programme researcher, broadcasting/film/videounning Out of Food in the Last Year:   . Baristaan Out of Food in the Last Year:   Transportation Needs:   .  Lack of Transportation (Medical):   [name] Lack of Transportation (Non-Medical):   Physical Activity:   . Days of Exercise per Week:   . Minutes of Exercise per Session:   Stress:   . Feeling of Stress :   Social Connections:   . Frequency of Communication with Friends and Family:   . Frequency of Social Gatherings with Friends and Family:   . Attends Religious Services:   . Active Member of Clubs or Organizations:   . Attends Banker Meetings:   [name] Marital Status:   Intimate Partner Violence:   . Fear of Current or Ex-Partner:   . Emotionally Abused:   [name] Physically Abused:   . Sexually Abused:      PHYSICAL EXAM   Vitals:   03/08/20 0749  BP: 122/76  Pulse: 60  Weight: 105 lb (47.6 kg)  Height: 5\' 3"  (1.6 m)   Not recorded     Body mass index is 18.6 kg/m.  PHYSICAL EXAMNIATION:  Gen: NAD, conversant, well nourised, well groomed                     Cardiovascular: Regular rate rhythm, no peripheral edema, warm, nontender. Eyes: Conjunctivae clear without exudates or hemorrhage Neck: Supple, no carotid bruits. Pulmonary: Clear to auscultation bilaterally     NEUROLOGICAL EXAM: MMSE - Mini Mental State Exam 03/08/2020  Orientation to time 5  Orientation to Place 5  Registration 3  Attention/ Calculation 5  Recall 3  Language- name 2 objects 2  Language- repeat 1  Language- follow 3 step command 3  Language- read & follow direction 1  Write a sentence 1  Copy design 1  Total score 30  animal naming 10. CRANIAL NERVES: CN II: Visual fields are full to confrontation. Pupils are round equal and briskly reactive to light. CN III, IV, VI: extraocular movement are normal. No ptosis. CN V: Facial sensation is intact to light touch CN VII: Face is symmetric with normal eye closure  CN VIII: Hearing is normal to causal conversation. CN IX, X: Phonation is normal. CN XI: Head turning and shoulder shrug are intact  MOTOR: There is no pronator drift of out-stretched arms. Muscle bulk and tone are normal. Muscle strength is normal.  REFLEXES: Reflexes are 1 and symmetric at the biceps, triceps, knees, and ankles. Plantar responses are flexor.  SENSORY: Intact to light touch, pinprick and vibratory sensation are intact in fingers and toes.  COORDINATION: There is no trunk or limb dysmetria noted.  GAIT/STANCE: She needs to push on chair arm to get up from seated position, mild kyphosis, also mild unsteadiness   DIAGNOSTIC DATA (LABS, IMAGING, TESTING) - I reviewed patient records, labs, notes, testing and imaging myself where available.   ASSESSMENT AND PLAN  WANDALENE ABRAMS is a 86 y.o. female   Mild cognitive impairment History of left frontal area cortical stroke, in March 2020, after she stopped Xarelto in preparation for surgery Chronic atrial fibrillation, on Xarelto, Probable complex partial seizure on Jan 17, 2020  She had witnessed sudden onset body went limp, eyes rolled back, pulseless, there was no seizure-like activity,  Differentiation diagnoses also include cardiac arrhythmia  EEG, referral to her cardiologist for  cardiac monitoring  Empirically treat with Keppra 250 twice a day  No driving until episode free for 6 months  Return to clinic in 4 months   Jan 19, 2020, M.D. Ph.D.  [facility] [address] [place],  [place] [address] Ph: [phone] Fax: [phone]  CC:  Zigmund Daniel., MD Email Jul 10 2020   My mother, [name], has an appointment on Nov. 15 for a follow up related to post-stroke seizures. Two issues have come up recently that may be related. I want you to be aware of these concerns before she arrives. She is very fragile right now. 1. On Oct. 31 she suddenly lost hearing. She had been doing well with her new hearing aids and was participating in conversations better than in years. That day she seemed to lose all progress. After a week or so she improved. 2. Her memory has been slowly deteriorating over the past few years. That loss has accelerated recently to the point that I am concerned about her ability to live alone. The loss encompasses short and long term memory. Please address these issues as well as her seizure history. Thank you.  [employer] POA

## 2020-03-11 DIAGNOSIS — N3001 Acute cystitis with hematuria: Secondary | ICD-10-CM | POA: Diagnosis not present

## 2020-03-11 DIAGNOSIS — M5441 Lumbago with sciatica, right side: Secondary | ICD-10-CM | POA: Diagnosis not present

## 2020-03-11 DIAGNOSIS — R319 Hematuria, unspecified: Secondary | ICD-10-CM | POA: Diagnosis not present

## 2020-03-12 ENCOUNTER — Ambulatory Visit: Payer: PPO | Admitting: Neurology

## 2020-03-12 ENCOUNTER — Other Ambulatory Visit: Payer: Self-pay

## 2020-03-12 DIAGNOSIS — I639 Cerebral infarction, unspecified: Secondary | ICD-10-CM

## 2020-03-12 DIAGNOSIS — R404 Transient alteration of awareness: Secondary | ICD-10-CM

## 2020-03-12 DIAGNOSIS — R413 Other amnesia: Secondary | ICD-10-CM

## 2020-03-15 ENCOUNTER — Other Ambulatory Visit: Payer: Self-pay | Admitting: Legal Medicine

## 2020-03-15 NOTE — Procedures (Signed)
   HISTORY: 86 year old female with history of mild confusion  TECHNIQUE:  This is a routine 16 channel EEG recording with one channel devoted to a limited EKG recording.  It was performed during wakefulness, drowsiness and asleep.  Hyperventilation and photic stimulation were performed as activating procedures.  There are minimum muscle and movement artifact noted.  Upon maximum arousal, posterior dominant waking rhythm consistent of rhythmic alpha range activity,. Activities are symmetric over the bilateral posterior derivations and attenuated with eye opening.  Hyperventilation produced mild/moderate buildup with higher amplitude and the slower activities noted.  Photic stimulation did not alter the tracing.  During EEG recording, patient developed drowsiness and entered sleep, sleep EEG demonstrated architecture, there were frontal centrally dominant vertex waves and symmetric sleep spindles noted.  During EEG recording, there was no epileptiform discharge noted.  EKG demonstrate sinus rhythm, with heart rate of 80 bpm  CONCLUSION: This is a  normal awake and sleep EEG.  There is no electrodiagnostic evidence of epileptiform discharge.  Levert Feinstein, M.D. Ph.D.  [facility] [address] [place], [place] [address] Phone: [phone] Fax:      [phone]

## 2020-03-19 ENCOUNTER — Encounter: Payer: Self-pay | Admitting: Legal Medicine

## 2020-03-20 ENCOUNTER — Telehealth: Payer: Self-pay | Admitting: Neurology

## 2020-03-20 DIAGNOSIS — S41111A Laceration without foreign body of right upper arm, initial encounter: Secondary | ICD-10-CM | POA: Diagnosis not present

## 2020-03-20 NOTE — Telephone Encounter (Signed)
I was able to talk with her Daughter [name], explained that EEG was normal, does not rule out the possibility of seizure  Daughter also reported that patient continues to have slow decline memory loss over the past few years, emotional, difficult to reason with  She has quit driving since last visit

## 2020-03-23 ENCOUNTER — Ambulatory Visit: Payer: PPO

## 2020-03-23 DIAGNOSIS — E7800 Pure hypercholesterolemia, unspecified: Secondary | ICD-10-CM

## 2020-03-23 DIAGNOSIS — I10 Essential (primary) hypertension: Secondary | ICD-10-CM

## 2020-03-23 NOTE — Patient Instructions (Signed)
Visit Information  Goals Addressed            This Visit's Progress   . Pharmacy Care Plan       CARE PLAN ENTRY (see longitudinal plan of care for additional care plan information)  Current Barriers:  . Uncontrolled hyperlipidemia, complicated by age, htn . Current antihyperlipidemic regimen: atorvastatin . Previous antihyperlipidemic medications tried pravastatin . Most recent lipid panel:     Component Value Date/Time   CHOL 143 02/16/2020 1135   TRIG 75 02/16/2020 1135   HDL 52 02/16/2020 1135   CHOLHDL 2.8 02/16/2020 1135   CHOLHDL 4.3 11/09/2018 0512   VLDL 11 11/09/2018 0512   LDLCALC 76 02/16/2020 1135 .   [name] ASCVD risk enhancing conditions: age >25, DM, HTN, CKD, CHF, current smoker . 10-year ASCVD risk score: Hx of Stroke  Pharmacist Clinical Goal(s):  [name] Over the next 60 days, patient will work with PharmD and providers towards optimized antihyperlipidemic therapy  Interventions: . Comprehensive medication review performed; medication list updated in electronic medical record.  [name] care team collaboration (see longitudinal plan of care) . Ensure safety, efficacy, and affordability of medications . Discussed importance of preventing future stroke with atorvastatin. Patient likely had seizure activity related to stroke from last year following -up with neurology November.   Patient Self Care Activities:  . Patient will focus on medication adherence by continuing to use daily medication planner.   Please see past updates related to this goal by clicking on the "Past Updates" button in the selected goal      . [place] (see longitudinal plan of care for additional care plan information)  Current Barriers:  . Hypertension/Afib, complicated by hypotension . Current antihypertensive regimen: acebutolol 200 mg daily . Last practice recorded BP readings:  BP Readings from Last 3 Encounters:  03/08/20 122/76    02/16/20 130/70  01/24/20 (!) 90/52 .   [name] Current home BP readings: Waiting on monitor to be approved . Most recent eGFR/CrCl: No results found for: EGFR  No components found for: CRCL  Pharmacist Clinical Goal(s):  [name] Over the next 60 days, patient will work with PharmD and providers to optimize blood pressure readings.   Interventions: . Inter-disciplinary care team collaboration (see longitudinal plan of care) . Comprehensive medication review performed; medication list updated in the electronic medical record.  . Ensure safety, efficacy, and affordability of medications . Discussed patient tolerating acebutolol well once daily.    Patient Self Care Activities:  . Patient will continue to check BP weekly once meter available , document, and provide at future appointments . Patient will focus on medication adherence by continuing to use daily medication planner.  Please see past updates related to this goal by clicking on the "Past Updates" button in the selected goal         The patient verbalized understanding of instructions provided today and declined a print copy of patient instruction materials.   Telephone follow up appointment with pharmacy team member scheduled for: 09/2020  Sherre Poot, PharmD, [facility] Clinical Pharmacist [facility] [phone] (office) [phone] (mobile)   DASH Eating Plan DASH stands for "Dietary Approaches to Stop Hypertension." The DASH eating plan is a healthy eating plan that has been shown to reduce high blood pressure (hypertension). It may also reduce your risk for type 2 diabetes, heart disease, and stroke. The DASH eating plan may also help with weight loss.  What are tips for following this plan?  General guidelines  Avoid eating more than 2,300 mg (milligrams) of salt (sodium) a day. If you have hypertension, you may need to reduce your sodium intake to 1,500 mg a day.  Limit alcohol intake to no more than 1 drink  a day for nonpregnant women and 2 drinks a day for men. One drink equals 12 oz of beer, 5 oz of wine, or 1 oz of hard liquor.  Work with your health care provider to maintain a healthy body weight or to lose weight. Ask what an ideal weight is for you.  Get at least 30 minutes of exercise that causes your heart to beat faster (aerobic exercise) most days of the week. Activities may include walking, swimming, or biking.  Work with your health care provider or diet and nutrition specialist (dietitian) to adjust your eating plan to your individual calorie needs. Reading food labels   Check food labels for the amount of sodium per serving. Choose foods with less than 5 percent of the Daily Value of sodium. Generally, foods with less than 300 mg of sodium per serving fit into this eating plan.  To find whole grains, look for the word "whole" as the first word in the ingredient list. Shopping  Buy products labeled as "low-sodium" or "no salt added."  Buy fresh foods. Avoid canned foods and premade or frozen meals. Cooking  Avoid adding salt when cooking. Use salt-free seasonings or herbs instead of table salt or sea salt. Check with your health care provider or pharmacist before using salt substitutes.  Do not fry foods. Cook foods using healthy methods such as baking, boiling, grilling, and broiling instead.  Cook with heart-healthy oils, such as olive, canola, soybean, or sunflower oil. Meal planning  Eat a balanced diet that includes: ? 5 or more servings of fruits and vegetables each day. At each meal, try to fill half of your plate with fruits and vegetables. ? Up to 6-8 servings of whole grains each day. ? Less than 6 oz of lean meat, poultry, or fish each day. A 3-oz serving of meat is about the same size as a deck of cards. One egg equals 1 oz. ? 2 servings of low-fat dairy each day. ? A serving of nuts, seeds, or beans 5 times each week. ? Heart-healthy fats. Healthy fats called  Omega-3 fatty acids are found in foods such as flaxseeds and coldwater fish, like sardines, salmon, and mackerel.  Limit how much you eat of the following: ? Canned or prepackaged foods. ? Food that is high in trans fat, such as fried foods. ? Food that is high in saturated fat, such as fatty meat. ? Sweets, desserts, sugary drinks, and other foods with added sugar. ? Full-fat dairy products.  Do not salt foods before eating.  Try to eat at least 2 vegetarian meals each week.  Eat more home-cooked food and less restaurant, buffet, and fast food.  When eating at a restaurant, ask that your food be prepared with less salt or no salt, if possible. What foods are recommended? The items listed may not be a complete list. Talk with your dietitian about what dietary choices are best for you. Grains Whole-grain or whole-wheat bread. Whole-grain or whole-wheat pasta. Cing  rice. Modena Morrow. Bulgur. Whole-grain and low-sodium cereals. Pita bread. Low-fat, low-sodium crackers. Whole-wheat flour tortillas. Vegetables Fresh or frozen vegetables (raw, steamed, roasted, or grilled). Low-sodium or reduced-sodium tomato and vegetable juice. Low-sodium or reduced-sodium  tomato sauce and tomato paste. Low-sodium or reduced-sodium canned vegetables. Fruits All fresh, dried, or frozen fruit. Canned fruit in natural juice (without added sugar). Meat and other protein foods Skinless chicken or [place]. Ground chicken or [place]. Pork with fat trimmed off. Fish and seafood. Egg whites. Dried beans, peas, or lentils. Unsalted nuts, nut butters, and seeds. Unsalted canned beans. Lean cuts of beef with fat trimmed off. Low-sodium, lean deli meat. Dairy Low-fat (1%) or fat-free (skim) milk. Fat-free, low-fat, or reduced-fat cheeses. Nonfat, low-sodium ricotta or cottage cheese. Low-fat or nonfat yogurt. Low-fat, low-sodium cheese. Fats and oils Soft margarine without trans fats. Vegetable oil. Low-fat,  reduced-fat, or light mayonnaise and salad dressings (reduced-sodium). Canola, safflower, olive, soybean, and sunflower oils. Avocado. Seasoning and other foods Herbs. Spices. Seasoning mixes without salt. Unsalted popcorn and pretzels. Fat-free sweets. What foods are not recommended? The items listed may not be a complete list. Talk with your dietitian about what dietary choices are best for you. Grains Baked goods made with fat, such as croissants, muffins, or some breads. Dry pasta or rice meal packs. Vegetables Creamed or fried vegetables. Vegetables in a cheese sauce. Regular canned vegetables (not low-sodium or reduced-sodium). Regular canned tomato sauce and paste (not low-sodium or reduced-sodium). Regular tomato and vegetable juice (not low-sodium or reduced-sodium). Angie Fava. Olives. Fruits Canned fruit in a light or heavy syrup. Fried fruit. Fruit in cream or butter sauce. Meat and other protein foods Fatty cuts of meat. Ribs. Fried meat. Berniece Salines. Sausage. Bologna and other processed lunch meats. Salami. Fatback. Hotdogs. Bratwurst. Salted nuts and seeds. Canned beans with added salt. Canned or smoked fish. Whole eggs or egg yolks. Chicken or [place] with skin. Dairy Whole or 2% milk, cream, and half-and-half. Whole or full-fat cream cheese. Whole-fat or sweetened yogurt. Full-fat cheese. Nondairy creamers. Whipped toppings. Processed cheese and cheese spreads. Fats and oils Butter. Stick margarine. Lard. Shortening. Ghee. Bacon fat. Tropical oils, such as coconut, palm kernel, or palm oil. Seasoning and other foods Salted popcorn and pretzels. Onion salt, garlic salt, seasoned salt, table salt, and sea salt. Worcestershire sauce. Tartar sauce. Barbecue sauce. Teriyaki sauce. Soy sauce, including reduced-sodium. Steak sauce. Canned and packaged gravies. Fish sauce. Oyster sauce. Cocktail sauce. Horseradish that you find on the shelf. Ketchup. Mustard. Meat flavorings and tenderizers.  Bouillon cubes. Hot sauce and Tabasco sauce. Premade or packaged marinades. Premade or packaged taco seasonings. Relishes. Regular salad dressings. Where to find more information:  National Heart, Lung, and [place]: https://wilson-eaton.com/  American Heart Association: www.heart.org Summary  The DASH eating plan is a healthy eating plan that has been shown to reduce high blood pressure (hypertension). It may also reduce your risk for type 2 diabetes, heart disease, and stroke.  With the DASH eating plan, you should limit salt (sodium) intake to 2,300 mg a day. If you have hypertension, you may need to reduce your sodium intake to 1,500 mg a day.  When on the DASH eating plan, aim to eat more fresh fruits and vegetables, whole grains, lean proteins, low-fat dairy, and heart-healthy fats.  Work with your health care provider or diet and nutrition specialist (dietitian) to adjust your eating plan to your individual calorie needs. This information is not intended to replace advice given to you by your health care provider. Make sure you discuss any questions you have with your health care provider. Document Revised: 07/31/2017 Document Reviewed: 08/11/2016 Elsevier Patient Education  2020 [employer].

## 2020-03-23 NOTE — Chronic Care Management (AMB) (Signed)
Chronic Care Management Pharmacy  Name: BETHANN QUALLEY  MRN: [id number] DOB: [birth year]  Chief Complaint/ HPI  [name],  86 y.o. , female presents for their Follow-Up CCM visit with the clinical pharmacist via telephone due to COVID-19 Pandemic.  PCP : [name], MD  Their chronic conditions include: atrial fibrillation, HTN, CKD II, HLD, Adjustment disorder with depressed mood  Office Visits: 02/16/2020 - waiting for [facility] appt.  01/24/2020 - recent lacunar stroke. Atorvastatin as opposed to pravastatin.  01/12/2020 - stopped fludrocortisone.  12/06/2019 - Acute cystitis - macrobid ordered. Changed acebutolol to QOD due to low bp. Added boost to improve nutrition.  11/29/2019 - No anemia, glucose 106, kidney tests lower. Headache for a few days and fatigue. Ordered CT scan to rule out bleed.  10/24/2019 - Ears cleaned out in office.  10/17/2019 - No medication changes. Stable on current regimen.    Consult Visit: 03/08/2020 - Neurology - Differentiation diagnoses also include cardiac arrhythmia EEG, referral to her cardiologist for cardiac monitoring Empirically treat with Keppra 250 twice a day No driving until episode free for 6 months 01/23/2020 - ENT for hearing loss.  10/21/2019 - Dr. [name] - EKG ordered.The ekg ordered today demonstrates sinus rhythm first-degree heart block otherwise normal. Afib stable continue beta blocker and anticoagulant. Stable hyperlipidemia.   Medications: Outpatient Encounter Medications as of 03/23/2020  Medication Sig  . acebutolol (SECTRAL) 200 MG capsule Take 1 capsule (200 mg total) by mouth daily.  [name] Apoaequorin (PREVAGEN PO) Take 1 tablet by mouth daily.  [name] atorvastatin (LIPITOR) 40 MG tablet Take 1 tablet (40 mg total) by mouth daily.  . Cholecalciferol (VITAMIN D3) 1000 units CAPS Take 1,000 Units by mouth daily.  [name] docusate sodium (STOOL SOFTENER) 100 MG capsule Take 100 mg by mouth daily.  [name]  levETIRAcetam (KEPPRA) 250 MG tablet Take 1 tablet (250 mg total) by mouth 2 (two) times daily.  [name] levETIRAcetam (KEPPRA) 250 MG tablet Take 250 mg by mouth 2 (two) times daily.  . mirtazapine (REMERON) 15 MG tablet TAKE 1 TABLET(15 MG) BY MOUTH DAILY  . NUTRITIONAL SUPPLEMENTS PO Take 1 tablet by mouth daily. Taking Advanced Hearing  . OVER THE COUNTER MEDICATION Take 1 tablet by mouth daily. Selenex GSH supplement  . vitamin B-12 (CYANOCOBALAMIN) 1000 MCG tablet Take 1,000 mcg by mouth daily.  Alveda Reasons 10 MG TABS tablet TAKE 1 TABLET BY MOUTH EVERY DAY   No facility-administered encounter medications on file as of 03/23/2020.   Allergies  Allergen Reactions  . Codeine Nausea And Vomiting  . Penicillins Rash  . Sulfa Antibiotics Nausea And Vomiting    SDOH Screenings   Alcohol Screen:   . Last Alcohol Screening Score (AUDIT):   Depression (PHQ2-9):   . PHQ-2 Score:   Financial Resource Strain:   . Difficulty of Paying Living Expenses:   Food Insecurity:   . Worried About Charity fundraiser in the Last Year:   . [employer] of Food in the Last Year:   Housing: [place]   . Last Housing Risk Score: 0  Physical Activity:   . Days of Exercise per Week:   . Minutes of Exercise per Session:   Social Connections:   . Frequency of Communication with Friends and Family:   . Frequency of Social Gatherings with Friends and Family:   . Attends Religious Services:   . Active Member of Clubs or Organizations:   . Attends Archivist  Meetings:   [name] Marital Status:   Stress:   . Feeling of Stress :   Tobacco Use: Low Risk   . Smoking Tobacco Use: Never Smoker  . Smokeless Tobacco Use: Never Used  Transportation Needs:   . Film/video editor (Medical):   [name] Lack of Transportation (Non-Medical):      Current Diagnosis/Assessment:  Goals Addressed            This Visit's Progress   . Pharmacy Care Plan       CARE PLAN ENTRY (see longitudinal plan of care for  additional care plan information)  Current Barriers:  . Hypertension/Afib, complicated by hypotension . Current antihypertensive regimen: acebutolol 200 mg daily . Last practice recorded BP readings:  BP Readings from Last 3 Encounters:  03/08/20 122/76  02/16/20 130/70  01/24/20 (!) 90/52 .   [name] Current home BP readings: Waiting on monitor to be approved . Most recent eGFR/CrCl: No results found for: EGFR  No components found for: CRCL  Pharmacist Clinical Goal(s):  [name] Over the next 60 days, patient will work with PharmD and providers to optimize blood pressure readings.   Interventions: . Inter-disciplinary care team collaboration (see longitudinal plan of care) . Comprehensive medication review performed; medication list updated in the electronic medical record.  . Ensure safety, efficacy, and affordability of medications . Discussed patient tolerating acebutolol well once daily.    Patient Self Care Activities:  . Patient will continue to check BP weekly once meter available , document, and provide at future appointments . Patient will focus on medication adherence by continuing to use daily medication planner.  Please see past updates related to this goal by clicking on the "Past Updates" button in the selected goal         AFIB/HTN    Patient is currently rate controlled.  Patient has failed these meds in past: fludrocortisone Patient is currently controlled on the following medications: acebutolol 200 mg daily, xarelto 10 mg daily  Office blood pressures are  BP Readings from Last 3 Encounters:  12/06/19 100/60  11/29/19 100/60  10/21/19 108/62   Patient checks BP at home: working to get a bp monitor Patient home BP readings are ranging: has had low bp lately. Getting a monitor to use at home.   We discussed:  diet and exercise extensively. Patient feels weak and low energy when bp is low. Patient is not a big water drinker. Counseled on the importance of  hydration to maintain bp. Patient is aware that she needs to stay hydrated this summer working in her yard. Patient is cautious about yard work when bp is low and feels lightheaded.   Update 02/20/2020 - Patient is tolerating acebutolol daily right now. Since stopping fludrocortisone her swelling has improved. Patient's daughter is hopeful that the daily acebutolol will prevent irregular heartbeat and decrease "mini strokes". Patient see neurology early July.  Update 03/23/2020: BP was elevated this week at urgent care after cutting her arm and needing stitches. Repair with stitches was not possible due to skin rolling back. May need wound management if not healing well.  Otherwise patient's bp seems well controlled.  Taking acebutolol daily again.   Plan  Continue current medications    Hyperlipidemia   Lipid Panel     Component Value Date/Time   CHOL 143 02/16/2020 1135   TRIG 75 02/16/2020 1135   HDL 52 02/16/2020 1135   CHOLHDL 2.8 02/16/2020 1135   CHOLHDL 4.3 11/09/2018 0512  VLDL 11 11/09/2018 0512   LDLCALC 76 02/16/2020 1135   LABVLDL 15 02/16/2020 1135     The ASCVD Risk score Mikey Bussing DC Jr., et al., 2013) failed to calculate for the following reasons:   The 2013 ASCVD risk score is only valid for ages 61 to 85   The patient has a prior MI or stroke diagnosis   Patient has failed these meds in past: pravastatin Patient is currently controlled on the following medications: atorvastatin 40 mg daily  We discussed:  diet and exercise extensively. Patient is active working in [employer]. She eats 3 meals each day but very limited appetite. Has recently started drinking an ensure each afternoon.   Update 02/20/2020 - Daughter is hoping that neuro visit in early July will help with some answers about potential mini strokes.Patient has given up mowing her yard but still takes care of weeding/planting flowers.   Update 03/23/2020 - Patient taking medication well. Following up  with Dr. [name] in August.   Plan  Continue current medications     and  Adjustment Disorder with Depressed Mood   Patient has failed these meds in past: n/a Patient is currently controlled on the following medications: Mirtazapine 15 mg daily  We discussed:  Patient has taken this for years. Her Dr. that was managing this retired. Dr. Henrene Pastor will now take over prescribing this. She has been stable on it for years. Takes it at bedtime each day. She has worsened depressions/"spells" in the winter time. She is enjoying her flowers, weeding and maintaining the beds outdoors now. This improves her mood dramatically.    Update 02/20/2020 - Patient's daughter reports that her mom is enjoying working in her yard on days she feels good.   Update 03/23/2020 - Driving being taken away has made her unhappy but she is adjusting. Daughter is working to be available for errands twice weekly.   Plan  Continue current medications   Seizures   Patient has failed these meds in past: n/a Patient is currently controlled on the following medications:  [name] Keppra 250 mg bid  We discussed:  Patient adjusting to taking medication twice daily. Driving privileges revoked for now due to risk. Daughter comes down twice a week to help her not feel limited by not driving. Patient follows-up with neurology in November.   Plan  Continue current medications   Health Maintenance   Patient is currently controlled on the following medications:   Vitamin d 1000 daily- general health Vitamin b-12 1000 daily - general health Selenex GSH - general health Advanced Hearing Supplement - general health/hearing Prevagen - general health/memory  We discussed:  Patient's taking supplements that she has found for supporting healthy living.   Plan  Continue current medications  Vaccines   Reviewed and discussed patient's vaccination history.    Immunization History  Administered Date(s) Administered  . PFIZER  SARS-COV-2 Vaccination 09/13/2019, 10/03/2019  . Pneumococcal Polysaccharide-23 08/30/2018    Plan  Recommended patient receive flu vaccine in office.   Medication Management   Pt uses [facility] for all medications Patient fills and takes medication from a weekly pill box organizer.  Pt endorses good compliance  We discussed: Continuing to take medications as prescribed.   Plan  Continue current medications and supplements.   Follow up: 6  months

## 2020-04-09 ENCOUNTER — Telehealth: Payer: Self-pay

## 2020-04-09 NOTE — Telephone Encounter (Signed)
Patients daughter called with concerns of patient feeling grogy and slurring speech and having a BP of 85/55, Per Dr. Sedalia Muta she was advised to call an ambulance

## 2020-04-20 DIAGNOSIS — H9190 Unspecified hearing loss, unspecified ear: Secondary | ICD-10-CM | POA: Insufficient documentation

## 2020-04-20 DIAGNOSIS — F329 Major depressive disorder, single episode, unspecified: Secondary | ICD-10-CM | POA: Insufficient documentation

## 2020-04-20 DIAGNOSIS — I639 Cerebral infarction, unspecified: Secondary | ICD-10-CM | POA: Insufficient documentation

## 2020-04-20 DIAGNOSIS — R410 Disorientation, unspecified: Secondary | ICD-10-CM | POA: Insufficient documentation

## 2020-04-23 NOTE — Progress Notes (Signed)
Cardiology Office Note:    Date:  04/24/2020   ID:  [name], DOB [birth year], MRN [id number]  PCP:  Abigail Miyamoto, MD  Cardiologist:  Norman Herrlich, MD    Referring MD: Levert Feinstein, MD    ASSESSMENT:    1. Paroxysmal atrial fibrillation (HCC)   2. Current use of long term anticoagulation   3. Pure hypercholesterolemia    PLAN:    In order of problems listed above:  1. Although her symptoms are not specific she is at risk for bradycardia of paroxysmal atrial fibrillation and will utilize a 2-week ZIO monitor to assess her rhythm.  Continue acebutolol and transition to Eliquis reduced dose with age and body mass. 2. Continue lipid-lowering therapy with her statin lipids are ideal   Next appointment: 6 weeks   Medication Adjustments/Labs and Tests Ordered: Current medicines are reviewed at length with the patient today.  Concerns regarding medicines are outlined above.  Orders Placed This Encounter  Procedures  . LONG TERM MONITOR (3-14 DAYS)  . EKG 12-Lead   Meds ordered this encounter  Medications  . apixaban (ELIQUIS) 2.5 MG TABS tablet    Sig: Take 1 tablet (2.5 mg total) by mouth 2 (two) times daily.    Dispense:  60 tablet    Refill:  3    Chief Complaint  Patient presents with  . Follow-up  . Atrial Fibrillation  . Anticoagulation    History of Present Illness:    [name] is a 86 y.o. female with a hx of atrial fibrillation on anticoagulation, PAT, DLD ,COPD, CVA (11/08/18) L middle cerebral artery after Xarelto withdrawn for eye surgery last seen 11/16/18. Echo 11/09/18 normal LV size and function, diastolic function indeterminite due to atrial fibrillation, mod LA and RA enlargement, mod TR. [facility] chest ([facility]) shows extensive mural thrombus and atherosclerosis in the distal arch and descending thoracic aorta.She was seen 04/18/2019 and appeared to have made a full recovery from her stroke.  She was last seen  10/21/2019.  Compliance with diet, lifestyle and medications: Yes  She recently was admitted to the hospital with having what was interpreted as seizure disorder she has had recurrent episodes and her daughter said neurology was concerned it may be related to her heart rhythm.  She is not having palpitation or syncope no chest pain or shortness of breath.  She takes an unusual dose of Xarelto and I feel much more comfortable using Eliquis with the reduced dose of 2-1/2 mg with her age and body mass and will transition at her next dose of anticoagulant.  She tolerates her statin without muscle pain or weakness.  Recent labs 02/16/2020 showed lipids to be at target on her statin creatinine is 0.7 GFR is normal.  I was unaware that she had been at [facility] 05/17 to 01/17/2020 and was able to obtain records.  She was admitted for changes in her level of consciousness and behavior and was felt to have had metabolic encephalopathy initially MRI showed no findings of stroke.  She had evaluation including an echocardiogram showed low normal ejection fraction atrial enlargement and mild to moderate mitral and tricuspid regurgitation.  Chest x-ray showed scarring at the left lung base laboratory studies showed a normal creatinine 0.60 potassium 4.2 troponin was mildly elevated but the trend was flat and not felt to have acute coronary syndrome admission potassium was depleted at 3.3 hemoglobin 12.0.  Carotid duplex showed minimal amounts of bilateral atherosclerotic plaque  right greater than left getting these records.  Her EKG independently reviewed by me from 01/15/2020 shows sinus rhythm with APCs and 1 PVC.  She had no bradycardia or pauses noted during the hospitalization.  Past Medical History:  Diagnosis Date  . Closed fracture of one rib of right side with routine healing 06/30/2017  . Confusion   . COPD (chronic obstructive pulmonary disease) (HCC) 11/16/2017  . Current use of long term  anticoagulation 07/21/2017  . Depression   . First degree atrioventricular block 06/26/2017  . Hammer toe 06/26/2017  . Hearing loss   . Hyperlipidemia 06/26/2017  . Memory loss   . Other specified cardiac arrhythmias 06/26/2017  . Pain in limb 06/26/2017  . Palpitations 06/26/2017  . Premature atrial complex 06/26/2017  . Premature ventricular contractions 06/26/2017  . PSVT (paroxysmal supraventricular tachycardia) (HCC) 11/10/2017  . Stroke (HCC)   . Traumatic pneumothorax 06/30/2017    Past Surgical History:  Procedure Laterality Date  . ABDOMINAL HYSTERECTOMY    . SHOULDER SURGERY    . TONSILLECTOMY      Current Medications: Current Meds  Medication Sig  . acebutolol (SECTRAL) 200 MG capsule Take 1 capsule (200 mg total) by mouth daily.  [name] Apoaequorin (PREVAGEN PO) Take 1 tablet by mouth daily.  [name] atorvastatin (LIPITOR) 40 MG tablet Take 1 tablet (40 mg total) by mouth daily.  . Cholecalciferol (VITAMIN D3) 1000 units CAPS Take 1,000 Units by mouth daily.  [name] docusate sodium (STOOL SOFTENER) 100 MG capsule Take 100 mg by mouth daily.  [name] levETIRAcetam (KEPPRA) 250 MG tablet Take 1 tablet (250 mg total) by mouth 2 (two) times daily.  . mirtazapine (REMERON) 15 MG tablet TAKE 1 TABLET(15 MG) BY MOUTH DAILY  . NUTRITIONAL SUPPLEMENTS PO Take 1 tablet by mouth daily. Taking Advanced Hearing  . OVER THE COUNTER MEDICATION Take 1 tablet by mouth daily. Selenex GSH supplement  . vitamin B-12 (CYANOCOBALAMIN) 1000 MCG tablet Take 1,000 mcg by mouth daily.  . [DISCONTINUED] XARELTO 10 MG TABS tablet TAKE 1 TABLET BY MOUTH EVERY DAY     Allergies:   Codeine, Penicillins, and Sulfa antibiotics   Social History   Socioeconomic History  . Marital status: Widowed    Spouse name: Not on file  . Number of children: 2  . Years of education: 1  . Highest education level: High school graduate  Occupational History  . Occupation: Retired  Tobacco Use  . Smoking status: Never  Smoker  . Smokeless tobacco: Never Used  Vaping Use  . Vaping Use: Never used  Substance and Sexual Activity  . Alcohol use: No  . Drug use: No  . Sexual activity: Not Currently  Other Topics Concern  . Not on file  Social History Narrative   [name] is widowed. Her daughter, [name], is her POA and lives 12 miles away. She resides in her own home, alone for last 7 years. Since her CVA she has had someone with her most of the time. She has a hired caregiver or a family member stays most of the day. She feel safe in her home and in her neighborhood.   Right-handed.   0.5 cups caffeine per day.   Social Determinants of Health   Financial Resource Strain:   . Difficulty of Paying Living Expenses: Not on file  Food Insecurity:   . Worried About Programme researcher, broadcasting/film/video in the Last Year: Not on file  . Ran Out of Food in the  Last Year: Not on file  Transportation Needs:   . Lack of Transportation (Medical): Not on file  . Lack of Transportation (Non-Medical): Not on file  Physical Activity:   . Days of Exercise per Week: Not on file  . Minutes of Exercise per Session: Not on file  Stress:   . Feeling of Stress : Not on file  Social Connections:   . Frequency of Communication with Friends and Family: Not on file  . Frequency of Social Gatherings with Friends and Family: Not on file  . Attends Religious Services: Not on file  . Active Member of Clubs or Organizations: Not on file  . Attends Banker Meetings: Not on file  . Marital Status: Not on file     Family History: The patient's family history includes Alzheimer's disease in her sister; Bone cancer in her mother; Breast cancer in her father and sister; Dementia in her brother; Diabetes in her father; Heart disease in her father. ROS:   Please see the history of present illness.    All other systems reviewed and are negative.  EKGs/Labs/Other Studies Reviewed:    The following studies were reviewed  today:    Recent Labs: 02/16/2020: ALT 15; BUN 17; Creatinine, Ser 0.73; Hemoglobin 12.4; Platelets 212; Potassium 4.9; Sodium 139  Recent Lipid Panel    Component Value Date/Time   CHOL 143 02/16/2020 1135   TRIG 75 02/16/2020 1135   HDL 52 02/16/2020 1135   CHOLHDL 2.8 02/16/2020 1135   CHOLHDL 4.3 11/09/2018 0512   VLDL 11 11/09/2018 0512   LDLCALC 76 02/16/2020 1135    Physical Exam:    VS:  BP 108/68   Pulse 69   Ht 5\' 3"  (1.6 m)   Wt 106 lb (48.1 kg)   LMP  (LMP Unknown)   SpO2 97%   BMI 18.78 kg/m     Wt Readings from Last 3 Encounters:  04/24/20 106 lb (48.1 kg)  03/08/20 105 lb (47.6 kg)  02/16/20 104 lb (47.2 kg)     GEN:  Well nourished, well developed in no acute distress HEENT: Normal NECK: No JVD; No carotid bruits LYMPHATICS: No lymphadenopathy CARDIAC: RRR, no murmurs, rubs, gallops RESPIRATORY:  Clear to auscultation without rales, wheezing or rhonchi  ABDOMEN: Soft, non-tender, non-distended MUSCULOSKELETAL:  No edema; No deformity  SKIN: Warm and dry NEUROLOGIC:  Alert and oriented x 3 PSYCHIATRIC:  Normal affect    Signed, 02/18/20, MD  04/24/2020 12:13 PM    [facility]

## 2020-04-24 ENCOUNTER — Ambulatory Visit: Payer: PPO | Admitting: Cardiology

## 2020-04-24 ENCOUNTER — Encounter: Payer: Self-pay | Admitting: Cardiology

## 2020-04-24 ENCOUNTER — Other Ambulatory Visit: Payer: Self-pay

## 2020-04-24 ENCOUNTER — Ambulatory Visit (INDEPENDENT_AMBULATORY_CARE_PROVIDER_SITE_OTHER): Payer: PPO

## 2020-04-24 VITALS — BP 108/68 | HR 69 | Ht 63.0 in | Wt 106.0 lb

## 2020-04-24 DIAGNOSIS — I480 Paroxysmal atrial fibrillation: Secondary | ICD-10-CM

## 2020-04-24 DIAGNOSIS — Z7901 Long term (current) use of anticoagulants: Secondary | ICD-10-CM | POA: Diagnosis not present

## 2020-04-24 DIAGNOSIS — I4819 Other persistent atrial fibrillation: Secondary | ICD-10-CM | POA: Diagnosis not present

## 2020-04-24 DIAGNOSIS — E7800 Pure hypercholesterolemia, unspecified: Secondary | ICD-10-CM | POA: Diagnosis not present

## 2020-04-24 MED ORDER — APIXABAN 2.5 MG PO TABS: 2.5000 mg | ORAL_TABLET | Freq: Two times a day (BID) | ORAL | 3 refills | Status: DC

## 2020-04-24 NOTE — Patient Instructions (Signed)
Medication Instructions:  Your physician has recommended you make the following change in your medication:  STOP: Xarelto START: Eliquis 2.5 mg take one tablet by mouth twice daily.  *If you need a refill on your cardiac medications before your next appointment, please call your pharmacy*   Lab Work: None If you have labs (blood work) drawn today and your tests are completely normal, you will receive your results only by:  MyChart Message (if you have MyChart) OR  A paper copy in the mail If you have any lab test that is abnormal or we need to change your treatment, we will call you to review the results.   Testing/Procedures: A zio monitor was ordered today. It will remain on for 14 days. You will then return monitor and event diary in provided box. It takes 1-2 weeks for report to be downloaded and returned to [place]. We will call you with the results. If monitor falls off or has orange flashing light, please call Zio for further instructions.      Follow-Up: At [facility], you and your health needs are our priority.  As part of our continuing mission to provide you with exceptional heart care, we have created designated Provider Care Teams.  These Care Teams include your primary Cardiologist (physician) and Advanced Practice Providers (APPs -  Physician Assistants and Nurse Practitioners) who all work together to provide you with the care you need, when you need it.  We recommend signing up for the patient portal called "MyChart".  Sign up information is provided on this After Visit Summary.  MyChart is used to connect with patients for Virtual Visits (Telemedicine).  Patients are able to view lab/test results, encounter notes, upcoming appointments, etc.  Non-urgent messages can be sent to your provider as well.   To learn more about what you can do with MyChart, go to [email or web address].    Your next appointment:   6 week(s)  The format for your next appointment:   In  Person  Provider:   Norman Herrlich, MD   Other Instructions

## 2020-05-17 DIAGNOSIS — I482 Chronic atrial fibrillation, unspecified: Secondary | ICD-10-CM | POA: Diagnosis not present

## 2020-05-19 ENCOUNTER — Encounter: Payer: Self-pay | Admitting: Legal Medicine

## 2020-05-22 ENCOUNTER — Encounter: Payer: Self-pay | Admitting: Legal Medicine

## 2020-05-22 ENCOUNTER — Other Ambulatory Visit: Payer: Self-pay

## 2020-05-22 ENCOUNTER — Ambulatory Visit (INDEPENDENT_AMBULATORY_CARE_PROVIDER_SITE_OTHER): Payer: PPO | Admitting: Legal Medicine

## 2020-05-22 VITALS — BP 110/62 | HR 66 | Temp 97.2°F | Ht 63.0 in | Wt 106.0 lb

## 2020-05-22 DIAGNOSIS — I10 Essential (primary) hypertension: Secondary | ICD-10-CM | POA: Diagnosis not present

## 2020-05-22 DIAGNOSIS — Z681 Body mass index (BMI) 19 or less, adult: Secondary | ICD-10-CM

## 2020-05-22 DIAGNOSIS — I480 Paroxysmal atrial fibrillation: Secondary | ICD-10-CM

## 2020-05-22 DIAGNOSIS — Z23 Encounter for immunization: Secondary | ICD-10-CM

## 2020-05-22 DIAGNOSIS — I700 Atherosclerosis of aorta: Secondary | ICD-10-CM | POA: Insufficient documentation

## 2020-05-22 DIAGNOSIS — F01518 Vascular dementia, unspecified severity, with other behavioral disturbance: Secondary | ICD-10-CM

## 2020-05-22 DIAGNOSIS — E782 Mixed hyperlipidemia: Secondary | ICD-10-CM | POA: Diagnosis not present

## 2020-05-22 DIAGNOSIS — F0151 Vascular dementia with behavioral disturbance: Secondary | ICD-10-CM

## 2020-05-22 DIAGNOSIS — E440 Moderate protein-calorie malnutrition: Secondary | ICD-10-CM | POA: Diagnosis not present

## 2020-05-22 HISTORY — DX: Atherosclerosis of aorta: I70.0

## 2020-05-22 MED ORDER — MIRTAZAPINE 30 MG PO TABS: 30.0000 mg | ORAL_TABLET | Freq: Every day | ORAL | 6 refills | Status: DC

## 2020-05-22 NOTE — Progress Notes (Signed)
Subjective:  Patient ID: [name], female    DOB: Jun 14, 1926  Age: 86 y.o. MRN: [id number]  Chief Complaint  Patient presents with  . Benign Hypertension    3 month fasting follow up    HPI: chronic visit  Patient presents for follow up of hypertension.  Patient tolerating nione well with side effects.  Patient was diagnosed with hypertension 10 so has been treated for hypertension for 10 years.Patient is working on maintaining diet and exercise regimen and follows up as directed. Complication include dementia.  Dementia worsening we will increase remeron to 30mg   This patient has major depression for years.  PHQ9 =25.  Patient is having more anhedonia.  The patient has less future plans and prospects.  The depression is worse with time.  The patient is not exercising and working on behavior to improve mental health.  Patient is not seeing a therapist or psychiatrist.  Was seeing psychiatrist  Patient is on remeron.   Current Outpatient Medications on File Prior to Visit  Medication Sig Dispense Refill  . acebutolol (SECTRAL) 200 MG capsule Take 1 capsule (200 mg total) by mouth daily. 90 capsule 4  . apixaban (ELIQUIS) 2.5 MG TABS tablet Take 1 tablet (2.5 mg total) by mouth 2 (two) times daily. 60 tablet 3  . Apoaequorin (PREVAGEN PO) Take 1 tablet by mouth daily.    atorvastatin (LIPITOR) 40 MG tablet Take 1 tablet (40 mg total) by mouth daily. 90 tablet 2  . Cholecalciferol (VITAMIN D3) 1000 units CAPS Take 1,000 Units by mouth daily.    [name] docusate sodium (STOOL SOFTENER) 100 MG capsule Take 100 mg by mouth daily.    [name] levETIRAcetam (KEPPRA) 250 MG tablet Take 1 tablet (250 mg total) by mouth 2 (two) times daily. 60 tablet 11  . OVER THE COUNTER MEDICATION Take 1 tablet by mouth daily. Selenex GSH supplement    . prednisoLONE acetate (PRED FORTE) 1 % ophthalmic suspension SMARTSIG:1 Drop(s) Right Eye Twice Daily PRN    . vitamin B-12 (CYANOCOBALAMIN) 1000 MCG tablet Take  1,000 mcg by mouth daily.     No current facility-administered medications on file prior to visit.   Past Medical History:  Diagnosis Date  . Closed fracture of one rib of right side with routine healing 06/30/2017  . Confusion   . COPD (chronic obstructive pulmonary disease) (HCC) 11/16/2017  . Current use of long term anticoagulation 07/21/2017  . Depression   . First degree atrioventricular block 06/26/2017  . Hammer toe 06/26/2017  . Hearing loss   . Hyperlipidemia 06/26/2017  . Memory loss   . Other specified cardiac arrhythmias 06/26/2017  . Pain in limb 06/26/2017  . Palpitations 06/26/2017  . Premature atrial complex 06/26/2017  . Premature ventricular contractions 06/26/2017  . PSVT (paroxysmal supraventricular tachycardia) (HCC) 11/10/2017  . Stroke (HCC)   . Traumatic pneumothorax 06/30/2017   Past Surgical History:  Procedure Laterality Date  . ABDOMINAL HYSTERECTOMY    . SHOULDER SURGERY    . TONSILLECTOMY      Family History  Problem Relation Age of Onset  . Bone cancer Mother   . Breast cancer Father   . Diabetes Father   . Heart disease Father   . Breast cancer Sister   . Dementia Brother   . Alzheimer's disease Sister    Social History   Socioeconomic History  . Marital status: Widowed    Spouse name: Not on file  . Number of children: 2  .  Years of education: 4112  . Highest education level: High school graduate  Occupational History  . Occupation: Retired  Tobacco Use  . Smoking status: Never Smoker  . Smokeless tobacco: Never Used  Vaping Use  . Vaping Use: Never used  Substance and Sexual Activity  . Alcohol use: No  . Drug use: No  . Sexual activity: Not Currently  Other Topics Concern  . Not on file  Social History Narrative   [name] is widowed. Her daughter, Cloyd StagersGwen Dorsett, is her POA and lives 12 miles away. She resides in her own home, alone for last 7 years. Since her CVA she has had someone with her most of the time. She has a  hired caregiver or a family member stays most of the day. She feel safe in her home and in her neighborhood.   Right-handed.   0.5 cups caffeine per day.   Social Determinants of Health   Financial Resource Strain:   . Difficulty of Paying Living Expenses: Not on file  Food Insecurity:   . Worried About Programme researcher, broadcasting/film/videounning Out of Food in the Last Year: Not on file  . Ran Out of Food in the Last Year: Not on file  Transportation Needs:   . Lack of Transportation (Medical): Not on file  . Lack of Transportation (Non-Medical): Not on file  Physical Activity:   . Days of Exercise per Week: Not on file  . Minutes of Exercise per Session: Not on file  Stress:   . Feeling of Stress : Not on file  Social Connections:   . Frequency of Communication with Friends and Family: Not on file  . Frequency of Social Gatherings with Friends and Family: Not on file  . Attends Religious Services: Not on file  . Active Member of Clubs or Organizations: Not on file  . Attends BankerClub or Organization Meetings: Not on file  . Marital Status: Not on file    Review of Systems  Constitutional: Negative.   HENT: Negative.   Eyes: Negative.   Respiratory: Negative.  Negative for cough and shortness of breath.   Cardiovascular: Positive for palpitations. Negative for chest pain and leg swelling.  Gastrointestinal: Negative.   Endocrine: Negative.   Musculoskeletal: Negative.   Neurological: Positive for seizures.  Psychiatric/Behavioral: Negative.      Objective:  BP 110/62 (BP Location: Right Arm, Patient Position: Sitting)   Pulse 66   Temp (!) 97.2 F (36.2 C) (Temporal)   Ht 5\' 3"  (1.6 m)   Wt 106 lb (48.1 kg)   LMP  (LMP Unknown)   SpO2 99%   BMI 18.78 kg/m   BP/Weight 05/22/2020 04/24/2020 03/08/2020  Systolic BP 110 108 122  Diastolic BP 62 68 76  Wt. (Lbs) 106 106 105  BMI 18.78 18.78 18.6    Physical Exam Vitals reviewed.  Constitutional:      Appearance: Normal appearance.  HENT:     Head:  Normocephalic.     Right Ear: There is impacted cerumen.     Left Ear: Tympanic membrane normal.     Nose: Nose normal.  Eyes:     Extraocular Movements: Extraocular movements intact.     Conjunctiva/sclera: Conjunctivae normal.     Pupils: Pupils are equal, round, and reactive to light.  Cardiovascular:     Rate and Rhythm: Normal rate. Rhythm irregular.     Pulses: Normal pulses.     Heart sounds: Normal heart sounds.  Pulmonary:     Effort: Pulmonary  effort is normal.     Breath sounds: Normal breath sounds.  Abdominal:     General: Abdomen is flat. Bowel sounds are normal.  Skin:    General: Skin is warm.     Capillary Refill: Capillary refill takes less than 2 seconds.  Neurological:     General: No focal deficit present.     Mental Status: She is alert. She is disoriented.       Lab Results  Component Value Date   WBC 5.7 02/16/2020   HGB 12.4 02/16/2020   HCT 35.9 02/16/2020   PLT 212 02/16/2020   GLUCOSE 91 02/16/2020   CHOL 143 02/16/2020   TRIG 75 02/16/2020   HDL 52 02/16/2020   LDLCALC 76 02/16/2020   ALT 15 02/16/2020   AST 23 02/16/2020   NA 139 02/16/2020   K 4.9 02/16/2020   CL 102 02/16/2020   CREATININE 0.73 02/16/2020   BUN 17 02/16/2020   CO2 25 02/16/2020   HGBA1C 5.3 11/09/2018      Assessment & Plan:    1. Need for vaccination - Flu Vaccine QUAD High Dose(Fluad)  2. Dementia, multiinfarct, with behavioral disturbance (HCC) - mirtazapine (REMERON) 30 MG tablet; Take 1 tablet (30 mg total) by mouth at bedtime.  Dispense: 30 tablet; Refill: 6 - TSH Patient has chronic dementia and sholud not drive.  Her depression is worsening.  We will increase remiron  3. Essential hypertension, benign - CBC with Differential/Platelet - Comprehensive metabolic panel An individual hypertension care plan was established and reinforced today.  The patient's status was assessed using clinical findings on exam and labs or diagnostic tests. The  patient's success at meeting treatment goals on disease specific evidence-based guidelines and found to be well controlled. SELF MANAGEMENT: The patient and I together assessed ways to personally work towards obtaining the recommended goals. RECOMMENDATIONS: avoid decongestants found in common cold remedies, decrease consumption of alcohol, perform routine monitoring of BP with home BP cuff, exercise, reduction of dietary salt, take medicines as prescribed, try not to miss doses and quit smoking.  Regular exercise and maintaining a healthy weight is needed.  Stress reduction may help. A CLINICAL SUMMARY including written plan identify barriers to care unique to individual due to social or financial issues.  We attempt to mutually creat solutions for individual and family understanding.  4. Paroxysmal atrial fibrillation (HCC) AN INDIVIDUAL CARE PLAN atrial fibrillation was established and reinforced today.  The patient's status was assessed using clinical findings on exam, labs, and other diagnostic testing. Patient's success at meeting treatment goals based on disease specific evidence-bassed guidelines and found to be in good control. RECOMMENDATIONS include maintaining present medicines and treatment.  5. Malnutrition of moderate degree (HCC) Supplement nutrition with protein/calorie supplement with meals to improve nutritional status.  6. Thoracic aorta atherosclerosis (HCC) Followed by cardiology  7. Body mass index (BMI) less than 19 Supplement nutrition with protein/calorie supplement with meals to improve nutritional status.  8. Mixed hyperlipidemia - Lipid panel AN INDIVIDUAL CARE PLAN for hyperlipidemia/ cholesterol was established and reinforced today.  The patient's status was assessed using clinical findings on exam, lab and other diagnostic tests. The patient's disease status was assessed based on evidence-based guidelines and found to be fair controlled. MEDICATIONS were  reviewed. SELF MANAGEMENT GOALS have been discussed and patient's success at attaining the goal of low cholesterol was assessed. RECOMMENDATION given include regular exercise 3 days a week and low cholesterol/low fat diet. CLINICAL SUMMARY including written  plan to identify barriers unique to the patient due to social or economic  reasons was discussed.    Meds ordered this encounter  Medications  . mirtazapine (REMERON) 30 MG tablet    Sig: Take 1 tablet (30 mg total) by mouth at bedtime.    Dispense:  30 tablet    Refill:  6    Orders Placed This Encounter  Procedures  . Flu Vaccine QUAD High Dose(Fluad)  . CBC with Differential/Platelet  . Comprehensive metabolic panel  . Lipid panel  . TSH     Follow-up: No follow-ups on file.  An After Visit Summary was printed and given to the patient.  [facility] [phone]

## 2020-05-23 LAB — LIPID PANEL
Chol/HDL Ratio: 2.6 ratio (ref 0.0–4.4)
Cholesterol, Total: 128 mg/dL (ref 100–199)
HDL: 50 mg/dL (ref >39)
LDL Chol Calc (NIH): 62 mg/dL (ref 0–99)
Triglycerides: 81 mg/dL (ref 0–149)
VLDL Cholesterol Cal: 16 mg/dL (ref 5–40)

## 2020-05-23 LAB — CBC WITH DIFFERENTIAL/PLATELET
Basophils Absolute: 0 10*3/uL (ref 0.0–0.2)
Basos: 1 %
EOS (ABSOLUTE): 0.1 10*3/uL (ref 0.0–0.4)
Eos: 2 %
Hematocrit: 35.9 % (ref 34.0–46.6)
Hemoglobin: 12.3 g/dL (ref 11.1–15.9)
Immature Grans (Abs): 0 10*3/uL (ref 0.0–0.1)
Immature Granulocytes: 1 %
Lymphocytes Absolute: 2.1 10*3/uL (ref 0.7–3.1)
Lymphs: 31 %
MCH: 34.2 pg — ABNORMAL HIGH (ref 26.6–33.0)
MCHC: 34.3 g/dL (ref 31.5–35.7)
MCV: 100 fL — ABNORMAL HIGH (ref 79–97)
Monocytes Absolute: 0.9 10*3/uL (ref 0.1–0.9)
Monocytes: 13 %
Neutrophils Absolute: 3.6 10*3/uL (ref 1.4–7.0)
Neutrophils: 52 %
Platelets: 289 10*3/uL (ref 150–450)
RBC: 3.6 x10E6/uL — ABNORMAL LOW (ref 3.77–5.28)
RDW: 13.3 % (ref 11.7–15.4)
WBC: 6.7 10*3/uL (ref 3.4–10.8)

## 2020-05-23 LAB — COMPREHENSIVE METABOLIC PANEL
ALT: 11 IU/L (ref 0–32)
AST: 15 IU/L (ref 0–40)
Albumin/Globulin Ratio: 1.9 (ref 1.2–2.2)
Albumin: 4.3 g/dL (ref 3.5–4.6)
Alkaline Phosphatase: 65 IU/L (ref 44–121)
BUN/Creatinine Ratio: 19 (ref 12–28)
BUN: 13 mg/dL (ref 10–36)
Bilirubin Total: 0.7 mg/dL (ref 0.0–1.2)
CO2: 24 mmol/L (ref 20–29)
Calcium: 9.2 mg/dL (ref 8.7–10.3)
Chloride: 104 mmol/L (ref 96–106)
Creatinine, Ser: 0.68 mg/dL (ref 0.57–1.00)
GFR calc Af Amer: 88 mL/min/{1.73_m2} (ref >59)
GFR calc non Af Amer: 77 mL/min/{1.73_m2} (ref >59)
Globulin, Total: 2.3 g/dL (ref 1.5–4.5)
Glucose: 88 mg/dL (ref 65–99)
Potassium: 4.8 mmol/L (ref 3.5–5.2)
Sodium: 139 mmol/L (ref 134–144)
Total Protein: 6.6 g/dL (ref 6.0–8.5)

## 2020-05-23 LAB — TSH: TSH: 1.46 u[IU]/mL (ref 0.450–4.500)

## 2020-05-23 LAB — CARDIOVASCULAR RISK ASSESSMENT

## 2020-05-23 NOTE — Progress Notes (Signed)
No anemia, kidney and liver tests normal, Cholesterol normal, TSH 1.46 normal lp

## 2020-05-28 NOTE — Progress Notes (Signed)
Cardiology Office Note:    Date:  05/29/2020   ID:  [name], DOB 1925/11/22, MRN [id number]  PCP:  Abigail Miyamoto, MD  Cardiologist:  Norman Herrlich, MD    Referring MD: Abigail Miyamoto,*    ASSESSMENT:    1. Paroxysmal atrial fibrillation (HCC)   2. PAT (paroxysmal atrial tachycardia) (HCC)   3. Current use of long term anticoagulation   4. Pure hypercholesterolemia    PLAN:    In order of problems listed above:  1. [name] continues to do well with her low-dose beta-blocker for atrial tachycardia atrial fibrillation continue her current reduced dose anticoagulant for stroke prophylaxis 2. Stable continue her statin lipids at target   Next appointment: 6 months   Medication Adjustments/Labs and Tests Ordered: Current medicines are reviewed at length with the patient today.  Concerns regarding medicines are outlined above.  No orders of the defined types were placed in this encounter.  No orders of the defined types were placed in this encounter.   Chief Complaint  Patient presents with  . Follow-up  . Atrial Fibrillation    History of Present Illness:    [name] is a 86 y.o. female with a hx of atrial fibrillation on anticoagulation, PAT, DLD ,COPD, CVA (11/08/18) L middle cerebral artery after Xarelto withdrawn for eye surgery last seen 04/24/2020. Compliance with diet, lifestyle and medications: Yes  We reviewed her ZIO monitor reported 05/18/2020 showed rare ventricular ectopy frequent supraventricular ectopy with brief runs of APCs but no episodes of atrial fibrillation or flutter and no bradycardic events.  Her daughter who supervises her at home is present participates in the evaluation and decision-making.  [name] is pleased with the quality of her life and is not having chest pain palpitation shortness of breath or syncope.  She is awaiting her booster dose of Pfizer COVID vaccine practices safe isolation mask distance and we  discussed postexposure prophylaxis of monoclonal antibodies. Past Medical History:  Diagnosis Date  . Acute ischemic stroke (HCC) 11/08/2018  . Adjustment disorder with depressed mood 10/16/2019  . Alteration consciousness 03/08/2020  . Apraxia following cerebrovascular accident 10/16/2019  . B12 deficiency 10/16/2019  . Body mass index (BMI) less than 19 02/16/2020  . Cerebrovascular accident (CVA) (HCC) 03/08/2020  . Chronic kidney disease, stage II (mild) 12/06/2019  . Closed fracture of one rib of right side with routine healing 06/30/2017  . Confusion   . COPD (chronic obstructive pulmonary disease) (HCC) 11/16/2017  . Current use of long term anticoagulation 07/21/2017  . Dementia, multiinfarct, with behavioral disturbance (HCC) 01/18/2020  . Depression   . Essential hypertension, benign 10/16/2019  . First degree atrioventricular block 06/26/2017  . Hammer toe 06/26/2017  . Hearing loss   . Hyperlipidemia 06/26/2017  . Malnutrition of moderate degree (HCC) 12/06/2019  . Memory loss   . Other specified cardiac arrhythmias 06/26/2017  . Pain in limb 06/26/2017  . Palpitations 06/26/2017  . Paroxysmal atrial fibrillation (HCC) 06/26/2017   Formatting of this note might be different from the original. Overview:  remote history post operatively, CHADS 2 score=2 Formatting of this note might be different from the original. remote history post operatively, CHADS 2 score=2  . Premature atrial complex 06/26/2017  . Premature ventricular contractions 06/26/2017  . PSVT (paroxysmal supraventricular tachycardia) (HCC) 11/10/2017  . 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 Stroke (HCC)   . Thoracic aorta atherosclerosis (HCC) 05/22/2020  . Traumatic pneumothorax 06/30/2017  Past Surgical History:  Procedure Laterality Date  . ABDOMINAL HYSTERECTOMY    . SHOULDER SURGERY    . TONSILLECTOMY      Current Medications: Current Meds  Medication Sig  . acebutolol (SECTRAL) 200 MG  capsule Take 1 capsule (200 mg total) by mouth daily.  [name] apixaban (ELIQUIS) 2.5 MG TABS tablet Take 1 tablet (2.5 mg total) by mouth 2 (two) times daily.  [name] Apoaequorin (PREVAGEN PO) Take 1 tablet by mouth daily.  [name] atorvastatin (LIPITOR) 40 MG tablet Take 1 tablet (40 mg total) by mouth daily.  . Cholecalciferol (VITAMIN D3) 1000 units CAPS Take 1,000 Units by mouth daily.  [name] docusate sodium (STOOL SOFTENER) 100 MG capsule Take 100 mg by mouth daily.  [name] levETIRAcetam (KEPPRA) 250 MG tablet Take 1 tablet (250 mg total) by mouth 2 (two) times daily.  . mirtazapine (REMERON) 30 MG tablet Take 1 tablet (30 mg total) by mouth at bedtime.  [name] OVER THE COUNTER MEDICATION Take 1 tablet by mouth daily. Selenex GSH supplement  . prednisoLONE acetate (PRED FORTE) 1 % ophthalmic suspension SMARTSIG:1 Drop(s) Right Eye Twice Daily PRN  . vitamin B-12 (CYANOCOBALAMIN) 500 MCG tablet Take 500 mcg by mouth daily.      Allergies:   Codeine, Penicillins, and Sulfa antibiotics   Social History   Socioeconomic History  . Marital status: Widowed    Spouse name: Not on file  . Number of children: 2  . Years of education: 1  . Highest education level: High school graduate  Occupational History  . Occupation: Retired  Tobacco Use  . Smoking status: Never Smoker  . Smokeless tobacco: Never Used  Vaping Use  . Vaping Use: Never used  Substance and Sexual Activity  . Alcohol use: No  . Drug use: No  . Sexual activity: Not Currently  Other Topics Concern  . Not on file  Social History Narrative   [name] is widowed. Her daughter, [name], is her POA and lives 12 miles away. She resides in her own home, alone for last 7 years. Since her CVA she has had someone with her most of the time. She has a hired caregiver or a family member stays most of the day. She feel safe in her home and in her neighborhood.   Right-handed.   0.5 cups caffeine per day.   Social Determinants of Health   Financial  Resource Strain:   . Difficulty of Paying Living Expenses: Not on file  Food Insecurity:   . Worried About Programme researcher, broadcasting/film/video in the Last Year: Not on file  . Ran Out of Food in the Last Year: Not on file  Transportation Needs:   . Lack of Transportation (Medical): Not on file  . Lack of Transportation (Non-Medical): Not on file  Physical Activity:   . Days of Exercise per Week: Not on file  . Minutes of Exercise per Session: Not on file  Stress:   . Feeling of Stress : Not on file  Social Connections:   . Frequency of Communication with Friends and Family: Not on file  . Frequency of Social Gatherings with Friends and Family: Not on file  . Attends Religious Services: Not on file  . Active Member of Clubs or Organizations: Not on file  . Attends Banker Meetings: Not on file  . Marital Status: Not on file     Family History: The patient's family history includes Alzheimer's disease in her sister; Bone cancer  in her mother; Breast cancer in her father and sister; Dementia in her brother; Diabetes in her father; Heart disease in her father. ROS:   Please see the history of present illness.    All other systems reviewed and are negative.  EKGs/Labs/Other Studies Reviewed:    The following studies were reviewed today:   Recent Labs: 05/22/2020: ALT 11; BUN 13; Creatinine, Ser 0.68; Hemoglobin 12.3; Platelets 289; Potassium 4.8; Sodium 139; TSH 1.460  Recent Lipid Panel    Component Value Date/Time   CHOL 128 05/22/2020 1104   TRIG 81 05/22/2020 1104   HDL 50 05/22/2020 1104   CHOLHDL 2.6 05/22/2020 1104   CHOLHDL 4.3 11/09/2018 0512   VLDL 11 11/09/2018 0512   LDLCALC 62 05/22/2020 1104    Physical Exam:    VS:  BP 121/70   Pulse 68   Ht 5\' 3"  (1.6 m)   Wt 105 lb 12.8 oz (48 kg)   LMP  (LMP Unknown)   SpO2 96%   BMI 18.74 kg/m     Wt Readings from Last 3 Encounters:  05/29/20 105 lb 12.8 oz (48 kg)  05/22/20 106 lb (48.1 kg)  04/24/20 106 lb  (48.1 kg)     GEN:  Well nourished, well developed in no acute distress HEENT: Normal NECK: No JVD; No carotid bruits LYMPHATICS: No lymphadenopathy CARDIAC: RRR, no murmurs, rubs, gallops RESPIRATORY:  Clear to auscultation without rales, wheezing or rhonchi  ABDOMEN: Soft, non-tender, non-distended MUSCULOSKELETAL:  No edema; No deformity  SKIN: Warm and dry NEUROLOGIC:  Alert and oriented x 3 PSYCHIATRIC:  Normal affect    Signed, 04/26/20, MD  05/29/2020 10:52 AM    [facility]

## 2020-05-29 ENCOUNTER — Ambulatory Visit: Payer: PPO | Admitting: Cardiology

## 2020-05-29 ENCOUNTER — Other Ambulatory Visit: Payer: Self-pay

## 2020-05-29 ENCOUNTER — Encounter: Payer: Self-pay | Admitting: Cardiology

## 2020-05-29 VITALS — BP 121/70 | HR 68 | Ht 63.0 in | Wt 105.8 lb

## 2020-05-29 DIAGNOSIS — Z7901 Long term (current) use of anticoagulants: Secondary | ICD-10-CM

## 2020-05-29 DIAGNOSIS — I471 Supraventricular tachycardia: Secondary | ICD-10-CM

## 2020-05-29 DIAGNOSIS — I4719 Other supraventricular tachycardia: Secondary | ICD-10-CM

## 2020-05-29 DIAGNOSIS — I480 Paroxysmal atrial fibrillation: Secondary | ICD-10-CM

## 2020-05-29 DIAGNOSIS — E7800 Pure hypercholesterolemia, unspecified: Secondary | ICD-10-CM

## 2020-05-29 NOTE — Patient Instructions (Signed)

## 2020-06-08 DIAGNOSIS — S81811A Laceration without foreign body, right lower leg, initial encounter: Secondary | ICD-10-CM | POA: Diagnosis not present

## 2020-06-11 DIAGNOSIS — S81811A Laceration without foreign body, right lower leg, initial encounter: Secondary | ICD-10-CM | POA: Diagnosis not present

## 2020-06-15 ENCOUNTER — Telehealth: Payer: Self-pay

## 2020-06-15 NOTE — Chronic Care Management (AMB) (Signed)
Chronic Care Management Pharmacy Assistant    Name: [name]  MRN: [id number] DOB: [birth year]    Reason for Encounter: Disease State - Hypertension and Lipid Adherence Call.   Patient Questions:  1.  Have you seen any other providers since your last visit? yes. 04/24/2020 - Katherine Priest MD- Cardiology - Atrial Fibrillation - Stop - Xarelto - Start - Eliquis 2.5 one tablet bid. Follow up 6 weeks. 05/22/2020 Katherine Anes, MD - Family Medicine. Follow up 3 months   2.  Any changes in your medicines or health? started Eliquis 2.5 one tablet bid - stop Xarelto pre Dr. Bettina Singh.     PCP : Katherine Anes, MD  Allergies:   Allergies  Allergen Reactions  . Codeine Nausea And Vomiting  . Penicillins Rash  . Sulfa Antibiotics Nausea And Vomiting    Medications: Outpatient Encounter Medications as of 06/15/2020  Medication Sig  . acebutolol (SECTRAL) 200 MG capsule Take 1 capsule (200 mg total) by mouth daily.  [name] apixaban (ELIQUIS) 2.5 MG TABS tablet Take 1 tablet (2.5 mg total) by mouth 2 (two) times daily.  [name] Apoaequorin (PREVAGEN PO) Take 1 tablet by mouth daily.  [name] atorvastatin (LIPITOR) 40 MG tablet Take 1 tablet (40 mg total) by mouth daily.  . Cholecalciferol (VITAMIN D3) 1000 units CAPS Take 1,000 Units by mouth daily.  [name] docusate sodium (STOOL SOFTENER) 100 MG capsule Take 100 mg by mouth daily.  [name] levETIRAcetam (KEPPRA) 250 MG tablet Take 1 tablet (250 mg total) by mouth 2 (two) times daily.  . mirtazapine (REMERON) 30 MG tablet Take 1 tablet (30 mg total) by mouth at bedtime.  [name] OVER THE COUNTER MEDICATION Take 1 tablet by mouth daily. Selenex GSH supplement  . prednisoLONE acetate (PRED FORTE) 1 % ophthalmic suspension SMARTSIG:1 Drop(s) Right Eye Twice Daily PRN  . vitamin B-12 (CYANOCOBALAMIN) 500 MCG tablet Take 500 mcg by mouth daily.    No facility-administered encounter medications on file as of 06/15/2020.    Current  Diagnosis: Patient Active Problem List   Diagnosis Date Noted  . Thoracic aorta atherosclerosis ([place]) 05/22/2020  . Stroke ([place])   . Hearing loss   . Depression   . Confusion   . Alteration consciousness 03/08/2020  . Cerebrovascular accident (CVA) ([place]) 03/08/2020  . Memory loss 03/08/2020  . Body mass index (BMI) less than 19 02/16/2020  . Dementia, multiinfarct, with behavioral disturbance ([place]) 01/18/2020  . Chronic kidney disease, stage II (mild) 12/06/2019  . Malnutrition of moderate degree ([place]) 12/06/2019  . Essential hypertension, benign 10/16/2019  . Adjustment disorder with depressed mood 10/16/2019  . 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 Apraxia following cerebrovascular accident 10/16/2019  . B12 deficiency 10/16/2019  . Acute ischemic stroke ([place]) 11/08/2018  . COPD (chronic obstructive pulmonary disease) ([place]) 11/16/2017  . PSVT (paroxysmal supraventricular tachycardia) ([place]) 11/10/2017  . Current use of long term anticoagulation 07/21/2017  . Traumatic pneumothorax 06/30/2017  . Closed fracture of one rib of right side with routine healing 06/30/2017  . First degree atrioventricular block 06/26/2017  . Hammer toe 06/26/2017  . Hyperlipidemia 06/26/2017  . Paroxysmal atrial fibrillation ([place]) 06/26/2017  . Premature ventricular contractions 06/26/2017  . Premature atrial complex 06/26/2017  . Pain in limb 06/26/2017  . Other specified cardiac arrhythmias 06/26/2017  . Palpitations 06/26/2017   Reviewed chart prior to disease state call. Spoke with patient regarding BP  Recent Office Vitals: BP Readings from Last 3  Encounters:  05/29/20 121/70  05/22/20 110/62  04/24/20 108/68   Pulse Readings from Last 3 Encounters:  05/29/20 68  05/22/20 66  04/24/20 69    Wt Readings from Last 3 Encounters:  05/29/20 105 lb 12.8 oz (48 kg)  05/22/20 106 lb (48.1 kg)  04/24/20 106 lb (48.1 kg)     Kidney Function Lab Results  Component  Value Date/Time   CREATININE 0.68 05/22/2020 11:04 AM   CREATININE 0.73 02/16/2020 11:35 AM   GFRNONAA 77 05/22/2020 11:04 AM   GFRAA 88 05/22/2020 11:04 AM    BMP Latest Ref Rng & Units 05/22/2020 02/16/2020 01/24/2020  Glucose 65 - 99 mg/dL 88 91 122(H)  BUN 10 - 36 mg/dL 13 17 21   Creatinine 0.57 - 1.00 mg/dL 0.68 0.73 0.83  BUN/Creat Ratio 12 - 28 19 23 25   Sodium 134 - 144 mmol/L 139 139 140  Potassium 3.5 - 5.2 mmol/L 4.8 4.9 4.6  Chloride 96 - 106 mmol/L 104 102 103  CO2 20 - 29 mmol/L 24 25 22   Calcium 8.7 - 10.3 mg/dL 9.2 9.4 9.6    . Current antihypertensive regimen:  o None . How often are you checking your Blood Pressure? Not Often per daughter .  [name] Current home BP readings: Daughter states that when she does take blood pressures it is always normal.  . What recent interventions/DTPs have been made by any provider to improve Blood Pressure control since last CPP Visit:  Patient does take all medication on list as directed by provider.  . Any recent hospitalizations or ED visits since last visit with CPP? No  . What diet changes have been made to improve Blood Pressure Control?  o Daughter states she eats what she wants to eat .  [name] What exercise is being done to improve your Blood Pressure Control?  o Patient does not have a exercise regimen. Daughter states she is not active .  Adherence Review: Is the patient currently on ACE/ARB medication? No Does the patient have >5 day gap between last estimated fill dates? No   Comprehensive medication review performed; Spoke to patient regarding cholesterol  Lipid Panel    Component Value Date/Time   CHOL 128 05/22/2020 1104   TRIG 81 05/22/2020 1104   HDL 50 05/22/2020 1104   LDLCALC 62 05/22/2020 1104    10-year ASCVD risk score: The ASCVD Risk score Mikey Bussing DC Jr., et al., 2013) failed to calculate for the following reasons:   The 2013 ASCVD risk score is only valid for ages 62 to 78   The patient has a prior MI or  stroke diagnosis  . Current antihyperlipidemic regimen:  o Atorvastatin 40 mg take one tablet a day  . Previous antihyperlipidemic medications tried: Yes. Pravastatin  . ASCVD risk enhancing conditions: age >26, DM, HTN, CKD, CHF, current smoker  . What recent interventions/DTPs have been made by any provider to improve Cholesterol control since last CPP Visit:  Patient does take her medications as directed by provider.  . Any recent hospitalizations or ED visits since last visit with CPP? No  . What diet changes have been made to improve Cholesterol?  o [name] states that she does not have a big appetite, so she eats what she wants.   . What exercise is being done to improve Cholesterol?  o Daughter [employer]) states that patient is not very active. [name] states patient has been siting in chair more.  Adherence Review: Does the patient have >  5 day gap between last estimated fill dates? No        Goals Addressed            This Visit's Progress   . Pharmacy Care Plan   On track    CARE PLAN ENTRY (see longitudinal plan of care for additional care plan information)  Current Barriers:  . Hypertension/Afib, complicated by hypotension . Current antihypertensive regimen: acebutolol 200 mg daily . Last practice recorded BP readings:  BP Readings from Last 3 Encounters:  03/08/20 122/76  02/16/20 130/70  01/24/20 (!) 90/52 .   [name] Current home BP readings: Waiting on monitor to be approved . Most recent eGFR/CrCl: No results found for: EGFR  No components found for: CRCL  Pharmacist Clinical Goal(s):  [name] Over the next 60 days, patient will work with PharmD and providers to optimize blood pressure readings.   Interventions: . Inter-disciplinary care team collaboration (see longitudinal plan of care) . Comprehensive medication review performed; medication list updated in the electronic medical record.  . Ensure safety, efficacy, and affordability of medications . Discussed  patient tolerating acebutolol well once daily.    Patient Self Care Activities:  . Patient will continue to check BP weekly once meter available , document, and provide at future appointments . Patient will focus on medication adherence by continuing to use daily medication planner.  Please see past updates related to this goal by clicking on the "Past Updates" button in the selected goal         Follow-Up:  Pharmacist Review - per Daughter Meredith Mody ,  Patient needs a 90 day  refill for Mirtazapine (Remeron) 30 mg tablet sent to pharmacy.  Elly Modena Brown,CPP Notified.  Judithann Sheen, [facility] Clinical Pharmacist Assistant [phone]

## 2020-06-18 ENCOUNTER — Encounter: Payer: Self-pay | Admitting: Legal Medicine

## 2020-06-18 ENCOUNTER — Ambulatory Visit (INDEPENDENT_AMBULATORY_CARE_PROVIDER_SITE_OTHER): Payer: PPO | Admitting: Legal Medicine

## 2020-06-18 ENCOUNTER — Other Ambulatory Visit: Payer: Self-pay

## 2020-06-18 DIAGNOSIS — S81811A Laceration without foreign body, right lower leg, initial encounter: Secondary | ICD-10-CM | POA: Diagnosis not present

## 2020-06-18 HISTORY — DX: Laceration without foreign body, right lower leg, initial encounter: S81.811A

## 2020-06-18 MED ORDER — SILVER SULFADIAZINE 1 % EX CREA: 1.0000 "application " | TOPICAL_CREAM | Freq: Every day | CUTANEOUS | 2 refills | Status: DC

## 2020-06-18 MED ORDER — CLINDAMYCIN HCL 150 MG PO CAPS: 150.0000 mg | ORAL_CAPSULE | Freq: Three times a day (TID) | ORAL | 0 refills | Status: DC

## 2020-06-18 NOTE — Progress Notes (Signed)
Acute Office Visit  Subjective:    Patient ID: [name], female    DOB: 1926/02/25, 86 y.o.   MRN: [id number]  Chief Complaint  Patient presents with  . Wound Infection    Since 2 weeks ago, patient went to urgent care because she hurts her leg with the chair.     Patient is in today for for lage skin avulsion right lower leg by scraping on chair.  Accident happened on 06/06/2020.  She went to urgent care and wrapped.  She has large piece of necrotic skin 4 x 5 cm.  Laceration is 5 by 6 cm wide.  Past Medical History:  Diagnosis Date  . Acute ischemic stroke ([facility]) 11/08/2018  . Adjustment disorder with depressed mood 10/16/2019  . Alteration consciousness 03/08/2020  . Apraxia following cerebrovascular accident 10/16/2019  . B12 deficiency 10/16/2019  . Body mass index (BMI) less than 19 02/16/2020  . Cerebrovascular accident (CVA) ([facility]) 03/08/2020  . Chronic kidney disease, stage II (mild) 12/06/2019  . Closed fracture of one rib of right side with routine healing 06/30/2017  . Confusion   . COPD (chronic obstructive pulmonary disease) ([facility]) 11/16/2017  . Current use of long term anticoagulation 07/21/2017  . Dementia, multiinfarct, with behavioral disturbance ([facility]) 01/18/2020  . Depression   . Essential hypertension, benign 10/16/2019  . First degree atrioventricular block 06/26/2017  . Hammer toe 06/26/2017  . Hearing loss   . Hyperlipidemia 06/26/2017  . Malnutrition of moderate degree ([facility]) 12/06/2019  . Memory loss   . Other specified cardiac arrhythmias 06/26/2017  . Pain in limb 06/26/2017  . Palpitations 06/26/2017  . Paroxysmal atrial fibrillation ([facility]) 06/26/2017   Formatting of this note might be different from the original. Overview:  remote history post operatively, CHADS 2 score=2 Formatting of this note might be different from the original. remote history post operatively, CHADS 2 score=2  . Premature atrial complex 06/26/2017  . Premature ventricular contractions  06/26/2017  . PSVT (paroxysmal supraventricular tachycardia) ([facility]) 11/10/2017  . 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 Stroke ([facility])   . Thoracic aorta atherosclerosis ([facility]) 05/22/2020  . Traumatic pneumothorax 06/30/2017    Past Surgical History:  Procedure Laterality Date  . ABDOMINAL HYSTERECTOMY    . SHOULDER SURGERY    . TONSILLECTOMY      Family History  Problem Relation Age of Onset  . Bone cancer Mother   . Breast cancer Father   . Diabetes Father   . Heart disease Father   . Breast cancer Sister   . Dementia Brother   . Alzheimer's disease Sister     Social History   Socioeconomic History  . Marital status: Widowed    Spouse name: Not on file  . Number of children: 2  . Years of education: 28  . Highest education level: High school graduate  Occupational History  . Occupation: Retired  Tobacco Use  . Smoking status: Never Smoker  . Smokeless tobacco: Never Used  Vaping Use  . Vaping Use: Never used  Substance and Sexual Activity  . Alcohol use: No  . Drug use: No  . Sexual activity: Not Currently  Other Topics Concern  . Not on file  Social History Narrative   [name] is widowed. Her daughter, [name], is her POA and lives 12 miles away. She resides in her own home, alone for last 7 years. Since her CVA she has had someone with her most of the time. She  has a hired caregiver or a family member stays most of the day. She feel safe in her home and in her neighborhood.   Right-handed.   0.5 cups caffeine per day.   Social Determinants of Health   Financial Resource Strain:   . Difficulty of Paying Living Expenses: Not on file  Food Insecurity:   . Worried About Programme researcher, broadcasting/film/video in the Last Year: Not on file  . Ran Out of Food in the Last Year: Not on file  Transportation Needs:   . Lack of Transportation (Medical): Not on file  . Lack of Transportation (Non-Medical): Not on file  Physical Activity:   . Days of  Exercise per Week: Not on file  . Minutes of Exercise per Session: Not on file  Stress:   . Feeling of Stress : Not on file  Social Connections:   . Frequency of Communication with Friends and Family: Not on file  . Frequency of Social Gatherings with Friends and Family: Not on file  . Attends Religious Services: Not on file  . Active Member of Clubs or Organizations: Not on file  . Attends Banker Meetings: Not on file  . Marital Status: Not on file  Intimate Partner Violence:   . Fear of Current or Ex-Partner: Not on file  . Emotionally Abused: Not on file  . Physically Abused: Not on file  . Sexually Abused: Not on file    Outpatient Medications Prior to Visit  Medication Sig Dispense Refill  . acebutolol (SECTRAL) 200 MG capsule Take 1 capsule (200 mg total) by mouth daily. 90 capsule 4  . apixaban (ELIQUIS) 2.5 MG TABS tablet Take 1 tablet (2.5 mg total) by mouth 2 (two) times daily. 60 tablet 3  . Apoaequorin (PREVAGEN PO) Take 1 tablet by mouth daily.    [name] atorvastatin (LIPITOR) 40 MG tablet Take 1 tablet (40 mg total) by mouth daily. 90 tablet 2  . Cholecalciferol (VITAMIN D3) 1000 units CAPS Take 1,000 Units by mouth daily.    [name] docusate sodium (STOOL SOFTENER) 100 MG capsule Take 100 mg by mouth daily.    [name] levETIRAcetam (KEPPRA) 250 MG tablet Take 1 tablet (250 mg total) by mouth 2 (two) times daily. 60 tablet 11  . mirtazapine (REMERON) 30 MG tablet Take 1 tablet (30 mg total) by mouth at bedtime. 30 tablet 6  . OVER THE COUNTER MEDICATION Take 1 tablet by mouth daily. Selenex GSH supplement    . prednisoLONE acetate (PRED FORTE) 1 % ophthalmic suspension SMARTSIG:1 Drop(s) Right Eye Twice Daily PRN    . vitamin B-12 (CYANOCOBALAMIN) 500 MCG tablet Take 500 mcg by mouth daily.     . clindamycin (CLEOCIN) 300 MG capsule Take 300 mg by mouth 3 (three) times daily.     No facility-administered medications prior to visit.    Allergies  Allergen Reactions  .  Codeine Nausea And Vomiting  . Penicillins Rash  . Sulfa Antibiotics Nausea And Vomiting    Review of Systems  Constitutional: Negative.   HENT: Negative.   Respiratory: Negative for cough, choking, shortness of breath and stridor.   Cardiovascular: Negative for chest pain, palpitations and leg swelling.  Genitourinary: Negative.   Skin: Positive for wound (right lower leg).  Psychiatric/Behavioral: Negative.        Objective:    Physical Exam Vitals reviewed.  Constitutional:      Appearance: Normal appearance.  Cardiovascular:     Rate and Rhythm: Normal rate  and regular rhythm.     Pulses: Normal pulses.     Heart sounds: Normal heart sounds.  Pulmonary:     Effort: Pulmonary effort is normal.     Breath sounds: Normal breath sounds.  Skin:         Comments: 5 by 6 cm stage 3 open wound sharp dissection used to remove necrotic tissue  Neurological:     Mental Status: She is alert.     BP 102/64   Pulse 83   Temp (!) 97.4 F (36.3 C)   Resp 16   Ht 5\' 3"  (1.6 m)   Wt 108 lb (49 kg)   LMP  (LMP Unknown)   SpO2 97%   BMI 19.13 kg/m  Wt Readings from Last 3 Encounters:  06/18/20 108 lb (49 kg)  05/29/20 105 lb 12.8 oz (48 kg)  05/22/20 106 lb (48.1 kg)    Health Maintenance Due  Topic Date Due  . TETANUS/TDAP  Never done  . DEXA SCAN  Never done  . PNA vac Low Risk Adult (2 of 2 - PCV13) 08/31/2019    There are no preventive care reminders to display for this patient.   Lab Results  Component Value Date   TSH 1.460 05/22/2020   Lab Results  Component Value Date   WBC 6.7 05/22/2020   HGB 12.3 05/22/2020   HCT 35.9 05/22/2020   MCV 100 (H) 05/22/2020   PLT 289 05/22/2020   Lab Results  Component Value Date   NA 139 05/22/2020   K 4.8 05/22/2020   CO2 24 05/22/2020   GLUCOSE 88 05/22/2020   BUN 13 05/22/2020   CREATININE 0.68 05/22/2020   BILITOT 0.7 05/22/2020   ALKPHOS 65 05/22/2020   AST 15 05/22/2020   ALT 11 05/22/2020   PROT  6.6 05/22/2020   ALBUMIN 4.3 05/22/2020   CALCIUM 9.2 05/22/2020   ANIONGAP 6 11/09/2018   Lab Results  Component Value Date   CHOL 128 05/22/2020   Lab Results  Component Value Date   HDL 50 05/22/2020   Lab Results  Component Value Date   LDLCALC 62 05/22/2020   Lab Results  Component Value Date   TRIG 81 05/22/2020   Lab Results  Component Value Date   CHOLHDL 2.6 05/22/2020   Lab Results  Component Value Date   HGBA1C 5.3 11/09/2018       Assessment & Plan:  1. Laceration of right lower leg, initial encounter The laceration is an intermediate laceration requiring debridement of necrotic tissue by sharp dissection the wound was wrinsed clean with sterile saline and dressed with silvadene.  Change dressing daily.  Procedure: the wound was cleaned with betadine and washed clean.  The necrotic tissue 4x5 cm was dissected clear wit sharp dissection.  Minimal bleeding.  No complications.silvadine prescription given.     Follow-up: Return in about 3 days (around 06/21/2020) for wound. give tetanus next visit  An After Visit Summary was printed and given to the patient.  06/23/2020 [facility] [phone]

## 2020-06-19 ENCOUNTER — Other Ambulatory Visit: Payer: Self-pay

## 2020-06-19 DIAGNOSIS — F0151 Vascular dementia with behavioral disturbance: Secondary | ICD-10-CM

## 2020-06-19 DIAGNOSIS — F01518 Vascular dementia, unspecified severity, with other behavioral disturbance: Secondary | ICD-10-CM

## 2020-06-19 MED ORDER — MIRTAZAPINE 30 MG PO TABS: 30.0000 mg | ORAL_TABLET | Freq: Every day | ORAL | 1 refills | Status: DC

## 2020-06-21 ENCOUNTER — Ambulatory Visit: Payer: PPO | Admitting: Legal Medicine

## 2020-06-22 ENCOUNTER — Other Ambulatory Visit: Payer: Self-pay

## 2020-06-22 ENCOUNTER — Ambulatory Visit (INDEPENDENT_AMBULATORY_CARE_PROVIDER_SITE_OTHER): Payer: PPO | Admitting: Legal Medicine

## 2020-06-22 ENCOUNTER — Encounter: Payer: Self-pay | Admitting: Legal Medicine

## 2020-06-22 VITALS — BP 110/58 | HR 64 | Temp 97.6°F | Resp 16 | Ht 63.0 in | Wt 107.4 lb

## 2020-06-22 DIAGNOSIS — S81811A Laceration without foreign body, right lower leg, initial encounter: Secondary | ICD-10-CM | POA: Diagnosis not present

## 2020-06-22 NOTE — Progress Notes (Signed)
Acute Office Visit  Subjective:    Patient ID: [name], female    DOB: 08/20/1926, 86 y.o.   MRN: [id number]  Chief Complaint  Patient presents with  . Wound Check    been more painful, wondering about Home health coming out an helping     Patient is in today for for lage skin avulsion right lower leg by scraping on chair.  Accident happened on 06/06/2020.  She went to urgent care and wrapped.  She has large piece of necrotic skin 4 x 5 cm.  Laceration is 5 by 6 cm wide.  Follow up 06/22/2020, the wound is healing well.  Early granulation tissue. No need for debridement this time. Set up home health  nurse.  Past Medical History:  Diagnosis Date  . Acute ischemic stroke (HCC) 11/08/2018  . Adjustment disorder with depressed mood 10/16/2019  . Alteration consciousness 03/08/2020  . Apraxia following cerebrovascular accident 10/16/2019  . B12 deficiency 10/16/2019  . Body mass index (BMI) less than 19 02/16/2020  . Cerebrovascular accident (CVA) (HCC) 03/08/2020  . Chronic kidney disease, stage II (mild) 12/06/2019  . Closed fracture of one rib of right side with routine healing 06/30/2017  . Confusion   . COPD (chronic obstructive pulmonary disease) (HCC) 11/16/2017  . Current use of long term anticoagulation 07/21/2017  . Dementia, multiinfarct, with behavioral disturbance (HCC) 01/18/2020  . Depression   . Essential hypertension, benign 10/16/2019  . First degree atrioventricular block 06/26/2017  . Hammer toe 06/26/2017  . Hearing loss   . Hyperlipidemia 06/26/2017  . Malnutrition of moderate degree (HCC) 12/06/2019  . Memory loss   . Other specified cardiac arrhythmias 06/26/2017  . Pain in limb 06/26/2017  . Palpitations 06/26/2017  . Paroxysmal atrial fibrillation (HCC) 06/26/2017   Formatting of this note might be different from the original. Overview:  remote history post operatively, CHADS 2 score=2 Formatting of this note might be different from the original. remote  history post operatively, CHADS 2 score=2  . Premature atrial complex 06/26/2017  . Premature ventricular contractions 06/26/2017  . PSVT (paroxysmal supraventricular tachycardia) (HCC) 11/10/2017  . 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 Stroke (HCC)   . Thoracic aorta atherosclerosis (HCC) 05/22/2020  . Traumatic pneumothorax 06/30/2017    Past Surgical History:  Procedure Laterality Date  . ABDOMINAL HYSTERECTOMY    . SHOULDER SURGERY    . TONSILLECTOMY      Family History  Problem Relation Age of Onset  . Bone cancer Mother   . Breast cancer Father   . Diabetes Father   . Heart disease Father   . Breast cancer Sister   . Dementia Brother   . Alzheimer's disease Sister     Social History   Socioeconomic History  . Marital status: Widowed    Spouse name: Not on file  . Number of children: 2  . Years of education: 37  . Highest education level: High school graduate  Occupational History  . Occupation: Retired  Tobacco Use  . Smoking status: Never Smoker  . Smokeless tobacco: Never Used  Vaping Use  . Vaping Use: Never used  Substance and Sexual Activity  . Alcohol use: No  . Drug use: No  . Sexual activity: Not Currently  Other Topics Concern  . Not on file  Social History Narrative   [name] is widowed. Her daughter, [name], is her POA and lives 12 miles away. She resides in her own home,  alone for last 7 years. Since her CVA she has had someone with her most of the time. She has a hired caregiver or a family member stays most of the day. She feel safe in her home and in her neighborhood.   Right-handed.   0.5 cups caffeine per day.   Social Determinants of Health   Financial Resource Strain:   . Difficulty of Paying Living Expenses: Not on file  Food Insecurity:   . Worried About Programme researcher, broadcasting/film/video in the Last Year: Not on file  . Ran Out of Food in the Last Year: Not on file  Transportation Needs:   . Lack of  Transportation (Medical): Not on file  . Lack of Transportation (Non-Medical): Not on file  Physical Activity:   . Days of Exercise per Week: Not on file  . Minutes of Exercise per Session: Not on file  Stress:   . Feeling of Stress : Not on file  Social Connections:   . Frequency of Communication with Friends and Family: Not on file  . Frequency of Social Gatherings with Friends and Family: Not on file  . Attends Religious Services: Not on file  . Active Member of Clubs or Organizations: Not on file  . Attends Banker Meetings: Not on file  . Marital Status: Not on file  Intimate Partner Violence:   . Fear of Current or Ex-Partner: Not on file  . Emotionally Abused: Not on file  . Physically Abused: Not on file  . Sexually Abused: Not on file    Outpatient Medications Prior to Visit  Medication Sig Dispense Refill  . acebutolol (SECTRAL) 200 MG capsule Take 1 capsule (200 mg total) by mouth daily. 90 capsule 4  . apixaban (ELIQUIS) 2.5 MG TABS tablet Take 1 tablet (2.5 mg total) by mouth 2 (two) times daily. 60 tablet 3  . Apoaequorin (PREVAGEN PO) Take 1 tablet by mouth daily.    [name] atorvastatin (LIPITOR) 40 MG tablet Take 1 tablet (40 mg total) by mouth daily. 90 tablet 2  . Cholecalciferol (VITAMIN D3) 1000 units CAPS Take 1,000 Units by mouth daily.    . clindamycin (CLEOCIN) 150 MG capsule Take 1 capsule (150 mg total) by mouth 3 (three) times daily. 30 capsule 0  . docusate sodium (STOOL SOFTENER) 100 MG capsule Take 100 mg by mouth daily.    [name] levETIRAcetam (KEPPRA) 250 MG tablet Take 1 tablet (250 mg total) by mouth 2 (two) times daily. 60 tablet 11  . mirtazapine (REMERON) 30 MG tablet Take 1 tablet (30 mg total) by mouth at bedtime. 90 tablet 1  . OVER THE COUNTER MEDICATION Take 1 tablet by mouth daily. Selenex GSH supplement    . prednisoLONE acetate (PRED FORTE) 1 % ophthalmic suspension SMARTSIG:1 Drop(s) Right Eye Twice Daily PRN    . silver sulfADIAZINE  (SILVADENE) 1 % cream Apply 1 application topically daily. 50 g 2  . vitamin B-12 (CYANOCOBALAMIN) 500 MCG tablet Take 500 mcg by mouth daily.      No facility-administered medications prior to visit.    Allergies  Allergen Reactions  . Codeine Nausea And Vomiting  . Penicillins Rash  . Sulfa Antibiotics Nausea And Vomiting    Review of Systems  Constitutional: Negative.   HENT: Negative.   Respiratory: Negative for cough, choking, shortness of breath and stridor.   Cardiovascular: Negative for chest pain, palpitations and leg swelling.  Genitourinary: Negative.   Skin: Positive for wound (right lower leg).  Psychiatric/Behavioral: Negative.        Objective:    Physical Exam Vitals reviewed.  Constitutional:      Appearance: Normal appearance.  Cardiovascular:     Rate and Rhythm: Normal rate and regular rhythm.     Pulses: Normal pulses.     Heart sounds: Normal heart sounds.  Pulmonary:     Effort: Pulmonary effort is normal.     Breath sounds: Normal breath sounds.  Skin:         Comments: 5 by 6 cm stage 3 open wound no further necrotic tissue.  Early granulation of wound.  No infection  Neurological:     Mental Status: She is alert.     BP (!) 110/58   Pulse 64   Temp 97.6 F (36.4 C)   Resp 16   Ht 5\' 3"  (1.6 m)   Wt 107 lb 6.4 oz (48.7 kg)   LMP  (LMP Unknown)   SpO2 97%   BMI 19.03 kg/m  Wt Readings from Last 3 Encounters:  06/22/20 107 lb 6.4 oz (48.7 kg)  06/18/20 108 lb (49 kg)  05/29/20 105 lb 12.8 oz (48 kg)    Health Maintenance Due  Topic Date Due  . TETANUS/TDAP  Never done  . DEXA SCAN  Never done  . PNA vac Low Risk Adult (2 of 2 - PCV13) 08/31/2019    There are no preventive care reminders to display for this patient.   Lab Results  Component Value Date   TSH 1.460 05/22/2020   Lab Results  Component Value Date   WBC 6.7 05/22/2020   HGB 12.3 05/22/2020   HCT 35.9 05/22/2020   MCV 100 (H) 05/22/2020   PLT 289  05/22/2020   Lab Results  Component Value Date   NA 139 05/22/2020   K 4.8 05/22/2020   CO2 24 05/22/2020   GLUCOSE 88 05/22/2020   BUN 13 05/22/2020   CREATININE 0.68 05/22/2020   BILITOT 0.7 05/22/2020   ALKPHOS 65 05/22/2020   AST 15 05/22/2020   ALT 11 05/22/2020   PROT 6.6 05/22/2020   ALBUMIN 4.3 05/22/2020   CALCIUM 9.2 05/22/2020   ANIONGAP 6 11/09/2018   Lab Results  Component Value Date   CHOL 128 05/22/2020   Lab Results  Component Value Date   HDL 50 05/22/2020   Lab Results  Component Value Date   LDLCALC 62 05/22/2020   Lab Results  Component Value Date   TRIG 81 05/22/2020   Lab Results  Component Value Date   CHOLHDL 2.6 05/22/2020   Lab Results  Component Value Date   HGBA1C 5.3 11/09/2018       Assessment & Plan:  1. Laceration of right lower leg, follow up The wound in clean and granulating in.  We will get home health to help with dressing at home    Follow-up: Return in about 1 week (around 06/29/2020) for wound. give tetanus next visit  An After Visit Summary was printed and given to the patient.  07/01/2020 [facility] [phone]

## 2020-06-29 ENCOUNTER — Encounter: Payer: Self-pay | Admitting: Legal Medicine

## 2020-06-29 ENCOUNTER — Ambulatory Visit (INDEPENDENT_AMBULATORY_CARE_PROVIDER_SITE_OTHER): Payer: PPO | Admitting: Legal Medicine

## 2020-06-29 ENCOUNTER — Other Ambulatory Visit: Payer: Self-pay

## 2020-06-29 VITALS — BP 108/68 | HR 72 | Temp 97.6°F | Ht 63.0 in | Wt 107.0 lb

## 2020-06-29 DIAGNOSIS — S81811D Laceration without foreign body, right lower leg, subsequent encounter: Secondary | ICD-10-CM | POA: Diagnosis not present

## 2020-06-29 NOTE — Progress Notes (Signed)
Acute Office Visit  Subjective:    Patient ID: [name], female    DOB: August 19, 1926, 86 y.o.   MRN: [id number]  Chief Complaint  Patient presents with  . Wound Check     Patient is in today for for lage skin avulsion right lower leg by scraping on chair.  Accident happened on 06/06/2020.  She went to urgent care and wrapped.  She has large piece of necrotic skin 4 x 5 cm.  Laceration is 5 by 6 cm wide.  Follow up 06/22/2020, the wound is healing well.  Early granulation tissue. No need for debridement this time. Set up home health  nurse.  Follow up 06/29/2020, the wound is healing but slowly, I gave new instructions to change bandage every other day an due ess silvadine.    Past Medical History:  Diagnosis Date  . Acute ischemic stroke (HCC) 11/08/2018  . Adjustment disorder with depressed mood 10/16/2019  . Alteration consciousness 03/08/2020  . Apraxia following cerebrovascular accident 10/16/2019  . B12 deficiency 10/16/2019  . Body mass index (BMI) less than 19 02/16/2020  . Cerebrovascular accident (CVA) (HCC) 03/08/2020  . Chronic kidney disease, stage II (mild) 12/06/2019  . Closed fracture of one rib of right side with routine healing 06/30/2017  . Confusion   . COPD (chronic obstructive pulmonary disease) (HCC) 11/16/2017  . Current use of long term anticoagulation 07/21/2017  . Dementia, multiinfarct, with behavioral disturbance (HCC) 01/18/2020  . Depression   . Essential hypertension, benign 10/16/2019  . First degree atrioventricular block 06/26/2017  . Hammer toe 06/26/2017  . Hearing loss   . Hyperlipidemia 06/26/2017  . Malnutrition of moderate degree (HCC) 12/06/2019  . Memory loss   . Other specified cardiac arrhythmias 06/26/2017  . Pain in limb 06/26/2017  . Palpitations 06/26/2017  . Paroxysmal atrial fibrillation (HCC) 06/26/2017   Formatting of this note might be different from the original. Overview:  remote history post operatively, CHADS 2 score=2  Formatting of this note might be different from the original. remote history post operatively, CHADS 2 score=2  . Premature atrial complex 06/26/2017  . Premature ventricular contractions 06/26/2017  . PSVT (paroxysmal supraventricular tachycardia) (HCC) 11/10/2017  . 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 Stroke (HCC)   . Thoracic aorta atherosclerosis (HCC) 05/22/2020  . Traumatic pneumothorax 06/30/2017    Past Surgical History:  Procedure Laterality Date  . ABDOMINAL HYSTERECTOMY    . SHOULDER SURGERY    . TONSILLECTOMY      Family History  Problem Relation Age of Onset  . Bone cancer Mother   . Breast cancer Father   . Diabetes Father   . Heart disease Father   . Breast cancer Sister   . Dementia Brother   . Alzheimer's disease Sister     Social History   Socioeconomic History  . Marital status: Widowed    Spouse name: Not on file  . Number of children: 2  . Years of education: 35  . Highest education level: High school graduate  Occupational History  . Occupation: Retired  Tobacco Use  . Smoking status: Never Smoker  . Smokeless tobacco: Never Used  Vaping Use  . Vaping Use: Never used  Substance and Sexual Activity  . Alcohol use: No  . Drug use: No  . Sexual activity: Not Currently  Other Topics Concern  . Not on file  Social History Narrative   [name] is widowed. Her daughter, [name], is her  POA and lives 12 miles away. She resides in her own home, alone for last 7 years. Since her CVA she has had someone with her most of the time. She has a hired caregiver or a family member stays most of the day. She feel safe in her home and in her neighborhood.   Right-handed.   0.5 cups caffeine per day.   Social Determinants of Health   Financial Resource Strain:   . Difficulty of Paying Living Expenses: Not on file  Food Insecurity:   . Worried About Programme researcher, broadcasting/film/video in the Last Year: Not on file  . Ran Out of Food in the  Last Year: Not on file  Transportation Needs:   . Lack of Transportation (Medical): Not on file  . Lack of Transportation (Non-Medical): Not on file  Physical Activity:   . Days of Exercise per Week: Not on file  . Minutes of Exercise per Session: Not on file  Stress:   . Feeling of Stress : Not on file  Social Connections:   . Frequency of Communication with Friends and Family: Not on file  . Frequency of Social Gatherings with Friends and Family: Not on file  . Attends Religious Services: Not on file  . Active Member of Clubs or Organizations: Not on file  . Attends Banker Meetings: Not on file  . Marital Status: Not on file  Intimate Partner Violence:   . Fear of Current or Ex-Partner: Not on file  . Emotionally Abused: Not on file  . Physically Abused: Not on file  . Sexually Abused: Not on file    Outpatient Medications Prior to Visit  Medication Sig Dispense Refill  . acebutolol (SECTRAL) 200 MG capsule Take 1 capsule (200 mg total) by mouth daily. 90 capsule 4  . apixaban (ELIQUIS) 2.5 MG TABS tablet Take 1 tablet (2.5 mg total) by mouth 2 (two) times daily. 60 tablet 3  . Apoaequorin (PREVAGEN PO) Take 1 tablet by mouth daily.    [name] atorvastatin (LIPITOR) 40 MG tablet Take 1 tablet (40 mg total) by mouth daily. 90 tablet 2  . Cholecalciferol (VITAMIN D3) 1000 units CAPS Take 1,000 Units by mouth daily.    . clindamycin (CLEOCIN) 150 MG capsule Take 1 capsule (150 mg total) by mouth 3 (three) times daily. 30 capsule 0  . docusate sodium (STOOL SOFTENER) 100 MG capsule Take 100 mg by mouth daily.    [name] levETIRAcetam (KEPPRA) 250 MG tablet Take 1 tablet (250 mg total) by mouth 2 (two) times daily. 60 tablet 11  . mirtazapine (REMERON) 30 MG tablet Take 1 tablet (30 mg total) by mouth at bedtime. 90 tablet 1  . OVER THE COUNTER MEDICATION Take 1 tablet by mouth daily. Selenex GSH supplement    . prednisoLONE acetate (PRED FORTE) 1 % ophthalmic suspension SMARTSIG:1  Drop(s) Right Eye Twice Daily PRN    . silver sulfADIAZINE (SILVADENE) 1 % cream Apply 1 application topically daily. 50 g 2  . vitamin B-12 (CYANOCOBALAMIN) 500 MCG tablet Take 500 mcg by mouth daily.      No facility-administered medications prior to visit.    Allergies  Allergen Reactions  . Codeine Nausea And Vomiting  . Penicillins Rash  . Sulfa Antibiotics Nausea And Vomiting    Review of Systems  Constitutional: Negative.   HENT: Negative.   Respiratory: Negative for cough, choking, shortness of breath and stridor.   Cardiovascular: Negative for chest pain, palpitations and leg swelling.  Genitourinary: Negative.   Skin: Positive for wound (right lower leg).  Psychiatric/Behavioral: Negative.        Objective:    Physical Exam Vitals reviewed.  Constitutional:      Appearance: Normal appearance.  Cardiovascular:     Rate and Rhythm: Normal rate and regular rhythm.     Pulses: Normal pulses.     Heart sounds: Normal heart sounds.  Pulmonary:     Effort: Pulmonary effort is normal.     Breath sounds: Normal breath sounds.  Skin:         Comments: 5 by 6 cm stage 3 open wound no further necrotic tissue.  Early granulation of wound.  No infection, slow healing  Neurological:     Mental Status: She is alert.     BP 108/68 (BP Location: Left Arm, Patient Position: Sitting, Cuff Size: Normal)   Pulse 72   Temp 97.6 F (36.4 C) (Temporal)   Ht 5\' 3"  (1.6 m)   Wt 107 lb (48.5 kg)   LMP  (LMP Unknown)   SpO2 99%   BMI 18.95 kg/m  Wt Readings from Last 3 Encounters:  06/29/20 107 lb (48.5 kg)  06/22/20 107 lb 6.4 oz (48.7 kg)  06/18/20 108 lb (49 kg)    Health Maintenance Due  Topic Date Due  . TETANUS/TDAP  Never done  . DEXA SCAN  Never done  . PNA vac Low Risk Adult (2 of 2 - PCV13) 08/31/2019    There are no preventive care reminders to display for this patient.   Lab Results  Component Value Date   TSH 1.460 05/22/2020   Lab Results    Component Value Date   WBC 6.7 05/22/2020   HGB 12.3 05/22/2020   HCT 35.9 05/22/2020   MCV 100 (H) 05/22/2020   PLT 289 05/22/2020   Lab Results  Component Value Date   NA 139 05/22/2020   K 4.8 05/22/2020   CO2 24 05/22/2020   GLUCOSE 88 05/22/2020   BUN 13 05/22/2020   CREATININE 0.68 05/22/2020   BILITOT 0.7 05/22/2020   ALKPHOS 65 05/22/2020   AST 15 05/22/2020   ALT 11 05/22/2020   PROT 6.6 05/22/2020   ALBUMIN 4.3 05/22/2020   CALCIUM 9.2 05/22/2020   ANIONGAP 6 11/09/2018   Lab Results  Component Value Date   CHOL 128 05/22/2020   Lab Results  Component Value Date   HDL 50 05/22/2020   Lab Results  Component Value Date   LDLCALC 62 05/22/2020   Lab Results  Component Value Date   TRIG 81 05/22/2020   Lab Results  Component Value Date   CHOLHDL 2.6 05/22/2020   Lab Results  Component Value Date   HGBA1C 5.3 11/09/2018       Assessment & Plan:  1. Laceration of right lower leg, follow up The wound in clean and granulating in.  We will get home health to help with dressing at home    Follow-up: Return in about 1 week (around 07/06/2020). give tetanus next visit  An After Visit Summary was printed and given to the patient.  13/12/2019 [facility] [phone]

## 2020-07-01 ENCOUNTER — Encounter: Payer: Self-pay | Admitting: Legal Medicine

## 2020-07-06 ENCOUNTER — Other Ambulatory Visit: Payer: Self-pay

## 2020-07-06 ENCOUNTER — Ambulatory Visit (INDEPENDENT_AMBULATORY_CARE_PROVIDER_SITE_OTHER): Payer: PPO | Admitting: Legal Medicine

## 2020-07-06 ENCOUNTER — Encounter: Payer: Self-pay | Admitting: Legal Medicine

## 2020-07-06 VITALS — BP 102/60 | HR 68 | Temp 97.7°F | Resp 16 | Ht 63.0 in | Wt 104.0 lb

## 2020-07-06 DIAGNOSIS — S81811D Laceration without foreign body, right lower leg, subsequent encounter: Secondary | ICD-10-CM

## 2020-07-06 MED ORDER — CEFDINIR 300 MG PO CAPS: 300.0000 mg | ORAL_CAPSULE | Freq: Two times a day (BID) | ORAL | 0 refills | Status: DC

## 2020-07-06 NOTE — Progress Notes (Signed)
Acute Office Visit  Subjective:    Patient ID: [name], female    DOB: [birth year], 86 y.o.   MRN: [id number]  Chief Complaint  Patient presents with  . Wound Check    pt said wound is healing an doesn't hurt     Patient is in today for for lage skin avulsion right lower leg by scraping on chair.  Accident happened on 06/06/2020.  She went to urgent care and wrapped.  She has large piece of necrotic skin 4 x 5 cm.  Laceration is 5 by 6 cm wide.  Follow up 06/22/2020, the wound is healing well.  Early granulation tissue. No need for debridement this time. Set up home health  nurse.  Follow up 06/29/2020, the wound is healing but slowly, I gave new instructions to change bandage every other day an due ess silvadine.    Follow up 07/06/2020, the wound Is granulating in well, more shallow and good circulation  Past Medical History:  Diagnosis Date  . Acute ischemic stroke (HCC) 11/08/2018  . Adjustment disorder with depressed mood 10/16/2019  . Alteration consciousness 03/08/2020  . Apraxia following cerebrovascular accident 10/16/2019  . B12 deficiency 10/16/2019  . Body mass index (BMI) less than 19 02/16/2020  . Cerebrovascular accident (CVA) (HCC) 03/08/2020  . Chronic kidney disease, stage II (mild) 12/06/2019  . Closed fracture of one rib of right side with routine healing 06/30/2017  . Confusion   . COPD (chronic obstructive pulmonary disease) (HCC) 11/16/2017  . Current use of long term anticoagulation 07/21/2017  . Dementia, multiinfarct, with behavioral disturbance (HCC) 01/18/2020  . Depression   . Essential hypertension, benign 10/16/2019  . First degree atrioventricular block 06/26/2017  . Hammer toe 06/26/2017  . Hearing loss   . Hyperlipidemia 06/26/2017  . Malnutrition of moderate degree (HCC) 12/06/2019  . Memory loss   . Other specified cardiac arrhythmias 06/26/2017  . Pain in limb 06/26/2017  . Palpitations 06/26/2017  . Paroxysmal atrial fibrillation (HCC)  06/26/2017   Formatting of this note might be different from the original. Overview:  remote history post operatively, CHADS 2 score=2 Formatting of this note might be different from the original. remote history post operatively, CHADS 2 score=2  . Premature atrial complex 06/26/2017  . Premature ventricular contractions 06/26/2017  . PSVT (paroxysmal supraventricular tachycardia) (HCC) 11/10/2017  . 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 Stroke (HCC)   . Thoracic aorta atherosclerosis (HCC) 05/22/2020  . Traumatic pneumothorax 06/30/2017    Past Surgical History:  Procedure Laterality Date  . ABDOMINAL HYSTERECTOMY    . SHOULDER SURGERY    . TONSILLECTOMY      Family History  Problem Relation Age of Onset  . Bone cancer Mother   . Breast cancer Father   . Diabetes Father   . Heart disease Father   . Breast cancer Sister   . Dementia Brother   . Alzheimer's disease Sister     Social History   Socioeconomic History  . Marital status: Widowed    Spouse name: Not on file  . Number of children: 2  . Years of education: 58  . Highest education level: High school graduate  Occupational History  . Occupation: Retired  Tobacco Use  . Smoking status: Never Smoker  . Smokeless tobacco: Never Used  Vaping Use  . Vaping Use: Never used  Substance and Sexual Activity  . Alcohol use: No  . Drug use: No  . Sexual activity: Not  Currently  Other Topics Concern  . Not on file  Social History Narrative   [name] is widowed. Her daughter, [name], is her POA and lives 12 miles away. She resides in her own home, alone for last 7 years. Since her CVA she has had someone with her most of the time. She has a hired caregiver or a family member stays most of the day. She feel safe in her home and in her neighborhood.   Right-handed.   0.5 cups caffeine per day.   Social Determinants of Health   Financial Resource Strain:   . Difficulty of Paying Living  Expenses: Not on file  Food Insecurity:   . Worried About Programme researcher, broadcasting/film/video in the Last Year: Not on file  . Ran Out of Food in the Last Year: Not on file  Transportation Needs:   . Lack of Transportation (Medical): Not on file  . Lack of Transportation (Non-Medical): Not on file  Physical Activity:   . Days of Exercise per Week: Not on file  . Minutes of Exercise per Session: Not on file  Stress:   . Feeling of Stress : Not on file  Social Connections:   . Frequency of Communication with Friends and Family: Not on file  . Frequency of Social Gatherings with Friends and Family: Not on file  . Attends Religious Services: Not on file  . Active Member of Clubs or Organizations: Not on file  . Attends Banker Meetings: Not on file  . Marital Status: Not on file  Intimate Partner Violence:   . Fear of Current or Ex-Partner: Not on file  . Emotionally Abused: Not on file  . Physically Abused: Not on file  . Sexually Abused: Not on file    Outpatient Medications Prior to Visit  Medication Sig Dispense Refill  . acebutolol (SECTRAL) 200 MG capsule Take 1 capsule (200 mg total) by mouth daily. 90 capsule 4  . apixaban (ELIQUIS) 2.5 MG TABS tablet Take 1 tablet (2.5 mg total) by mouth 2 (two) times daily. 60 tablet 3  . Apoaequorin (PREVAGEN PO) Take 1 tablet by mouth daily.    [name] atorvastatin (LIPITOR) 40 MG tablet Take 1 tablet (40 mg total) by mouth daily. 90 tablet 2  . Cholecalciferol (VITAMIN D3) 1000 units CAPS Take 1,000 Units by mouth daily.    [name] docusate sodium (STOOL SOFTENER) 100 MG capsule Take 100 mg by mouth daily.    [name] levETIRAcetam (KEPPRA) 250 MG tablet Take 1 tablet (250 mg total) by mouth 2 (two) times daily. 60 tablet 11  . mirtazapine (REMERON) 30 MG tablet Take 1 tablet (30 mg total) by mouth at bedtime. 90 tablet 1  . OVER THE COUNTER MEDICATION Take 1 tablet by mouth daily. Selenex GSH supplement    . prednisoLONE acetate (PRED FORTE) 1 % ophthalmic  suspension SMARTSIG:1 Drop(s) Right Eye Twice Daily PRN    . silver sulfADIAZINE (SILVADENE) 1 % cream Apply 1 application topically daily. 50 g 2  . vitamin B-12 (CYANOCOBALAMIN) 500 MCG tablet Take 500 mcg by mouth daily.     . clindamycin (CLEOCIN) 150 MG capsule Take 1 capsule (150 mg total) by mouth 3 (three) times daily. 30 capsule 0   No facility-administered medications prior to visit.    Allergies  Allergen Reactions  . Codeine Nausea And Vomiting  . Penicillins Rash  . Sulfa Antibiotics Nausea And Vomiting    Review of Systems  Constitutional: Negative.  HENT: Negative.   Respiratory: Negative for cough, choking, shortness of breath and stridor.   Cardiovascular: Negative for chest pain, palpitations and leg swelling.  Genitourinary: Negative.   Skin: Positive for wound (right lower leg).  Psychiatric/Behavioral: Negative.        Objective:    Physical Exam Vitals reviewed.  Constitutional:      Appearance: Normal appearance.  HENT:     Left Ear: Tympanic membrane normal.     Ears:     Comments: Right TM has some redness in pars flaccida with small bulla Cardiovascular:     Rate and Rhythm: Normal rate and regular rhythm.     Pulses: Normal pulses.     Heart sounds: Normal heart sounds.  Pulmonary:     Effort: Pulmonary effort is normal.     Breath sounds: Normal breath sounds.  Skin:         Comments: 4 by 5 cm stage 2 open wound no further necrotic tissue.  Early granulation of wound.  No infection, slow healing  Neurological:     Mental Status: She is alert.     BP 102/60   Pulse 68   Temp 97.7 F (36.5 C)   Resp 16   Ht 5\' 3"  (1.6 m)   Wt 104 lb (47.2 kg)   LMP  (LMP Unknown)   SpO2 99%   BMI 18.42 kg/m  Wt Readings from Last 3 Encounters:  07/06/20 104 lb (47.2 kg)  06/29/20 107 lb (48.5 kg)  06/22/20 107 lb 6.4 oz (48.7 kg)    Health Maintenance Due  Topic Date Due  . DEXA SCAN  Never done  . PNA vac Low Risk Adult (2 of 2 -  PCV13) 08/31/2019    There are no preventive care reminders to display for this patient.   Lab Results  Component Value Date   TSH 1.460 05/22/2020   Lab Results  Component Value Date   WBC 6.7 05/22/2020   HGB 12.3 05/22/2020   HCT 35.9 05/22/2020   MCV 100 (H) 05/22/2020   PLT 289 05/22/2020   Lab Results  Component Value Date   NA 139 05/22/2020   K 4.8 05/22/2020   CO2 24 05/22/2020   GLUCOSE 88 05/22/2020   BUN 13 05/22/2020   CREATININE 0.68 05/22/2020   BILITOT 0.7 05/22/2020   ALKPHOS 65 05/22/2020   AST 15 05/22/2020   ALT 11 05/22/2020   PROT 6.6 05/22/2020   ALBUMIN 4.3 05/22/2020   CALCIUM 9.2 05/22/2020   ANIONGAP 6 11/09/2018   Lab Results  Component Value Date   CHOL 128 05/22/2020   Lab Results  Component Value Date   HDL 50 05/22/2020   Lab Results  Component Value Date   LDLCALC 62 05/22/2020   Lab Results  Component Value Date   TRIG 81 05/22/2020   Lab Results  Component Value Date   CHOLHDL 2.6 05/22/2020   Lab Results  Component Value Date   HGBA1C 5.3 11/09/2018       Assessment & Plan:  1. Laceration of right lower leg, follow up The wound in clean and granulating in.  We will get home health to help with dressing at home    Follow-up: for 2 weeks  An After Visit Summary was printed and given to the patient.  01/09/2019 [facility] [phone]

## 2020-07-15 ENCOUNTER — Encounter: Payer: Self-pay | Admitting: Legal Medicine

## 2020-07-16 ENCOUNTER — Encounter: Payer: Self-pay | Admitting: Neurology

## 2020-07-16 ENCOUNTER — Ambulatory Visit: Payer: PPO | Admitting: Neurology

## 2020-07-16 VITALS — BP 138/75 | HR 71 | Ht 63.0 in | Wt 107.5 lb

## 2020-07-16 DIAGNOSIS — I639 Cerebral infarction, unspecified: Secondary | ICD-10-CM | POA: Diagnosis not present

## 2020-07-16 DIAGNOSIS — G40209 Localization-related (focal) (partial) symptomatic epilepsy and epileptic syndromes with complex partial seizures, not intractable, without status epilepticus: Secondary | ICD-10-CM | POA: Diagnosis not present

## 2020-07-16 MED ORDER — LEVETIRACETAM 250 MG PO TABS
250.0000 mg | ORAL_TABLET | Freq: Two times a day (BID) | ORAL | 4 refills | Status: DC
Start: 2020-07-16 — End: 2020-09-24
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
## 2020-07-18 ENCOUNTER — Other Ambulatory Visit: Payer: Self-pay | Admitting: Cardiology

## 2020-07-20 ENCOUNTER — Ambulatory Visit (INDEPENDENT_AMBULATORY_CARE_PROVIDER_SITE_OTHER): Payer: PPO | Admitting: Legal Medicine

## 2020-07-20 ENCOUNTER — Encounter: Payer: Self-pay | Admitting: Legal Medicine

## 2020-07-20 ENCOUNTER — Other Ambulatory Visit: Payer: Self-pay

## 2020-07-20 VITALS — BP 118/70 | HR 67 | Temp 96.6°F | Resp 16 | Ht 63.0 in | Wt 108.0 lb

## 2020-07-20 DIAGNOSIS — S81811D Laceration without foreign body, right lower leg, subsequent encounter: Secondary | ICD-10-CM | POA: Diagnosis not present

## 2020-07-20 NOTE — Progress Notes (Signed)
Acute Office Visit  Subjective:    Patient ID: [name], female    DOB: 09-22-1925, 86 y.o.   MRN: [id number]  Chief Complaint  Patient presents with  . Skin Ulcer    R leg     Patient is in today for for lage skin avulsion right lower leg by scraping on chair.  Accident happened on 06/06/2020.  She went to urgent care and wrapped.  She has large piece of necrotic skin 4 x 5 cm.  Laceration is 5 by 6 cm wide.  Follow up 06/22/2020, the wound is healing well.  Early granulation tissue. No need for debridement this time. Set up home health  nurse.  Follow up 06/29/2020, the wound is healing but slowly, I gave new instructions to change bandage every other day an due ess silvadine.    Follow up 07/06/2020, the wound Is granulating in well, more shallow and good circulation  Follow up 07/20/2020.  The wound is granulating in well and is 1/2 the size, no infection  Past Medical History:  Diagnosis Date  . Acute ischemic stroke (HCC) 11/08/2018  . Adjustment disorder with depressed mood 10/16/2019  . Alteration consciousness 03/08/2020  . Apraxia following cerebrovascular accident 10/16/2019  . B12 deficiency 10/16/2019  . Body mass index (BMI) less than 19 02/16/2020  . Cerebrovascular accident (CVA) (HCC) 03/08/2020  . Chronic kidney disease, stage II (mild) 12/06/2019  . Closed fracture of one rib of right side with routine healing 06/30/2017  . Confusion   . COPD (chronic obstructive pulmonary disease) (HCC) 11/16/2017  . Current use of long term anticoagulation 07/21/2017  . Dementia, multiinfarct, with behavioral disturbance (HCC) 01/18/2020  . Depression   . Essential hypertension, benign 10/16/2019  . First degree atrioventricular block 06/26/2017  . Hammer toe 06/26/2017  . Hearing loss   . Hyperlipidemia 06/26/2017  . Malnutrition of moderate degree (HCC) 12/06/2019  . Memory loss   . Other specified cardiac arrhythmias 06/26/2017  . Pain in limb 06/26/2017  . Palpitations  06/26/2017  . Paroxysmal atrial fibrillation (HCC) 06/26/2017   Formatting of this note might be different from the original. Overview:  remote history post operatively, CHADS 2 score=2 Formatting of this note might be different from the original. remote history post operatively, CHADS 2 score=2  . Premature atrial complex 06/26/2017  . Premature ventricular contractions 06/26/2017  . PSVT (paroxysmal supraventricular tachycardia) (HCC) 11/10/2017  . 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 Stroke (HCC)   . Thoracic aorta atherosclerosis (HCC) 05/22/2020  . Traumatic pneumothorax 06/30/2017    Past Surgical History:  Procedure Laterality Date  . ABDOMINAL HYSTERECTOMY    . SHOULDER SURGERY    . TONSILLECTOMY      Family History  Problem Relation Age of Onset  . Bone cancer Mother   . Breast cancer Father   . Diabetes Father   . Heart disease Father   . Breast cancer Sister   . Dementia Brother   . Alzheimer's disease Sister     Social History   Socioeconomic History  . Marital status: Widowed    Spouse name: Not on file  . Number of children: 2  . Years of education: 19  . Highest education level: High school graduate  Occupational History  . Occupation: Retired  Tobacco Use  . Smoking status: Never Smoker  . Smokeless tobacco: Never Used  Vaping Use  . Vaping Use: Never used  Substance and Sexual Activity  . Alcohol  use: No  . Drug use: No  . Sexual activity: Not Currently  Other Topics Concern  . Not on file  Social History Narrative   Mrs. Lynam is widowed. Her daughter, [name], is her POA and lives 12 miles away. She resides in her own home, alone for last 7 years. Since her CVA she has had someone with her most of the time. She has a hired caregiver or a family member stays most of the day. She feel safe in her home and in her neighborhood.   Right-handed.   0.5 cups caffeine per day.   Social Determinants of Health   Financial  Resource Strain:   . Difficulty of Paying Living Expenses: Not on file  Food Insecurity:   . Worried About Programme researcher, broadcasting/film/video in the Last Year: Not on file  . Ran Out of Food in the Last Year: Not on file  Transportation Needs:   . Lack of Transportation (Medical): Not on file  . Lack of Transportation (Non-Medical): Not on file  Physical Activity:   . Days of Exercise per Week: Not on file  . Minutes of Exercise per Session: Not on file  Stress:   . Feeling of Stress : Not on file  Social Connections:   . Frequency of Communication with Friends and Family: Not on file  . Frequency of Social Gatherings with Friends and Family: Not on file  . Attends Religious Services: Not on file  . Active Member of Clubs or Organizations: Not on file  . Attends Banker Meetings: Not on file  . Marital Status: Not on file  Intimate Partner Violence:   . Fear of Current or Ex-Partner: Not on file  . Emotionally Abused: Not on file  . Physically Abused: Not on file  . Sexually Abused: Not on file    Outpatient Medications Prior to Visit  Medication Sig Dispense Refill  . acebutolol (SECTRAL) 200 MG capsule Take 1 capsule (200 mg total) by mouth daily. 90 capsule 4  . Apoaequorin (PREVAGEN PO) Take 1 tablet by mouth daily.    [name] atorvastatin (LIPITOR) 40 MG tablet Take 1 tablet (40 mg total) by mouth daily. 90 tablet 2  . cefdinir (OMNICEF) 300 MG capsule Take 1 capsule (300 mg total) by mouth 2 (two) times daily. 14 capsule 0  . Cholecalciferol (VITAMIN D3) 1000 units CAPS Take 1,000 Units by mouth daily.    [name] docusate sodium (STOOL SOFTENER) 100 MG capsule Take 100 mg by mouth daily.    [name] ELIQUIS 2.5 MG TABS tablet TAKE 1 TABLET(2.5 MG) BY MOUTH TWICE DAILY 60 tablet 3  . levETIRAcetam (KEPPRA) 250 MG tablet Take 1 tablet (250 mg total) by mouth 2 (two) times daily. 180 tablet 4  . mirtazapine (REMERON) 30 MG tablet Take 1 tablet (30 mg total) by mouth at bedtime. 90 tablet 1  .  OVER THE COUNTER MEDICATION Take 1 tablet by mouth daily. Selenex GSH supplement    . prednisoLONE acetate (PRED FORTE) 1 % ophthalmic suspension SMARTSIG:1 Drop(s) Right Eye Twice Daily PRN    . silver sulfADIAZINE (SILVADENE) 1 % cream Apply 1 application topically daily. 50 g 2  . vitamin B-12 (CYANOCOBALAMIN) 500 MCG tablet Take 500 mcg by mouth daily.      No facility-administered medications prior to visit.    Allergies  Allergen Reactions  . Codeine Nausea And Vomiting  . Penicillins Rash  . Sulfa Antibiotics Nausea And Vomiting  Review of Systems  Constitutional: Negative.   HENT: Negative.   Respiratory: Negative for cough, choking, shortness of breath and stridor.   Cardiovascular: Negative for chest pain, palpitations and leg swelling.  Genitourinary: Negative.   Skin: Positive for wound (right lower leg healing well).  Psychiatric/Behavioral: Negative.        Objective:    Physical Exam Vitals reviewed.  Constitutional:      Appearance: Normal appearance.  Cardiovascular:     Rate and Rhythm: Normal rate and regular rhythm.     Pulses: Normal pulses.     Heart sounds: Normal heart sounds.  Pulmonary:     Effort: Pulmonary effort is normal.     Breath sounds: Normal breath sounds.  Skin:         Comments: 3 x 2 cm stage1-2 2 open wound .  Good granulation tissue now 1/2 size, the other ulcer healed  Neurological:     Mental Status: She is alert.     BP 118/70 (BP Location: Left Arm, Patient Position: Sitting, Cuff Size: Normal)   Pulse 67   Temp (!) 96.6 F (35.9 C) (Temporal)   Resp 16   Ht 5\' 3"  (1.6 m)   Wt 108 lb (49 kg)   LMP  (LMP Unknown)   SpO2 97%   BMI 19.13 kg/m  Wt Readings from Last 3 Encounters:  07/20/20 108 lb (49 kg)  07/16/20 107 lb 8 oz (48.8 kg)  07/06/20 104 lb (47.2 kg)    Health Maintenance Due  Topic Date Due  . DEXA SCAN  Never done  . PNA vac Low Risk Adult (2 of 2 - PCV13) 08/31/2019    There are no  preventive care reminders to display for this patient.   Lab Results  Component Value Date   TSH 1.460 05/22/2020   Lab Results  Component Value Date   WBC 6.7 05/22/2020   HGB 12.3 05/22/2020   HCT 35.9 05/22/2020   MCV 100 (H) 05/22/2020   PLT 289 05/22/2020   Lab Results  Component Value Date   NA 139 05/22/2020   K 4.8 05/22/2020   CO2 24 05/22/2020   GLUCOSE 88 05/22/2020   BUN 13 05/22/2020   CREATININE 0.68 05/22/2020   BILITOT 0.7 05/22/2020   ALKPHOS 65 05/22/2020   AST 15 05/22/2020   ALT 11 05/22/2020   PROT 6.6 05/22/2020   ALBUMIN 4.3 05/22/2020   CALCIUM 9.2 05/22/2020   ANIONGAP 6 11/09/2018   Lab Results  Component Value Date   CHOL 128 05/22/2020   Lab Results  Component Value Date   HDL 50 05/22/2020   Lab Results  Component Value Date   LDLCALC 62 05/22/2020   Lab Results  Component Value Date   TRIG 81 05/22/2020   Lab Results  Component Value Date   CHOLHDL 2.6 05/22/2020   Lab Results  Component Value Date   HGBA1C 5.3 11/09/2018       Assessment & Plan:  1. Laceration of right lower leg, follow up The wound in clean and granulating in.  It is 1/2 healed on right leg and doing well.  Continue dressing   Follow-up: for 2 weeks  An After Visit Summary was printed and given to the patient.  01/09/2019 [facility] [phone]

## 2020-08-03 ENCOUNTER — Ambulatory Visit (INDEPENDENT_AMBULATORY_CARE_PROVIDER_SITE_OTHER): Payer: PPO | Admitting: Legal Medicine

## 2020-08-03 ENCOUNTER — Encounter: Payer: Self-pay | Admitting: Legal Medicine

## 2020-08-03 ENCOUNTER — Other Ambulatory Visit: Payer: Self-pay

## 2020-08-03 VITALS — BP 118/66 | HR 68 | Temp 97.8°F | Resp 16 | Ht 63.0 in | Wt 106.6 lb

## 2020-08-03 DIAGNOSIS — S81811D Laceration without foreign body, right lower leg, subsequent encounter: Secondary | ICD-10-CM

## 2020-08-03 NOTE — Progress Notes (Signed)
Subjective:  Patient ID: [name], female    DOB: 1926-07-15  Age: 86 y.o. MRN: [id number]  Chief Complaint  Patient presents with   Skin Ulcer    HPI: patient continues to have ulcer on right lower leg posteriorly, It is now superficial and 1 x 3 cm with good granulation tissue. Daughter is dressing wounds.   Current Outpatient Medications on File Prior to Visit  Medication Sig Dispense Refill   acebutolol (SECTRAL) 200 MG capsule Take 1 capsule (200 mg total) by mouth daily. 90 capsule 4   Apoaequorin (PREVAGEN PO) Take 1 tablet by mouth daily.     atorvastatin (LIPITOR) 40 MG tablet Take 1 tablet (40 mg total) by mouth daily. 90 tablet 2   cefdinir (OMNICEF) 300 MG capsule Take 1 capsule (300 mg total) by mouth 2 (two) times daily. 14 capsule 0   Cholecalciferol (VITAMIN D3) 1000 units CAPS Take 1,000 Units by mouth daily.     docusate sodium (STOOL SOFTENER) 100 MG capsule Take 100 mg by mouth daily.     ELIQUIS 2.5 MG TABS tablet TAKE 1 TABLET(2.5 MG) BY MOUTH TWICE DAILY 60 tablet 3   levETIRAcetam (KEPPRA) 250 MG tablet Take 1 tablet (250 mg total) by mouth 2 (two) times daily. 180 tablet 4   mirtazapine (REMERON) 30 MG tablet Take 1 tablet (30 mg total) by mouth at bedtime. 90 tablet 1   OVER THE COUNTER MEDICATION Take 1 tablet by mouth daily. Selenex GSH supplement     prednisoLONE acetate (PRED FORTE) 1 % ophthalmic suspension SMARTSIG:1 Drop(s) Right Eye Twice Daily PRN     silver sulfADIAZINE (SILVADENE) 1 % cream Apply 1 application topically daily. 50 g 2   vitamin B-12 (CYANOCOBALAMIN) 500 MCG tablet Take 500 mcg by mouth daily.      No current facility-administered medications on file prior to visit.   Past Medical History:  Diagnosis Date   Acute ischemic stroke (HCC) 11/08/2018   Adjustment disorder with depressed mood 10/16/2019   Alteration consciousness 03/08/2020   Apraxia following cerebrovascular accident 10/16/2019   B12 deficiency  10/16/2019   Body mass index (BMI) less than 19 02/16/2020   Cerebrovascular accident (CVA) (HCC) 03/08/2020   Chronic kidney disease, stage II (mild) 12/06/2019   Closed fracture of one rib of right side with routine healing 06/30/2017   Confusion    COPD (chronic obstructive pulmonary disease) (HCC) 11/16/2017   Current use of long term anticoagulation 07/21/2017   Dementia, multiinfarct, with behavioral disturbance (HCC) 01/18/2020   Depression    Essential hypertension, benign 10/16/2019   First degree atrioventricular block 06/26/2017   Hammer toe 06/26/2017   Hearing loss    Hyperlipidemia 06/26/2017   Malnutrition of moderate degree (HCC) 12/06/2019   Memory loss    Other specified cardiac arrhythmias 06/26/2017   Pain in limb 06/26/2017   Palpitations 06/26/2017   Paroxysmal atrial fibrillation (HCC) 06/26/2017   Formatting of this note might be different from the original. Overview:  remote history post operatively, CHADS 2 score=2 Formatting of this note might be different from the original. 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 10/16/2019   Stroke [facility])    Thoracic aorta atherosclerosis (HCC) 05/22/2020   Traumatic pneumothorax 06/30/2017   Past Surgical History:  Procedure Laterality Date   ABDOMINAL HYSTERECTOMY  SHOULDER SURGERY     TONSILLECTOMY      Family History  Problem Relation Age of Onset   Bone cancer Mother    Breast cancer Father    Diabetes Father    Heart disease Father    Breast cancer Sister    Dementia Brother    Alzheimer's disease Sister    Social History   Socioeconomic History   Marital status: Widowed    Spouse name: Not on file   Number of children: 2   Years of education: 12   Highest  education level: High school graduate  Occupational History   Occupation: Retired  Tobacco Use   Smoking status: Never Smoker   Smokeless tobacco: Never Used  Building services engineer Use: Never used  Substance and Sexual Activity   Alcohol use: No   Drug use: No   Sexual activity: Not Currently  Other Topics Concern   Not on file  Social History Narrative   [name] is widowed. Her daughter, [name], is her POA and lives 12 miles away. She resides in her own home, alone for last 7 years. Since her CVA she has had someone with her most of the time. She has a hired caregiver or a family member stays most of the day. She feel safe in her home and in her neighborhood.   Right-handed.   0.5 cups caffeine per day.   Social Determinants of Health   Financial Resource Strain:    Difficulty of Paying Living Expenses: Not on file  Food Insecurity:    Worried About Programme researcher, broadcasting/film/video in the Last Year: Not on file   [employer] of Food in the Last Year: Not on file  Transportation Needs:    Lack of Transportation (Medical): Not on file   Lack of Transportation (Non-Medical): Not on file  Physical Activity:    Days of Exercise per Week: Not on file   Minutes of Exercise per Session: Not on file  Stress:    Feeling of Stress : Not on file  Social Connections:    Frequency of Communication with Friends and Family: Not on file   Frequency of Social Gatherings with Friends and Family: Not on file   Attends Religious Services: Not on file   Active Member of Clubs or Organizations: Not on file   Attends Banker Meetings: Not on file   Marital Status: Not on file    Review of Systems  Constitutional: Negative for activity change and appetite change.  HENT: Positive for sinus pain. Negative for congestion.   Respiratory: Positive for cough and chest tightness. Negative for shortness of breath.   Cardiovascular: Positive for chest pain. Negative for  palpitations and leg swelling.  Skin: Positive for wound (getting smaller).     Objective:  BP 118/66 (BP Location: Left Arm, Patient Position: Sitting, Cuff Size: Normal)    Pulse 68    Temp 97.8 F (36.6 C) (Temporal)    Resp 16    Ht 5\' 3"  (1.6 m)    Wt 106 lb 9.6 oz (48.4 kg)    LMP  (LMP Unknown)    SpO2 96%    BMI 18.88 kg/m   BP/Weight 08/03/2020 07/20/2020 07/16/2020  Systolic BP 118 118 138  Diastolic BP 66 70 75  Wt. (Lbs) 106.6 108 107.5  BMI 18.88 19.13 19.04    Physical Exam Constitutional:      Appearance: Normal appearance.  Cardiovascular:  Rate and Rhythm: Normal rate and regular rhythm.     Pulses: Normal pulses.     Heart sounds: Normal heart sounds.  Pulmonary:     Effort: Pulmonary effort is normal.     Breath sounds: Normal breath sounds.  Skin:    Comments: Patient remains with one 1 by 3 cm ulcer.  The rest has granulated in well.  No infection.  Neurological:     Mental Status: She is alert.       Lab Results  Component Value Date   WBC 6.7 05/22/2020   HGB 12.3 05/22/2020   HCT 35.9 05/22/2020   PLT 289 05/22/2020   GLUCOSE 88 05/22/2020   CHOL 128 05/22/2020   TRIG 81 05/22/2020   HDL 50 05/22/2020   LDLCALC 62 05/22/2020   ALT 11 05/22/2020   AST 15 05/22/2020   NA 139 05/22/2020   K 4.8 05/22/2020   CL 104 05/22/2020   CREATININE 0.68 05/22/2020   BUN 13 05/22/2020   CO2 24 05/22/2020   TSH 1.460 05/22/2020   HGBA1C 5.3 11/09/2018      Assessment & Plan:   1. Laceration of right lower leg, subsequent encounter  The skin tear/laceration continues to heal slowly and is just a fraction of the original size.  Continue dressings          Follow-up: Return in about 2 weeks (around 08/17/2020).  An After Visit Summary was printed and given to the patient.  Brent Bulla, MD [facility] [phone]

## 2020-08-15 ENCOUNTER — Other Ambulatory Visit: Payer: Self-pay | Admitting: Legal Medicine

## 2020-08-15 DIAGNOSIS — E782 Mixed hyperlipidemia: Secondary | ICD-10-CM

## 2020-08-17 ENCOUNTER — Other Ambulatory Visit: Payer: Self-pay

## 2020-08-17 ENCOUNTER — Ambulatory Visit (INDEPENDENT_AMBULATORY_CARE_PROVIDER_SITE_OTHER): Payer: PPO | Admitting: Legal Medicine

## 2020-08-17 ENCOUNTER — Encounter: Payer: Self-pay | Admitting: Legal Medicine

## 2020-08-17 VITALS — BP 108/62 | HR 76 | Temp 97.3°F | Ht 63.0 in | Wt 108.0 lb

## 2020-08-17 DIAGNOSIS — S81811D Laceration without foreign body, right lower leg, subsequent encounter: Secondary | ICD-10-CM

## 2020-08-17 NOTE — Progress Notes (Signed)
Subjective:  Patient ID: [name], female    DOB: 04-05-1926  Age: 86 y.o. MRN: [id number]  Chief Complaint  Patient presents with  . leg wound    HPI: patient continues to have ulcer on right lower leg posteriorly, It is now superficial and 1 x 2 cm with good granulation tissue. Daughter is dressing wounds. It is getting smaller and starting to scab over.   Current Outpatient Medications on File Prior to Visit  Medication Sig Dispense Refill  . acebutolol (SECTRAL) 200 MG capsule Take 1 capsule (200 mg total) by mouth daily. 90 capsule 4  . Apoaequorin (PREVAGEN PO) Take 1 tablet by mouth daily.    [name] atorvastatin (LIPITOR) 40 MG tablet TAKE 1 TABLET(40 MG) BY MOUTH DAILY 90 tablet 2  . cefdinir (OMNICEF) 300 MG capsule Take 1 capsule (300 mg total) by mouth 2 (two) times daily. 14 capsule 0  . Cholecalciferol (VITAMIN D3) 1000 units CAPS Take 1,000 Units by mouth daily.    [name] docusate sodium (COLACE) 100 MG capsule Take 100 mg by mouth daily.    [name] ELIQUIS 2.5 MG TABS tablet TAKE 1 TABLET(2.5 MG) BY MOUTH TWICE DAILY 60 tablet 3  . levETIRAcetam (KEPPRA) 250 MG tablet Take 1 tablet (250 mg total) by mouth 2 (two) times daily. 180 tablet 4  . mirtazapine (REMERON) 30 MG tablet Take 1 tablet (30 mg total) by mouth at bedtime. 90 tablet 1  . OVER THE COUNTER MEDICATION Take 1 tablet by mouth daily. Selenex GSH supplement    . prednisoLONE acetate (PRED FORTE) 1 % ophthalmic suspension SMARTSIG:1 Drop(s) Right Eye Twice Daily PRN    . silver sulfADIAZINE (SILVADENE) 1 % cream Apply 1 application topically daily. 50 g 2  . vitamin B-12 (CYANOCOBALAMIN) 500 MCG tablet Take 500 mcg by mouth daily.      No current facility-administered medications on file prior to visit.   Past Medical History:  Diagnosis Date  . Acute ischemic stroke (HCC) 11/08/2018  . Adjustment disorder with depressed mood 10/16/2019  . Alteration consciousness 03/08/2020  . Apraxia following cerebrovascular accident  10/16/2019  . B12 deficiency 10/16/2019  . Body mass index (BMI) less than 19 02/16/2020  . Cerebrovascular accident (CVA) (HCC) 03/08/2020  . Chronic kidney disease, stage II (mild) 12/06/2019  . Closed fracture of one rib of right side with routine healing 06/30/2017  . Confusion   . COPD (chronic obstructive pulmonary disease) (HCC) 11/16/2017  . Current use of long term anticoagulation 07/21/2017  . Dementia, multiinfarct, with behavioral disturbance (HCC) 01/18/2020  . Depression   . Essential hypertension, benign 10/16/2019  . First degree atrioventricular block 06/26/2017  . Hammer toe 06/26/2017  . Hearing loss   . Hyperlipidemia 06/26/2017  . Malnutrition of moderate degree (HCC) 12/06/2019  . Memory loss   . Other specified cardiac arrhythmias 06/26/2017  . Pain in limb 06/26/2017  . Palpitations 06/26/2017  . Paroxysmal atrial fibrillation (HCC) 06/26/2017   Formatting of this note might be different from the original. Overview:  remote history post operatively, CHADS 2 score=2 Formatting of this note might be different from the original. remote history post operatively, CHADS 2 score=2  . Premature atrial complex 06/26/2017  . Premature ventricular contractions 06/26/2017  . PSVT (paroxysmal supraventricular tachycardia) (HCC) 11/10/2017  . 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 Stroke (HCC)   . Thoracic aorta atherosclerosis (HCC) 05/22/2020  . Traumatic pneumothorax 06/30/2017   Past Surgical History:  Procedure Laterality  Date  . ABDOMINAL HYSTERECTOMY    . SHOULDER SURGERY    . TONSILLECTOMY      Family History  Problem Relation Age of Onset  . Bone cancer Mother   . Breast cancer Father   . Diabetes Father   . Heart disease Father   . Breast cancer Sister   . Dementia Brother   . Alzheimer's disease Sister    Social History   Socioeconomic History  . Marital status: Widowed    Spouse name: Not on file  . Number of children: 2  . Years of  education: 25  . Highest education level: High school graduate  Occupational History  . Occupation: Retired  Tobacco Use  . Smoking status: Never Smoker  . Smokeless tobacco: Never Used  Vaping Use  . Vaping Use: Never used  Substance and Sexual Activity  . Alcohol use: No  . Drug use: No  . Sexual activity: Not Currently  Other Topics Concern  . Not on file  Social History Narrative   [name] is widowed. Her daughter, [name], is her POA and lives 12 miles away. She resides in her own home, alone for last 7 years. Since her CVA she has had someone with her most of the time. She has a hired caregiver or a family member stays most of the day. She feel safe in her home and in her neighborhood.   Right-handed.   0.5 cups caffeine per day.   Social Determinants of Health   Financial Resource Strain: Not on file  Food Insecurity: Not on file  Transportation Needs: Not on file  Physical Activity: Not on file  Stress: Not on file  Social Connections: Not on file    Review of Systems  Constitutional: Negative for activity change and appetite change.  HENT: Positive for sinus pain. Negative for congestion.   Respiratory: Positive for cough and chest tightness. Negative for shortness of breath.   Cardiovascular: Positive for chest pain. Negative for palpitations and leg swelling.  Skin: Positive for wound (getting smaller).     Objective:  BP 108/62 (BP Location: Left Arm, Patient Position: Sitting, Cuff Size: Small)   Pulse 76   Temp (!) 97.3 F (36.3 C) (Temporal)   Ht 5\' 3"  (1.6 m)   Wt 108 lb (49 kg)   LMP  (LMP Unknown)   SpO2 98%   BMI 19.13 kg/m   BP/Weight 08/17/2020 08/03/2020 07/20/2020  Systolic BP 108 118 118  Diastolic BP 62 66 70  Wt. (Lbs) 108 106.6 108  BMI 19.13 18.88 19.13    Physical Exam Constitutional:      Appearance: Normal appearance.  Cardiovascular:     Rate and Rhythm: Normal rate and regular rhythm.     Pulses: Normal pulses.      Heart sounds: Normal heart sounds.  Pulmonary:     Effort: Pulmonary effort is normal.     Breath sounds: Normal breath sounds.  Skin:    Comments: Patient remains with one 1 by 2 cm ulcer.  The rest has granulated in well.  No infection.  Neurological:     Mental Status: She is alert.       Lab Results  Component Value Date   WBC 6.7 05/22/2020   HGB 12.3 05/22/2020   HCT 35.9 05/22/2020   PLT 289 05/22/2020   GLUCOSE 88 05/22/2020   CHOL 128 05/22/2020   TRIG 81 05/22/2020   HDL 50 05/22/2020   LDLCALC 62 05/22/2020  ALT 11 05/22/2020   AST 15 05/22/2020   NA 139 05/22/2020   K 4.8 05/22/2020   CL 104 05/22/2020   CREATININE 0.68 05/22/2020   BUN 13 05/22/2020   CO2 24 05/22/2020   TSH 1.460 05/22/2020   HGBA1C 5.3 11/09/2018      Assessment & Plan:  Diagnoses and all orders for this visit: Laceration of right lower leg, subsequent encounter laceration right leg is healing well and about 1.4 original size with good granulation.  Area dress and change every 2 days.           Follow-up: Return in about 2 weeks (around 08/31/2020).  An After Visit Summary was printed and given to the patient.  Brent Bulla, MD [facility] [phone]

## 2020-08-28 MED ORDER — APIXABAN 2.5 MG PO TABS
ORAL_TABLET | ORAL | 3 refills | Status: DC
Start: 2020-08-28 — End: 2021-06-18

## 2020-09-04 ENCOUNTER — Other Ambulatory Visit: Payer: Self-pay

## 2020-09-04 ENCOUNTER — Encounter: Payer: Self-pay | Admitting: Legal Medicine

## 2020-09-04 ENCOUNTER — Ambulatory Visit (INDEPENDENT_AMBULATORY_CARE_PROVIDER_SITE_OTHER): Payer: PPO | Admitting: Legal Medicine

## 2020-09-04 VITALS — BP 116/60 | HR 67 | Temp 97.4°F | Resp 17 | Ht 63.0 in | Wt 108.0 lb

## 2020-09-04 DIAGNOSIS — S81811D Laceration without foreign body, right lower leg, subsequent encounter: Secondary | ICD-10-CM | POA: Diagnosis not present

## 2020-09-04 NOTE — Progress Notes (Signed)
Subjective:  Patient ID: [name], female    DOB: [birth year]  Age: 87 y.o. MRN: [id number]  Chief Complaint  Patient presents with  . leg wound   Follow up on wound HPI: patient continues to have ulcer on right lower leg posteriorly, It is now superficial and is scabbed over  Daughter is dressing wounds. It is getting smaller and has scab over.   Current Outpatient Medications on File Prior to Visit  Medication Sig Dispense Refill  . acebutolol (SECTRAL) 200 MG capsule Take 1 capsule (200 mg total) by mouth daily. 90 capsule 4  . Apoaequorin (PREVAGEN PO) Take 1 tablet by mouth daily.    [name] atorvastatin (LIPITOR) 40 MG tablet TAKE 1 TABLET(40 MG) BY MOUTH DAILY 90 tablet 2  . cefdinir (OMNICEF) 300 MG capsule Take 1 capsule (300 mg total) by mouth 2 (two) times daily. 14 capsule 0  . Cholecalciferol (VITAMIN D3) 1000 units CAPS Take 1,000 Units by mouth daily.    [name] docusate sodium (COLACE) 100 MG capsule Take 100 mg by mouth daily.    [name] ELIQUIS 2.5 MG TABS tablet TAKE 1 TABLET(2.5 MG) BY MOUTH TWICE DAILY 60 tablet 3  . levETIRAcetam (KEPPRA) 250 MG tablet Take 1 tablet (250 mg total) by mouth 2 (two) times daily. 180 tablet 4  . mirtazapine (REMERON) 30 MG tablet Take 1 tablet (30 mg total) by mouth at bedtime. 90 tablet 1  . OVER THE COUNTER MEDICATION Take 1 tablet by mouth daily. Selenex GSH supplement    . prednisoLONE acetate (PRED FORTE) 1 % ophthalmic suspension SMARTSIG:1 Drop(s) Right Eye Twice Daily PRN    . silver sulfADIAZINE (SILVADENE) 1 % cream Apply 1 application topically daily. 50 g 2  . vitamin B-12 (CYANOCOBALAMIN) 500 MCG tablet Take 500 mcg by mouth daily.      No current facility-administered medications on file prior to visit.   Past Medical History:  Diagnosis Date  . Acute ischemic stroke (HCC) 11/08/2018  . Adjustment disorder with depressed mood 10/16/2019  . Alteration consciousness 03/08/2020  . Apraxia following cerebrovascular accident 10/16/2019   . B12 deficiency 10/16/2019  . Body mass index (BMI) less than 19 02/16/2020  . Cerebrovascular accident (CVA) (HCC) 03/08/2020  . Chronic kidney disease, stage II (mild) 12/06/2019  . Closed fracture of one rib of right side with routine healing 06/30/2017  . Confusion   . COPD (chronic obstructive pulmonary disease) (HCC) 11/16/2017  . Current use of long term anticoagulation 07/21/2017  . Dementia, multiinfarct, with behavioral disturbance (HCC) 01/18/2020  . Depression   . Essential hypertension, benign 10/16/2019  . First degree atrioventricular block 06/26/2017  . Hammer toe 06/26/2017  . Hearing loss   . Hyperlipidemia 06/26/2017  . Malnutrition of moderate degree (HCC) 12/06/2019  . Memory loss   . Other specified cardiac arrhythmias 06/26/2017  . Pain in limb 06/26/2017  . Palpitations 06/26/2017  . Paroxysmal atrial fibrillation (HCC) 06/26/2017   Formatting of this note might be different from the original. Overview:  remote history post operatively, CHADS 2 score=2 Formatting of this note might be different from the original. remote history post operatively, CHADS 2 score=2  . Premature atrial complex 06/26/2017  . Premature ventricular contractions 06/26/2017  . PSVT (paroxysmal supraventricular tachycardia) (HCC) 11/10/2017  . 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 Stroke (HCC)   . Thoracic aorta atherosclerosis (HCC) 05/22/2020  . Traumatic pneumothorax 06/30/2017   Past Surgical History:  Procedure Laterality Date  .  ABDOMINAL HYSTERECTOMY    . SHOULDER SURGERY    . TONSILLECTOMY      Family History  Problem Relation Age of Onset  . Bone cancer Mother   . Breast cancer Father   . Diabetes Father   . Heart disease Father   . Breast cancer Sister   . Dementia Brother   . Alzheimer's disease Sister    Social History   Socioeconomic History  . Marital status: Widowed    Spouse name: Not on file  . Number of children: 2  . Years of education: 73   . Highest education level: High school graduate  Occupational History  . Occupation: Retired  Tobacco Use  . Smoking status: Never Smoker  . Smokeless tobacco: Never Used  Vaping Use  . Vaping Use: Never used  Substance and Sexual Activity  . Alcohol use: No  . Drug use: No  . Sexual activity: Not Currently  Other Topics Concern  . Not on file  Social History Narrative   [name] is widowed. Her daughter, [name], is her POA and lives 12 miles away. She resides in her own home, alone for last 7 years. Since her CVA she has had someone with her most of the time. She has a hired caregiver or a family member stays most of the day. She feel safe in her home and in her neighborhood.   Right-handed.   0.5 cups caffeine per day.   Social Determinants of Health   Financial Resource Strain: Not on file  Food Insecurity: Not on file  Transportation Needs: Not on file  Physical Activity: Not on file  Stress: Not on file  Social Connections: Not on file    Review of Systems  Constitutional: Negative for activity change and appetite change.  HENT: Positive for sinus pain. Negative for congestion.   Respiratory: Positive for cough and chest tightness. Negative for shortness of breath.   Cardiovascular: Positive for chest pain. Negative for palpitations and leg swelling.  Skin: Positive for scab on leg only.     Objective:  BP 108/62 (BP Location: Left Arm, Patient Position: Sitting, Cuff Size: Small)   Pulse 76   Temp (!) 97.3 F (36.3 C) (Temporal)   Ht 5\' 3"  (1.6 m)   Wt 108 lb (49 kg)   LMP  (LMP Unknown)   SpO2 98%   BMI 19.13 kg/m   BP/Weight 08/17/2020 08/03/2020 07/20/2020  Systolic BP 108 118 118  Diastolic BP 62 66 70  Wt. (Lbs) 108 106.6 108  BMI 19.13 18.88 19.13    Physical Exam Constitutional:      Appearance: Normal appearance.  Cardiovascular:     Rate and Rhythm: Normal rate and regular rhythm.     Pulses: Normal pulses.     Heart sounds:  Normal heart sounds.  Pulmonary:     Effort: Pulmonary effort is normal.     Breath sounds: Normal breath sounds.  Skin:    Comments: Patient is doing well and he ulcer has healed totally.  Neurological:     Mental Status: She is alert.       Lab Results  Component Value Date   WBC 6.7 05/22/2020   HGB 12.3 05/22/2020   HCT 35.9 05/22/2020   PLT 289 05/22/2020   GLUCOSE 88 05/22/2020   CHOL 128 05/22/2020   TRIG 81 05/22/2020   HDL 50 05/22/2020   LDLCALC 62 05/22/2020   ALT 11 05/22/2020   AST 15 05/22/2020  NA 139 05/22/2020   K 4.8 05/22/2020   CL 104 05/22/2020   CREATININE 0.68 05/22/2020   BUN 13 05/22/2020   CO2 24 05/22/2020   TSH 1.460 05/22/2020   HGBA1C 5.3 11/09/2018      Assessment & Plan:  Diagnoses and all orders for this visit: Laceration of right lower leg, subsequent encounter The ulcer has healed fully follow up PRN           Follow-up: Return in about 1 month  An After Visit Summary was printed and given to the patient.  Reinaldo Meeker, MD [facility] [phone]

## 2020-09-19 ENCOUNTER — Telehealth: Payer: Self-pay | Admitting: Neurology

## 2020-09-19 NOTE — Telephone Encounter (Signed)
I spoke to her daughter, [name]. The patient has been scheduled for a follow up on 09/24/20 at 3:30pm. She will have her here at 3pm for check-in.

## 2020-09-19 NOTE — Telephone Encounter (Signed)
Have called her daughter [name], also of her power of attorney, patient was noticed to have increased confusion, she is wondering if patient is safe to stay at home alone  Please give her a follow-up appointment, she would prefer PM appointment

## 2020-09-24 ENCOUNTER — Encounter: Payer: Self-pay | Admitting: Neurology

## 2020-09-24 ENCOUNTER — Ambulatory Visit: Payer: PPO | Admitting: Neurology

## 2020-09-24 VITALS — BP 109/63 | HR 73 | Ht 63.0 in | Wt 108.5 lb

## 2020-09-24 DIAGNOSIS — R413 Other amnesia: Secondary | ICD-10-CM

## 2020-09-24 DIAGNOSIS — I639 Cerebral infarction, unspecified: Secondary | ICD-10-CM

## 2020-09-24 DIAGNOSIS — R269 Unspecified abnormalities of gait and mobility: Secondary | ICD-10-CM | POA: Diagnosis not present

## 2020-09-24 DIAGNOSIS — G40209 Localization-related (focal) (partial) symptomatic epilepsy and epileptic syndromes with complex partial seizures, not intractable, without status epilepticus: Secondary | ICD-10-CM

## 2020-09-24 HISTORY — DX: Unspecified abnormalities of gait and mobility: R26.9

## 2020-09-24 MED ORDER — DIVALPROEX SODIUM ER 500 MG PO TB24: 500.0000 mg | ORAL_TABLET | Freq: Every day | ORAL | 11 refills | Status: DC
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
## 2020-09-24 NOTE — Patient Instructions (Signed)
Stop Keppra  Start Depakote ER 500mg  every night.

## 2020-09-25 ENCOUNTER — Ambulatory Visit (INDEPENDENT_AMBULATORY_CARE_PROVIDER_SITE_OTHER): Payer: PPO | Admitting: Legal Medicine

## 2020-09-25 ENCOUNTER — Other Ambulatory Visit: Payer: Self-pay

## 2020-09-25 ENCOUNTER — Ambulatory Visit: Payer: PPO

## 2020-09-25 ENCOUNTER — Encounter: Payer: Self-pay | Admitting: Legal Medicine

## 2020-09-25 VITALS — BP 100/60 | HR 73 | Temp 97.4°F | Resp 16 | Ht 63.0 in | Wt 107.0 lb

## 2020-09-25 DIAGNOSIS — E440 Moderate protein-calorie malnutrition: Secondary | ICD-10-CM | POA: Diagnosis not present

## 2020-09-25 DIAGNOSIS — I700 Atherosclerosis of aorta: Secondary | ICD-10-CM

## 2020-09-25 DIAGNOSIS — R300 Dysuria: Secondary | ICD-10-CM | POA: Diagnosis not present

## 2020-09-25 DIAGNOSIS — I480 Paroxysmal atrial fibrillation: Secondary | ICD-10-CM

## 2020-09-25 DIAGNOSIS — J449 Chronic obstructive pulmonary disease, unspecified: Secondary | ICD-10-CM | POA: Diagnosis not present

## 2020-09-25 DIAGNOSIS — F4321 Adjustment disorder with depressed mood: Secondary | ICD-10-CM | POA: Diagnosis not present

## 2020-09-25 DIAGNOSIS — G40209 Localization-related (focal) (partial) symptomatic epilepsy and epileptic syndromes with complex partial seizures, not intractable, without status epilepticus: Secondary | ICD-10-CM

## 2020-09-25 DIAGNOSIS — E538 Deficiency of other specified B group vitamins: Secondary | ICD-10-CM

## 2020-09-25 DIAGNOSIS — Z7901 Long term (current) use of anticoagulants: Secondary | ICD-10-CM

## 2020-09-25 DIAGNOSIS — E782 Mixed hyperlipidemia: Secondary | ICD-10-CM | POA: Diagnosis not present

## 2020-09-25 DIAGNOSIS — E7800 Pure hypercholesterolemia, unspecified: Secondary | ICD-10-CM

## 2020-09-25 DIAGNOSIS — H918X3 Other specified hearing loss, bilateral: Secondary | ICD-10-CM | POA: Diagnosis not present

## 2020-09-25 DIAGNOSIS — I10 Essential (primary) hypertension: Secondary | ICD-10-CM | POA: Diagnosis not present

## 2020-09-25 DIAGNOSIS — I69313 Psychomotor deficit following cerebral infarction: Secondary | ICD-10-CM

## 2020-09-25 DIAGNOSIS — N182 Chronic kidney disease, stage 2 (mild): Secondary | ICD-10-CM | POA: Diagnosis not present

## 2020-09-25 LAB — POCT URINALYSIS DIP (CLINITEK)
Bilirubin, UA: NEGATIVE
Blood, UA: NEGATIVE
Glucose, UA: NEGATIVE mg/dL
Ketones, POC UA: NEGATIVE mg/dL
Leukocytes, UA: NEGATIVE
Nitrite, UA: NEGATIVE
POC PROTEIN,UA: NEGATIVE
Spec Grav, UA: 1.02 (ref 1.010–1.025)
Urobilinogen, UA: 0.2 E.U./dL
pH, UA: 6 (ref 5.0–8.0)

## 2020-09-25 NOTE — Chronic Care Management (AMB) (Signed)
Chronic Care Management Pharmacy  Name: [name]  MRN: [id number] DOB: December 05, 1925  Chief Complaint/ HPI  [name],  87 y.o. , female presents for their Follow-Up CCM visit with the clinical pharmacist via telephone due to COVID-19 Pandemic.  PCP : [name], MD  Their chronic conditions include: atrial fibrillation, HTN, CKD II, HLD, Adjustment disorder with depressed mood  Office Visits: 09/04/2020 - ulcer has healed full follow up prn.  08/17/2020 - laceration right leg is healing well.  08/03/2020 - skin tear/laceration healing slowly.  07/20/2020 - continue wound dressing 07/06/2020 - shallow wound with good circulation.  06/29/2020 - change dressing every other day.  06/22/2020 - wound is clean. Home health to help with dressing. Recommend tetanus with next visit.  06/18/2020 - debridement of laceration from scraping leg on chair. Prescribed silvadene.  Consult Visit: 09/24/2020 - Neurology - stop Keppra and start Depakote ER 500 mg every night.  07/16/2020 - Neurology - mild cognitive impairment. Stop driving. Continue Keppra 250 mg bid.   Medications: Outpatient Encounter Medications as of 09/25/2020  Medication Sig  . acebutolol (SECTRAL) 200 MG capsule Take 1 capsule (200 mg total) by mouth daily.  [name] apixaban (ELIQUIS) 2.5 MG TABS tablet TAKE 1 TABLET(2.5 MG) BY MOUTH TWICE DAILY  . Apoaequorin (PREVAGEN PO) Take 1 tablet by mouth daily.  [name] atorvastatin (LIPITOR) 40 MG tablet TAKE 1 TABLET(40 MG) BY MOUTH DAILY  . Cholecalciferol (VITAMIN D3) 1000 units CAPS Take 1,000 Units by mouth daily.  [name] docusate sodium (COLACE) 100 MG capsule Take 100 mg by mouth daily as needed.  . levETIRAcetam (KEPPRA) 250 MG tablet Take 250 mg by mouth 2 (two) times daily.  . mirtazapine (REMERON) 30 MG tablet Take 1 tablet (30 mg total) by mouth at bedtime.  [name] OVER THE COUNTER MEDICATION Take 1 tablet by mouth daily. Selenex GSH supplement  . vitamin B-12  (CYANOCOBALAMIN) 500 MCG tablet Take 500 mcg by mouth daily.   . divalproex (DEPAKOTE ER) 500 MG 24 hr tablet Take 1 tablet (500 mg total) by mouth at bedtime. (Patient not taking: Reported on 09/25/2020)  . prednisoLONE acetate (PRED FORTE) 1 % ophthalmic suspension SMARTSIG:1 Drop(s) Right Eye Twice Daily PRN  . [DISCONTINUED] cefdinir (OMNICEF) 300 MG capsule Take 1 capsule (300 mg total) by mouth 2 (two) times daily.  . [DISCONTINUED] silver sulfADIAZINE (SILVADENE) 1 % cream Apply 1 application topically daily.   No facility-administered encounter medications on file as of 09/25/2020.   Allergies  Allergen Reactions  . Codeine Nausea And Vomiting  . Penicillins Rash  . Sulfa Antibiotics Nausea And Vomiting    SDOH Screenings   Alcohol Screen: Not on file  Depression (PHQ2-9): Medium Risk  . PHQ-2 Score: 6  Financial Resource Strain: Not on file  Food Insecurity: Not on file  Housing: Low Risk   . Last Housing Risk Score: 0  Physical Activity: Not on file  Social Connections: Not on file  Stress: Not on file  Tobacco Use: Low Risk   . Smoking Tobacco Use: Never Smoker  . Smokeless Tobacco Use: Never Used  Transportation Needs: Not on file     Current Diagnosis/Assessment:  Goals Addressed            This Visit's Progress   . Pharmacy Care Plan       CARE PLAN ENTRY (see longitudinal plan of care for additional care plan information)  Current Barriers:  . Uncontrolled hyperlipidemia, complicated by age,  htn . Current antihyperlipidemic regimen: atorvastatin . Previous antihyperlipidemic medications tried pravastatin . Most recent lipid panel:     Component Value Date/Time   CHOL 128 05/22/2020 1104   TRIG 81 05/22/2020 1104   HDL 50 05/22/2020 1104   CHOLHDL 2.6 05/22/2020 1104   CHOLHDL 4.3 11/09/2018 0512   VLDL 11 11/09/2018 0512   LDLCALC 62 05/22/2020 1104   . ASCVD risk enhancing conditions: age >52, DM, HTN, CKD, CHF, current smoker . 10-year  ASCVD risk score: Hx of Stroke  Pharmacist Clinical Goal(s):  [name] Over the next 60 days, patient will work with PharmD and providers towards maintaining antihyperlipidemic therapy adherence.   Interventions: . Comprehensive medication review performed; medication list updated in electronic medical record.  [name] care team collaboration (see longitudinal plan of care) . Ensure safety, efficacy, and affordability of medications . Discussed importance of preventing future stroke with atorvastatin. Patient likely had seizure activity related to stroke from last year following -up with neurology November.   Patient Self Care Activities:  . Patient will focus on medication adherence by continuing to use daily medication planner.   Please see past updates related to this goal by clicking on the "Past Updates" button in the selected goal      . [place] (see longitudinal plan of care for additional care plan information)  Current Barriers:  . Hypertension/Afib, complicated by hypotension . Current antihypertensive regimen: acebutolol 200 mg daily . Last practice recorded BP readings:  BP Readings from Last 3 Encounters:  03/08/20 122/76  02/16/20 130/70  01/24/20 (!) 90/52 .   [name] Current home BP readings: stable per daughter . Most recent eGFR/CrCl: No results found for: EGFR  No components found for: CRCL  Pharmacist Clinical Goal(s):  [name] Over the next 180 days, patient will work with PharmD and providers to optimize blood pressure readings.   Interventions: . Inter-disciplinary care team collaboration (see longitudinal plan of care) . Comprehensive medication review performed; medication list updated in the electronic medical record.  . Ensure safety, efficacy, and affordability of medications . Discussed patient tolerating acebutolol well once daily.    Patient Self Care Activities:  . Patient will continue to check BP weekly once  meter available , document, and provide at future appointments . Patient will focus on medication adherence by continuing to use daily medication planner.  Please see past updates related to this goal by clicking on the "Past Updates" button in the selected goal         AFIB/HTN    Patient is currently rate controlled.  Patient has failed these meds in past: fludrocortisone, Xarelto Patient is currently controlled on the following medications:   acebutolol 200 mg daily  Eliquis 2.5 mg bid   Office blood pressures are  BP Readings from Last 3 Encounters:  09/25/20 100/60  09/24/20 109/63  09/04/20 116/60   Lab Results  Component Value Date   CREATININE 0.68 05/22/2020   BUN 13 05/22/2020   GFRNONAA 77 05/22/2020   GFRAA 88 05/22/2020   NA 139 05/22/2020   K 4.8 05/22/2020   CALCIUM 9.2 05/22/2020   CO2 24 05/22/2020   Patient checks BP at home: weekly  Patient home BP readings are ranging: stable bp per daughter  We discussed:  diet and exercise extensively. Daughter indicates bp is stable at this time. Patient has had 1 fall lately when she tripped on her display at [facility].  Daughter denies symptoms of dizziness or lightheadedness. Patient is taking medication as prescribed and daughter places medication in pill box.   Plan  Continue current medications    Hyperlipidemia   Lipid Panel     Component Value Date/Time   CHOL 128 05/22/2020 1104   TRIG 81 05/22/2020 1104   HDL 50 05/22/2020 1104   CHOLHDL 2.6 05/22/2020 1104   CHOLHDL 4.3 11/09/2018 0512   VLDL 11 11/09/2018 0512   LDLCALC 62 05/22/2020 1104   LABVLDL 16 05/22/2020 1104     The ASCVD Risk score (Goff DC Jr., et al., 2013) failed to calculate for the following reasons:   The 2013 ASCVD risk score is only valid for ages 63 to 81   The patient has a prior MI or stroke diagnosis   Patient has failed these meds in past: pravastatin Patient is currently controlled on the following  medications:   atorvastatin 40 mg daily  We discussed:  diet and exercise extensively. Patient is taking medication as prescribed. Enjoys getting out with daughter or caregiver. Patient no longer driving. Labs rechecked in office today.   Plan  Continue current medications     and  Adjustment Disorder with Depressed Mood   Patient has failed these meds in past: n/a Patient is currently controlled on the following medications:   Mirtazapine 30 mg daily at bedtime  We discussed:  Patient has taken this for years. Her Dr. that was managing this retired. Dr. Henrene Pastor will now take over prescribing this. She has been stable on it for years. Takes it at bedtime each day. She has worsened depressions/"spells" in the winter time. She is enjoying her flowers, weeding and maintaining the beds outdoors now. This improves her mood dramatically.    Daughter reports that her mom is stable at this time. Still struggling with losing some of her independence and what she can no longer do. Daughter has tried to have her tested for demential with neurology. Patient has caregiver and daughter support at home. Daughter is addressing safety concerns in the house as well.   Plan  Continue current medications   Seizures   Patient has failed these meds in past: n/a Patient is currently controlled on the following medications:  [name] Keppra 250 mg bid  We discussed: Depakote recommended by neurology during visit 09/24/2020. Patient's daughter isn't certain they will try this. She has concerns on starting/adjusting medication regimen. States that she understands Depakote could be helpful with mood. Daughter states that she and her mom have discussed a more positive outlook going forward. States that her ability to have caregivers in her home is wonderful and all the good things she has in spite of her not being able to drive. She reports that her mom is not having seizures that they know of at this time.    Pharmacist encouraged patient to contact with any questions concerning Depakote. Encouraged daughter to let neurology know if she decided not to try Depakote.   Plan  Continue current medications   Health Maintenance   Patient is currently controlled on the following medications:   Vitamin d 1000 daily- general health Vitamin b-12 1000 daily - general health Selenex GSH - general health Advanced Hearing Supplement - general health/hearing Prevagen - general health/memory Docusate 100 mg daily prn - constipation  We discussed:  Patient's taking supplements that she has found for supporting healthy living.  Plan  Continue current medications   Medication Management   Pt uses [facility] [place]  [facility] for all medications Patient fills and takes medication from a weekly pill box organizer.  Pt endorses good compliance  We discussed: Continuing to take medications as prescribed. Daughter packaged pill box each week and prefers to continue with current system for now.   Plan  Continue current medications and supplements.   Follow up: 6  months

## 2020-09-25 NOTE — Progress Notes (Signed)
Subjective:  Patient ID: [name], female    DOB: 11/01/1925  Age: 87 y.o. MRN: [id number]  Chief Complaint  Patient presents with  . Hyperlipidemia  . Atrial Fibrillation  . Hypertension    HPI: Chronic visit  Patient presents for follow up of hypertension.  Patient tolerating acebutolol well with side effects.  Patient was diagnosed with hypertension 2010 so has been treated for hypertension for 10 years.Patient is working on maintaining diet and exercise regimen and follows up as directed. Complication include none . Patient presents with hyperlipidemia.  Compliance with treatment has been good; patient takes medicines as directed, maintains low cholesterol diet, follows up as directed, and maintains exercise regimen.  Patient is using atorvastatin without problems.  Patient has a diagnosis of paroxysmal atrial fibrillation.   Patient is on eliquis and has controlled ventricular response.  Patient is CV stable       Current Outpatient Medications on File Prior to Visit  Medication Sig Dispense Refill  . acebutolol (SECTRAL) 200 MG capsule Take 1 capsule (200 mg total) by mouth daily. 90 capsule 4  . apixaban (ELIQUIS) 2.5 MG TABS tablet TAKE 1 TABLET(2.5 MG) BY MOUTH TWICE DAILY 180 tablet 3  . Apoaequorin (PREVAGEN PO) Take 1 tablet by mouth daily.    [name]. atorvastatin (LIPITOR) 40 MG tablet TAKE 1 TABLET(40 MG) BY MOUTH DAILY 90 tablet 2  . Cholecalciferol (VITAMIN D3) 1000 units CAPS Take 1,000 Units by mouth daily.    . divalproex (DEPAKOTE ER) 500 MG 24 hr tablet Take 1 tablet (500 mg total) by mouth at bedtime. (Patient not taking: Reported on 09/25/2020) 30 tablet 11  . docusate sodium (COLACE) 100 MG capsule Take 100 mg by mouth daily as needed.    . mirtazapine (REMERON) 30 MG tablet Take 1 tablet (30 mg total) by mouth at bedtime. 90 tablet 1  . OVER THE COUNTER MEDICATION Take 1 tablet by mouth daily. Selenex GSH supplement    . prednisoLONE acetate (PRED FORTE) 1 %  ophthalmic suspension SMARTSIG:1 Drop(s) Right Eye Twice Daily PRN    . vitamin B-12 (CYANOCOBALAMIN) 500 MCG tablet Take 500 mcg by mouth daily.     [name]. levETIRAcetam (KEPPRA) 250 MG tablet Take 250 mg by mouth 2 (two) times daily.     No current facility-administered medications on file prior to visit.   Past Medical History:  Diagnosis Date  . Acute ischemic stroke (HCC) 11/08/2018  . Adjustment disorder with depressed mood 10/16/2019  . Alteration consciousness 03/08/2020  . Apraxia following cerebrovascular accident 10/16/2019  . B12 deficiency 10/16/2019  . Body mass index (BMI) less than 19 02/16/2020  . Cerebrovascular accident (CVA) (HCC) 03/08/2020  . Chronic kidney disease, stage II (mild) 12/06/2019  . Closed fracture of one rib of right side with routine healing 06/30/2017  . Confusion   . COPD (chronic obstructive pulmonary disease) (HCC) 11/16/2017  . Current use of long term anticoagulation 07/21/2017  . Dementia, multiinfarct, with behavioral disturbance (HCC) 01/18/2020  . Depression   . Essential hypertension, benign 10/16/2019  . First degree atrioventricular block 06/26/2017  . Hammer toe 06/26/2017  . Hearing loss   . Hyperlipidemia 06/26/2017  . Malnutrition of moderate degree (HCC) 12/06/2019  . Memory loss   . Other specified cardiac arrhythmias 06/26/2017  . Pain in limb 06/26/2017  . Palpitations 06/26/2017  . Paroxysmal atrial fibrillation (HCC) 06/26/2017   Formatting of this note might be different from the original. Overview:  remote history  post operatively, CHADS 2 score=2 Formatting of this note might be different from the original. remote history post operatively, CHADS 2 score=2  . Premature atrial complex 06/26/2017  . Premature ventricular contractions 06/26/2017  . PSVT (paroxysmal supraventricular tachycardia) (HCC) 11/10/2017  . 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 Stroke (HCC)   . Thoracic aorta atherosclerosis (HCC) 05/22/2020  .  Traumatic pneumothorax 06/30/2017   Past Surgical History:  Procedure Laterality Date  . ABDOMINAL HYSTERECTOMY    . SHOULDER SURGERY    . TONSILLECTOMY      Family History  Problem Relation Age of Onset  . Bone cancer Mother   . Breast cancer Father   . Diabetes Father   . Heart disease Father   . Breast cancer Sister   . Dementia Brother   . Alzheimer's disease Sister    Social History   Socioeconomic History  . Marital status: Widowed    Spouse name: Not on file  . Number of children: 2  . Years of education: 50  . Highest education level: High school graduate  Occupational History  . Occupation: Retired  Tobacco Use  . Smoking status: Never Smoker  . Smokeless tobacco: Never Used  Vaping Use  . Vaping Use: Never used  Substance and Sexual Activity  . Alcohol use: No  . Drug use: No  . Sexual activity: Not Currently  Other Topics Concern  . Not on file  Social History Narrative   [name] is widowed. Her daughter, [name], is her POA and lives 12 miles away. She resides in her own home, alone for last 7 years. Since her CVA she has had someone with her most of the time. She has a hired caregiver or a family member stays most of the day. She feel safe in her home and in her neighborhood.   Right-handed.   0.5 cups caffeine per day.   Social Determinants of Health   Financial Resource Strain: Not on file  Food Insecurity: Not on file  Transportation Needs: Not on file  Physical Activity: Not on file  Stress: Not on file  Social Connections: Not on file    Review of Systems  Constitutional: Negative for activity change, appetite change and fatigue.  HENT: Negative for congestion and sinus pain.   Eyes: Negative for visual disturbance.  Respiratory: Negative for chest tightness and shortness of breath.   Cardiovascular: Negative for chest pain, palpitations and leg swelling.  Gastrointestinal: Negative.  Negative for abdominal distention and  abdominal pain.  Endocrine: Negative for polyuria.  Genitourinary: Negative for difficulty urinating, dyspareunia and urgency.  Musculoskeletal: Positive for arthralgias.  Skin: Negative.   Neurological: Negative.   Psychiatric/Behavioral: Positive for confusion.    Depression screen [facility] 2/9 09/25/2020 05/22/2020 01/10/2019 12/20/2018  Decreased Interest 1 3 0 0  Down, Depressed, Hopeless 1 3 0 0  PHQ - 2 Score 2 6 0 0  Altered sleeping 0 3 - -  Tired, decreased energy 2 3 - -  Change in appetite 0 3 - -  Feeling bad or failure about yourself  0 3 - -  Trouble concentrating - 3 - -  Moving slowly or fidgety/restless 2 3 - -  Suicidal thoughts 0 1 - -  PHQ-9 Score 6 25 - -  Difficult doing work/chores Somewhat difficult Extremely dIfficult - -   Objective:  BP 100/60   Pulse 73   Temp (!) 97.4 F (36.3 C)   Resp 16  Ht 5\' 3"  (1.6 m)   Wt 107 lb (48.5 kg)   LMP  (LMP Unknown)   SpO2 95%   BMI 18.95 kg/m   BP/Weight 09/25/2020 09/24/2020 09/04/2020  Systolic BP 100 109 116  Diastolic BP 60 63 60  Wt. (Lbs) 107 108.5 108  BMI 18.95 19.22 19.13    Physical Exam Vitals reviewed.  Constitutional:      General: She is not in acute distress.    Appearance: Normal appearance.  HENT:     Right Ear: Tympanic membrane, ear canal and external ear normal.     Left Ear: Tympanic membrane, ear canal and external ear normal.     Mouth/Throat:     Mouth: Mucous membranes are moist.     Pharynx: Oropharynx is clear.  Eyes:     Extraocular Movements: Extraocular movements intact.     Conjunctiva/sclera: Conjunctivae normal.     Pupils: Pupils are equal, round, and reactive to light.  Cardiovascular:     Rate and Rhythm: Normal rate. Rhythm irregular.     Pulses: Normal pulses.     Heart sounds: No murmur heard. No gallop.   Pulmonary:     Effort: Pulmonary effort is normal. No respiratory distress.     Breath sounds: Normal breath sounds. No wheezing.  Abdominal:     General:  Abdomen is flat. Bowel sounds are normal. There is no distension.     Palpations: Abdomen is soft.     Tenderness: There is no abdominal tenderness.  Musculoskeletal:        General: Normal range of motion.     Cervical back: Normal range of motion and neck supple.  Skin:    General: Skin is warm and dry.     Capillary Refill: Capillary refill takes less than 2 seconds.  Neurological:     General: No focal deficit present.     Mental Status: She is alert. Mental status is at baseline. She is disoriented.  Psychiatric:        Mood and Affect: Mood normal.        Thought Content: Thought content normal.        Judgment: Judgment normal.       Lab Results  Component Value Date   WBC 6.7 05/22/2020   HGB 12.3 05/22/2020   HCT 35.9 05/22/2020   PLT 289 05/22/2020   GLUCOSE 88 05/22/2020   CHOL 128 05/22/2020   TRIG 81 05/22/2020   HDL 50 05/22/2020   LDLCALC 62 05/22/2020   ALT 11 05/22/2020   AST 15 05/22/2020   NA 139 05/22/2020   K 4.8 05/22/2020   CL 104 05/22/2020   CREATININE 0.68 05/22/2020   BUN 13 05/22/2020   CO2 24 05/22/2020   TSH 1.460 05/22/2020   HGBA1C 5.3 11/09/2018      Assessment & Plan:   1. Benign essential hypertension - Comprehensive metabolic panel - CBC with Differential/Platelet An individual hypertension care plan was established and reinforced today.  The patient's status was assessed using clinical findings on exam and labs or diagnostic tests. The patient's success at meeting treatment goals on disease specific evidence-based guidelines and found to be well controlled. SELF MANAGEMENT: The patient and I together assessed ways to personally work towards obtaining the recommended goals. RECOMMENDATIONS: avoid decongestants found in common cold remedies, decrease consumption of alcohol, perform routine monitoring of BP with home BP cuff, exercise, reduction of dietary salt, take medicines as prescribed, try not to miss doses  and quit smoking.   Regular exercise and maintaining a healthy weight is needed.  Stress reduction may help. A CLINICAL SUMMARY including written plan identify barriers to care unique to individual due to social or financial issues.  We attempt to mutually creat solutions for individual and family understanding.  2. Mixed hyperlipidemia - Lipid panel AN INDIVIDUAL CARE PLAN for hyperlipidemia/ cholesterol was established and reinforced today.  The patient's status was assessed using clinical findings on exam, lab and other diagnostic tests. The patient's disease status was assessed based on evidence-based guidelines and found to be well controlled. MEDICATIONS were reviewed. SELF MANAGEMENT GOALS have been discussed and patient's success at attaining the goal of low cholesterol was assessed. RECOMMENDATION given include regular exercise 3 days a week and low cholesterol/low fat diet. CLINICAL SUMMARY including written plan to identify barriers unique to the patient due to social or economic  reasons was discussed.  3. Other specified hearing loss of both ears Patient has chronic hearing loss and is using hearing aids.  She still has difficulty understanding conversation  4. B12 deficiency Patient has B12 deficiency and is taking oral B12.    6. Paroxysmal atrial fibrillation [facility]) Patient has a diagnosis of paroxysmal atrial fibrillation.   Patient is on eliquis and has controlled ventricular response.  Patient is CV stable.  7. Current use of long term anticoagulation Patient is on Eliquis and having no bleeding diathesis we will continue on this at the present time.  8. Chronic kidney disease, stage II (mild) Patient has mild chronic kidney disease which we are watching closely.  9. Adjustment disorder with depressed mood Patient has mild dementia and is seen [place] [facility] for this she also had some seizure-like disorder and continues on divalproex.  She seems much less depressed today.  She should  not drive  10. Malnutrition of moderate degree (HCC) Supplement nutrition with protein/calorie supplement with meals to improve nutritional status.  We discussed restarting her Ensure or boost at least 1 can a day.  11. Chronic obstructive pulmonary disease, unspecified COPD type (HCC) An individualize plan was formulated for care of COPD.  Treatment is evidence based.  She will continue on inhalers, avoid smoking and smoke.  Regular exercise with help with dyspnea. Routine follow ups and medication compliance is needed.  12. Thoracic aorta atherosclerosis (HCC) Found on CT abdomen , no aneurysm      Orders Placed This Encounter  Procedures  . Comprehensive metabolic panel  . Lipid panel  . CBC with Differential/Platelet  . Valproic acid level  . POCT URINALYSIS DIP (CLINITEK)      I spent 30 minutes dedicated to the care of this patient on the date of this encounter to include face-to-face time with the patient, as well as: review neurology records  Follow-up: Return in about 6 months (around 03/25/2021).  An After Visit Summary was printed and given to the patient.  Brent Bulla, MD [facility] [phone]

## 2020-09-25 NOTE — Patient Instructions (Addendum)
Visit Information  Goals Addressed            This Visit's Progress   . Pharmacy Care Plan       CARE PLAN ENTRY (see longitudinal plan of care for additional care plan information)  Current Barriers:  . Uncontrolled hyperlipidemia, complicated by age, htn . Current antihyperlipidemic regimen: atorvastatin . Previous antihyperlipidemic medications tried pravastatin . Most recent lipid panel:     Component Value Date/Time   CHOL 128 05/22/2020 1104   TRIG 81 05/22/2020 1104   HDL 50 05/22/2020 1104   CHOLHDL 2.6 05/22/2020 1104   CHOLHDL 4.3 11/09/2018 0512   VLDL 11 11/09/2018 0512   LDLCALC 62 05/22/2020 1104   . ASCVD risk enhancing conditions: age >47, DM, HTN, CKD, CHF, current smoker . 10-year ASCVD risk score: Hx of Stroke  Pharmacist Clinical Goal(s):  [name] Over the next 60 days, patient will work with PharmD and providers towards maintaining antihyperlipidemic therapy adherence.   Interventions: . Comprehensive medication review performed; medication list updated in electronic medical record.  [name] care team collaboration (see longitudinal plan of care) . Ensure safety, efficacy, and affordability of medications . Discussed importance of preventing future stroke with atorvastatin. Patient likely had seizure activity related to stroke from last year following -up with neurology November.   Patient Self Care Activities:  . Patient will focus on medication adherence by continuing to use daily medication planner.   Please see past updates related to this goal by clicking on the "Past Updates" button in the selected goal      . [place] (see longitudinal plan of care for additional care plan information)  Current Barriers:  . Hypertension/Afib, complicated by hypotension . Current antihypertensive regimen: acebutolol 200 mg daily . Last practice recorded BP readings:  BP Readings from Last 3 Encounters:  03/08/20  122/76  02/16/20 130/70  01/24/20 (!) 90/52 .   [name] Current home BP readings: stable per daughter . Most recent eGFR/CrCl: No results found for: EGFR  No components found for: CRCL  Pharmacist Clinical Goal(s):  [name] Over the next 180 days, patient will work with PharmD and providers to optimize blood pressure readings.   Interventions: . Inter-disciplinary care team collaboration (see longitudinal plan of care) . Comprehensive medication review performed; medication list updated in the electronic medical record.  . Ensure safety, efficacy, and affordability of medications . Discussed patient tolerating acebutolol well once daily.    Patient Self Care Activities:  . Patient will continue to check BP weekly once meter available , document, and provide at future appointments . Patient will focus on medication adherence by continuing to use daily medication planner.  Please see past updates related to this goal by clicking on the "Past Updates" button in the selected goal         Patient verbalizes understanding of instructions provided today and agrees to view in [place].   Telephone follow up appointment with pharmacy team member scheduled for: 04/2021  Burnice Logan, [facility] Valproic Acid, Divalproex Sodium delayed or extended-release tablets What is this medicine? DIVALPROEX SODIUM (dye VAL pro ex SO dee um) is used to prevent seizures caused by some forms of epilepsy. It is also used to treat bipolar mania and to prevent migraine headaches. This medicine may be used for other purposes; ask your health care provider or pharmacist if you have questions. COMMON BRAND NAME(S): Depakote, Depakote ER What should I  tell my health care provider before I take this medicine? They need to know if you have any of these conditions:  if you often drink alcohol  kidney disease  liver disease  low platelet counts  mitochondrial disease  suicidal thoughts, plans, or attempt; a previous  suicide attempt by you or a family member  urea cycle disorder (UCD)  an unusual or allergic reaction to divalproex sodium, sodium valproate, valproic acid, other medicines, foods, dyes, or preservatives  pregnant or trying to get pregnant  breast-feeding How should I use this medicine? Take this medicine by mouth with a drink of water. Follow the directions on the prescription label. Do not cut, crush or chew this medicine. You can take it with or without food. If it upsets your stomach, take it with food. Take your medicine at regular intervals. Do not take it more often than directed. Do not stop taking except on your doctor's advice. A special MedGuide will be given to you by the pharmacist with each prescription and refill. Be sure to read this information carefully each time. Talk to your pediatrician regarding the use of this medicine in children. While this drug may be prescribed for children as young as 10 years for selected conditions, precautions do apply. Overdosage: If you think you have taken too much of this medicine contact a poison control center or emergency room at once. NOTE: This medicine is only for you. Do not share this medicine with others. What if I miss a dose? If you miss a dose, take it as soon as you can. If it is almost time for your next dose, take only that dose. Do not take double or extra doses. What may interact with this medicine? Do not take this medicine with any of the following medications:  sodium phenylbutyrate This medicine may also interact with the following medications:  aspirin  certain antibiotics like ertapenem, imipenem, meropenem  certain medicines for depression, anxiety, or psychotic disturbances  certain medicines for seizures like carbamazepine, clonazepam, diazepam, ethosuximide, felbamate, lamotrigine, phenobarbital, phenytoin, primidone, rufinamide, topiramate  certain medicines that treat or prevent blood clots like  warfarin  cholestyramine  female hormones, like estrogens and birth control pills, patches, or rings  propofol  rifampin  ritonavir  tolbutamide  zidovudine This list may not describe all possible interactions. Give your health care provider a list of all the medicines, herbs, non-prescription drugs, or dietary supplements you use. Also tell them if you smoke, drink alcohol, or use illegal drugs. Some items may interact with your medicine. What should I watch for while using this medicine? Tell your doctor or health care provider if your symptoms do not get better or they start to get worse. This medicine may cause serious skin reactions. They can happen weeks to months after starting the medicine. Contact your health care provider right away if you notice fevers or flu-like symptoms with a rash. The rash may be red or purple and then turn into blisters or peeling of the skin. Or, you might notice a red rash with swelling of the face, lips or lymph nodes in your neck or under your arms. Wear a medical ID bracelet or chain, and carry a card that describes your disease and details of your medicine and dosage times. You may get drowsy, dizzy, or have blurred vision. Do not drive, use machinery, or do anything that needs mental alertness until you know how this medicine affects you. To reduce dizzy or fainting spells, do not  sit or stand up quickly, especially if you are an older patient. Alcohol can increase drowsiness and dizziness. Avoid alcoholic drinks. This medicine can make you more sensitive to the sun. Keep out of the sun. If you cannot avoid being in the sun, wear protective clothing and use sunscreen. Do not use sun lamps or tanning beds/booths. Patients and their families should watch out for new or worsening depression or thoughts of suicide. Also watch out for sudden changes in feelings such as feeling anxious, agitated, panicky, irritable, hostile, aggressive, impulsive, severely  restless, overly excited and hyperactive, or not being able to sleep. If this happens, especially at the beginning of treatment or after a change in dose, call your health care provider. Women should inform their doctor if they wish to become pregnant or think they might be pregnant. There is a potential for serious side effects to an unborn child. Talk to your health care provider or pharmacist for more information. Women who become pregnant while using this medicine may enroll in the [place] Pregnancy Registry by calling [phone]. This registry collects information about the safety of antiepileptic drug use during pregnancy. This medicine may cause a decrease in folic acid and vitamin D. You should make sure that you get enough vitamins while you are taking this medicine. Discuss the foods you eat and the vitamins you take with your health care provider. What side effects may I notice from receiving this medicine? Side effects that you should report to your doctor or health care professional as soon as possible:  allergic reactions like skin rash, itching or hives, swelling of the face, lips, or tongue  changes in vision  rash, fever, and swollen lymph nodes  redness, blistering, peeling or loosening of the skin, including inside the mouth  signs and symptoms of liver injury like dark yellow or [place] urine; general ill feeling or flu-like symptoms; light-colored stools; loss of appetite; nausea; right upper belly pain; unusually weak or tired; yellowing of the eyes or skin  suicidal thoughts or other mood changes  unusual bleeding or bruising Side effects that usually do not require medical attention (report to your doctor or health care professional if they continue or are bothersome):  constipation  diarrhea  dizziness  hair loss  headache  loss of appetite  weight gain This list may not describe all possible side effects. Call your doctor for  medical advice about side effects. You may report side effects to FDA at 1-800-FDA-1088. Where should I keep my medicine? Keep out of reach of children. Store at room temperature between 15 and 30 degrees C (59 and 86 degrees F). Keep container tightly closed. Throw away any unused medicine after the expiration date. NOTE: This sheet is a summary. It may not cover all possible information. If you have questions about this medicine, talk to your doctor, pharmacist, or health care provider.  2021 Elsevier/Gold Standard (2018-11-26 16:03:42)

## 2020-09-26 LAB — CBC WITH DIFFERENTIAL/PLATELET
Basophils Absolute: 0 10*3/uL (ref 0.0–0.2)
Basos: 1 %
EOS (ABSOLUTE): 0.1 10*3/uL (ref 0.0–0.4)
Eos: 2 %
Hematocrit: 37 % (ref 34.0–46.6)
Hemoglobin: 12.3 g/dL (ref 11.1–15.9)
Immature Grans (Abs): 0 10*3/uL (ref 0.0–0.1)
Immature Granulocytes: 0 %
Lymphocytes Absolute: 1.6 10*3/uL (ref 0.7–3.1)
Lymphs: 28 %
MCH: 32.7 pg (ref 26.6–33.0)
MCHC: 33.2 g/dL (ref 31.5–35.7)
MCV: 98 fL — ABNORMAL HIGH (ref 79–97)
Monocytes Absolute: 0.7 10*3/uL (ref 0.1–0.9)
Monocytes: 11 %
Neutrophils Absolute: 3.3 10*3/uL (ref 1.4–7.0)
Neutrophils: 58 %
Platelets: 235 10*3/uL (ref 150–450)
RBC: 3.76 x10E6/uL — ABNORMAL LOW (ref 3.77–5.28)
RDW: 12 % (ref 11.7–15.4)
WBC: 5.7 10*3/uL (ref 3.4–10.8)

## 2020-09-26 LAB — COMPREHENSIVE METABOLIC PANEL
ALT: 21 IU/L (ref 0–32)
AST: 26 IU/L (ref 0–40)
Albumin/Globulin Ratio: 1.7 (ref 1.2–2.2)
Albumin: 4.2 g/dL (ref 3.5–4.6)
Alkaline Phosphatase: 82 IU/L (ref 44–121)
BUN/Creatinine Ratio: 22 (ref 12–28)
BUN: 17 mg/dL (ref 10–36)
Bilirubin Total: 0.4 mg/dL (ref 0.0–1.2)
CO2: 24 mmol/L (ref 20–29)
Calcium: 9.3 mg/dL (ref 8.7–10.3)
Chloride: 102 mmol/L (ref 96–106)
Creatinine, Ser: 0.78 mg/dL (ref 0.57–1.00)
GFR calc Af Amer: 76 mL/min/{1.73_m2} (ref >59)
GFR calc non Af Amer: 66 mL/min/{1.73_m2} (ref >59)
Globulin, Total: 2.5 g/dL (ref 1.5–4.5)
Glucose: 90 mg/dL (ref 65–99)
Potassium: 5.1 mmol/L (ref 3.5–5.2)
Sodium: 141 mmol/L (ref 134–144)
Total Protein: 6.7 g/dL (ref 6.0–8.5)

## 2020-09-26 LAB — CARDIOVASCULAR RISK ASSESSMENT

## 2020-09-26 LAB — LIPID PANEL
Chol/HDL Ratio: 3.2 ratio (ref 0.0–4.4)
Cholesterol, Total: 142 mg/dL (ref 100–199)
HDL: 45 mg/dL (ref >39)
LDL Chol Calc (NIH): 79 mg/dL (ref 0–99)
Triglycerides: 93 mg/dL (ref 0–149)
VLDL Cholesterol Cal: 18 mg/dL (ref 5–40)

## 2020-09-26 LAB — VALPROIC ACID LEVEL: Valproic Acid Lvl: <4 ug/mL — ABNORMAL LOW (ref 50–100)

## 2020-09-26 NOTE — Progress Notes (Signed)
Valproic acid low level, kidney and liver tests normal, CBC normal lp

## 2020-09-29 ENCOUNTER — Encounter: Payer: Self-pay | Admitting: Legal Medicine

## 2020-10-07 ENCOUNTER — Encounter: Payer: Self-pay | Admitting: Legal Medicine

## 2020-10-08 NOTE — Telephone Encounter (Signed)
We can do this but Syreeta is not to drive lp

## 2020-11-01 DIAGNOSIS — I8393 Asymptomatic varicose veins of bilateral lower extremities: Secondary | ICD-10-CM | POA: Diagnosis not present

## 2020-11-01 DIAGNOSIS — I480 Paroxysmal atrial fibrillation: Secondary | ICD-10-CM | POA: Diagnosis not present

## 2020-11-01 DIAGNOSIS — I739 Peripheral vascular disease, unspecified: Secondary | ICD-10-CM | POA: Diagnosis not present

## 2020-11-01 DIAGNOSIS — Z8659 Personal history of other mental and behavioral disorders: Secondary | ICD-10-CM | POA: Diagnosis not present

## 2020-11-01 DIAGNOSIS — E440 Moderate protein-calorie malnutrition: Secondary | ICD-10-CM | POA: Diagnosis not present

## 2020-11-01 DIAGNOSIS — F330 Major depressive disorder, recurrent, mild: Secondary | ICD-10-CM | POA: Diagnosis not present

## 2020-11-01 DIAGNOSIS — J449 Chronic obstructive pulmonary disease, unspecified: Secondary | ICD-10-CM | POA: Diagnosis not present

## 2020-11-01 DIAGNOSIS — Z681 Body mass index (BMI) 19 or less, adult: Secondary | ICD-10-CM | POA: Diagnosis not present

## 2020-11-01 DIAGNOSIS — F0390 Unspecified dementia without behavioral disturbance: Secondary | ICD-10-CM | POA: Diagnosis not present

## 2020-11-01 DIAGNOSIS — D6869 Other thrombophilia: Secondary | ICD-10-CM | POA: Diagnosis not present

## 2020-11-01 DIAGNOSIS — D692 Other nonthrombocytopenic purpura: Secondary | ICD-10-CM | POA: Diagnosis not present

## 2020-11-01 DIAGNOSIS — I69331 Monoplegia of upper limb following cerebral infarction affecting right dominant side: Secondary | ICD-10-CM | POA: Diagnosis not present

## 2020-11-05 DIAGNOSIS — R413 Other amnesia: Secondary | ICD-10-CM | POA: Insufficient documentation

## 2020-11-12 NOTE — Progress Notes (Signed)
Cardiology Office Note:    Date:  11/13/2020   ID:  [name], DOB 11/20/1925, MRN [id number]  PCP:  Abigail Miyamoto, MD  Cardiologist:  Norman Herrlich, MD    Referring MD: Abigail Miyamoto,*    ASSESSMENT:    1. Other specified hypotension   2. Paroxysmal atrial fibrillation (HCC)   3. Current use of long term anticoagulation   4. Pure hypercholesterolemia   5. Hypotension, unspecified hypotension type    PLAN:    In order of problems listed above:  1. New problem uncertain etiology recheck a CBC although no obvious bleeding electrolytes and a cortisol level.  We will trend home blood pressures and if persistent will need to take an alpha agonist. 2. Stable atrial fibrillation in sinus rhythm continue her low-dose beta-blocker previously well-tolerated systolics above 100 and her anticoagulant 3. Stable lipids at target continue statin with history of stroke   Next appointment: 6 months   Medication Adjustments/Labs and Tests Ordered: Current medicines are reviewed at length with the patient today.  Concerns regarding medicines are outlined above.  Orders Placed This Encounter  Procedures  . Basic metabolic panel  . CBC  . Cortisol  . EKG 12-Lead   No orders of the defined types were placed in this encounter.   Chief Complaint  Patient presents with  . Atrial Fibrillation  . Anticoagulation  . Hyperlipidemia    History of Present Illness:    [name] is a 87 y.o. female with a hx of paroxysmal atrial fibrillation paroxysmal atrial tachycardia long-term anticoagulation and hyperlipidemia last seen 05/29/2020.  She had sustained a stroke in March 2020 left middle cerebral artery distribution after Xarelto withdrawal for eye surgery. Compliance with diet, lifestyle and medications: Yes  Overall she is doing well no rapid heartbeats syncope or bleeding from anticoagulant but she is hypotensive in the office again 92/60 small cuff both arms.   Its been 2 months we will recheck a CBC recheck her renal function along with a cortisol level they will trend blood pressures at home and if remains low we will need to put her on an agent like midodrine as she may have acquired autonomic dysfunction and the family relates that she has been weak and a little lightheaded.  She will continue her low-dose beta-blocker.  She has had no nausea or vomiting or obvious bleeding. Past Medical History:  Diagnosis Date  . Acute ischemic stroke (HCC) 11/08/2018  . Adjustment disorder with depressed mood 10/16/2019  . Alteration consciousness 03/08/2020  . Apraxia following cerebrovascular accident 10/16/2019  . B12 deficiency 10/16/2019  . Body mass index (BMI) less than 19 02/16/2020  . Cerebrovascular accident (CVA) (HCC) 03/08/2020  . Chronic kidney disease, stage II (mild) 12/06/2019  . Closed fracture of one rib of right side with routine healing 06/30/2017  . Confusion   . COPD (chronic obstructive pulmonary disease) (HCC) 11/16/2017  . Current use of long term anticoagulation 07/21/2017  . Dementia, multiinfarct, with behavioral disturbance (HCC) 01/18/2020  . Depression   . Essential hypertension, benign 10/16/2019  . First degree atrioventricular block 06/26/2017  . Hammer toe 06/26/2017  . Hearing loss   . Hyperlipidemia 06/26/2017  . Malnutrition of moderate degree (HCC) 12/06/2019  . Memory loss   . Other specified cardiac arrhythmias 06/26/2017  . Pain in limb 06/26/2017  . Palpitations 06/26/2017  . Paroxysmal atrial fibrillation (HCC) 06/26/2017   Formatting of this note might be different from the original. Overview:  remote history post operatively, CHADS 2 score=2 Formatting of this note might be different from the original. remote history post operatively, CHADS 2 score=2  . Premature atrial complex 06/26/2017  . Premature ventricular contractions 06/26/2017  . PSVT (paroxysmal supraventricular tachycardia) (HCC) 11/10/2017  . 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 Stroke (HCC)   . Thoracic aorta atherosclerosis (HCC) 05/22/2020  . Traumatic pneumothorax 06/30/2017    Past Surgical History:  Procedure Laterality Date  . ABDOMINAL HYSTERECTOMY    . SHOULDER SURGERY    . TONSILLECTOMY      Current Medications: Current Meds  Medication Sig  . acebutolol (SECTRAL) 200 MG capsule Take 1 capsule (200 mg total) by mouth daily.  [name] apixaban (ELIQUIS) 2.5 MG TABS tablet TAKE 1 TABLET(2.5 MG) BY MOUTH TWICE DAILY  . Apoaequorin (PREVAGEN PO) Take 1 tablet by mouth daily.  [name] atorvastatin (LIPITOR) 40 MG tablet TAKE 1 TABLET(40 MG) BY MOUTH DAILY  . Cholecalciferol (VITAMIN D3) 1000 units CAPS Take 1,000 Units by mouth daily.  . divalproex (DEPAKOTE ER) 500 MG 24 hr tablet Take 1 tablet (500 mg total) by mouth at bedtime.  . docusate sodium (COLACE) 100 MG capsule Take 100 mg by mouth daily as needed.  . mirtazapine (REMERON) 30 MG tablet Take 1 tablet (30 mg total) by mouth at bedtime.  [name] OVER THE COUNTER MEDICATION Take 1 tablet by mouth daily. Selenex GSH supplement  . prednisoLONE acetate (PRED FORTE) 1 % ophthalmic suspension SMARTSIG:1 Drop(s) Right Eye Twice Daily PRN  . vitamin B-12 (CYANOCOBALAMIN) 500 MCG tablet Take 500 mcg by mouth daily.      Allergies:   Codeine, Penicillins, and Sulfa antibiotics   Social History   Socioeconomic History  . Marital status: Widowed    Spouse name: Not on file  . Number of children: 2  . Years of education: 76  . Highest education level: High school graduate  Occupational History  . Occupation: Retired  Tobacco Use  . Smoking status: Never Smoker  . Smokeless tobacco: Never Used  Vaping Use  . Vaping Use: Never used  Substance and Sexual Activity  . Alcohol use: No  . Drug use: No  . Sexual activity: Not Currently  Other Topics Concern  . Not on file  Social History Narrative   [name] is widowed. Her daughter, [name], is her POA and  lives 12 miles away. She resides in her own home, alone for last 7 years. Since her CVA she has had someone with her most of the time. She has a hired caregiver or a family member stays most of the day. She feel safe in her home and in her neighborhood.   Right-handed.   0.5 cups caffeine per day.   Social Determinants of Health   Financial Resource Strain: Not on file  Food Insecurity: Not on file  Transportation Needs: Not on file  Physical Activity: Not on file  Stress: Not on file  Social Connections: Not on file     Family History: The patient's family history includes Alzheimer's disease in her sister; Bone cancer in her mother; Breast cancer in her father and sister; Dementia in her brother; Diabetes in her father; Heart disease in her father. ROS:   Please see the history of present illness.    All other systems reviewed and are negative.  EKGs/Labs/Other Studies Reviewed:    The following studies were reviewed today:  EKG:  EKG ordered today and personally reviewed.  The ekg ordered today demonstrates sinus rhythm and normal  Recent Labs: 05/22/2020: TSH 1.460 09/25/2020: ALT 21; BUN 17; Creatinine, Ser 0.78; Hemoglobin 12.3; Platelets 235; Potassium 5.1; Sodium 141  Recent Lipid Panel    Component Value Date/Time   CHOL 142 09/25/2020 1056   TRIG 93 09/25/2020 1056   HDL 45 09/25/2020 1056   CHOLHDL 3.2 09/25/2020 1056   CHOLHDL 4.3 11/09/2018 0512   VLDL 11 11/09/2018 0512   LDLCALC 79 09/25/2020 1056    Physical Exam:    VS:  BP (!) 90/50   Pulse 76   Ht 5\' 3"  (1.6 m)   Wt 108 lb 12.8 oz (49.4 kg)   LMP  (LMP Unknown)   SpO2 97%   BMI 19.27 kg/m     Wt Readings from Last 3 Encounters:  11/13/20 108 lb 12.8 oz (49.4 kg)  09/25/20 107 lb (48.5 kg)  09/24/20 108 lb 8 oz (49.2 kg)     GEN: She looks frail well nourished, well developed in no acute distress HEENT: Normal NECK: No JVD; No carotid bruits LYMPHATICS: No lymphadenopathy CARDIAC: RRR, no  murmurs, rubs, gallops RESPIRATORY:  Clear to auscultation without rales, wheezing or rhonchi  ABDOMEN: Soft, non-tender, non-distended MUSCULOSKELETAL:  No edema; No deformity  SKIN: Warm and dry NEUROLOGIC:  Alert and oriented x 3 PSYCHIATRIC:  Normal affect    Signed, 09/26/20, MD  11/13/2020 11:38 AM    [facility]

## 2020-11-13 ENCOUNTER — Other Ambulatory Visit: Payer: Self-pay

## 2020-11-13 ENCOUNTER — Ambulatory Visit: Payer: PPO | Admitting: Cardiology

## 2020-11-13 ENCOUNTER — Encounter: Payer: Self-pay | Admitting: Cardiology

## 2020-11-13 VITALS — BP 90/50 | HR 76 | Ht 63.0 in | Wt 108.8 lb

## 2020-11-13 DIAGNOSIS — I959 Hypotension, unspecified: Secondary | ICD-10-CM | POA: Diagnosis not present

## 2020-11-13 DIAGNOSIS — I9589 Other hypotension: Secondary | ICD-10-CM | POA: Diagnosis not present

## 2020-11-13 DIAGNOSIS — E7800 Pure hypercholesterolemia, unspecified: Secondary | ICD-10-CM

## 2020-11-13 DIAGNOSIS — Z7901 Long term (current) use of anticoagulants: Secondary | ICD-10-CM

## 2020-11-13 DIAGNOSIS — I480 Paroxysmal atrial fibrillation: Secondary | ICD-10-CM | POA: Diagnosis not present

## 2020-11-13 NOTE — Patient Instructions (Signed)
Medication Instructions:  Your physician recommends that you continue on your current medications as directed. Please refer to the Current Medication list given to you today.  *If you need a refill on your cardiac medications before your next appointment, please call your pharmacy*   Lab Work: Your physician recommends that you return for lab work in: TODAY CBC, BMP If you have labs (blood work) drawn today and your tests are completely normal, you will receive your results only by: . MyChart Message (if you have MyChart) OR . A paper copy in the mail If you have any lab test that is abnormal or we need to change your treatment, we will call you to review the results.   Testing/Procedures: None   Follow-Up: At [facility], you and your health needs are our priority.  As part of our continuing mission to provide you with exceptional heart care, we have created designated Provider Care Teams.  These Care Teams include your primary Cardiologist (physician) and Advanced Practice Providers (APPs -  Physician Assistants and Nurse Practitioners) who all work together to provide you with the care you need, when you need it.  We recommend signing up for the patient portal called "MyChart".  Sign up information is provided on this After Visit Summary.  MyChart is used to connect with patients for Virtual Visits (Telemedicine).  Patients are able to view lab/test results, encounter notes, upcoming appointments, etc.  Non-urgent messages can be sent to your provider as well.   To learn more about what you can do with MyChart, go to [email or web address].    Your next appointment:   6 month(s)  The format for your next appointment:   In Person  Provider:   Brian Munley, MD   Other Instructions   

## 2020-11-14 ENCOUNTER — Telehealth: Payer: Self-pay

## 2020-11-14 LAB — CORTISOL: Cortisol: 10.5 ug/dL

## 2020-11-14 LAB — CBC
Hematocrit: 35.6 % (ref 34.0–46.6)
Hemoglobin: 12.1 g/dL (ref 11.1–15.9)
MCH: 33.4 pg — ABNORMAL HIGH (ref 26.6–33.0)
MCHC: 34 g/dL (ref 31.5–35.7)
MCV: 98 fL — ABNORMAL HIGH (ref 79–97)
Platelets: 225 10*3/uL (ref 150–450)
RBC: 3.62 x10E6/uL — ABNORMAL LOW (ref 3.77–5.28)
RDW: 12.6 % (ref 11.7–15.4)
WBC: 5.4 10*3/uL (ref 3.4–10.8)

## 2020-11-14 LAB — BASIC METABOLIC PANEL
BUN/Creatinine Ratio: 29 — ABNORMAL HIGH (ref 12–28)
BUN: 20 mg/dL (ref 10–36)
CO2: 24 mmol/L (ref 20–29)
Calcium: 9.3 mg/dL (ref 8.7–10.3)
Chloride: 102 mmol/L (ref 96–106)
Creatinine, Ser: 0.7 mg/dL (ref 0.57–1.00)
Glucose: 92 mg/dL (ref 65–99)
Potassium: 4.6 mmol/L (ref 3.5–5.2)
Sodium: 143 mmol/L (ref 134–144)
eGFR: 81 mL/min/{1.73_m2} (ref >59)

## 2020-11-14 NOTE — Telephone Encounter (Signed)
Spoke with patient regarding results and recommendation.  Patient verbalizes understanding and is agreeable to plan of care. Advised patient to call back with any issues or concerns.  

## 2020-11-14 NOTE — Telephone Encounter (Signed)
-----   Message from Baldo Daub, MD sent at 11/14/2020  7:45 AM EDT ----- Good stable results

## 2020-12-07 ENCOUNTER — Telehealth: Payer: Self-pay

## 2020-12-07 NOTE — Progress Notes (Signed)
    Chronic Care Management Pharmacy Assistant   Name: [name]  MRN: [id number] DOB: 1925/10/12  Reason for Encounter: Medication Review for general adherence   Recent office visits:  11/13/20-Dr. Munley-cardiology-Afib-no medication changes-follow up 41mos 09/25/20-[name], CPP-follow up 10mos  Recent consult visits:  none   Hospital visits:  None in previous 6 months    Medications: Outpatient Encounter Medications as of 12/07/2020  Medication Sig  . acebutolol (SECTRAL) 200 MG capsule Take 1 capsule (200 mg total) by mouth daily.  [name] apixaban (ELIQUIS) 2.5 MG TABS tablet TAKE 1 TABLET(2.5 MG) BY MOUTH TWICE DAILY  . Apoaequorin (PREVAGEN PO) Take 1 tablet by mouth daily.  [name] atorvastatin (LIPITOR) 40 MG tablet TAKE 1 TABLET(40 MG) BY MOUTH DAILY  . Cholecalciferol (VITAMIN D3) 1000 units CAPS Take 1,000 Units by mouth daily.  . divalproex (DEPAKOTE ER) 500 MG 24 hr tablet Take 1 tablet (500 mg total) by mouth at bedtime.  . docusate sodium (COLACE) 100 MG capsule Take 100 mg by mouth daily as needed.  . mirtazapine (REMERON) 30 MG tablet Take 1 tablet (30 mg total) by mouth at bedtime.  [name] OVER THE COUNTER MEDICATION Take 1 tablet by mouth daily. Selenex GSH supplement  . prednisoLONE acetate (PRED FORTE) 1 % ophthalmic suspension SMARTSIG:1 Drop(s) Right Eye Twice Daily PRN  . vitamin B-12 (CYANOCOBALAMIN) 500 MCG tablet Take 500 mcg by mouth daily.    No facility-administered encounter medications on file as of 12/07/2020.    Multiple attempts made, unable to reach patient  [name], [facility] Clinical Pharmacist Assistant [phone]

## 2020-12-17 ENCOUNTER — Encounter: Payer: Self-pay | Admitting: Legal Medicine

## 2021-01-02 ENCOUNTER — Telehealth: Payer: Self-pay

## 2021-01-02 NOTE — Chronic Care Management (AMB) (Signed)
    Chronic Care Management Pharmacy Assistant   Name: LAUNI ASENCIO  MRN: [id number] DOB: Jul 04, 1926  Reason for Encounter: Disease State for General Adherence    Recent office visits:  12/17/20-Dr. Brent Bulla (PCP)-Telephone note- Patients daughter reported patient falling and hitting her head 3 weeks prior.   Recent consult visits:  11/13/20-Dr Norman Herrlich (cardiology) Labs performed. Recommended monitoring b/p at home. Course completed for Levetiracetam 250mg  bid.  Hospital visits:  None in previous 6 months  Medications: Outpatient Encounter Medications as of 01/02/2021  Medication Sig  . acebutolol (SECTRAL) 200 MG capsule Take 1 capsule (200 mg total) by mouth daily.  03/04/2021 apixaban (ELIQUIS) 2.5 MG TABS tablet TAKE 1 TABLET(2.5 MG) BY MOUTH TWICE DAILY  . Apoaequorin (PREVAGEN PO) Take 1 tablet by mouth daily.  [name] atorvastatin (LIPITOR) 40 MG tablet TAKE 1 TABLET(40 MG) BY MOUTH DAILY  . Cholecalciferol (VITAMIN D3) 1000 units CAPS Take 1,000 Units by mouth daily.  . divalproex (DEPAKOTE ER) 500 MG 24 hr tablet Take 1 tablet (500 mg total) by mouth at bedtime.  . docusate sodium (COLACE) 100 MG capsule Take 100 mg by mouth daily as needed.  . mirtazapine (REMERON) 30 MG tablet Take 1 tablet (30 mg total) by mouth at bedtime.  [name] OVER THE COUNTER MEDICATION Take 1 tablet by mouth daily. Selenex GSH supplement  . prednisoLONE acetate (PRED FORTE) 1 % ophthalmic suspension SMARTSIG:1 Drop(s) Right Eye Twice Daily PRN  . vitamin B-12 (CYANOCOBALAMIN) 500 MCG tablet Take 500 mcg by mouth daily.    No facility-administered encounter medications on file as of 01/02/2021.   Spoke to patient daughter 03/04/2021, she stated she is worried about her mom.  [name] stated that since her moms fall, she has noticed a daily decline with memory , stability and her gate.  [name] stated that after she hit her head on the concrete she refused to go get checked out.  [name] said the progression is daily, she  is having to do more and more for her mom, she is having to take over her bills and checkbook, which the patient was doing on her own before the fall.  [name] stated they are talking about getting someone to sit with her during the day.  [name] stated her mom is a picky eater, some days she eats well, other days not so much.  I advised [name] to speak to her mom, about coming in to see Dr. [name] to discuss the rapid changes.  She said she would call the office.  Star Rating Drugs: Atorvastatin 40mg  last filled 11/08/20 90DS  , [facility] Clinical Pharmacist Assistant [phone]

## 2021-01-06 ENCOUNTER — Encounter: Payer: Self-pay | Admitting: Legal Medicine

## 2021-01-07 ENCOUNTER — Other Ambulatory Visit: Payer: Self-pay | Admitting: Cardiology

## 2021-01-07 NOTE — Telephone Encounter (Signed)
Refill sent to pharmacy.   

## 2021-01-11 ENCOUNTER — Other Ambulatory Visit: Payer: Self-pay

## 2021-01-11 ENCOUNTER — Ambulatory Visit (INDEPENDENT_AMBULATORY_CARE_PROVIDER_SITE_OTHER): Payer: PPO | Admitting: Legal Medicine

## 2021-01-11 ENCOUNTER — Encounter: Payer: Self-pay | Admitting: Legal Medicine

## 2021-01-11 VITALS — BP 124/72 | HR 78 | Temp 96.8°F | Ht 64.0 in | Wt 105.4 lb

## 2021-01-11 DIAGNOSIS — L03039 Cellulitis of unspecified toe: Secondary | ICD-10-CM | POA: Diagnosis not present

## 2021-01-11 MED ORDER — CEPHALEXIN 500 MG PO CAPS: 500.0000 mg | ORAL_CAPSULE | Freq: Two times a day (BID) | ORAL | 0 refills | Status: DC

## 2021-01-11 NOTE — Progress Notes (Signed)
Acute Office Visit  Subjective:    Patient ID: [name], female    DOB: Aug 06, 1926, 87 y.o.   MRN: [id number]  Chief Complaint  Patient presents with  . Toe Pain    HPI Patient is in today for rt foot -LITTLE toe pain. Patient states it started hurting this week, denies any pain with walking, has some redness, tenderness and no drainage. Patient denies hitting her foot on anything. Toe is swollen, unknown if it was caused by a insected. She has a small puncture wound on right 5th toe with surrounding redness.  Past Medical History:  Diagnosis Date  . Acute ischemic stroke ([place]) 11/08/2018  . Adjustment disorder with depressed mood 10/16/2019  . Alteration consciousness 03/08/2020  . Apraxia following cerebrovascular accident 10/16/2019  . B12 deficiency 10/16/2019  . Body mass index (BMI) less than 19 02/16/2020  . Cerebrovascular accident (CVA) ([place]) 03/08/2020  . Chronic kidney disease, stage II (mild) 12/06/2019  . Closed fracture of one rib of right side with routine healing 06/30/2017  . Confusion   . COPD (chronic obstructive pulmonary disease) ([place]) 11/16/2017  . Current use of long term anticoagulation 07/21/2017  . Dementia, multiinfarct, with behavioral disturbance ([place]) 01/18/2020  . Depression   . Essential hypertension, benign 10/16/2019  . First degree atrioventricular block 06/26/2017  . Hammer toe 06/26/2017  . Hearing loss   . Hyperlipidemia 06/26/2017  . Malnutrition of moderate degree ([place]) 12/06/2019  . Memory loss   . Other specified cardiac arrhythmias 06/26/2017  . Pain in limb 06/26/2017  . Palpitations 06/26/2017  . Paroxysmal atrial fibrillation ([place]) 06/26/2017   Formatting of this note might be different from the original. Overview:  remote history post operatively, CHADS 2 score=2 Formatting of this note might be different from the original. remote history post operatively, CHADS 2 score=2  . Premature atrial complex 06/26/2017  . Premature ventricular  contractions 06/26/2017  . PSVT (paroxysmal supraventricular tachycardia) ([place]) 11/10/2017  . 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 Stroke ([place])   . Thoracic aorta atherosclerosis ([place]) 05/22/2020  . Traumatic pneumothorax 06/30/2017    Past Surgical History:  Procedure Laterality Date  . ABDOMINAL HYSTERECTOMY    . SHOULDER SURGERY    . TONSILLECTOMY      Family History  Problem Relation Age of Onset  . Bone cancer Mother   . Breast cancer Father   . Diabetes Father   . Heart disease Father   . Breast cancer Sister   . Dementia Brother   . Alzheimer's disease Sister     Social History   Socioeconomic History  . Marital status: Widowed    Spouse name: Not on file  . Number of children: 2  . Years of education: 12  . Highest education level: High school graduate  Occupational History  . Occupation: Retired  Tobacco Use  . Smoking status: Never Smoker  . Smokeless tobacco: Never Used  Vaping Use  . Vaping Use: Never used  Substance and Sexual Activity  . Alcohol use: No  . Drug use: No  . Sexual activity: Not Currently  Other Topics Concern  . Not on file  Social History Narrative   Mrs. Baggett is widowed. Her daughter, [name], is her POA and lives 12 miles away. She resides in her own home, alone for last 7 years. Since her CVA she has had someone with her most of the time. She has a hired caregiver or a family member stays  most of the day. She feel safe in her home and in her neighborhood.   Right-handed.   0.5 cups caffeine per day.   Social Determinants of Health   Financial Resource Strain: Not on file  Food Insecurity: Not on file  Transportation Needs: Not on file  Physical Activity: Not on file  Stress: Not on file  Social Connections: Not on file  Intimate Partner Violence: Not on file    Outpatient Medications Prior to Visit  Medication Sig Dispense Refill  . acebutolol (SECTRAL) 200 MG capsule TAKE 1 CAPSULE(200 MG)  BY MOUTH DAILY 90 capsule 3  . apixaban (ELIQUIS) 2.5 MG TABS tablet TAKE 1 TABLET(2.5 MG) BY MOUTH TWICE DAILY 180 tablet 3  . Apoaequorin (PREVAGEN PO) Take 1 tablet by mouth daily.    [name] atorvastatin (LIPITOR) 40 MG tablet TAKE 1 TABLET(40 MG) BY MOUTH DAILY 90 tablet 2  . Cholecalciferol (VITAMIN D3) 1000 units CAPS Take 1,000 Units by mouth daily.    . divalproex (DEPAKOTE ER) 500 MG 24 hr tablet Take 1 tablet (500 mg total) by mouth at bedtime. 30 tablet 11  . docusate sodium (COLACE) 100 MG capsule Take 100 mg by mouth daily as needed.    . mirtazapine (REMERON) 30 MG tablet Take 1 tablet (30 mg total) by mouth at bedtime. 90 tablet 1  . OVER THE COUNTER MEDICATION Take 1 tablet by mouth daily. Selenex GSH supplement    . prednisoLONE acetate (PRED FORTE) 1 % ophthalmic suspension SMARTSIG:1 Drop(s) Right Eye Twice Daily PRN    . vitamin B-12 (CYANOCOBALAMIN) 500 MCG tablet Take 500 mcg by mouth daily.      No facility-administered medications prior to visit.    Allergies  Allergen Reactions  . Codeine Nausea And Vomiting  . Penicillins Rash  . Sulfa Antibiotics Nausea And Vomiting    Review of Systems  Constitutional: Negative for chills, fatigue and fever.  HENT: Negative for congestion, ear pain, postnasal drip, rhinorrhea, sinus pressure, sinus pain and sore throat.   Respiratory: Negative for cough and shortness of breath.   Cardiovascular: Negative for chest pain.  Skin: Positive for wound (right 5th toe).       Objective:    Physical Exam Vitals reviewed.  Constitutional:      Appearance: Normal appearance.  HENT:     Head: Normocephalic.  Cardiovascular:     Rate and Rhythm: Normal rate and regular rhythm.     Pulses: Normal pulses.     Heart sounds: Normal heart sounds. No murmur heard. No gallop.   Pulmonary:     Effort: Pulmonary effort is normal. No respiratory distress.     Breath sounds: No rales.  Musculoskeletal:     Comments: Left fifth toe red  and tender  Neurological:     Mental Status: She is alert.     BP 124/72   Pulse 78   Temp (!) 96.8 F (36 C)   Ht _0  (1.626 m)   Wt 105 lb 6.4 oz (47.8 kg)   LMP  (LMP Unknown)   SpO2 100%   BMI 18.09 kg/m  Wt Readings from Last 3 Encounters:  01/11/21 105 lb 6.4 oz (47.8 kg)  11/13/20 108 lb 12.8 oz (49.4 kg)  09/25/20 107 lb (48.5 kg)    Health Maintenance Due  Topic Date Due  . DEXA SCAN  Never done  . PNA vac Low Risk Adult (2 of 2 - PCV13) 08/31/2019  . COVID-19 Vaccine (3 -  Booster for [employer] series) 03/01/2020    There are no preventive care reminders to display for this patient.   Lab Results  Component Value Date   TSH 1.460 05/22/2020   Lab Results  Component Value Date   WBC 5.4 11/13/2020   HGB 12.1 11/13/2020   HCT 35.6 11/13/2020   MCV 98 (H) 11/13/2020   PLT 225 11/13/2020   Lab Results  Component Value Date   NA 143 11/13/2020   K 4.6 11/13/2020   CO2 24 11/13/2020   GLUCOSE 92 11/13/2020   BUN 20 11/13/2020   CREATININE 0.70 11/13/2020   BILITOT 0.4 09/25/2020   ALKPHOS 82 09/25/2020   AST 26 09/25/2020   ALT 21 09/25/2020   PROT 6.7 09/25/2020   ALBUMIN 4.2 09/25/2020   CALCIUM 9.3 11/13/2020   ANIONGAP 6 11/09/2018   EGFR 81 11/13/2020   Lab Results  Component Value Date   CHOL 142 09/25/2020   Lab Results  Component Value Date   HDL 45 09/25/2020   Lab Results  Component Value Date   LDLCALC 79 09/25/2020   Lab Results  Component Value Date   TRIG 93 09/25/2020   Lab Results  Component Value Date   CHOLHDL 3.2 09/25/2020   Lab Results  Component Value Date   HGBA1C 5.3 11/09/2018       Assessment & Plan:  Diagnoses and all orders for this visit: Cellulitis of fifth toe -     cephALEXin (KEFLEX) 500 MG capsule; Take 1 capsule (500 mg total) by mouth 2 (two) times daily. Soak toe twice a day and use antibiotics, follow up one week      I spent 52mnutes dedicated to the care of this patient on the  date of this encounter to include face-to-face time with the patient, as well as:   Follow-up: Return in about 1 week (around 01/18/2021) for for toe infection.  An After Visit Summary was printed and given to the patient.  LReinaldo Meeker MD [facility] [phone]

## 2021-01-15 ENCOUNTER — Ambulatory Visit: Payer: PPO | Admitting: Neurology

## 2021-01-17 ENCOUNTER — Encounter: Payer: Self-pay | Admitting: Legal Medicine

## 2021-01-18 ENCOUNTER — Ambulatory Visit: Payer: PPO | Admitting: Legal Medicine

## 2021-01-22 ENCOUNTER — Ambulatory Visit (INDEPENDENT_AMBULATORY_CARE_PROVIDER_SITE_OTHER): Payer: PPO | Admitting: Legal Medicine

## 2021-01-22 ENCOUNTER — Encounter: Payer: Self-pay | Admitting: Legal Medicine

## 2021-01-22 ENCOUNTER — Other Ambulatory Visit: Payer: Self-pay

## 2021-01-22 VITALS — BP 112/72 | HR 77 | Temp 95.4°F | Ht 64.0 in | Wt 105.0 lb

## 2021-01-22 DIAGNOSIS — L03039 Cellulitis of unspecified toe: Secondary | ICD-10-CM | POA: Diagnosis not present

## 2021-01-22 MED ORDER — SILVER SULFADIAZINE 1 % EX CREA: 1.0000 "application " | TOPICAL_CREAM | Freq: Every day | CUTANEOUS | 0 refills | Status: DC

## 2021-01-22 NOTE — Progress Notes (Signed)
Acute Office Visit  Subjective:    Patient ID: [name], female    DOB: [birth year], 87 y.o.   MRN: [id number]  Chief Complaint  Patient presents with  . Cellulitis of Toe    HPI Patient is in today for a f/u of her right 5th toe, finished antibiotic yesterday, states toe has improved but is tender to the touch.  Reports that her cat scratched her left upper leg yesterday, cleaned area with alcohol and covered with a bandaid.  Past Medical History:  Diagnosis Date  . Acute ischemic stroke ([place]) 11/08/2018  . Adjustment disorder with depressed mood 10/16/2019  . Alteration consciousness 03/08/2020  . Apraxia following cerebrovascular accident 10/16/2019  . B12 deficiency 10/16/2019  . Body mass index (BMI) less than 19 02/16/2020  . Cerebrovascular accident (CVA) ([place]) 03/08/2020  . Chronic kidney disease, stage II (mild) 12/06/2019  . Closed fracture of one rib of right side with routine healing 06/30/2017  . Confusion   . COPD (chronic obstructive pulmonary disease) ([place]) 11/16/2017  . Current use of long term anticoagulation 07/21/2017  . Dementia, multiinfarct, with behavioral disturbance ([place]) 01/18/2020  . Depression   . Essential hypertension, benign 10/16/2019  . First degree atrioventricular block 06/26/2017  . Hammer toe 06/26/2017  . Hearing loss   . Hyperlipidemia 06/26/2017  . Malnutrition of moderate degree ([place]) 12/06/2019  . Memory loss   . Other specified cardiac arrhythmias 06/26/2017  . Pain in limb 06/26/2017  . Palpitations 06/26/2017  . Paroxysmal atrial fibrillation ([place]) 06/26/2017   Formatting of this note might be different from the original. Overview:  remote history post operatively, CHADS 2 score=2 Formatting of this note might be different from the original. remote history post operatively, CHADS 2 score=2  . Premature atrial complex 06/26/2017  . Premature ventricular contractions 06/26/2017  . PSVT (paroxysmal supraventricular tachycardia) ([place])  11/10/2017  . 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 Stroke ([place])   . Thoracic aorta atherosclerosis ([place]) 05/22/2020  . Traumatic pneumothorax 06/30/2017    Past Surgical History:  Procedure Laterality Date  . ABDOMINAL HYSTERECTOMY    . SHOULDER SURGERY    . TONSILLECTOMY      Family History  Problem Relation Age of Onset  . Bone cancer Mother   . Breast cancer Father   . Diabetes Father   . Heart disease Father   . Breast cancer Sister   . Dementia Brother   . Alzheimer's disease Sister     Social History   Socioeconomic History  . Marital status: Widowed    Spouse name: Not on file  . Number of children: 2  . Years of education: 4  . Highest education level: High school graduate  Occupational History  . Occupation: Retired  Tobacco Use  . Smoking status: Never Smoker  . Smokeless tobacco: Never Used  Vaping Use  . Vaping Use: Never used  Substance and Sexual Activity  . Alcohol use: No  . Drug use: No  . Sexual activity: Not Currently  Other Topics Concern  . Not on file  Social History Narrative   Mrs. Arciga is widowed. Her daughter, [name], is her POA and lives 12 miles away. She resides in her own home, alone for last 7 years. Since her CVA she has had someone with her most of the time. She has a hired caregiver or a family member stays most of the day. She feel safe in her home and in her neighborhood.  Right-handed.   0.5 cups caffeine per day.   Social Determinants of Health   Financial Resource Strain: Not on file  Food Insecurity: Not on file  Transportation Needs: Not on file  Physical Activity: Not on file  Stress: Not on file  Social Connections: Not on file  Intimate Partner Violence: Not on file    Outpatient Medications Prior to Visit  Medication Sig Dispense Refill  . acebutolol (SECTRAL) 200 MG capsule TAKE 1 CAPSULE(200 MG) BY MOUTH DAILY 90 capsule 3  . apixaban (ELIQUIS) 2.5 MG TABS tablet TAKE 1  TABLET(2.5 MG) BY MOUTH TWICE DAILY 180 tablet 3  . Apoaequorin (PREVAGEN PO) Take 1 tablet by mouth daily.    [name] atorvastatin (LIPITOR) 40 MG tablet TAKE 1 TABLET(40 MG) BY MOUTH DAILY 90 tablet 2  . Cholecalciferol (VITAMIN D3) 1000 units CAPS Take 1,000 Units by mouth daily.    . divalproex (DEPAKOTE ER) 500 MG 24 hr tablet Take 1 tablet (500 mg total) by mouth at bedtime. 30 tablet 11  . docusate sodium (COLACE) 100 MG capsule Take 100 mg by mouth daily as needed.    . mirtazapine (REMERON) 30 MG tablet Take 1 tablet (30 mg total) by mouth at bedtime. 90 tablet 1  . OVER THE COUNTER MEDICATION Take 1 tablet by mouth daily. Selenex GSH supplement    . prednisoLONE acetate (PRED FORTE) 1 % ophthalmic suspension SMARTSIG:1 Drop(s) Right Eye Twice Daily PRN    . vitamin B-12 (CYANOCOBALAMIN) 500 MCG tablet Take 500 mcg by mouth daily.     . cephALEXin (KEFLEX) 500 MG capsule Take 1 capsule (500 mg total) by mouth 2 (two) times daily. 20 capsule 0   No facility-administered medications prior to visit.    Allergies  Allergen Reactions  . Codeine Nausea And Vomiting  . Penicillins Rash  . Sulfa Antibiotics Nausea And Vomiting    Review of Systems  Constitutional: Negative for chills, fatigue and fever.  HENT: Negative for congestion, ear pain, postnasal drip, rhinorrhea, sinus pressure, sinus pain and sore throat.   Respiratory: Negative for cough and shortness of breath.   Cardiovascular: Negative for chest pain.  Skin: Positive for wound (cat scratch on left leg).       Objective:    Physical Exam Vitals reviewed.  Constitutional:      Appearance: Normal appearance.  HENT:     Head: Normocephalic.     Right Ear: Tympanic membrane, ear canal and external ear normal.     Left Ear: Ear canal normal.     Nose: Nose normal.     Mouth/Throat:     Mouth: Mucous membranes are moist.  Cardiovascular:     Rate and Rhythm: Normal rate and regular rhythm.     Pulses: Normal pulses.      Heart sounds: Normal heart sounds. No murmur heard. No gallop.   Pulmonary:     Effort: Pulmonary effort is normal.     Breath sounds: Normal breath sounds.  Musculoskeletal:        General: Normal range of motion.  Skin:    Findings: Erythema and lesion present.     Comments: Cat scratch left upper leg 3 cm.  Dressed, the th toe is well healed and no infection  Neurological:     General: No focal deficit present.     Mental Status: She is alert.     BP 112/72   Pulse 77   Temp (!) 95.4 F (35.2 C)  Ht 5' 4"  (1.626 m)   Wt 105 lb (47.6 kg)   LMP  (LMP Unknown)   SpO2 99%   BMI 18.02 kg/m  Wt Readings from Last 3 Encounters:  01/22/21 105 lb (47.6 kg)  01/11/21 105 lb 6.4 oz (47.8 kg)  11/13/20 108 lb 12.8 oz (49.4 kg)    Health Maintenance Due  Topic Date Due  . DEXA SCAN  Never done  . PNA vac Low Risk Adult (2 of 2 - PCV13) 08/31/2019  . COVID-19 Vaccine (3 - Booster for [employer] series) 03/01/2020    There are no preventive care reminders to display for this patient.   Lab Results  Component Value Date   TSH 1.460 05/22/2020   Lab Results  Component Value Date   WBC 5.4 11/13/2020   HGB 12.1 11/13/2020   HCT 35.6 11/13/2020   MCV 98 (H) 11/13/2020   PLT 225 11/13/2020   Lab Results  Component Value Date   NA 143 11/13/2020   K 4.6 11/13/2020   CO2 24 11/13/2020   GLUCOSE 92 11/13/2020   BUN 20 11/13/2020   CREATININE 0.70 11/13/2020   BILITOT 0.4 09/25/2020   ALKPHOS 82 09/25/2020   AST 26 09/25/2020   ALT 21 09/25/2020   PROT 6.7 09/25/2020   ALBUMIN 4.2 09/25/2020   CALCIUM 9.3 11/13/2020   ANIONGAP 6 11/09/2018   EGFR 81 11/13/2020   Lab Results  Component Value Date   CHOL 142 09/25/2020   Lab Results  Component Value Date   HDL 45 09/25/2020   Lab Results  Component Value Date   LDLCALC 79 09/25/2020   Lab Results  Component Value Date   TRIG 93 09/25/2020   Lab Results  Component Value Date   CHOLHDL 3.2 09/25/2020    Lab Results  Component Value Date   HGBA1C 5.3 11/09/2018       Assessment & Plan:  Diagnoses and all orders for this visit: Cellulitis of fifth toe Other orders -     silver sulfADIAZINE (SILVADENE) 1 % cream; Apply 1 application topically daily. 5th toe is healing well, no infection, she has a cat scratch on left leg that is dressed        I spent 20 minutes dedicated to the care of this patient on the date of this encounter to include face-to-face time with the patient, as well as:   Follow-up: Return as scheduled.  An After Visit Summary was printed and given to the patient.  Reinaldo Meeker, MD [facility] [phone]

## 2021-02-11 ENCOUNTER — Encounter: Payer: Self-pay | Admitting: Legal Medicine

## 2021-02-12 ENCOUNTER — Other Ambulatory Visit: Payer: Self-pay

## 2021-02-12 DIAGNOSIS — F01518 Vascular dementia, unspecified severity, with other behavioral disturbance: Secondary | ICD-10-CM

## 2021-02-12 NOTE — Telephone Encounter (Signed)
Refer to [facility] geriatrics for consultation on dementia lp

## 2021-02-12 NOTE — Telephone Encounter (Signed)
I put the order in

## 2021-02-14 ENCOUNTER — Encounter: Payer: Self-pay | Admitting: Legal Medicine

## 2021-02-14 ENCOUNTER — Other Ambulatory Visit: Payer: Self-pay

## 2021-02-14 ENCOUNTER — Ambulatory Visit (INDEPENDENT_AMBULATORY_CARE_PROVIDER_SITE_OTHER): Payer: PPO | Admitting: Legal Medicine

## 2021-02-14 VITALS — BP 110/80 | Temp 97.0°F | Resp 16 | Ht 64.0 in | Wt 104.0 lb

## 2021-02-14 DIAGNOSIS — L03031 Cellulitis of right toe: Secondary | ICD-10-CM

## 2021-02-14 MED ORDER — CEPHALEXIN 500 MG PO CAPS
500.0000 mg | ORAL_CAPSULE | Freq: Four times a day (QID) | ORAL | 1 refills | Status: DC
Start: 2021-02-14 — End: 2021-03-07

## 2021-02-14 NOTE — Progress Notes (Signed)
Established Patient Office Visit  Subjective:  Patient ID: [name], female    DOB: 29-Mar-1926  Age: 87 y.o. MRN: [id number]  CC:  Chief Complaint  Patient presents with   Toe Pain    Patient mentioned soreness on fifth toe on the right foot.    HPI [name] presents for recurrent infection right 5th digit.  It is tender to touch.  No fever or chills  Past Medical History:  Diagnosis Date   Acute ischemic stroke ([place]) 11/08/2018   Adjustment disorder with depressed mood 10/16/2019   Alteration consciousness 03/08/2020   Apraxia following cerebrovascular accident 10/16/2019   B12 deficiency 10/16/2019   Body mass index (BMI) less than 19 02/16/2020   Cerebrovascular accident (CVA) ([place]) 03/08/2020   Chronic kidney disease, stage II (mild) 12/06/2019   Closed fracture of one rib of right side with routine healing 06/30/2017   Confusion    COPD (chronic obstructive pulmonary disease) ([place]) 11/16/2017   Current use of long term anticoagulation 07/21/2017   Dementia, multiinfarct, with behavioral disturbance ([place]) 01/18/2020   Depression    Essential hypertension, benign 10/16/2019   First degree atrioventricular block 06/26/2017   Hammer toe 06/26/2017   Hearing loss    Hyperlipidemia 06/26/2017   Malnutrition of moderate degree ([place]) 12/06/2019   Memory loss    Other specified cardiac arrhythmias 06/26/2017   Pain in limb 06/26/2017   Palpitations 06/26/2017   Paroxysmal atrial fibrillation ([place]) 06/26/2017   Formatting of this note might be different from the original. Overview:  remote history post operatively, CHADS 2 score=2 Formatting of this note might be different from the original. 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 10/16/2019   Stroke [facility])    Thoracic  aorta atherosclerosis ([place]) 05/22/2020   Traumatic pneumothorax 06/30/2017    Past Surgical History:  Procedure Laterality Date   ABDOMINAL HYSTERECTOMY     SHOULDER SURGERY     TONSILLECTOMY      Family History  Problem Relation Age of Onset   Bone cancer Mother    Breast cancer Father    Diabetes Father    Heart disease Father    Breast cancer Sister    Dementia Brother    Alzheimer's disease Sister     Social History   Socioeconomic History   Marital status: Widowed    Spouse name: Not on file   Number of children: 2   Years of education: 12   Highest education level: High school graduate  Occupational History   Occupation: Retired  Tobacco Use   Smoking status: Never   Smokeless tobacco: Never  Vaping Use   Vaping Use: Never used  Substance and Sexual Activity   Alcohol use: No   Drug use: No   Sexual activity: Not Currently  Other Topics Concern   Not on file  Social History Narrative   [name] is widowed. Her daughter, [name], is her POA and lives 12 miles away. She resides in her own home, alone for last 7 years. Since her CVA she has had someone with her most of the time. She has a hired caregiver or a family member stays most of the day. She feel safe in her home and in her neighborhood.   Right-handed.   0.5 cups caffeine per day.   Social  Determinants of Health   Financial Resource Strain: Not on file  Food Insecurity: Not on file  Transportation Needs: Not on file  Physical Activity: Not on file  Stress: Not on file  Social Connections: Not on file  Intimate Partner Violence: Not on file    Outpatient Medications Prior to Visit  Medication Sig Dispense Refill   acebutolol (SECTRAL) 200 MG capsule TAKE 1 CAPSULE(200 MG) BY MOUTH DAILY 90 capsule 3   apixaban (ELIQUIS) 2.5 MG TABS tablet TAKE 1 TABLET(2.5 MG) BY MOUTH TWICE DAILY 180 tablet 3   Apoaequorin (PREVAGEN PO) Take 1 tablet by mouth daily.     atorvastatin (LIPITOR) 40 MG  tablet TAKE 1 TABLET(40 MG) BY MOUTH DAILY 90 tablet 2   Cholecalciferol (VITAMIN D3) 1000 units CAPS Take 1,000 Units by mouth daily.     divalproex (DEPAKOTE ER) 500 MG 24 hr tablet Take 1 tablet (500 mg total) by mouth at bedtime. 30 tablet 11   mirtazapine (REMERON) 30 MG tablet Take 1 tablet (30 mg total) by mouth at bedtime. 90 tablet 1   Multiple Vitamins-Minerals (PRESERVISION AREDS 2) CAPS Take by mouth.     OVER THE COUNTER MEDICATION Take 1 tablet by mouth daily. Selenex GSH supplement     prednisoLONE acetate (PRED FORTE) 1 % ophthalmic suspension SMARTSIG:1 Drop(s) Right Eye Twice Daily PRN     silver sulfADIAZINE (SILVADENE) 1 % cream Apply 1 application topically daily. 50 g 0   UNABLE TO FIND Med Name: [place] Take one tablet one day.     vitamin B-12 (CYANOCOBALAMIN) 500 MCG tablet Take 500 mcg by mouth daily.      docusate sodium (COLACE) 100 MG capsule Take 100 mg by mouth daily as needed.     No facility-administered medications prior to visit.    Allergies  Allergen Reactions   Codeine Nausea And Vomiting   Penicillins Rash   Sulfa Antibiotics Nausea And Vomiting    ROS Review of Systems  Constitutional:  Negative for activity change and appetite change.  HENT:  Negative for congestion.   Eyes:  Negative for visual disturbance.  Respiratory:  Negative for chest tightness and shortness of breath.   Gastrointestinal:  Negative for abdominal distention and abdominal pain.  Endocrine: Negative for polyuria.  Genitourinary:  Negative for difficulty urinating and dysuria.  Musculoskeletal:  Negative for arthralgias and back pain.  Neurological: Negative.   Psychiatric/Behavioral: Negative.       Objective:    Physical Exam Vitals reviewed.  Constitutional:      Appearance: Normal appearance.  Cardiovascular:     Rate and Rhythm: Normal rate and regular rhythm.     Pulses: Normal pulses.     Heart sounds: Normal heart sounds. No murmur heard.   No gallop.   Pulmonary:     Effort: Pulmonary effort is normal.     Breath sounds: Normal breath sounds.  Musculoskeletal:        General: Swelling and tenderness present.     Comments: Right fifth toe red and tender  Neurological:     Mental Status: She is alert.    BP 110/80   Temp (!) 97 F (36.1 C)   Resp 16   Ht 5' 4"  (1.626 m)   Wt 104 lb (47.2 kg)   LMP  (LMP Unknown)   BMI 17.85 kg/m  Wt Readings from Last 3 Encounters:  02/14/21 104 lb (47.2 kg)  01/22/21 105 lb (47.6 kg)  01/11/21 105 lb 6.4  oz (47.8 kg)     Health Maintenance Due  Topic Date Due   Zoster Vaccines- Shingrix (1 of 2) Never done   DEXA SCAN  Never done   PNA vac Low Risk Adult (2 of 2 - PCV13) 08/31/2019   COVID-19 Vaccine (3 - Pfizer risk series) 10/31/2019    There are no preventive care reminders to display for this patient.  Lab Results  Component Value Date   TSH 1.460 05/22/2020   Lab Results  Component Value Date   WBC 5.4 11/13/2020   HGB 12.1 11/13/2020   HCT 35.6 11/13/2020   MCV 98 (H) 11/13/2020   PLT 225 11/13/2020   Lab Results  Component Value Date   NA 143 11/13/2020   K 4.6 11/13/2020   CO2 24 11/13/2020   GLUCOSE 92 11/13/2020   BUN 20 11/13/2020   CREATININE 0.70 11/13/2020   BILITOT 0.4 09/25/2020   ALKPHOS 82 09/25/2020   AST 26 09/25/2020   ALT 21 09/25/2020   PROT 6.7 09/25/2020   ALBUMIN 4.2 09/25/2020   CALCIUM 9.3 11/13/2020   ANIONGAP 6 11/09/2018   EGFR 81 11/13/2020   Lab Results  Component Value Date   CHOL 142 09/25/2020   Lab Results  Component Value Date   HDL 45 09/25/2020   Lab Results  Component Value Date   LDLCALC 79 09/25/2020   Lab Results  Component Value Date   TRIG 93 09/25/2020   Lab Results  Component Value Date   CHOLHDL 3.2 09/25/2020   Lab Results  Component Value Date   HGBA1C 5.3 11/09/2018      Assessment & Plan:   Diagnoses and all orders for this visit: Cellulitis of toe of right foot -     DG Foot  Complete Right -     Ambulatory referral to Podiatry -     cephALEXin (KEFLEX) 500 MG capsule; Take 1 capsule (500 mg total) by mouth 4 (four) times daily.  Recurrent cellulitis right 5th toe, concern for osteomyelitis, get x-ray and restart keflex.  Podiatry referral  Meds ordered this encounter  Medications   cephALEXin (KEFLEX) 500 MG capsule    Sig: Take 1 capsule (500 mg total) by mouth 4 (four) times daily.    Dispense:  20 capsule    Refill:  1    Follow-up: Return in about 1 week (around 02/21/2021).    Reinaldo Meeker, MD

## 2021-02-18 DIAGNOSIS — L03031 Cellulitis of right toe: Secondary | ICD-10-CM | POA: Diagnosis not present

## 2021-02-18 DIAGNOSIS — M7731 Calcaneal spur, right foot: Secondary | ICD-10-CM | POA: Diagnosis not present

## 2021-02-18 DIAGNOSIS — M7989 Other specified soft tissue disorders: Secondary | ICD-10-CM | POA: Diagnosis not present

## 2021-03-01 ENCOUNTER — Encounter: Payer: Self-pay | Admitting: Legal Medicine

## 2021-03-01 ENCOUNTER — Other Ambulatory Visit: Payer: Self-pay

## 2021-03-01 DIAGNOSIS — F01518 Vascular dementia, unspecified severity, with other behavioral disturbance: Secondary | ICD-10-CM

## 2021-03-01 NOTE — Telephone Encounter (Signed)
Refer to [facility] nerology for dementia Dr. Arbutus Leas lp

## 2021-03-07 ENCOUNTER — Ambulatory Visit (INDEPENDENT_AMBULATORY_CARE_PROVIDER_SITE_OTHER): Payer: PPO | Admitting: Nurse Practitioner

## 2021-03-07 ENCOUNTER — Other Ambulatory Visit: Payer: Self-pay

## 2021-03-07 ENCOUNTER — Encounter: Payer: Self-pay | Admitting: Legal Medicine

## 2021-03-07 ENCOUNTER — Encounter: Payer: Self-pay | Admitting: Nurse Practitioner

## 2021-03-07 VITALS — BP 122/62 | HR 72 | Temp 97.6°F | Ht 64.0 in | Wt 104.0 lb

## 2021-03-07 DIAGNOSIS — N3001 Acute cystitis with hematuria: Secondary | ICD-10-CM | POA: Diagnosis not present

## 2021-03-07 DIAGNOSIS — R54 Age-related physical debility: Secondary | ICD-10-CM | POA: Diagnosis not present

## 2021-03-07 DIAGNOSIS — R300 Dysuria: Secondary | ICD-10-CM

## 2021-03-07 DIAGNOSIS — R296 Repeated falls: Secondary | ICD-10-CM | POA: Diagnosis not present

## 2021-03-07 LAB — POCT URINALYSIS DIPSTICK
Bilirubin, UA: NEGATIVE
Glucose, UA: NEGATIVE
Ketones, UA: NEGATIVE
Nitrite, UA: NEGATIVE
Protein, UA: POSITIVE — AB
Spec Grav, UA: >=1.03 — AB (ref 1.010–1.025)
Urobilinogen, UA: NEGATIVE E.U./dL — AB
pH, UA: 6 (ref 5.0–8.0)

## 2021-03-07 MED ORDER — LEVOFLOXACIN 250 MG PO TABS: 250.0000 mg | ORAL_TABLET | Freq: Every day | ORAL | 0 refills | Status: DC

## 2021-03-07 NOTE — Patient Instructions (Addendum)
Push fluids, especially water Change soap from [place] to [place] Take Levaquin 250 mg daily for 3 days Notify office if symptoms fail to improve or worsen Fall precautions at home Change positions slowly Follow-up as needed  Fall Prevention in the Home, Adult Falls can cause injuries and can happen to people of all ages. There are many things you can do to make your home safe and to help prevent falls. Ask forhelp when making these changes. What actions can I take to prevent falls? General Instructions Use good lighting in all rooms. Replace any light bulbs that burn out. Turn on the lights in dark areas. Use night-lights. Keep items that you use often in easy-to-reach places. Lower the shelves around your home if needed. Set up your furniture so you have a clear path. Avoid moving your furniture around. Do not have throw rugs or other things on the floor that can make you trip. Avoid walking on wet floors. If any of your floors are uneven, fix them. Add color or contrast paint or tape to clearly mark and help you see: Grab bars or handrails. First and last steps of staircases. Where the edge of each step is. If you use a stepladder: Make sure that it is fully opened. Do not climb a closed stepladder. Make sure the sides of the stepladder are locked in place. Ask someone to hold the stepladder while you use it. Know where your pets are when moving through your home. What can I do in the bathroom?     Keep the floor dry. Clean up any water on the floor right away. Remove soap buildup in the tub or shower. Use nonskid mats or decals on the floor of the tub or shower. Attach bath mats securely with double-sided, nonslip rug tape. If you need to sit down in the shower, use a plastic, nonslip stool. Install grab bars by the toilet and in the tub and shower. Do not use towel bars as grab bars. What can I do in the bedroom? Make sure that you have a light by your bed that is easy to  reach. Do not use any sheets or blankets for your bed that hang to the floor. Have a firm chair with side arms that you can use for support when you get dressed. What can I do in the kitchen? Clean up any spills right away. If you need to reach something above you, use a step stool with a grab bar. Keep electrical cords out of the way. Do not use floor polish or wax that makes floors slippery. What can I do with my stairs? Do not leave any items on the stairs. Make sure that you have a light switch at the top and the bottom of the stairs. Make sure that there are handrails on both sides of the stairs. Fix handrails that are broken or loose. Install nonslip stair treads on all your stairs. Avoid having throw rugs at the top or bottom of the stairs. Choose a carpet that does not hide the edge of the steps on the stairs. Check carpeting to make sure that it is firmly attached to the stairs. Fix carpet that is loose or worn. What can I do on the outside of my home? Use bright outdoor lighting. Fix the edges of walkways and driveways and fix any cracks. Remove anything that might make you trip as you walk through a door, such as a raised step or threshold. Trim any bushes or trees on  paths to your home. Check to see if handrails are loose or broken and that both sides of all steps have handrails. Install guardrails along the edges of any raised decks and porches. Clear paths of anything that can make you trip, such as tools or rocks. Have leaves, snow, or ice cleared regularly. Use sand or salt on paths during winter. Clean up any spills in your garage right away. This includes grease or oil spills. What other actions can I take? Wear shoes that: Have a low heel. Do not wear high heels. Have rubber bottoms. Feel good on your feet and fit well. Are closed at the toe. Do not wear open-toe sandals. Use tools that help you move around if needed. These  include: Canes. Walkers. Scooters. Crutches. Review your medicines with your doctor. Some medicines can make you feel dizzy. This can increase your chance of falling. Ask your doctor what else you can do to help prevent falls. Where to find more information Centers for Disease Control and Prevention, [employer]: FootballExhibition.com.br [employer] on Aging: https://walker.com/ Contact a doctor if: You are afraid of falling at home. You feel weak, drowsy, or dizzy at home. You fall at home. Summary There are many simple things that you can do to make your home safe and to help prevent falls. Ways to make your home safe include removing things that can make you trip and installing grab bars in the bathroom. Ask for help when making these changes in your home. This information is not intended to replace advice given to you by your health care provider. Make sure you discuss any questions you have with your healthcare provider. Document Revised: 03/21/2020 Document Reviewed: 03/21/2020 Elsevier Patient Education  2022 Elsevier Inc.   Urinary Tract Infection, Adult A urinary tract infection (UTI) is an infection of any part of the urinary tract. The urinary tract includes: The kidneys. The ureters. The bladder. The urethra. These organs make, store, and get rid of pee (urine) in the body. What are the causes? This infection is caused by germs (bacteria) in your genital area. These germs grow and cause swelling (inflammation) of your urinary tract. What increases the risk? The following factors may make you more likely to develop this condition: Using a small, thin tube (catheter) to drain pee. Not being able to control when you pee or poop (incontinence). Being female. If you are female, these things can increase the risk: Using these methods to prevent pregnancy: A medicine that kills sperm (spermicide). A device that blocks sperm (diaphragm). Having low levels of a female hormone  (estrogen). Being pregnant. You are more likely to develop this condition if: You have genes that add to your risk. You are sexually active. You take antibiotic medicines. You have trouble peeing because of: A prostate that is bigger than normal, if you are female. A blockage in the part of your body that drains pee from the bladder. A kidney stone. A nerve condition that affects your bladder. Not getting enough to drink. Not peeing often enough. You have other conditions, such as: Diabetes. A weak disease-fighting system (immune system). Sickle cell disease. Gout. Injury of the spine. What are the signs or symptoms? Symptoms of this condition include: Needing to pee right away. Peeing small amounts often. Pain or burning when peeing. Blood in the pee. Pee that smells bad or not like normal. Trouble peeing. Pee that is cloudy. Fluid coming from the vagina, if you are female. Pain in the belly or lower  back. Other symptoms include: Vomiting. Not feeling hungry. Feeling mixed up (confused). This may be the first symptom in older adults. Being tired and grouchy (irritable). A fever. Watery poop (diarrhea). How is this treated? Taking antibiotic medicine. Taking other medicines. Drinking enough water. In some cases, you may need to see a specialist. Follow these instructions at home:  Medicines Take over-the-counter and prescription medicines only as told by your doctor. If you were prescribed an antibiotic medicine, take it as told by your doctor. Do not stop taking it even if you start to feel better. General instructions Make sure you: Pee until your bladder is empty. Do not hold pee for a long time. Empty your bladder after sex. Wipe from front to back after peeing or pooping if you are a female. Use each tissue one time when you wipe. Drink enough fluid to keep your pee pale yellow. Keep all follow-up visits. Contact a doctor if: You do not get better after 1-2  days. Your symptoms go away and then come back. Get help right away if: You have very bad back pain. You have very bad pain in your lower belly. You have a fever. You have chills. You feeling like you will vomit or you vomit. Summary A urinary tract infection (UTI) is an infection of any part of the urinary tract. This condition is caused by germs in your genital area. There are many risk factors for a UTI. Treatment includes antibiotic medicines. Drink enough fluid to keep your pee pale yellow. This information is not intended to replace advice given to you by your health care provider. Make sure you discuss any questions you have with your healthcare provider.  Document Revised: 03/30/2020 Document Reviewed: 03/30/2020 Elsevier Patient Education  2022 [employer].

## 2021-03-07 NOTE — Progress Notes (Signed)
Acute Office Visit  Subjective:    Patient ID: [name], female    DOB: [birth year], 87 y.o.   MRN: [id number]  Chief Complaint  Patient presents with   Dysuria    HPI [name] is a 87 year old that is in today for dysuria, hesitancy, and decreased urinary output. She denies fever, chills, or back pain. Onset of symptoms was 1 days ago. Treatment has included Tylenol. She has not been pushing fluids. She bathes with [place] soap. She has experienced an increase in falls in her garden and her kitchen approximately 3-4 weeks ago. She has private caregivers that stay with [name] nightly. Pt has a Runner, broadcasting/film/video alert necklace to notify EMS of an emergency. One of her caregivers is present for this visit, helps supplement healthy history.  Urinary symptoms  She reports new onset dysuria. The current episode started a few days ago and is gradually worsening. Patient states symptoms are 5/10 in intensity, occurring intermittently. She  has not been recently treated for similar symptoms.    Associated symptoms: No abdominal pain No back pain  No chills No constipation  No cramping No diarrhea  No discharge No fever  No hematuria No nausea  No vomiting      Past Medical History:  Diagnosis Date   Acute ischemic stroke ([place]) 11/08/2018   Adjustment disorder with depressed mood 10/16/2019   Alteration consciousness 03/08/2020   Apraxia following cerebrovascular accident 10/16/2019   B12 deficiency 10/16/2019   Body mass index (BMI) less than 19 02/16/2020   Cerebrovascular accident (CVA) ([place]) 03/08/2020   Chronic kidney disease, stage II (mild) 12/06/2019   Closed fracture of one rib of right side with routine healing 06/30/2017   Confusion    COPD (chronic obstructive pulmonary disease) ([place]) 11/16/2017   Current use of long term anticoagulation 07/21/2017   Dementia, multiinfarct, with behavioral disturbance ([place]) 01/18/2020   Depression    Essential hypertension, benign 10/16/2019   First degree  atrioventricular block 06/26/2017   Hammer toe 06/26/2017   Hearing loss    Hyperlipidemia 06/26/2017   Malnutrition of moderate degree ([place]) 12/06/2019   Memory loss    Other specified cardiac arrhythmias 06/26/2017   Pain in limb 06/26/2017   Palpitations 06/26/2017   Paroxysmal atrial fibrillation ([place]) 06/26/2017   Formatting of this note might be different from the original. Overview:  remote history post operatively, CHADS 2 score=2 Formatting of this note might be different from the original. 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 10/16/2019   Stroke [facility])    Thoracic aorta atherosclerosis ([place]) 05/22/2020   Traumatic pneumothorax 06/30/2017    Past Surgical History:  Procedure Laterality Date   ABDOMINAL HYSTERECTOMY     SHOULDER SURGERY     TONSILLECTOMY      Family History  Problem Relation Age of Onset   Bone cancer Mother    Breast cancer Father    Diabetes Father    Heart disease Father    Breast cancer Sister    Dementia Brother    Alzheimer's disease Sister     Social History   Socioeconomic History   Marital status: Widowed    Spouse name: Not on file   Number of children: 2   Years of education: 12   Highest education level: High school graduate  Occupational History  Occupation: Retired  Tobacco Use   Smoking status: Never   Smokeless tobacco: Never  Vaping Use   Vaping Use: Never used  Substance and Sexual Activity   Alcohol use: No   Drug use: No   Sexual activity: Not Currently  Other Topics Concern   Not on file  Social History Narrative   Mrs. Facey is widowed. Her daughter, [name], is her POA and lives 12 miles away. She resides in her own home, alone for last 7 years. Since her CVA she has had someone with her most of the  time. She has a hired caregiver or a family member stays most of the day. She feel safe in her home and in her neighborhood.   Right-handed.   0.5 cups caffeine per day.   Social Determinants of Health   Financial Resource Strain: Not on file  Food Insecurity: Not on file  Transportation Needs: Not on file  Physical Activity: Not on file  Stress: Not on file  Social Connections: Not on file  Intimate Partner Violence: Not on file    Outpatient Medications Prior to Visit  Medication Sig Dispense Refill   acebutolol (SECTRAL) 200 MG capsule TAKE 1 CAPSULE(200 MG) BY MOUTH DAILY 90 capsule 3   apixaban (ELIQUIS) 2.5 MG TABS tablet TAKE 1 TABLET(2.5 MG) BY MOUTH TWICE DAILY 180 tablet 3   Apoaequorin (PREVAGEN PO) Take 1 tablet by mouth daily.     atorvastatin (LIPITOR) 40 MG tablet TAKE 1 TABLET(40 MG) BY MOUTH DAILY 90 tablet 2   Cholecalciferol (VITAMIN D3) 1000 units CAPS Take 1,000 Units by mouth daily.     divalproex (DEPAKOTE ER) 500 MG 24 hr tablet Take 1 tablet (500 mg total) by mouth at bedtime. 30 tablet 11   mirtazapine (REMERON) 30 MG tablet Take 1 tablet (30 mg total) by mouth at bedtime. 90 tablet 1   Multiple Vitamins-Minerals (PRESERVISION AREDS 2) CAPS Take by mouth.     OVER THE COUNTER MEDICATION Take 1 tablet by mouth daily. Selenex GSH supplement     prednisoLONE acetate (PRED FORTE) 1 % ophthalmic suspension SMARTSIG:1 Drop(s) Right Eye Twice Daily PRN     silver sulfADIAZINE (SILVADENE) 1 % cream Apply 1 application topically daily. 50 g 0   UNABLE TO FIND Med Name: [place] Take one tablet one day.     vitamin B-12 (CYANOCOBALAMIN) 500 MCG tablet Take 500 mcg by mouth daily.      cephALEXin (KEFLEX) 500 MG capsule Take 1 capsule (500 mg total) by mouth 4 (four) times daily. 20 capsule 1   No facility-administered medications prior to visit.    Allergies  Allergen Reactions   Codeine Nausea And Vomiting   Penicillins Rash   Sulfa Antibiotics Nausea And Vomiting     Review of Systems  Constitutional:  Negative for appetite change, fatigue and fever.  HENT:  Positive for hearing loss. Negative for congestion, ear pain, sinus pressure and sore throat.   Eyes:  Negative for pain.  Respiratory:  Negative for cough, chest tightness, shortness of breath and wheezing.   Cardiovascular:  Negative for chest pain and palpitations.  Gastrointestinal:  Positive for constipation. Negative for abdominal pain, diarrhea, nausea and vomiting.  Genitourinary:  Positive for decreased urine volume and dysuria. Negative for hematuria.  Musculoskeletal:  Positive for gait problem (increased falls). Negative for arthralgias, back pain, joint swelling and myalgias.  Skin:  Negative for rash.  Neurological:  Negative for dizziness and headaches.  Psychiatric/Behavioral:  Positive for agitation. Negative for dysphoric mood. The patient is not nervous/anxious.       Objective:    Physical Exam Vitals reviewed.  Constitutional:      Appearance: Normal appearance.  HENT:     Mouth/Throat:     Mouth: Mucous membranes are moist.  Cardiovascular:     Rate and Rhythm: Normal rate and regular rhythm.     Pulses: Normal pulses.     Heart sounds: Normal heart sounds.  Pulmonary:     Effort: Pulmonary effort is normal.     Breath sounds: Normal breath sounds.  Abdominal:     General: Bowel sounds are normal.     Palpations: Abdomen is soft.     Tenderness: There is left CVA tenderness. There is no right CVA tenderness.  Skin:    General: Skin is warm and dry.     Capillary Refill: Capillary refill takes less than 2 seconds.  Neurological:     Mental Status: She is alert and oriented to person, place, and time. Mental status is at baseline.  Psychiatric:        Mood and Affect: Mood normal.        Behavior: Behavior normal.    BP 122/62 (BP Location: Left Arm, Patient Position: Sitting)   Pulse 72   Temp 97.6 F (36.4 C) (Temporal)   Ht 5' 4"  (1.626 m)   Wt 104  lb (47.2 kg)   LMP  (LMP Unknown)   SpO2 98%   BMI 17.85 kg/m  Wt Readings from Last 3 Encounters:  03/07/21 104 lb (47.2 kg)  02/14/21 104 lb (47.2 kg)  01/22/21 105 lb (47.6 kg)    Health Maintenance Due  Topic Date Due   Zoster Vaccines- Shingrix (1 of 2) Never done   DEXA SCAN  Never done   PNA vac Low Risk Adult (2 of 2 - PCV13) 08/31/2019   COVID-19 Vaccine (3 - Pfizer risk series) 10/31/2019     Lab Results  Component Value Date   TSH 1.460 05/22/2020   Lab Results  Component Value Date   WBC 5.4 11/13/2020   HGB 12.1 11/13/2020   HCT 35.6 11/13/2020   MCV 98 (H) 11/13/2020   PLT 225 11/13/2020   Lab Results  Component Value Date   NA 143 11/13/2020   K 4.6 11/13/2020   CO2 24 11/13/2020   GLUCOSE 92 11/13/2020   BUN 20 11/13/2020   CREATININE 0.70 11/13/2020   BILITOT 0.4 09/25/2020   ALKPHOS 82 09/25/2020   AST 26 09/25/2020   ALT 21 09/25/2020   PROT 6.7 09/25/2020   ALBUMIN 4.2 09/25/2020   CALCIUM 9.3 11/13/2020   ANIONGAP 6 11/09/2018   EGFR 81 11/13/2020   Lab Results  Component Value Date   CHOL 142 09/25/2020   Lab Results  Component Value Date   HDL 45 09/25/2020   Lab Results  Component Value Date   LDLCALC 79 09/25/2020   Lab Results  Component Value Date   TRIG 93 09/25/2020   Lab Results  Component Value Date   CHOLHDL 3.2 09/25/2020   Lab Results  Component Value Date   HGBA1C 5.3 11/09/2018         Assessment & Plan:   1. Acute cystitis with hematuria - Urine Culture - levofloxacin (LEVAQUIN) 250 MG tablet; Take 1 tablet (250 mg total) by mouth daily.  Dispense: 3 tablet; Refill: 0  2. Dysuria - POCT urinalysis dipstick  3. Frequent falls -  fall precautions  4. Age-related physical debility  -fall precautions    Push fluids, especially water Change soap from [place] to [place] Take Levaquin 250 mg daily for 3 days Notify office if symptoms fail to improve or worsen Fall precautions at home Change  positions slowly Follow-up as needed  Follow-up: PRN   I,Lauren M Auman,acting as a Education administrator for [employer], NP.,have documented all relevant documentation on the behalf of Rip Harbour, NP,as directed by  Rip Harbour, NP while in the presence of Rip Harbour, NP.   I, Rip Harbour, NP, have reviewed all documentation for this visit. The documentation on 03/07/21 for the exam, diagnosis, procedures, and orders are all accurate and complete.   Signed, Jerrell Belfast, DNP

## 2021-03-10 LAB — URINE CULTURE

## 2021-03-14 ENCOUNTER — Encounter: Payer: Self-pay | Admitting: Physician Assistant

## 2021-03-14 ENCOUNTER — Encounter: Payer: Self-pay | Admitting: Legal Medicine

## 2021-03-18 ENCOUNTER — Ambulatory Visit (INDEPENDENT_AMBULATORY_CARE_PROVIDER_SITE_OTHER): Payer: PPO | Admitting: Legal Medicine

## 2021-03-18 ENCOUNTER — Other Ambulatory Visit: Payer: Self-pay

## 2021-03-18 ENCOUNTER — Encounter: Payer: Self-pay | Admitting: Legal Medicine

## 2021-03-18 VITALS — BP 140/76 | HR 74 | Temp 97.2°F | Resp 16 | Ht 64.0 in | Wt 104.0 lb

## 2021-03-18 DIAGNOSIS — H918X3 Other specified hearing loss, bilateral: Secondary | ICD-10-CM | POA: Diagnosis not present

## 2021-03-18 DIAGNOSIS — I480 Paroxysmal atrial fibrillation: Secondary | ICD-10-CM | POA: Diagnosis not present

## 2021-03-18 DIAGNOSIS — I700 Atherosclerosis of aorta: Secondary | ICD-10-CM | POA: Diagnosis not present

## 2021-03-18 DIAGNOSIS — Z681 Body mass index (BMI) 19 or less, adult: Secondary | ICD-10-CM

## 2021-03-18 DIAGNOSIS — E440 Moderate protein-calorie malnutrition: Secondary | ICD-10-CM | POA: Diagnosis not present

## 2021-03-18 DIAGNOSIS — E782 Mixed hyperlipidemia: Secondary | ICD-10-CM | POA: Diagnosis not present

## 2021-03-18 DIAGNOSIS — F01518 Vascular dementia, unspecified severity, with other behavioral disturbance: Secondary | ICD-10-CM

## 2021-03-18 DIAGNOSIS — I10 Essential (primary) hypertension: Secondary | ICD-10-CM | POA: Diagnosis not present

## 2021-03-18 DIAGNOSIS — F0151 Vascular dementia with behavioral disturbance: Secondary | ICD-10-CM | POA: Diagnosis not present

## 2021-03-18 DIAGNOSIS — J449 Chronic obstructive pulmonary disease, unspecified: Secondary | ICD-10-CM | POA: Diagnosis not present

## 2021-03-18 DIAGNOSIS — Z7901 Long term (current) use of anticoagulants: Secondary | ICD-10-CM | POA: Diagnosis not present

## 2021-03-18 DIAGNOSIS — R300 Dysuria: Secondary | ICD-10-CM | POA: Diagnosis not present

## 2021-03-18 NOTE — Progress Notes (Signed)
Established Patient Office Visit  Subjective:  Patient ID: [name], female    DOB: 1925-11-04  Age: 87 y.o. MRN: [id number]  CC:  Chief Complaint  Patient presents with   Atrial Fibrillation   Hypertension   Hyperlipidemia    HPI [name] presents for  chronic visit  Patient has a diagnosis of permanent atrial fibrillation.   Patient is on eliquis and has controlled ventricular response.  Patient is CV stable  Patient presents for follow up of hypertension.  Patient tolerating aebutolol well with side effects.  Patient was diagnosed with hypertension 2010 so has been treated for hypertension for 2010 years.Patient is working on maintaining diet and exercise regimen and follows up as directed. Complication include stroke  Patient presents with hyperlipidemia.  Compliance with treatment has been good; patient takes medicines as directed, maintains low cholesterol diet, follows up as directed, and maintains exercise regimen.  Patient is using atorvastatin without problems. .   Dementia to see [name] neuro, she fell and decreased hearing.  She has hearing aids.  Memory poor, sleep improved.  .     Past Medical History:  Diagnosis Date   Acute ischemic stroke ([place]) 11/08/2018   Adjustment disorder with depressed mood 10/16/2019   Alteration consciousness 03/08/2020   Apraxia following cerebrovascular accident 10/16/2019   B12 deficiency 10/16/2019   Body mass index (BMI) less than 19 02/16/2020   Cerebrovascular accident (CVA) ([place]) 03/08/2020   Chronic kidney disease, stage II (mild) 12/06/2019   Closed fracture of one rib of right side with routine healing 06/30/2017   Confusion    COPD (chronic obstructive pulmonary disease) ([place]) 11/16/2017   Current use of long term anticoagulation 07/21/2017   Dementia, multiinfarct, with behavioral disturbance ([place]) 01/18/2020   Depression    Essential hypertension, benign 10/16/2019   First degree atrioventricular block 06/26/2017    Hammer toe 06/26/2017   Hearing loss    Hyperlipidemia 06/26/2017   Malnutrition of moderate degree ([place]) 12/06/2019   Memory loss    Other specified cardiac arrhythmias 06/26/2017   Pain in limb 06/26/2017   Palpitations 06/26/2017   Paroxysmal atrial fibrillation ([place]) 06/26/2017   Formatting of this note might be different from the original. Overview:  remote history post operatively, CHADS 2 score=2 Formatting of this note might be different from the original. 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 10/16/2019   Stroke [facility])    Thoracic aorta atherosclerosis ([place]) 05/22/2020   Traumatic pneumothorax 06/30/2017    Past Surgical History:  Procedure Laterality Date   ABDOMINAL HYSTERECTOMY     SHOULDER SURGERY     TONSILLECTOMY      Family History  Problem Relation Age of Onset   Bone cancer Mother    Breast cancer Father    Diabetes Father    Heart disease Father    Breast cancer Sister    Dementia Brother    Alzheimer's disease Sister     Social History   Socioeconomic History   Marital status: Widowed    Spouse name: Not on file   Number of children: 2   Years of education: 12   Highest education level: High school graduate  Occupational History   Occupation: Retired  Tobacco Use   Smoking status: Never   Smokeless tobacco: Never  Media planner  Vaping Use: Never used  Substance and Sexual Activity   Alcohol use: No   Drug use: No   Sexual activity: Not Currently  Other Topics Concern   Not on file  Social History Narrative   [name] is widowed. Her daughter, [name], is her POA and lives 12 miles away. She resides in her own home, alone for last 7 years. Since her CVA she has had someone with her most of the time. She has a hired caregiver or  a family member stays most of the day. She feel safe in her home and in her neighborhood.   Right-handed.   0.5 cups caffeine per day.   Social Determinants of Health   Financial Resource Strain: Not on file  Food Insecurity: Not on file  Transportation Needs: Not on file  Physical Activity: Not on file  Stress: Not on file  Social Connections: Not on file  Intimate Partner Violence: Not on file    Outpatient Medications Prior to Visit  Medication Sig Dispense Refill   acebutolol (SECTRAL) 200 MG capsule TAKE 1 CAPSULE(200 MG) BY MOUTH DAILY 90 capsule 3   apixaban (ELIQUIS) 2.5 MG TABS tablet TAKE 1 TABLET(2.5 MG) BY MOUTH TWICE DAILY 180 tablet 3   Apoaequorin (PREVAGEN PO) Take 1 tablet by mouth daily.     atorvastatin (LIPITOR) 40 MG tablet TAKE 1 TABLET(40 MG) BY MOUTH DAILY 90 tablet 2   Cholecalciferol (VITAMIN D3) 1000 units CAPS Take 1,000 Units by mouth daily.     divalproex (DEPAKOTE ER) 500 MG 24 hr tablet Take 1 tablet (500 mg total) by mouth at bedtime. 30 tablet 11   mirtazapine (REMERON) 30 MG tablet Take 1 tablet (30 mg total) by mouth at bedtime. 90 tablet 1   Multiple Vitamins-Minerals (PRESERVISION AREDS 2) CAPS Take by mouth.     OVER THE COUNTER MEDICATION Take 1 tablet by mouth daily. Selenex GSH supplement     prednisoLONE acetate (PRED FORTE) 1 % ophthalmic suspension SMARTSIG:1 Drop(s) Right Eye Twice Daily PRN     UNABLE TO FIND Med Name: [place] Take one tablet one day.     vitamin B-12 (CYANOCOBALAMIN) 500 MCG tablet Take 500 mcg by mouth daily.      levofloxacin (LEVAQUIN) 250 MG tablet Take 1 tablet (250 mg total) by mouth daily. 3 tablet 0   silver sulfADIAZINE (SILVADENE) 1 % cream Apply 1 application topically daily. 50 g 0   No facility-administered medications prior to visit.    Allergies  Allergen Reactions   Codeine Nausea And Vomiting   Penicillins Rash   Sulfa Antibiotics Nausea And Vomiting    ROS Review of Systems  Constitutional:   Negative for activity change and appetite change.  HENT:  Negative for congestion and facial swelling.   Eyes:  Negative for visual disturbance.  Respiratory:  Negative for chest tightness and shortness of breath.   Gastrointestinal:  Negative for abdominal distention and abdominal pain.  Genitourinary:  Negative for difficulty urinating and dyspareunia.  Musculoskeletal:  Negative for arthralgias and back pain.  Skin: Negative.   Neurological:  Negative for dizziness and light-headedness.  Psychiatric/Behavioral:  Positive for decreased concentration. Negative for agitation and behavioral problems.      Objective:    Physical Exam Vitals reviewed.  Constitutional:      General: She is not in acute distress.    Appearance: Normal appearance.  HENT:     Head: Normocephalic.     Right Ear: Tympanic membrane,  ear canal and external ear normal.     Left Ear: Tympanic membrane, ear canal and external ear normal.     Mouth/Throat:     Mouth: Mucous membranes are moist.     Pharynx: Oropharynx is clear.  Eyes:     Extraocular Movements: Extraocular movements intact.     Conjunctiva/sclera: Conjunctivae normal.     Pupils: Pupils are equal, round, and reactive to light.  Cardiovascular:     Rate and Rhythm: Normal rate and regular rhythm.     Pulses: Normal pulses.     Heart sounds: Normal heart sounds. No murmur heard.   No gallop.  Pulmonary:     Effort: Pulmonary effort is normal. No respiratory distress.     Breath sounds: Normal breath sounds. No wheezing.  Abdominal:     General: Abdomen is flat. Bowel sounds are normal. There is no distension.     Palpations: Abdomen is soft.     Tenderness: There is no abdominal tenderness.  Musculoskeletal:     Cervical back: Normal range of motion and neck supple.     Comments: MUSCLE WAISTING  Skin:    General: Skin is warm.     Capillary Refill: Capillary refill takes less than 2 seconds.  Neurological:     General: No focal  deficit present.     Mental Status: She is alert. She is disoriented.    BP 140/76   Pulse 74   Temp (!) 97.2 F (36.2 C)   Resp 16   Ht _0  (1.626 m)   Wt 104 lb (47.2 kg)   LMP  (LMP Unknown)   BMI 17.85 kg/m  Wt Readings from Last 3 Encounters:  03/18/21 104 lb (47.2 kg)  03/07/21 104 lb (47.2 kg)  02/14/21 104 lb (47.2 kg)     Health Maintenance Due  Topic Date Due   Zoster Vaccines- Shingrix (1 of 2) Never done   DEXA SCAN  Never done   PNA vac Low Risk Adult (2 of 2 - PCV13) 08/31/2019   COVID-19 Vaccine (3 - Pfizer risk series) 10/31/2019    There are no preventive care reminders to display for this patient.  Lab Results  Component Value Date   TSH 1.460 05/22/2020   Lab Results  Component Value Date   WBC 5.4 11/13/2020   HGB 12.1 11/13/2020   HCT 35.6 11/13/2020   MCV 98 (H) 11/13/2020   PLT 225 11/13/2020   Lab Results  Component Value Date   NA 143 11/13/2020   K 4.6 11/13/2020   CO2 24 11/13/2020   GLUCOSE 92 11/13/2020   BUN 20 11/13/2020   CREATININE 0.70 11/13/2020   BILITOT 0.4 09/25/2020   ALKPHOS 82 09/25/2020   AST 26 09/25/2020   ALT 21 09/25/2020   PROT 6.7 09/25/2020   ALBUMIN 4.2 09/25/2020   CALCIUM 9.3 11/13/2020   ANIONGAP 6 11/09/2018   EGFR 81 11/13/2020   Lab Results  Component Value Date   CHOL 142 09/25/2020   Lab Results  Component Value Date   HDL 45 09/25/2020   Lab Results  Component Value Date   LDLCALC 79 09/25/2020   Lab Results  Component Value Date   TRIG 93 09/25/2020   Lab Results  Component Value Date   CHOLHDL 3.2 09/25/2020   Lab Results  Component Value Date   HGBA1C 5.3 11/09/2018      Assessment & Plan:  Diagnoses and all orders for this visit: Dysuria -  POCT URINALYSIS DIP ([facility]IN[place]) Urine normal  Mixed hyperlipidemia -     Lipid panel AN INDIVIDUAL CARE PLAN for hyperlipidemia/ cholesterol was established and reinforced today.  The patient's status was assessed using  clinical findings on exam, lab and other diagnostic tests. The patient's disease status was assessed based on evidence-based guidelines and found to be fair controlled. MEDICATIONS were reviewed. SELF MANAGEMENT GOALS have been discussed and patient's success at attaining the goal of low cholesterol was assessed. RECOMMENDATION given include regular exercise 3 days a week and low cholesterol/low fat diet. CLINICAL SUMMARY including written plan to identify barriers unique to the patient due to social or economic  reasons was discussed.   Benign essential hypertension -     CBC with Differential/Platelet -     Comprehensive metabolic panel An individual hypertension care plan was established and reinforced today.  The patient's status was assessed using clinical findings on exam and labs or diagnostic tests. The patient's success at meeting treatment goals on disease specific evidence-based guidelines and found to be fair controlled. SELF MANAGEMENT: The patient and I together assessed ways to personally work towards obtaining the recommended goals. RECOMMENDATIONS: avoid decongestants found in common cold remedies, decrease consumption of alcohol, perform routine monitoring of BP with home BP cuff, exercise, reduction of dietary salt, take medicines as prescribed, try not to miss doses and quit smoking.  Regular exercise and maintaining a healthy weight is needed.  Stress reduction may help. A CLINICAL SUMMARY including written plan identify barriers to care unique to individual due to social or financial issues.  We attempt to mutually creat solutions for individual and family understanding.   Other specified hearing loss of both ears Patient needs hearing aids  Paroxysmal atrial fibrillation [facility]) Patient has a diagnosis of permanent atrial fibrillation.   Patient is on eliquis and has controlled ventricular response.  Patient is CV stale .  Chronic obstructive pulmonary disease, unspecified  COPD type ([place]) An individualize plan was formulated for care of COPD.  Treatment is evidence based.  She will continue on inhalers, avoid smoking and smoke.  Regular exercise with help with dyspnea. Routine follow ups and medication compliance is needed.   Dementia, multiinfarct, with behavioral disturbance ([place]) Patient has dementia to see [place] neurology for neuropsyche evaluation  Essential hypertension, benign An individual hypertension care plan was established and reinforced today.  The patient's status was assessed using clinical findings on exam and labs or diagnostic tests. The patient's success at meeting treatment goals on disease specific evidence-based guidelines and found to be well controlled. SELF MANAGEMENT: The patient and I together assessed ways to personally work towards obtaining the recommended goals. RECOMMENDATIONS: avoid decongestants found in common cold remedies, decrease consumption of alcohol, perform routine monitoring of BP with home BP cuff, exercise, reduction of dietary salt, take medicines as prescribed, try not to miss doses and quit smoking.  Regular exercise and maintaining a healthy weight is needed.  Stress reduction may help. A CLINICAL SUMMARY including written plan identify barriers to care unique to individual due to social or financial issues.  We attempt to mutually creat solutions for individual and family understanding.   Thoracic aorta atherosclerosis ([place]) Thoracic atherosclerosis followed by cardiology  Current use of long term anticoagulation Patient on long term eliquis  Malnutrition of moderate degree ([place]) Supplement nutrition with protein/calorie supplement with meals to improve nutritional status.   Body mass index (BMI) less than 19 Low BMI with malnutrition, she refuses nutritional supplements  Follow-up: Return in about 6 months (around 09/18/2021) for fasting.    Reinaldo Meeker, MD

## 2021-03-19 LAB — COMPREHENSIVE METABOLIC PANEL
ALT: 15 IU/L (ref 0–32)
AST: 24 IU/L (ref 0–40)
Albumin/Globulin Ratio: 1.6 (ref 1.2–2.2)
Albumin: 3.9 g/dL (ref 3.5–4.6)
Alkaline Phosphatase: 61 IU/L (ref 44–121)
BUN/Creatinine Ratio: 25 (ref 12–28)
BUN: 19 mg/dL (ref 10–36)
Bilirubin Total: 0.4 mg/dL (ref 0.0–1.2)
CO2: 26 mmol/L (ref 20–29)
Calcium: 8.6 mg/dL — ABNORMAL LOW (ref 8.7–10.3)
Chloride: 104 mmol/L (ref 96–106)
Creatinine, Ser: 0.76 mg/dL (ref 0.57–1.00)
Globulin, Total: 2.5 g/dL (ref 1.5–4.5)
Glucose: 81 mg/dL (ref 65–99)
Potassium: 5.2 mmol/L (ref 3.5–5.2)
Sodium: 143 mmol/L (ref 134–144)
Total Protein: 6.4 g/dL (ref 6.0–8.5)
eGFR: 73 mL/min/{1.73_m2} (ref >59)

## 2021-03-19 LAB — CBC WITH DIFFERENTIAL/PLATELET
Basophils Absolute: 0 10*3/uL (ref 0.0–0.2)
Basos: 1 %
EOS (ABSOLUTE): 0.1 10*3/uL (ref 0.0–0.4)
Eos: 2 %
Hematocrit: 35.5 % (ref 34.0–46.6)
Hemoglobin: 11.7 g/dL (ref 11.1–15.9)
Immature Grans (Abs): 0 10*3/uL (ref 0.0–0.1)
Immature Granulocytes: 1 %
Lymphocytes Absolute: 2.2 10*3/uL (ref 0.7–3.1)
Lymphs: 37 %
MCH: 32.6 pg (ref 26.6–33.0)
MCHC: 33 g/dL (ref 31.5–35.7)
MCV: 99 fL — ABNORMAL HIGH (ref 79–97)
Monocytes Absolute: 0.8 10*3/uL (ref 0.1–0.9)
Monocytes: 13 %
Neutrophils Absolute: 2.8 10*3/uL (ref 1.4–7.0)
Neutrophils: 46 %
Platelets: 203 10*3/uL (ref 150–450)
RBC: 3.59 x10E6/uL — ABNORMAL LOW (ref 3.77–5.28)
RDW: 12.9 % (ref 11.7–15.4)
WBC: 5.9 10*3/uL (ref 3.4–10.8)

## 2021-03-19 LAB — CARDIOVASCULAR RISK ASSESSMENT

## 2021-03-19 LAB — LIPID PANEL
Chol/HDL Ratio: 2.4 ratio (ref 0.0–4.4)
Cholesterol, Total: 112 mg/dL (ref 100–199)
HDL: 46 mg/dL (ref >39)
LDL Chol Calc (NIH): 50 mg/dL (ref 0–99)
Triglycerides: 78 mg/dL (ref 0–149)
VLDL Cholesterol Cal: 16 mg/dL (ref 5–40)

## 2021-03-19 NOTE — Progress Notes (Signed)
Cbc normal, kidney and liver tests normal, calcium still high, cholesterol tests normal,  lp

## 2021-03-23 ENCOUNTER — Encounter: Payer: Self-pay | Admitting: Legal Medicine

## 2021-03-25 ENCOUNTER — Ambulatory Visit: Payer: PPO | Admitting: Legal Medicine

## 2021-03-28 ENCOUNTER — Other Ambulatory Visit: Payer: Self-pay

## 2021-03-28 ENCOUNTER — Ambulatory Visit (INDEPENDENT_AMBULATORY_CARE_PROVIDER_SITE_OTHER): Payer: PPO | Admitting: Legal Medicine

## 2021-03-28 ENCOUNTER — Encounter: Payer: Self-pay | Admitting: Legal Medicine

## 2021-03-28 VITALS — BP 110/60 | HR 72 | Temp 97.3°F | Ht 64.0 in | Wt 103.0 lb

## 2021-03-28 DIAGNOSIS — T7840XA Allergy, unspecified, initial encounter: Secondary | ICD-10-CM | POA: Diagnosis not present

## 2021-03-28 MED ORDER — TRIAMCINOLONE ACETONIDE 40 MG/ML IJ SUSP
60.0000 mg | Freq: Once | INTRAMUSCULAR | Status: AC
  Administered 2021-03-28: 60 mg via INTRAMUSCULAR

## 2021-03-28 NOTE — Progress Notes (Signed)
Established Patient Office Visit  Subjective:  Patient ID: [name], female    DOB: 02-17-1926  Age: 87 y.o. MRN: [id number]  CC:  Chief Complaint  Patient presents with   Insect Bite    Bee sting, swelling, patient is uncomfortable     HPI [name] presents for rash for 2 days.  She had bites on wrist and lower legs from flower bed. Using benedryl  Past Medical History:  Diagnosis Date   Acute ischemic stroke ([place]) 11/08/2018   Adjustment disorder with depressed mood 10/16/2019   Alteration consciousness 03/08/2020   Apraxia following cerebrovascular accident 10/16/2019   B12 deficiency 10/16/2019   Body mass index (BMI) less than 19 02/16/2020   Cerebrovascular accident (CVA) ([place]) 03/08/2020   Chronic kidney disease, stage II (mild) 12/06/2019   Closed fracture of one rib of right side with routine healing 06/30/2017   Confusion    COPD (chronic obstructive pulmonary disease) ([place]) 11/16/2017   Current use of long term anticoagulation 07/21/2017   Dementia, multiinfarct, with behavioral disturbance ([place]) 01/18/2020   Depression    Essential hypertension, benign 10/16/2019   First degree atrioventricular block 06/26/2017   Hammer toe 06/26/2017   Hearing loss    Hyperlipidemia 06/26/2017   Malnutrition of moderate degree ([place]) 12/06/2019   Memory loss    Other specified cardiac arrhythmias 06/26/2017   Pain in limb 06/26/2017   Palpitations 06/26/2017   Paroxysmal atrial fibrillation ([place]) 06/26/2017   Formatting of this note might be different from the original. Overview:  remote history post operatively, CHADS 2 score=2 Formatting of this note might be different from the original. 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 10/16/2019   Stroke [facility])    Thoracic aorta  atherosclerosis ([place]) 05/22/2020   Traumatic pneumothorax 06/30/2017    Past Surgical History:  Procedure Laterality Date   ABDOMINAL HYSTERECTOMY     SHOULDER SURGERY     TONSILLECTOMY      Family History  Problem Relation Age of Onset   Bone cancer Mother    Breast cancer Father    Diabetes Father    Heart disease Father    Breast cancer Sister    Dementia Brother    Alzheimer's disease Sister     Social History   Socioeconomic History   Marital status: Widowed    Spouse name: Not on file   Number of children: 2   Years of education: 12   Highest education level: High school graduate  Occupational History   Occupation: Retired  Tobacco Use   Smoking status: Never   Smokeless tobacco: Never  Vaping Use   Vaping Use: Never used  Substance and Sexual Activity   Alcohol use: No   Drug use: No   Sexual activity: Not Currently  Other Topics Concern   Not on file  Social History Narrative   Mrs. Lowe is widowed. Her daughter, [name], is her POA and lives 12 miles away. She resides in her own home, alone for last 7 years. Since her CVA she has had someone with her most of the time. She has a hired caregiver or a family member stays most of the day. She feel safe in her home and in her neighborhood.   Right-handed.   0.5 cups caffeine per day.   Social Determinants  of Health   Financial Resource Strain: Not on file  Food Insecurity: Not on file  Transportation Needs: Not on file  Physical Activity: Not on file  Stress: Not on file  Social Connections: Not on file  Intimate Partner Violence: Not on file    Outpatient Medications Prior to Visit  Medication Sig Dispense Refill   acebutolol (SECTRAL) 200 MG capsule TAKE 1 CAPSULE(200 MG) BY MOUTH DAILY 90 capsule 3   apixaban (ELIQUIS) 2.5 MG TABS tablet TAKE 1 TABLET(2.5 MG) BY MOUTH TWICE DAILY 180 tablet 3   Apoaequorin (PREVAGEN PO) Take 1 tablet by mouth daily.     atorvastatin (LIPITOR) 40 MG tablet  TAKE 1 TABLET(40 MG) BY MOUTH DAILY 90 tablet 2   Cholecalciferol (VITAMIN D3) 1000 units CAPS Take 1,000 Units by mouth daily.     divalproex (DEPAKOTE ER) 500 MG 24 hr tablet Take 1 tablet (500 mg total) by mouth at bedtime. 30 tablet 11   mirtazapine (REMERON) 30 MG tablet Take 1 tablet (30 mg total) by mouth at bedtime. 90 tablet 1   Multiple Vitamins-Minerals (PRESERVISION AREDS 2) CAPS Take by mouth.     OVER THE COUNTER MEDICATION Take 1 tablet by mouth daily. Selenex GSH supplement     prednisoLONE acetate (PRED FORTE) 1 % ophthalmic suspension SMARTSIG:1 Drop(s) Right Eye Twice Daily PRN     UNABLE TO FIND Med Name: [place] Take one tablet one day.     vitamin B-12 (CYANOCOBALAMIN) 500 MCG tablet Take 500 mcg by mouth daily.      No facility-administered medications prior to visit.    Allergies  Allergen Reactions   Codeine Nausea And Vomiting   Penicillins Rash   Sulfa Antibiotics Nausea And Vomiting    ROS Review of Systems  Constitutional:  Negative for activity change and appetite change.  HENT:  Negative for congestion.   Eyes:  Negative for visual disturbance.  Respiratory:  Negative for chest tightness and shortness of breath.   Cardiovascular:  Negative for chest pain, palpitations and leg swelling.  Gastrointestinal:  Negative for abdominal distention and abdominal pain.  Endocrine: Negative for polyuria.  Genitourinary:  Negative for difficulty urinating and dysuria.  Musculoskeletal:  Negative for arthralgias and back pain.  Neurological: Negative.   Psychiatric/Behavioral: Negative.       Objective:    Physical Exam Vitals reviewed.  Constitutional:      Appearance: Normal appearance.  Cardiovascular:     Rate and Rhythm: Normal rate and regular rhythm.     Pulses: Normal pulses.     Heart sounds: No murmur heard.   No gallop.  Pulmonary:     Effort: Pulmonary effort is normal. No respiratory distress.     Breath sounds: Normal breath sounds. No  wheezing.  Musculoskeletal:        General: Normal range of motion.     Cervical back: Normal range of motion and neck supple.  Skin:    Capillary Refill: Capillary refill takes less than 2 seconds.     Comments: Swelling redness at bug bites  Neurological:     General: No focal deficit present.     Mental Status: She is alert and oriented to person, place, and time.    BP 110/60   Pulse 72   Temp (!) 97.3 F (36.3 C)   Ht 5' 4"  (1.626 m)   Wt 103 lb (46.7 kg)   LMP  (LMP Unknown)   SpO2 97%   BMI 17.68 kg/m  Wt Readings from Last 3 Encounters:  03/28/21 103 lb (46.7 kg)  03/18/21 104 lb (47.2 kg)  03/07/21 104 lb (47.2 kg)     Health Maintenance Due  Topic Date Due   Zoster Vaccines- Shingrix (1 of 2) Never done   DEXA SCAN  Never done   PNA vac Low Risk Adult (2 of 2 - PCV13) 08/31/2019   COVID-19 Vaccine (3 - Pfizer risk series) 10/31/2019    There are no preventive care reminders to display for this patient.  Lab Results  Component Value Date   TSH 1.460 05/22/2020   Lab Results  Component Value Date   WBC 5.9 03/18/2021   HGB 11.7 03/18/2021   HCT 35.5 03/18/2021   MCV 99 (H) 03/18/2021   PLT 203 03/18/2021   Lab Results  Component Value Date   NA 143 03/18/2021   K 5.2 03/18/2021   CO2 26 03/18/2021   GLUCOSE 81 03/18/2021   BUN 19 03/18/2021   CREATININE 0.76 03/18/2021   BILITOT 0.4 03/18/2021   ALKPHOS 61 03/18/2021   AST 24 03/18/2021   ALT 15 03/18/2021   PROT 6.4 03/18/2021   ALBUMIN 3.9 03/18/2021   CALCIUM 8.6 (L) 03/18/2021   ANIONGAP 6 11/09/2018   EGFR 73 03/18/2021   Lab Results  Component Value Date   CHOL 112 03/18/2021   Lab Results  Component Value Date   HDL 46 03/18/2021   Lab Results  Component Value Date   LDLCALC 50 03/18/2021   Lab Results  Component Value Date   TRIG 78 03/18/2021   Lab Results  Component Value Date   CHOLHDL 2.4 03/18/2021   Lab Results  Component Value Date   HGBA1C 5.3  11/09/2018      Assessment & Plan:   Problem List Items Addressed This Visit   None Visit Diagnoses     Allergic reaction, initial encounter    -  Primary   Relevant Medications   triamcinolone acetonide (KENALOG-40) injection 60 mg (Completed)  Patient has several stings on arms and feet, sue kenalog 80m IM for inflammation and use cool compresses for itching     Meds ordered this encounter  Medications   triamcinolone acetonide (KENALOG-40) injection 60 mg    Follow-up: Return if symptoms worsen or fail to improve.    LReinaldo Meeker MD

## 2021-04-02 DIAGNOSIS — H6123 Impacted cerumen, bilateral: Secondary | ICD-10-CM

## 2021-04-02 HISTORY — DX: Impacted cerumen, bilateral: H61.23

## 2021-04-08 ENCOUNTER — Other Ambulatory Visit: Payer: Self-pay

## 2021-04-08 ENCOUNTER — Ambulatory Visit (INDEPENDENT_AMBULATORY_CARE_PROVIDER_SITE_OTHER): Payer: PPO

## 2021-04-08 DIAGNOSIS — E782 Mixed hyperlipidemia: Secondary | ICD-10-CM

## 2021-04-08 DIAGNOSIS — I480 Paroxysmal atrial fibrillation: Secondary | ICD-10-CM

## 2021-04-08 NOTE — Progress Notes (Signed)
Chronic Care Management Pharmacy Note  04/13/2021 Name:  [name] MRN:  [id number] DOB:  [birth year]  Summary: Seeing memory specialist this week  - slower movements lately and not as clear.  Blood pressure - evening reading 91/63 mmHg with an evening companion. Checking periodically and reading 117/70s. 107/51mHg today.    Subjective: Katherine THEALLis an 87y.o. year old female who is a primary patient of PHenrene Pastor LZeb Comfort MD.  The CCM team was consulted for assistance with disease management and care coordination needs.    Engaged with patient by telephone for follow up visit in response to provider referral for pharmacy case management and/or care coordination services.   Consent to Services:  The patient was given the following information about Chronic Care Management services today, agreed to services, and gave verbal consent: 1. CCM service includes personalized support from designated clinical staff supervised by the primary care provider, including individualized plan of care and coordination with other care providers 2. 24/7 contact phone numbers for assistance for urgent and routine care needs. 3. Service will only be billed when office clinical staff spend 20 minutes or more in a month to coordinate care. 4. Only one practitioner may furnish and bill the service in a calendar month. 5.The patient may stop CCM services at any time (effective at the end of the month) by phone call to the office staff. 6. The patient will be responsible for cost sharing (co-pay) of up to 20% of the service fee (after annual deductible is met). Patient agreed to services and consent obtained.  Patient Care Team: PLillard Anes MD as PCP - General (Family Medicine) BBurnice Logan [facility] Pharmacist (Pharmacist) PElodia Florence, MD as Consulting Physician (Family Medicine) MRichardo Priest MD as Consulting Physician (Cardiology)  Recent office visits: 03/28/2021 -  kenalog injection for allergic reaction for bee sting.  03/18/2021 - Cbc normal, kidney and liver tests normal, calcium still high, cholesterol tests normal.  03/07/2021 - acute cystitis. Change to [employer] Levaquin 250 mg daily for three days.  02/14/2021 - keflex for cellulitis of right toe. Referral to podiatry.  01/22/2021 - silver sulfadiazine for cat scratch on left leg.  Recent consult visits: 04/02/2021 - ENT - cerumen removal.   Hospital visits: None in previous 6 months   Objective:  Lab Results  Component Value Date   CREATININE 0.76 03/18/2021   BUN 19 03/18/2021   GFRNONAA 66 09/25/2020   GFRAA 76 09/25/2020   NA 143 03/18/2021   K 5.2 03/18/2021   CALCIUM 8.6 (L) 03/18/2021   CO2 26 03/18/2021   GLUCOSE 81 03/18/2021    Lab Results  Component Value Date/Time   HGBA1C 5.3 11/09/2018 05:12 AM    Last diabetic Eye exam: No results found for: HMDIABEYEEXA  Last diabetic Foot exam: No results found for: HMDIABFOOTEX   Lab Results  Component Value Date   CHOL 112 03/18/2021   HDL 46 03/18/2021   LDLCALC 50 03/18/2021   TRIG 78 03/18/2021   CHOLHDL 2.4 03/18/2021    Hepatic Function Latest Ref Rng & Units 03/18/2021 09/25/2020 05/22/2020  Total Protein 6.0 - 8.5 g/dL 6.4 6.7 6.6  Albumin 3.5 - 4.6 g/dL 3.9 4.2 4.3  AST 0 - 40 IU/L _0 ALT 0 - 32 IU/L _1 Alk Phosphatase 44 - 121 IU/L 61 82 65  Total Bilirubin 0.0 - 1.2 mg/dL 0.4 0.4 0.7  Lab Results  Component Value Date/Time   TSH 1.460 05/22/2020 11:04 AM    CBC Latest Ref Rng & Units 03/18/2021 11/13/2020 09/25/2020  WBC 3.4 - 10.8 x10E3/uL 5.9 5.4 5.7  Hemoglobin 11.1 - 15.9 g/dL 11.7 12.1 12.3  Hematocrit 34.0 - 46.6 % 35.5 35.6 37.0  Platelets 150 - 450 x10E3/uL 203 225 235    No results found for: VD25OH  Clinical ASCVD: Yes  The ASCVD Risk score Mikey Bussing DC Jr., et al., 2013) failed to calculate for the following reasons:   The 2013 ASCVD risk score is only valid for ages 28 to  43   The patient has a prior MI or stroke diagnosis    Depression screen [facility] 2/9 03/28/2021 09/25/2020 05/22/2020  Decreased Interest 0 1 3  Down, Depressed, Hopeless 0 1 3  PHQ - 2 Score 0 2 6  Altered sleeping - 0 3  Tired, decreased energy - 2 3  Change in appetite - 0 3  Feeling bad or failure about yourself  - 0 3  Trouble concentrating - - 3  Moving slowly or fidgety/restless - 2 3  Suicidal thoughts - 0 1  PHQ-9 Score - 6 25  Difficult doing work/chores - Somewhat difficult Extremely dIfficult     CHA2DS2-VASc Score = 6  Social History   Tobacco Use  Smoking Status Never  Smokeless Tobacco Never   BP Readings from Last 3 Encounters:  04/10/21 127/80  03/28/21 110/60  03/18/21 140/76   Pulse Readings from Last 3 Encounters:  04/10/21 80  03/28/21 72  03/18/21 74   Wt Readings from Last 3 Encounters:  04/10/21 100 lb (45.4 kg)  03/28/21 103 lb (46.7 kg)  03/18/21 104 lb (47.2 kg)   BMI Readings from Last 3 Encounters:  04/10/21 17.71 kg/m  03/28/21 17.68 kg/m  03/18/21 17.85 kg/m    Assessment/Interventions: Review of patient past medical history, allergies, medications, health status, including review of consultants reports, laboratory and other test data, was performed as part of comprehensive evaluation and provision of chronic care management services.   SDOH:  (Social Determinants of Health) assessments and interventions performed: Yes  SDOH Screenings   Alcohol Screen: Not on file  Depression (PHQ2-9): Low Risk    PHQ-2 Score: 0  Financial Resource Strain: Not on file  Food Insecurity: Not on file  Housing: Not on file  Physical Activity: Not on file  Social Connections: Not on file  Stress: Not on file  Tobacco Use: Low Risk    Smoking Tobacco Use: Never   Smokeless Tobacco Use: Never  Transportation Needs: Not on file    [place]  Allergies  Allergen Reactions   Codeine Nausea And Vomiting   Penicillins Rash   Sulfa  Antibiotics Nausea And Vomiting    Medications Reviewed Today     Reviewed by Burnice Logan, [facility] (Pharmacist) on 04/13/21 at 1248  Med List Status: <None>   Medication Order Taking? Sig Documenting Provider Last Dose Status Informant  acebutolol (SECTRAL) 200 MG capsule [id number] Yes TAKE 1 CAPSULE(200 MG) BY MOUTH DAILY [name] Hilton Cork, MD Taking Active   apixaban (ELIQUIS) 2.5 MG TABS tablet [id number] Yes TAKE 1 TABLET(2.5 MG) BY MOUTH TWICE DAILY [name] Hilton Cork, MD Taking Active   Apoaequorin (PREVAGEN PO) [id number] Yes Take 1 tablet by mouth daily. [provider] Taking Active   atorvastatin (LIPITOR) 40 MG tablet [id number] Yes TAKE 1 TABLET(40 MG) BY MOUTH DAILY [name], MD Taking  Active   Cholecalciferol (VITAMIN D3) 1000 units CAPS [id number] Yes Take 1,000 Units by mouth daily. [provider] Taking Active Multiple Informants  divalproex (DEPAKOTE ER) 500 MG 24 hr tablet [id number] Yes Take 1 tablet (500 mg total) by mouth at bedtime. Marcial Pacas, MD Taking Active   docusate sodium (COLACE) 100 MG capsule [id number] Yes Take 100 mg by mouth daily. [provider] Taking Active   donepezil (ARICEPT) 10 MG tablet [id number]  Take half tablet (5 mg) daily for 2 weeks, then increase to the full tablet at 10 mg daily Rondel Jumbo, PA-C  Active   mirtazapine (REMERON) 30 MG tablet [id number] Yes Take 1 tablet (30 mg total) by mouth at bedtime. [name], MD Taking Active   Multiple Vitamins-Minerals (PRESERVISION AREDS 2) CAPS [id number] Yes Take by mouth. [provider] Taking Active   OVER THE [place] [id number] Yes Take 1 tablet by mouth daily. Selenex [place] supplement [provider] Taking Active Self  prednisoLONE acetate (PRED FORTE) 1 % ophthalmic suspension [id number] Yes SMARTSIG:1 Drop(s) Right Eye Twice Daily PRN  Patient not taking: Reported on 04/10/2021   [provider] Taking Active    vitamin B-12 (CYANOCOBALAMIN) 500 MCG tablet [id number] Yes Take 500 mcg by mouth daily.  [provider] Taking Active Multiple Informants            Patient Active Problem List   Diagnosis Date Noted   Memory loss    Gait abnormality 09/24/2020   Laceration of right lower leg 06/18/2020   Thoracic aorta atherosclerosis ([place]) 05/22/2020   Stroke ([place])    Hearing loss    Depression    Confusion    Alteration consciousness 03/08/2020   Cerebrovascular accident (CVA) ([place]) 03/08/2020   Body mass index (BMI) less than 19 02/16/2020   Dementia, multiinfarct, with behavioral disturbance ([place]) 01/18/2020   Chronic kidney disease, stage II (mild) 12/06/2019   Malnutrition of moderate degree ([place]) 12/06/2019   Essential hypertension, benign 10/16/2019   Adjustment disorder with depressed mood 10/16/2019   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 10/16/2019   Apraxia following cerebrovascular accident 10/16/2019   B12 deficiency 10/16/2019   Acute ischemic stroke ([place]) 11/08/2018   COPD (chronic obstructive pulmonary disease) ([place]) 11/16/2017   PSVT (paroxysmal supraventricular tachycardia) ([place]) 11/10/2017   Current use of long term anticoagulation 07/21/2017   Traumatic pneumothorax 06/30/2017   Closed fracture of one rib of right side with routine healing 06/30/2017   First degree atrioventricular block 06/26/2017   Hammer toe 06/26/2017   Hyperlipidemia 06/26/2017   Paroxysmal atrial fibrillation ([place]) 06/26/2017   Premature ventricular contractions 06/26/2017   Premature atrial complex 06/26/2017   Palpitations 06/26/2017   Pain in limb 06/26/2017   Other specified cardiac arrhythmias 06/26/2017    Immunization History  Administered Date(s) Administered   Fluad Quad(high Dose 65+) 05/22/2020   PFIZER(Purple Top)SARS-COV-2 Vaccination 09/13/2019, 10/03/2019   Pneumococcal Polysaccharide-23 08/30/2018   Tdap 12/20/2018    Conditions to be  addressed/monitored:  Hypertension and Hyperlipidemia  Care Plan : CCM Pharmacy Care Plan  Updates made by Burnice Logan, [place] since 04/13/2021 12:00 AM     Problem: hld, afib   Priority: High  Onset Date: 04/13/2021     Long-Range Goal: Disease State Management   Start Date: 04/13/2021  Expected End Date: 04/13/2022  This Visit's Progress: On track  Priority: High  Note:    Current Barriers:  Unable to self administer medications as prescribed  Pharmacist Clinical Goal(s):  Patient will achieve ability to self administer medications as prescribed through use of pill box, daughter and caregivers as evidenced by patient report through collaboration with PharmD and provider.   Interventions: 1:1 collaboration with [name], MD regarding development and update of comprehensive plan of care as evidenced by provider attestation and co-signature Inter-disciplinary care team collaboration (see longitudinal plan of care) Comprehensive medication review performed; medication list updated in electronic medical record  Atrial Fibrillation (Goal: prevent stroke and major bleeding) -Controlled -CHADSVASC: 6 -Current treatment: Rate control: acebutolol 200 mg daily  Anticoagulation: eliquis 2.5 mg bid  -Medications previously tried: none reported -Home BP and HR readings: 117/70s. 107/70 mmHg -Counseled on increased risk of stroke due to Afib and benefits of anticoagulation for stroke prevention; bleeding risk associated with Eliquis and importance of self-monitoring for signs/symptoms of bleeding; -Recommended to continue current medication   Hyperlipidemia: (LDL goal < 55) -Controlled -Current treatment: atorvastatin 40 mg daily  -Medications previously tried: none reported  -Current dietary patterns: limited appetite -Current exercise habits: moving less lately -Educated on Cholesterol goals;  -Recommended to continue current medication  Patient Goals/Self-Care  Activities Patient will:  - take medications as prescribed focus on medication adherence by using pill box and caregiver  Follow Up Plan: Telephone follow up appointment with care management team member scheduled for: 10/2021       Medication Assistance: None required.  Patient affirms current coverage meets needs.  Compliance/Adherence/Medication fill history: Care Gaps: AWV needed for 2022  Star-Rating Drugs: Atorvastatin - 02/04/2021 - 90 day supply   Patient's preferred pharmacy is:  [facility] [place], [place] - [address] AT [address] [place] [address] Phone: [phone] Fax: [phone]  Uses pill box? Yes Pt endorses good compliance  We discussed: Benefits of medication synchronization, packaging and delivery as well as enhanced pharmacist oversight with [employer]. Patient decided to: Continue current medication management strategy  Care Plan and Follow Up Patient Decision:  Patient agrees to Care Plan and Follow-up.  Plan: Telephone follow up appointment with care management team member scheduled for:  10/2021

## 2021-04-08 NOTE — Patient Instructions (Signed)
Visit Information   Goals Addressed             This Visit's Progress    Learn More About My Health       Timeframe:  Long-Range Goal Priority:  High Start Date:                             Expected End Date:                        Follow Up Date 10/2021    - make a list of questions - ask questions - repeat what I heard to make sure I understand - bring a list of my medicines to the visit    Why is this important?   The best way to learn about your health and care is by talking to the doctor and nurse.  They will answer your questions and give you information in the way that you like best.    Notes:      Manage My Medicine       Timeframe:  Long-Range Goal Priority:  High Start Date:                             Expected End Date:                       Follow Up Date 10/2021    - call for medicine refill 2 or 3 days before it runs out - keep a list of all the medicines I take; vitamins and herbals too - use a pillbox to sort medicine    Why is this important?   These steps will help you keep on track with your medicines.   Notes:      Track and Manage Heart Rate and Rhythm-Atrial Fibrillation       Timeframe:  Long-Range Goal Priority:  High Start Date:                             Expected End Date:                       Follow Up Date 10/2021    - take medicine as prescribed    Why is this important?   Atrial fibrillation may have no symptoms. Sometimes the symptoms get worse or happen more often.  It is important to keep track of what your symptoms are and when they happen.  A change in symptoms is important to discuss with your doctor or nurse.  Being active and healthy eating will also help you manage your heart condition.     Notes:        Patient Care Plan: CCM Pharmacy Care Plan     Problem Identified: hld, afib   Priority: High  Onset Date: 04/13/2021     Long-Range Goal: Disease State Management   Start Date: 04/13/2021  Expected End  Date: 04/13/2022  This Visit's Progress: On track  Priority: High  Note:    Current Barriers:  Unable to self administer medications as prescribed  Pharmacist Clinical Goal(s):  Patient will achieve ability to self administer medications as prescribed through use of pill box, daughter and caregivers as evidenced by patient report through collaboration with PharmD and provider.   Interventions: 1:1  collaboration with Abigail Miyamoto, MD regarding development and update of comprehensive plan of care as evidenced by provider attestation and co-signature Inter-disciplinary care team collaboration (see longitudinal plan of care) Comprehensive medication review performed; medication list updated in electronic medical record  Atrial Fibrillation (Goal: prevent stroke and major bleeding) -Controlled -CHADSVASC: 6 -Current treatment: Rate control: acebutolol 200 mg daily  Anticoagulation: eliquis 2.5 mg bid  -Medications previously tried: none reported -Home BP and HR readings: 117/70s. 107/70 mmHg -Counseled on increased risk of stroke due to Afib and benefits of anticoagulation for stroke prevention; bleeding risk associated with Eliquis and importance of self-monitoring for signs/symptoms of bleeding; -Recommended to continue current medication   Hyperlipidemia: (LDL goal < 55) -Controlled -Current treatment: atorvastatin 40 mg daily  -Medications previously tried: none reported  -Current dietary patterns: limited appetite -Current exercise habits: moving less lately -Educated on Cholesterol goals;  -Recommended to continue current medication  Patient Goals/Self-Care Activities Patient will:  - take medications as prescribed focus on medication adherence by using pill box and caregiver  Follow Up Plan: Telephone follow up appointment with care management team member scheduled for: 10/2021       The patient verbalized understanding of instructions, educational  materials, and care plan provided today and declined offer to receive copy of patient instructions, educational materials, and care plan.  Telephone follow up appointment with pharmacy team member scheduled for: 10/2021  Earvin Hansen, [facility]

## 2021-04-10 ENCOUNTER — Encounter: Payer: Self-pay | Admitting: Physician Assistant

## 2021-04-10 ENCOUNTER — Other Ambulatory Visit: Payer: Self-pay

## 2021-04-10 ENCOUNTER — Ambulatory Visit: Payer: PPO | Admitting: Physician Assistant

## 2021-04-10 VITALS — BP 127/80 | HR 80 | Resp 18 | Ht 63.0 in | Wt 100.0 lb

## 2021-04-10 DIAGNOSIS — R413 Other amnesia: Secondary | ICD-10-CM

## 2021-04-10 MED ORDER — DONEPEZIL HCL 10 MG PO TABS: ORAL_TABLET | ORAL | 3 refills | Status: DC
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
## 2021-04-10 NOTE — Patient Instructions (Addendum)
It was a pleasure to see you today at our office.   Recommendations:  Meds: Follow up in 3 months  MRI of the brain  We will start donepezil  (Aricept) half tablet (5mg ) daily for 2  weeks.  If you are tolerating the medication, then after 2 weeks, we will increase the dose to a full tablet of 10 mg daily.  Side effects include nausea, vomiting, diarrhea, vivid dreams, and muscle cramps.  Please call the clinic if you experience any of these symptoms.  Consider the day programs and the day to day care at home Please contact Dr. for assessment of decision mental capacity [phone]    RECOMMENDATIONS FOR ALL PATIENTS WITH MEMORY PROBLEMS: 1. Continue to exercise (Recommend 30 minutes of walking everyday, or 3 hours every week) 2. Increase social interactions - continue going to [place] and enjoy social gatherings with friends and family 3. Eat healthy, avoid fried foods and eat more fruits and vegetables 4. Maintain adequate blood pressure, blood sugar, and blood cholesterol level. Reducing the risk of stroke and cardiovascular disease also helps promoting better memory. 5. Avoid stressful situations. Live a simple life and avoid aggravations. Organize your time and prepare for the next day in anticipation. 6. Sleep well, avoid any interruptions of sleep and avoid any distractions in the bedroom that may interfere with adequate sleep quality 7. Avoid sugar, avoid sweets as there is a strong link between excessive sugar intake, diabetes, and cognitive impairment We discussed the Mediterranean diet, which has been shown to help patients reduce the risk of progressive memory disorders and reduces cardiovascular risk. This includes eating fish, eat fruits and green leafy vegetables, nuts like almonds and hazelnuts, walnuts, and also use olive oil. Avoid fast foods and fried foods as much as possible. Avoid sweets and sugar as sugar use has been linked to worsening of memory  function.  There is always a concern of gradual progression of memory problems. If this is the case, then we may need to adjust level of care according to patient needs. Support, both to the patient and caregiver, should then be put into place.    The Alzheimer[employer] Association is here all day, every day for people facing Alzheimer's disease through our free 24/7 Helpline: [phone]. The Helpline provides reliable information and support to all those who need assistance, such as individuals living with memory loss, Alzheimer's or other dementia, caregivers, health care professionals and the public.  Our highly trained and knowledgeable staff can help you with: Understanding memory loss, dementia and Alzheimer's  Medications and other treatment options  General information about aging and brain health  Skills to provide quality care and to find the best care from professionals  Legal, financial and living-arrangement decisions Our Helpline also features: Confidential care consultation provided by master's level clinicians who can help with decision-making support, crisis assistance and education on issues families face every day  Help in a caller's preferred language using our translation service that features more than 200 languages and dialects  Referrals to local community programs, services and ongoing support     FALL PRECAUTIONS: Be cautious when walking. Scan the area for obstacles that may increase the risk of trips and falls. When getting up in the mornings, sit up at the edge of the bed for a few minutes before getting out of bed. Consider elevating the bed at the head end to avoid drop of blood pressure when getting up. Walk always in a well-lit room (  use night lights in the walls). Avoid area rugs or power cords from appliances in the middle of the walkways. Use a walker or a cane if necessary and consider physical therapy for balance exercise. Get your eyesight checked  regularly.   HOME SAFETY: Consider the safety of the kitchen when operating appliances like stoves, microwave oven, and blender. Consider having supervision and share cooking responsibilities until no longer able to participate in those. Accidents with firearms and other hazards in the house should be identified and addressed as well.   ABILITY TO BE LEFT ALONE: If patient is unable to contact 911 operator, consider using LifeLine, or when the need is there, arrange for someone to stay with patients. Smoking is a fire hazard, consider supervision or cessation. Risk of wandering should be assessed by caregiver and if detected at any point, supervision and safe proof recommendations should be instituted.  MEDICATION SUPERVISION: Inability to self-administer medication needs to be constantly addressed. Implement a mechanism to ensure safe administration of the medications.   DRIVING: Regarding driving, in patients with progressive memory problems, driving will be impaired. We advise to have someone else do the driving if trouble finding directions or if minor accidents are reported. Independent driving assessment is available to determine safety of driving.        Mediterranean Diet A Mediterranean diet refers to food and lifestyle choices that are based on the traditions of countries located on the [employer]. This way of eating has been shown to help prevent certain conditions and improve outcomes for people who have chronic diseases, like kidney disease and heart disease. What are tips for following this plan? Lifestyle  Cook and eat meals together with your family, when possible. Drink enough fluid to keep your urine clear or pale yellow. Be physically active every day. This includes: Aerobic exercise like running or swimming. Leisure activities like gardening, walking, or housework. Get 7-8 hours of sleep each night. If recommended by your health care provider, drink red wine in  moderation. This means 1 glass a day for nonpregnant women and 2 glasses a day for men. A glass of wine equals 5 oz (150 mL). Reading food labels  Check the serving size of packaged foods. For foods such as rice and pasta, the serving size refers to the amount of cooked product, not dry. Check the total fat in packaged foods. Avoid foods that have saturated fat or trans fats. Check the ingredients list for added sugars, such as corn syrup. Shopping  At the grocery store, buy most of your food from the areas near the walls of the store. This includes: Fresh fruits and vegetables (produce). Grains, beans, nuts, and seeds. Some of these may be available in unpackaged forms or large amounts (in bulk). Fresh seafood. Poultry and eggs. Low-fat dairy products. Buy whole ingredients instead of prepackaged foods. Buy fresh fruits and vegetables in-season from local farmers markets. Buy frozen fruits and vegetables in resealable bags. If you do not have access to quality fresh seafood, buy precooked frozen shrimp or canned fish, such as tuna, salmon, or sardines. Buy small amounts of raw or cooked vegetables, salads, or olives from the deli or salad bar at your store. Stock your pantry so you always have certain foods on hand, such as olive oil, canned tuna, canned tomatoes, rice, pasta, and beans. Cooking  Cook foods with extra-virgin olive oil instead of using butter or other vegetable oils. Have meat as a side dish, and have vegetables or  grains as your main dish. This means having meat in small portions or adding small amounts of meat to foods like pasta or stew. Use beans or vegetables instead of meat in common dishes like chili or lasagna. Experiment with different cooking methods. Try roasting or broiling vegetables instead of steaming or sauteing them. Add frozen vegetables to soups, stews, pasta, or rice. Add nuts or seeds for added healthy fat at each meal. You can add these to yogurt,  salads, or vegetable dishes. Marinate fish or vegetables using olive oil, lemon juice, garlic, and fresh herbs. Meal planning  Plan to eat 1 vegetarian meal one day each week. Try to work up to 2 vegetarian meals, if possible. Eat seafood 2 or more times a week. Have healthy snacks readily available, such as: Vegetable sticks with hummus. Greek yogurt. Fruit and nut trail mix. Eat balanced meals throughout the week. This includes: Fruit: 2-3 servings a day Vegetables: 4-5 servings a day Low-fat dairy: 2 servings a day Fish, poultry, or lean meat: 1 serving a day Beans and legumes: 2 or more servings a week Nuts and seeds: 1-2 servings a day Whole grains: 6-8 servings a day Extra-virgin olive oil: 3-4 servings a day Limit red meat and sweets to only a few servings a month What are my food choices? Mediterranean diet Recommended Grains: Whole-grain pasta. Brown rice. Bulgar wheat. Polenta. Couscous. Whole-wheat bread. Orpah Cobb. Vegetables: Artichokes. Beets. Broccoli. Cabbage. Carrots. Eggplant. Green beans. Chard. Kale. Spinach. Onions. Leeks. Peas. Squash. Tomatoes. Peppers. Radishes. Fruits: Apples. Apricots. Avocado. Berries. Bananas. Cherries. Dates. Figs. Grapes. Lemons. Melon. Oranges. Peaches. Plums. Pomegranate. Meats and other protein foods: Beans. Almonds. Sunflower seeds. Pine nuts. Peanuts. Cod. Salmon. Scallops. Shrimp. Tuna. Tilapia. Clams. Oysters. Eggs. Dairy: Low-fat milk. Cheese. Greek yogurt. Beverages: Water. Red wine. Herbal tea. Fats and oils: Extra virgin olive oil. Avocado oil. Grape seed oil. Sweets and desserts: [place] yogurt with honey. Baked apples. Poached pears. Trail mix. Seasoning and other foods: Basil. Cilantro. Coriander. Cumin. Mint. Parsley. Sage. Rosemary. Tarragon. Garlic. Oregano. Thyme. Pepper. Balsalmic vinegar. Tahini. Hummus. Tomato sauce. Olives. Mushrooms. Limit these Grains: Prepackaged pasta or rice dishes. Prepackaged cereal with  added sugar. Vegetables: Deep fried potatoes (french fries). Fruits: Fruit canned in syrup. Meats and other protein foods: Beef. Pork. Lamb. Poultry with skin. Hot dogs. Tomasa Blase. Dairy: Ice cream. Sour cream. Whole milk. Beverages: Juice. Sugar-sweetened soft drinks. Beer. Liquor and spirits. Fats and oils: Butter. Canola oil. Vegetable oil. Beef fat (tallow). Lard. Sweets and desserts: Cookies. Cakes. Pies. Candy. Seasoning and other foods: Mayonnaise. Premade sauces and marinades. The items listed may not be a complete list. Talk with your dietitian about what dietary choices are right for you. Summary The Mediterranean diet includes both food and lifestyle choices. Eat a variety of fresh fruits and vegetables, beans, nuts, seeds, and whole grains. Limit the amount of red meat and sweets that you eat. Talk with your health care provider about whether it is safe for you to drink red wine in moderation. This means 1 glass a day for nonpregnant women and 2 glasses a day for men. A glass of wine equals 5 oz (150 mL). This information is not intended to replace advice given to you by your health care provider. Make sure you discuss any questions you have with your health care provider. Document Released: 04/10/2016 Document Revised: 05/13/2016 Document Reviewed: 04/10/2016 Elsevier Interactive Patient Education  2017 [employer].

## 2021-04-22 ENCOUNTER — Other Ambulatory Visit: Payer: Self-pay | Admitting: Physician Assistant

## 2021-04-22 ENCOUNTER — Encounter: Payer: Self-pay | Admitting: Physician Assistant

## 2021-04-22 MED ORDER — DONEPEZIL HCL 5 MG PO TABS: ORAL_TABLET | ORAL | 11 refills | Status: DC

## 2021-04-22 NOTE — Progress Notes (Signed)
Patient to take Aricept 5 mg daily, unable to tolerate 10 mg daily

## 2021-04-25 ENCOUNTER — Other Ambulatory Visit: Payer: Self-pay

## 2021-04-25 ENCOUNTER — Ambulatory Visit
Admission: RE | Admit: 2021-04-25 | Discharge: 2021-04-25 | Disposition: A | Discharge status: Home / Self Care | Payer: PPO | Source: Ambulatory Visit | Attending: Physician Assistant | Admitting: Physician Assistant

## 2021-04-25 DIAGNOSIS — I639 Cerebral infarction, unspecified: Secondary | ICD-10-CM | POA: Diagnosis not present

## 2021-04-25 DIAGNOSIS — R413 Other amnesia: Secondary | ICD-10-CM | POA: Diagnosis not present

## 2021-04-25 DIAGNOSIS — G319 Degenerative disease of nervous system, unspecified: Secondary | ICD-10-CM | POA: Diagnosis not present

## 2021-04-30 NOTE — Progress Notes (Signed)
Advised patient of MRI results, voiced understanding and verbally understood results.

## 2021-05-03 ENCOUNTER — Telehealth: Payer: Self-pay | Admitting: Neurology

## 2021-05-03 ENCOUNTER — Other Ambulatory Visit: Payer: Self-pay | Admitting: Legal Medicine

## 2021-05-03 DIAGNOSIS — F0151 Vascular dementia with behavioral disturbance: Secondary | ICD-10-CM

## 2021-05-03 DIAGNOSIS — F01518 Vascular dementia, unspecified severity, with other behavioral disturbance: Secondary | ICD-10-CM

## 2021-05-03 NOTE — Telephone Encounter (Signed)
I spoke to the patient's daughter and discussed the offered prescription change to divalproex sprinkles. For now, she would like to continue the 500mg  ER tablet at bedtime w/ applesauce to see if she can swallow it better. If this does not work, she will let our office know to send in the new rx for sprinkles.

## 2021-05-03 NOTE — Telephone Encounter (Signed)
Pt's daughter returned phone call. Ms. [name] ask if you would communication through the portal, would be the best way.

## 2021-05-03 NOTE — Telephone Encounter (Signed)
Called her daughter, left message for her to call back.  Patient had history of seizure. Follow visit in Jan 2022, Depakote ER 500mg  qhs to replace keppra 250mg  bid for seizure and agitation.  Please try to call patient's daughter again, if Depakote works well for her, may switch to Depakote Sprinkler 125mg  2 bid.

## 2021-05-13 NOTE — Progress Notes (Signed)
Cardiology Office Note:    Date:  05/14/2021   ID:  [name], DOB 19-Nov-1925, MRN [id number]  PCP:  Katherine Miyamoto, MD  Cardiologist:  Katherine Herrlich, MD    Referring MD: [name],*    ASSESSMENT:    1. Premature ventricular contractions   2. Paroxysmal atrial fibrillation (HCC)   3. PAT (paroxysmal atrial tachycardia) (HCC)   4. Current use of long term anticoagulation   5. Pure hypercholesterolemia   6. Falls frequently    PLAN:    In order of problems listed above:  Overall from a cardiology perspective she is stable she tolerates reduced dose anticoagulation on the basis of body mass and hemorrhage and a minimum dose of beta-blocker which controls her atrial arrhythmia no recurrent clinical fibrillation or flutter or tachycardia.  Continue the same Stable she will continue her statin with stroke Her daughter is taking appropriate safety measures at home she is on reduced dose direct anticoagulant with less potential for traumatic bleeding   Next appointment: 9 months   Medication Adjustments/Labs and Tests Ordered: Current medicines are reviewed at length with the patient today.  Concerns regarding medicines are outlined above.  Orders Placed This Encounter  Procedures   EKG 12-Lead   No orders of the defined types were placed in this encounter.   Chief Complaint  Patient presents with   Follow-up    History of Present Illness:    [name] is a 87 y.o. female with a hx of paroxysmal atrial fibrillation and paroxysmal atrial tachycardia with stroke in March 2020 and long-term anticoagulation with Xarelto and hyperlipidemia last seen 11/13/2020 with hypotension.  She had a morning cortisol level checked which was normal CBC showed normal hemoglobin and renal function remain normal creatinine 0.7 GFR 81 cc/min  Compliance with diet, lifestyle and medications: Yes Her daughter is present participates makes decisions with her  mother's dementia which is worsening. She has a good mechanism of care at home and safety but decided her mother has fallen several times and will not use a cane. There has been no syncope and blood pressure at home tends to run in the 100 -105/60 they are not getting hypotension in the house. She has not had palpitation chest pain edema shortness of breath. She tolerates her anticoagulant without bleeding. Past Medical History:  Diagnosis Date   Acute ischemic stroke (HCC) 11/08/2018   Adjustment disorder with depressed mood 10/16/2019   Alteration consciousness 03/08/2020   Apraxia following cerebrovascular accident 10/16/2019   B12 deficiency 10/16/2019   Body mass index (BMI) less than 19 02/16/2020   Cerebrovascular accident (CVA) (HCC) 03/08/2020   Chronic kidney disease, stage II (mild) 12/06/2019   Closed fracture of one rib of right side with routine healing 06/30/2017   Confusion    COPD (chronic obstructive pulmonary disease) (HCC) 11/16/2017   Current use of long term anticoagulation 07/21/2017   Dementia, multiinfarct, with behavioral disturbance (HCC) 01/18/2020   Depression    Essential hypertension, benign 10/16/2019   First degree atrioventricular block 06/26/2017   Hammer toe 06/26/2017   Hearing loss    Hyperlipidemia 06/26/2017   Malnutrition of moderate degree (HCC) 12/06/2019   Memory loss    Other specified cardiac arrhythmias 06/26/2017   Pain in limb 06/26/2017   Palpitations 06/26/2017   Paroxysmal atrial fibrillation (HCC) 06/26/2017   Formatting of this note might be different from the original. Overview:  remote history post operatively, CHADS 2 score=2 Formatting of  this note might be different from the original. 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 10/16/2019    Stroke [place])    Thoracic aorta atherosclerosis (HCC) 05/22/2020   Traumatic pneumothorax 06/30/2017    Past Surgical History:  Procedure Laterality Date   ABDOMINAL HYSTERECTOMY     SHOULDER SURGERY     TONSILLECTOMY      Current Medications: Current Meds  Medication Sig   acebutolol (SECTRAL) 200 MG capsule TAKE 1 CAPSULE(200 MG) BY MOUTH DAILY   apixaban (ELIQUIS) 2.5 MG TABS tablet TAKE 1 TABLET(2.5 MG) BY MOUTH TWICE DAILY   atorvastatin (LIPITOR) 40 MG tablet TAKE 1 TABLET(40 MG) BY MOUTH DAILY   Cholecalciferol (VITAMIN D3) 1000 units CAPS Take 1,000 Units by mouth daily.   divalproex (DEPAKOTE ER) 500 MG 24 hr tablet Take 1 tablet (500 mg total) by mouth at bedtime.   docusate sodium (COLACE) 100 MG capsule Take 100 mg by mouth daily.   donepezil (ARICEPT) 5 MG tablet Take 1 tablet ( 5 mg ) daily   melatonin 1 MG TABS tablet Take 1 mg by mouth at bedtime as needed (SLEEP).   mirtazapine (REMERON) 30 MG tablet TAKE 1 TABLET(30 MG) BY MOUTH AT BEDTIME   Multiple Vitamins-Minerals (PRESERVISION AREDS 2) CAPS Take by mouth.   OVER THE COUNTER MEDICATION Take 1 tablet by mouth daily. Selenex GSH supplement   prednisoLONE acetate (PRED FORTE) 1 % ophthalmic suspension    vitamin B-12 (CYANOCOBALAMIN) 500 MCG tablet Take 500 mcg by mouth daily.      Allergies:   Codeine, Penicillins, and Sulfa antibiotics   Social History   Socioeconomic History   Marital status: Widowed    Spouse name: Not on file   Number of children: 2   Years of education: 12   Highest education level: High school graduate  Occupational History   Occupation: Retired  Tobacco Use   Smoking status: Never   Smokeless tobacco: Never  Vaping Use   Vaping Use: Never used  Substance and Sexual Activity   Alcohol use: No   Drug use: No   Sexual activity: Not Currently  Other Topics Concern   Not on file  Social History Narrative   [name] is widowed. Her daughter, [name], is her POA and  lives 12 miles away. She resides in her own home, alone for last 7 years. Since her CVA she has had someone with her most of the time. She has a hired caregiver or a family member stays most of the day. She feel safe in her home and in her neighborhood.   Right-handed.   0.5 cups caffeine per day.   Social Determinants of Health   Financial Resource Strain: Not on file  Food Insecurity: Not on file  Transportation Needs: Not on file  Physical Activity: Not on file  Stress: Not on file  Social Connections: Not on file     Family History: The patient's family history includes Alzheimer's disease in her sister; Bone cancer in her mother; Breast cancer in her father and sister; Dementia in her brother; Diabetes in her father; Heart disease in her father. ROS:   Please see the history of present illness.    All other systems reviewed and are negative.  EKGs/Labs/Other Studies Reviewed:    The following studies were reviewed today:  EKG:  EKG ordered today and personally reviewed.  The ekg ordered today demonstrates sinus rhythm first-degree AV block poor R wave progression.  Recent Labs: 05/22/2020: TSH 1.460 03/18/2021: ALT 15; BUN 19; Creatinine, Ser 0.76; Hemoglobin 11.7; Platelets 203; Potassium 5.2; Sodium 143  Recent Lipid Panel    Component Value Date/Time   CHOL 112 03/18/2021 1045   TRIG 78 03/18/2021 1045   HDL 46 03/18/2021 1045   CHOLHDL 2.4 03/18/2021 1045   CHOLHDL 4.3 11/09/2018 0512   VLDL 11 11/09/2018 0512   LDLCALC 50 03/18/2021 1045    Physical Exam:    VS:  BP 99/64 (BP Location: Right Arm, Patient Position: Sitting)   Pulse 88   Ht 5\' 3"  (1.6 m)   Wt 101 lb 6.4 oz (46 kg)   LMP  (LMP Unknown)   SpO2 96%   BMI 17.96 kg/m     Wt Readings from Last 3 Encounters:  05/14/21 101 lb 6.4 oz (46 kg)  04/10/21 100 lb (45.4 kg)  03/28/21 103 lb (46.7 kg)     GEN: She looks increasingly frail in no acute distress very thin BMI less than 18 HEENT:  Normal NECK: No JVD; No carotid bruits LYMPHATICS: No lymphadenopathy CARDIAC: RRR, no murmurs, rubs, gallops RESPIRATORY:  Clear to auscultation without rales, wheezing or rhonchi  ABDOMEN: Soft, non-tender, non-distended MUSCULOSKELETAL:  No edema; No deformity  SKIN: Warm and dry NEUROLOGIC:  Alert and oriented x 3 PSYCHIATRIC:  Normal affect    Signed, 03/30/21, MD  05/14/2021 11:55 AM    [facility]

## 2021-05-14 ENCOUNTER — Other Ambulatory Visit: Payer: Self-pay

## 2021-05-14 ENCOUNTER — Encounter: Payer: Self-pay | Admitting: Cardiology

## 2021-05-14 ENCOUNTER — Ambulatory Visit: Payer: PPO | Admitting: Cardiology

## 2021-05-14 VITALS — BP 99/64 | HR 88 | Ht 63.0 in | Wt 101.4 lb

## 2021-05-14 DIAGNOSIS — I493 Ventricular premature depolarization: Secondary | ICD-10-CM | POA: Diagnosis not present

## 2021-05-14 DIAGNOSIS — E7800 Pure hypercholesterolemia, unspecified: Secondary | ICD-10-CM | POA: Diagnosis not present

## 2021-05-14 DIAGNOSIS — I471 Supraventricular tachycardia: Secondary | ICD-10-CM

## 2021-05-14 DIAGNOSIS — R296 Repeated falls: Secondary | ICD-10-CM | POA: Diagnosis not present

## 2021-05-14 DIAGNOSIS — I480 Paroxysmal atrial fibrillation: Secondary | ICD-10-CM

## 2021-05-14 DIAGNOSIS — Z7901 Long term (current) use of anticoagulants: Secondary | ICD-10-CM | POA: Diagnosis not present

## 2021-05-14 DIAGNOSIS — I4719 Other supraventricular tachycardia: Secondary | ICD-10-CM

## 2021-05-14 NOTE — Patient Instructions (Signed)

## 2021-05-17 ENCOUNTER — Ambulatory Visit: Payer: PPO | Admitting: Cardiology

## 2021-06-05 ENCOUNTER — Encounter: Payer: Self-pay | Admitting: Legal Medicine

## 2021-06-18 ENCOUNTER — Other Ambulatory Visit: Payer: Self-pay | Admitting: Cardiology

## 2021-06-18 NOTE — Telephone Encounter (Signed)
Prescription refill request for Eliquis received. Indication: afib Last office visit:[place] 05/14/21 Scr: 0.76 03/18/21 Age: 23f Weight:46kg

## 2021-06-22 ENCOUNTER — Other Ambulatory Visit: Payer: Self-pay | Admitting: Neurology

## 2021-06-26 ENCOUNTER — Other Ambulatory Visit: Payer: Self-pay | Admitting: Neurology

## 2021-07-05 ENCOUNTER — Ambulatory Visit (INDEPENDENT_AMBULATORY_CARE_PROVIDER_SITE_OTHER): Payer: PPO | Admitting: Nurse Practitioner

## 2021-07-05 ENCOUNTER — Other Ambulatory Visit: Payer: Self-pay

## 2021-07-05 ENCOUNTER — Encounter: Payer: Self-pay | Admitting: Nurse Practitioner

## 2021-07-05 VITALS — BP 112/62 | HR 70 | Temp 97.1°F | Ht 63.0 in | Wt 104.0 lb

## 2021-07-05 DIAGNOSIS — R638 Other symptoms and signs concerning food and fluid intake: Secondary | ICD-10-CM

## 2021-07-05 DIAGNOSIS — R34 Anuria and oliguria: Secondary | ICD-10-CM | POA: Diagnosis not present

## 2021-07-05 LAB — CBC WITH DIFFERENTIAL/PLATELET
Basophils Absolute: 0 10*3/uL (ref 0.0–0.2)
Basos: 1 %
EOS (ABSOLUTE): 0.1 10*3/uL (ref 0.0–0.4)
Eos: 2 %
Hematocrit: 33 % — ABNORMAL LOW (ref 34.0–46.6)
Hemoglobin: 11.6 g/dL (ref 11.1–15.9)
Immature Grans (Abs): 0 10*3/uL (ref 0.0–0.1)
Immature Granulocytes: 0 %
Lymphocytes Absolute: 2 10*3/uL (ref 0.7–3.1)
Lymphs: 37 %
MCH: 34.5 pg — ABNORMAL HIGH (ref 26.6–33.0)
MCHC: 35.2 g/dL (ref 31.5–35.7)
MCV: 98 fL — ABNORMAL HIGH (ref 79–97)
Monocytes Absolute: 0.7 10*3/uL (ref 0.1–0.9)
Monocytes: 12 %
Neutrophils Absolute: 2.6 10*3/uL (ref 1.4–7.0)
Neutrophils: 48 %
Platelets: 195 10*3/uL (ref 150–450)
RBC: 3.36 x10E6/uL — ABNORMAL LOW (ref 3.77–5.28)
RDW: 12.8 % (ref 11.7–15.4)
WBC: 5.4 10*3/uL (ref 3.4–10.8)

## 2021-07-05 LAB — COMPREHENSIVE METABOLIC PANEL
ALT: 14 IU/L (ref 0–32)
AST: 22 IU/L (ref 0–40)
Albumin/Globulin Ratio: 1.9 (ref 1.2–2.2)
Albumin: 4 g/dL (ref 3.5–4.6)
Alkaline Phosphatase: 67 IU/L (ref 44–121)
BUN/Creatinine Ratio: 16 (ref 12–28)
BUN: 12 mg/dL (ref 10–36)
Bilirubin Total: 0.5 mg/dL (ref 0.0–1.2)
CO2: 26 mmol/L (ref 20–29)
Calcium: 8.8 mg/dL (ref 8.7–10.3)
Chloride: 102 mmol/L (ref 96–106)
Creatinine, Ser: 0.74 mg/dL (ref 0.57–1.00)
Globulin, Total: 2.1 g/dL (ref 1.5–4.5)
Glucose: 79 mg/dL (ref 70–99)
Potassium: 5 mmol/L (ref 3.5–5.2)
Sodium: 139 mmol/L (ref 134–144)
Total Protein: 6.1 g/dL (ref 6.0–8.5)
eGFR: 75 mL/min/{1.73_m2} (ref >59)

## 2021-07-05 NOTE — Patient Instructions (Signed)
High fiber diet Push fluids, especially water  Return urine sample to office Follow-up as needed  Dehydration, Elderly  Dehydration is condition in which there is not enough water or other fluids in the body. This happens when a person loses more fluids than he or she takes in. Important body parts cannot work right without the right amount of fluids. Any loss of fluids from the body can cause dehydration. People 85 years of age or older have a higher risk of dehydration than younger adults. This is because in older age, the body: Is less able to keep the right amount of water. Does not respond to temperature changes as well. Does not get a sense of thirst as easily or quickly. Dehydration can be mild, worse, or very bad. It should be treated right away to keep it from getting very bad. What are the causes? This condition may be caused by: Conditions that cause loss of water or other fluids, such as: Watery poop (diarrhea). Vomiting. Sweating a lot. Peeing (urinating) a lot. Not drinking enough fluids, especially when you: Are ill. Are doing things that take a lot of energy to do. Other illnesses and conditions, such as fever or infection. Certain medicines, such as medicines that take extra fluid out of the body (diuretics). Lack of safe drinking water. Not being able to get enough water and food. What increases the risk? The following factors may make you more likely to develop this condition: Having a long-term (chronic) illness that has not been treated the right way, such as: An illness that may cause you to pee more, such as diabetes. Kidney, heart, or lung disease. A condition such as dementia. This affects: The brain and nervous system. Thinking. Feelings. Being 75 years of age or older. Having a disability. Living in a place that is high above the ground or sea (high in altitude). The thinner, drier air causes more fluid loss. What are the signs or symptoms? Symptoms  of dehydration depend on how bad it is. Mild or worse dehydration Thirst. Dry lips or dry mouth. Feeling dizzy or light-headed, especially when you stand up from sitting. Muscle cramps. Your body making: Dark pee (urine). Pee may be the color of tea. Less pee than normal. Less tears than normal. Headache. Very bad dehydration Changes in skin. Skin may: Be cold to the touch (clammy). Be blotchy or pale. Not go back to normal right after you lightly pinch it and let it go. Little or no tears, pee, or sweat. Changes in vital signs, such as: Fast breathing. Low blood pressure. Weak pulse. Pulse that is more than 100 beats a minute when you are sitting still. Other changes, such as: Feeling very thirsty. Eyes that look hollow (sunken). Cold hands and feet. Being mixed up (confused). Being very tired (lethargic) or having trouble waking from sleep. Short-term weight loss. Loss of consciousness. How is this treated? Treatment for this condition depends on how bad it is. Treatment should start right away. Do not wait until your condition gets very bad. Very bad dehydration is an emergency. You will need to go to a hospital. Mild or worse dehydration can be treated at home. You may be asked to: Drink more fluids. Drink an oral rehydration solution (ORS). This drink helps get the right amounts of fluids and salts and minerals in the blood (electrolytes). Very bad dehydration can be treated: With fluids through an IV tube. By getting normal levels of salts and minerals in your blood. This  is often done by giving salts and minerals through a tube. The tube is passed through your nose and into your stomach. By treating the root cause. Follow these instructions at home: Oral rehydration solution If told by your doctor, drink an ORS: Make an ORS. Use instructions on the package. Start by drinking small amounts, about  cup (120 mL) every 5-10 minutes. Slowly drink more until you have had  the amount that your doctor said to have. Eating and drinking     Drink enough clear fluid to keep your pee pale yellow. If you were told to drink an ORS, finish the ORS first. Then, start slowly drinking other clear fluids. Drink fluids such as: Water. Do not drink only water. Doing that can make the salt (sodium) level in your body get too low. Water from ice chips you suck on. Fruit juice that you have added water to (diluted). Low-calorie sports drinks. Eat foods that have the right amounts of salts and minerals, such as: Bananas. Oranges. Potatoes. Tomatoes. Spinach. Do not drink alcohol. Avoid: Drinks that have a lot of sugar. These include: High-calorie sports drinks. Fruit juice that you did not add water to. Soda. Caffeine. Foods that are greasy or have a lot of fat or sugar. General instructions Take over-the-counter and prescription medicines only as told by your doctor. Do not take salt tablets. Doing that can make the salt level in your body get too high. Return to your normal activities as told by your doctor. Ask your doctor what activities are safe for you. Keep all follow-up visits as told by your doctor. This is important. Contact a doctor if: You have pain in your belly (abdomen) and the pain: Gets worse. Stays in one place. You have a rash. You have a stiff neck. You get angry or annoyed (irritable) more easily than normal. You are more tired or have a harder time waking than normal. You feel: Weak or dizzy. Very thirsty. Get help right away if you have: Any symptoms of very bad dehydration. A fever. A very bad headache. Symptoms of vomiting, such as: Your vomiting gets worse or does not go away. Your vomit has blood or green stuff in it. You cannot eat or drink without vomiting. Problems with peeing or pooping (having a bowel movement), such as: Watery poop that gets worse or does not go away. Blood in your poop (stool). This may cause poop to  look black and tarry. Not peeing in 6-8 hours. Peeing only a small amount of very dark pee in 6-8 hours. Trouble breathing. Symptoms that get worse with treatment. These symptoms may be an emergency. Do not wait to see if the symptoms will go away. Get medical help right away. Call your local emergency services (911 in the [place] Do not drive yourself to the hospital. Summary Dehydration is a condition in which there is not enough water or other fluids in the body. This happens when a person loses more fluids than he or she takes in. Treatment for this condition depends on how bad it is. Treatment should be started right away. Do not wait until your condition gets very bad. Drink enough clear fluid to keep your pee pale yellow. If you were told to drink an oral rehydration solution (ORS), finish the ORS first. Then, start slowly drinking other clear fluids. Take over-the-counter and prescription medicines only as told by your doctor. Get help right away if you have any symptoms of very bad dehydration. This information  is not intended to replace advice given to you by your health care provider. Make sure you discuss any questions you have with your health care provider. Document Revised: 03/31/2019 Document Reviewed: 03/31/2019 Elsevier Patient Education  2022 [employer]. Constipation, Adult Constipation is when a person has trouble pooping (having a bowel movement). When you have this condition, you may poop fewer than 3 times a week. Your poop (stool) may also be dry, hard, or bigger than normal. Follow these instructions at home: Eating and drinking  Eat foods that have a lot of fiber, such as: Fresh fruits and vegetables. Whole grains. Beans. Eat less of foods that are low in fiber and high in fat and sugar, such as: [place] fries. Hamburgers. Cookies. Candy. Soda. Drink enough fluid to keep your pee (urine) pale yellow. General instructions Exercise regularly or as told by your  doctor. Try to do 150 minutes of exercise each week. Go to the restroom when you feel like you need to poop. Do not hold it in. Take over-the-counter and prescription medicines only as told by your doctor. These include any fiber supplements. When you poop: Do deep breathing while relaxing your lower belly (abdomen). Relax your pelvic floor. The pelvic floor is a group of muscles that support the rectum, bladder, and intestines (as well as the uterus in women). Watch your condition for any changes. Tell your doctor if you notice any. Keep all follow-up visits as told by your doctor. This is important. Contact a doctor if: You have pain that gets worse. You have a fever. You have not pooped for 4 days. You vomit. You are not hungry. You lose weight. You are bleeding from the opening of the butt (anus). You have thin, pencil-like poop. Get help right away if: You have a fever, and your symptoms suddenly get worse. You leak poop or have blood in your poop. Your belly feels hard or bigger than normal (bloated). You have very bad belly pain. You feel dizzy or you faint. Summary Constipation is when a person poops fewer than 3 times a week, has trouble pooping, or has poop that is dry, hard, or bigger than normal. Eat foods that have a lot of fiber. Drink enough fluid to keep your pee (urine) pale yellow. Take over-the-counter and prescription medicines only as told by your doctor. These include any fiber supplements. This information is not intended to replace advice given to you by your health care provider. Make sure you discuss any questions you have with your health care provider. Document Revised: 07/06/2019 Document Reviewed: 07/06/2019 Elsevier Patient Education  2022 [employer].

## 2021-07-05 NOTE — Progress Notes (Signed)
Acute Office Visit  Subjective:    Patient ID: [name], female    DOB: 03/21/1926, 87 y.o.   MRN: [id number]  CC: Decreased urine output  HPI: [name] is a 87 year old Caucasian female that presents for evaluation of decreased urine output. She is accompanied by her adult daughter who helps supplement history. Pt is a poor historian, has a history of dementia.   Urinary symptoms  She reports new onset  decreased urinary frequency . The current episode started  2 weeks ago and is staying constant. Patient states symptoms are mild in intensity, occurring intermittently. She  has not been recently treated for similar symptoms. She admits to drinking very little water, drinks small amount of coffee in the morning.Pt unable to submit urine sample for analysis.    Associated symptoms: No abdominal pain No back pain  No chills No constipation  No cramping No diarrhea  No discharge No fever  No hematuria No nausea  No vomiting      Past Medical History:  Diagnosis Date   Acute ischemic stroke ([facility]) 11/08/2018   Adjustment disorder with depressed mood 10/16/2019   Alteration consciousness 03/08/2020   Apraxia following cerebrovascular accident 10/16/2019   B12 deficiency 10/16/2019   Body mass index (BMI) less than 19 02/16/2020   Cerebrovascular accident (CVA) ([facility]) 03/08/2020   Chronic kidney disease, stage II (mild) 12/06/2019   Closed fracture of one rib of right side with routine healing 06/30/2017   Confusion    COPD (chronic obstructive pulmonary disease) ([place]) 11/16/2017   Current use of long term anticoagulation 07/21/2017   Dementia, multiinfarct, with behavioral disturbance ([place]) 01/18/2020   Depression    Essential hypertension, benign 10/16/2019   First degree atrioventricular block 06/26/2017   Hammer toe 06/26/2017   Hearing loss    Hyperlipidemia 06/26/2017   Malnutrition of moderate degree ([place]) 12/06/2019   Memory loss    Other specified cardiac arrhythmias 06/26/2017    Pain in limb 06/26/2017   Palpitations 06/26/2017   Paroxysmal atrial fibrillation ([facility]) 06/26/2017   Formatting of this note might be different from the original. Overview:  remote history post operatively, CHADS 2 score=2 Formatting of this note might be different from the original. 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 10/16/2019   Stroke [facility])    Thoracic aorta atherosclerosis ([facility]) 05/22/2020   Traumatic pneumothorax 06/30/2017    Past Surgical History:  Procedure Laterality Date   ABDOMINAL HYSTERECTOMY     SHOULDER SURGERY     TONSILLECTOMY      Family History  Problem Relation Age of Onset   Bone cancer Mother    Breast cancer Father    Diabetes Father    Heart disease Father    Breast cancer Sister    Dementia Brother    Alzheimer's disease Sister     Social History   Socioeconomic History   Marital status: Widowed    Spouse name: Not on file   Number of children: 2   Years of education: 12   Highest education level: High school graduate  Occupational History   Occupation: Retired  Tobacco Use   Smoking status: Never   Smokeless tobacco: Never  Vaping Use   Vaping Use: Never used  Substance and Sexual Activity   Alcohol use: No   Drug use: No  Sexual activity: Not Currently  Other Topics Concern   Not on file  Social History Narrative   [name] is widowed. Her daughter, [name], is her POA and lives 12 miles away. She resides in her own home, alone for last 7 years. Since her CVA she has had someone with her most of the time. She has a hired caregiver or a family member stays most of the day. She feel safe in her home and in her neighborhood.   Right-handed.   0.5 cups caffeine per day.   Social Determinants of Health   Financial  Resource Strain: Not on file  Food Insecurity: Not on file  Transportation Needs: Not on file  Physical Activity: Not on file  Stress: Not on file  Social Connections: Not on file  Intimate Partner Violence: Not on file    Outpatient Medications Prior to Visit  Medication Sig Dispense Refill   acebutolol (SECTRAL) 200 MG capsule TAKE 1 CAPSULE(200 MG) BY MOUTH DAILY 90 capsule 3   apixaban (ELIQUIS) 2.5 MG TABS tablet TAKE 1 TABLET(2.5 MG) BY MOUTH TWICE DAILY 180 tablet 1   atorvastatin (LIPITOR) 40 MG tablet TAKE 1 TABLET(40 MG) BY MOUTH DAILY 90 tablet 2   Cholecalciferol (VITAMIN D3) 1000 units CAPS Take 1,000 Units by mouth daily.     divalproex (DEPAKOTE ER) 500 MG 24 hr tablet TAKE 1 TABLET(500 MG) BY MOUTH AT BEDTIME 30 tablet 1   docusate sodium (COLACE) 100 MG capsule Take 100 mg by mouth daily.     donepezil (ARICEPT) 5 MG tablet Take 1 tablet ( 5 mg ) daily 30 tablet 11   melatonin 1 MG TABS tablet Take 1 mg by mouth at bedtime as needed (SLEEP).     mirtazapine (REMERON) 30 MG tablet TAKE 1 TABLET(30 MG) BY MOUTH AT BEDTIME 90 tablet 1   Multiple Vitamins-Minerals (PRESERVISION AREDS 2) CAPS Take by mouth.     OVER THE COUNTER MEDICATION Take 1 tablet by mouth daily. Selenex GSH supplement     prednisoLONE acetate (PRED FORTE) 1 % ophthalmic suspension      vitamin B-12 (CYANOCOBALAMIN) 500 MCG tablet Take 500 mcg by mouth daily.      No facility-administered medications prior to visit.    Allergies  Allergen Reactions   Codeine Nausea And Vomiting   Penicillins Rash   Sulfa Antibiotics Nausea And Vomiting    Review of Systems  Constitutional:  Negative for chills and fever.  HENT:  Negative for congestion and sore throat.   Respiratory:  Negative for cough and shortness of breath.   Cardiovascular:  Negative for chest pain.  Gastrointestinal:  Negative for abdominal pain and nausea.  Genitourinary:  Negative for dysuria, flank pain, frequency and urgency.   Musculoskeletal:  Negative for back pain.      Objective:    Physical Exam Vitals reviewed.  Constitutional:      Comments: Thin, frail appearance  HENT:     Mouth/Throat:     Mouth: Mucous membranes are dry.  Cardiovascular:     Rate and Rhythm: Normal rate. Rhythm irregular.  Pulmonary:     Effort: Pulmonary effort is normal.     Breath sounds: Normal breath sounds.  Abdominal:     Palpations: Abdomen is soft.  Skin:    General: Skin is warm and dry.     Capillary Refill: Capillary refill takes less than 2 seconds.     Comments: tenting  Neurological:     Mental  Status: She is alert. Mental status is at baseline.    BP 112/62   Pulse 70   Temp (!) 97.1 F (36.2 C)   Ht _0  (1.6 m)   Wt 104 lb (47.2 kg)   LMP  (LMP Unknown)   SpO2 98%   BMI 18.42 kg/m   Wt Readings from Last 3 Encounters:  05/14/21 101 lb 6.4 oz (46 kg)  04/10/21 100 lb (45.4 kg)  03/28/21 103 lb (46.7 kg)    Health Maintenance Due  Topic Date Due   Zoster Vaccines- Shingrix (1 of 2) Never done   DEXA SCAN  Never done   Pneumonia Vaccine 81+ Years old (2 - PCV) 08/31/2019   COVID-19 Vaccine (3 - Pfizer risk series) 10/31/2019       Lab Results  Component Value Date   TSH 1.460 05/22/2020   Lab Results  Component Value Date   WBC 5.9 03/18/2021   HGB 11.7 03/18/2021   HCT 35.5 03/18/2021   MCV 99 (H) 03/18/2021   PLT 203 03/18/2021   Lab Results  Component Value Date   NA 143 03/18/2021   K 5.2 03/18/2021   CO2 26 03/18/2021   GLUCOSE 81 03/18/2021   BUN 19 03/18/2021   CREATININE 0.76 03/18/2021   BILITOT 0.4 03/18/2021   ALKPHOS 61 03/18/2021   AST 24 03/18/2021   ALT 15 03/18/2021   PROT 6.4 03/18/2021   ALBUMIN 3.9 03/18/2021   CALCIUM 8.6 (L) 03/18/2021   ANIONGAP 6 11/09/2018   EGFR 73 03/18/2021   Lab Results  Component Value Date   CHOL 112 03/18/2021   Lab Results  Component Value Date   HDL 46 03/18/2021   Lab Results  Component Value Date    LDLCALC 50 03/18/2021   Lab Results  Component Value Date   TRIG 78 03/18/2021   Lab Results  Component Value Date   CHOLHDL 2.4 03/18/2021   Lab Results  Component Value Date   HGBA1C 5.3 11/09/2018       Assessment & Plan:   1. Decreased urine output - CBC with Differential/Platelet - Comprehensive metabolic panel  2. Decreased oral intake - CBC with Differential/Platelet - Comprehensive metabolic panel   High fiber diet Push fluids, especially water  Return urine sample to office Follow-up as needed    Follow-up: PRN  An After Visit Summary was printed and given to the patient.  I, Rip Harbour, NP, have reviewed all documentation for this visit. The documentation on 07/05/21 for the exam, diagnosis, procedures, and orders are all accurate and complete.   Rip Harbour, NP [place] [phone]

## 2021-07-08 ENCOUNTER — Other Ambulatory Visit: Payer: Self-pay

## 2021-07-08 ENCOUNTER — Encounter: Payer: Self-pay | Admitting: Legal Medicine

## 2021-07-08 ENCOUNTER — Ambulatory Visit (INDEPENDENT_AMBULATORY_CARE_PROVIDER_SITE_OTHER): Payer: PPO

## 2021-07-08 DIAGNOSIS — R34 Anuria and oliguria: Secondary | ICD-10-CM

## 2021-07-08 LAB — POCT URINALYSIS DIPSTICK
Bilirubin, UA: NEGATIVE
Blood, UA: NEGATIVE
Glucose, UA: NEGATIVE
Ketones, UA: NEGATIVE
Leukocytes, UA: NEGATIVE
Nitrite, UA: NEGATIVE
Protein, UA: NEGATIVE
Spec Grav, UA: 1.015 (ref 1.010–1.025)
Urobilinogen, UA: 0.2 E.U./dL
pH, UA: 7 (ref 5.0–8.0)

## 2021-07-09 ENCOUNTER — Other Ambulatory Visit: Payer: Self-pay

## 2021-07-09 ENCOUNTER — Ambulatory Visit: Payer: PPO | Admitting: Sports Medicine

## 2021-07-09 DIAGNOSIS — M2040 Other hammer toe(s) (acquired), unspecified foot: Secondary | ICD-10-CM

## 2021-07-09 DIAGNOSIS — Z7901 Long term (current) use of anticoagulants: Secondary | ICD-10-CM

## 2021-07-09 DIAGNOSIS — L84 Corns and callosities: Secondary | ICD-10-CM | POA: Diagnosis not present

## 2021-07-09 DIAGNOSIS — M79674 Pain in right toe(s): Secondary | ICD-10-CM

## 2021-07-09 NOTE — Progress Notes (Signed)
Subjective: [name] is a 87 y.o. female patient who presents to office for evaluation of Right foot pain.  Patient reports that for the last year the right fifth toe has gotten very sore that she cut the call out of her shoe it no longer bothers her but states that the second toe is also starting to hurt some.  Patient is assisted by daughter this visit who helps to report history.  Patient Active Problem List   Diagnosis Date Noted   Bilateral impacted cerumen 04/02/2021   Memory loss    Gait abnormality 09/24/2020   Laceration of right lower leg 06/18/2020   Thoracic aorta atherosclerosis ([facility]) 05/22/2020   Stroke ([facility])    Hearing loss    Depression    Confusion    Alteration consciousness 03/08/2020   Cerebrovascular accident (CVA) ([facility]) 03/08/2020   Body mass index (BMI) less than 19 02/16/2020   Dementia, multiinfarct, with behavioral disturbance 01/18/2020   Chronic kidney disease, stage II (mild) 12/06/2019   Malnutrition of moderate degree ([facility]) 12/06/2019   Essential hypertension, benign 10/16/2019   Adjustment disorder with depressed mood 10/16/2019   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 10/16/2019   Apraxia following cerebrovascular accident 10/16/2019   B12 deficiency 10/16/2019   Acute ischemic stroke ([facility]) 11/08/2018   COPD (chronic obstructive pulmonary disease) ([facility]) 11/16/2017   PSVT (paroxysmal supraventricular tachycardia) ([facility]) 11/10/2017   Current use of long term anticoagulation 07/21/2017   Traumatic pneumothorax 06/30/2017   Closed fracture of one rib of right side with routine healing 06/30/2017   First degree atrioventricular block 06/26/2017   Hammer toe 06/26/2017   Hyperlipidemia 06/26/2017   Paroxysmal atrial fibrillation ([facility]) 06/26/2017   Premature ventricular contractions 06/26/2017   Premature atrial complex 06/26/2017   Palpitations 06/26/2017   Pain in limb 06/26/2017   Other specified cardiac arrhythmias 06/26/2017     Current Outpatient Medications on File Prior to Visit  Medication Sig Dispense Refill   acebutolol (SECTRAL) 200 MG capsule TAKE 1 CAPSULE(200 MG) BY MOUTH DAILY 90 capsule 3   apixaban (ELIQUIS) 2.5 MG TABS tablet TAKE 1 TABLET(2.5 MG) BY MOUTH TWICE DAILY 180 tablet 1   atorvastatin (LIPITOR) 40 MG tablet TAKE 1 TABLET(40 MG) BY MOUTH DAILY 90 tablet 2   Cholecalciferol (VITAMIN D3) 1000 units CAPS Take 1,000 Units by mouth daily.     divalproex (DEPAKOTE ER) 500 MG 24 hr tablet TAKE 1 TABLET(500 MG) BY MOUTH AT BEDTIME 30 tablet 1   donepezil (ARICEPT) 5 MG tablet Take 1 tablet ( 5 mg ) daily 30 tablet 11   melatonin 1 MG TABS tablet Take 1 mg by mouth at bedtime as needed (SLEEP).     METAMUCIL FIBER PO Take by mouth. As needed     mirtazapine (REMERON) 30 MG tablet TAKE 1 TABLET(30 MG) BY MOUTH AT BEDTIME 90 tablet 1   Multiple Vitamins-Minerals (PRESERVISION AREDS 2) CAPS Take by mouth.     OVER THE COUNTER MEDICATION Take 1 tablet by mouth daily. Selenex GSH supplement     prednisoLONE acetate (PRED FORTE) 1 % ophthalmic suspension      vitamin B-12 (CYANOCOBALAMIN) 500 MCG tablet Take 500 mcg by mouth daily.      No current facility-administered medications on file prior to visit.    Allergies  Allergen Reactions   Codeine Nausea And Vomiting   Penicillins Rash   Sulfa Antibiotics Nausea And Vomiting    Objective:  General: Alert and oriented x3 in no  acute distress  Dermatology: Keratotic lesion present right second and fifth toe dorsal aspect over the interphalangeal joint with skin lines transversing the lesion, pain is present with direct pressure to the lesion with a central nucleated core noted, no webspace macerations, no ecchymosis bilateral, all nails x 10 are well manicured.  Vascular: Dorsalis Pedis and Posterior Tibial pedal pulses 1/4, Capillary Fill Time 5 seconds, no pedal hair growth bilateral, no edema bilateral lower extremities, significant varicosities  bilateral, temperature gradient within normal limits.  Neurology: Johney Maine sensation intact via light touch bilateral.  Musculoskeletal: Mild tenderness with palpation at the keratotic lesion site on Right 5>2nd toes with significant rigid hammertoe contracture noted bilateral with the right fifth toe most involved.  Assessment and Plan: Problem List Items Addressed This Visit       Musculoskeletal and Integument   Hammer toe   Other Visit Diagnoses     Corn    -  Primary   Toe pain, right       Current use of anticoagulant therapy           -Complete examination performed -Discussed treatment options for painful corns with hammertoes -Parred keratoic lesion x2 at right second and fifth toes using a sterile 15 blade advised patient if she needs these corns trimmed again at next time we will have to sign an ABN -Encouraged use of Vaseline to the area 2-3 times weekly -Dispensed toe caps for patient to use when in shoes to prevent rubbing -Advised good supportive shoes that do not rub toes -Patient to return to office as needed or sooner if condition worsens.  Landis Martins, DPM

## 2021-07-10 NOTE — Telephone Encounter (Signed)
Dr Marina Goodell ordered IV fluids to be done at [facility] in [facility]. I faxed order and they contacted patient.

## 2021-07-11 DIAGNOSIS — E860 Dehydration: Secondary | ICD-10-CM | POA: Diagnosis not present

## 2021-07-15 ENCOUNTER — Ambulatory Visit: Payer: PPO | Admitting: Physician Assistant

## 2021-07-18 ENCOUNTER — Encounter: Payer: Self-pay | Admitting: Physician Assistant

## 2021-07-18 ENCOUNTER — Other Ambulatory Visit: Payer: Self-pay

## 2021-07-18 ENCOUNTER — Ambulatory Visit: Payer: PPO | Admitting: Physician Assistant

## 2021-07-18 VITALS — BP 129/72 | HR 69 | Resp 18 | Ht 63.0 in | Wt 105.0 lb

## 2021-07-18 DIAGNOSIS — F03918 Unspecified dementia, unspecified severity, with other behavioral disturbance: Secondary | ICD-10-CM
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
## 2021-08-01 ENCOUNTER — Telehealth: Payer: Self-pay

## 2021-08-01 NOTE — Telephone Encounter (Deleted)
I received  a letter in the mail today from patient's daughter [name]. She expressed concerns with discrepancies in her mothers MoCA scores as per the documentation on two different AVS'. In addition, the documentation referenced MMSE scores which also contradict each other was noted in the chart as being equivalent to the MoCA score. I asked Dr. Milbert Coulter to review the chart and advise based on the [facility] that are scanned in the chart. Dr. Milbert Coulter has agreed to see the patient and do a neuropsychological evaluation. I called patient's daughter today and spoke with [facility] and reviewed this plan of action and she verbalized understanding and agreed to continue with the neuropsych evaluation on her mother. Daughter stated "that this is what she is been trying to get for her mother". I informed her that I will have Jacqualyn Posey from our front desk reach out and schedule her an appointment however since we would be working her in on Dr. Tammy Sours schedule that the appointment would need to be on a Friday and [name] agreed and stated that this was fine and that she make sure her mother was here for the consult and testing.

## 2021-08-01 NOTE — Telephone Encounter (Signed)
I received  a letter in the mail today from patient's daughter [name]. She expressed concerns with discrepancies in her mothers MoCA scores as per the documentation on two different AVS'. In addition, the documentation referenced MMSE scores which also contradict each other was noted in the chart as being equivalent to the MoCA score. I asked Dr. Milbert Coulter to review the chart and advise based on the [facility] that are scanned in the chart. Dr. Milbert Coulter has agreed to see the patient and do a neuropsychological evaluation. I called patient's daughter today and spoke with [facility] and reviewed this plan of action and she verbalized understanding and agreed to continue with the neuropsych evaluation on her mother. Daughter stated "that this is what she is been trying to get for her mother". I informed her that I will have Jacqualyn Posey from our front desk reach out and schedule her an appointment however since we would be working her in on Dr. Tammy Sours schedule that the appointment would need to be on a Friday and [name] agreed and stated that this was fine and that she make sure her mother was here for the consult and testing.

## 2021-08-02 ENCOUNTER — Other Ambulatory Visit: Payer: Self-pay | Admitting: Legal Medicine

## 2021-08-02 DIAGNOSIS — E782 Mixed hyperlipidemia: Secondary | ICD-10-CM

## 2021-08-02 DIAGNOSIS — F01518 Vascular dementia, unspecified severity, with other behavioral disturbance: Secondary | ICD-10-CM

## 2021-08-05 ENCOUNTER — Encounter: Payer: Self-pay | Admitting: Legal Medicine

## 2021-08-08 ENCOUNTER — Telehealth: Payer: Self-pay | Admitting: Sports Medicine

## 2021-08-08 NOTE — Telephone Encounter (Signed)
My mother is a patient of Dr Marylene Land. The doctor removed two corns on her toes recently. She is now complaining that the longer toe is rubbing the top of her shoe causing pain even when wearing the gel toe protectors Dr Marylene Land gave her. She has dementia so I don't know what to think. Do we need to come in?

## 2021-08-08 NOTE — Telephone Encounter (Signed)
Please schedule for a f/u appointment

## 2021-08-10 ENCOUNTER — Encounter: Payer: Self-pay | Admitting: Legal Medicine

## 2021-08-12 ENCOUNTER — Telehealth: Payer: Self-pay

## 2021-08-12 NOTE — Telephone Encounter (Signed)
Patient's daughter said she would like for [place] to change the patient's medications to liquids if we can and that will help with the patient's swallowing.

## 2021-08-12 NOTE — Telephone Encounter (Signed)
Patient has presbyesophagus and has seen Dr. Charm Barges, neurology and ENT, dementia may be contributing.  She has no blockage but the muscle of esophagus are not contracting in a coordinated way, she was seen by several specialists in 2021.  Best to try to change all her medicines to liquid if OK with you.  Applesauce is still a good idea to assist swallowing.  We can refer again if you wish. Dr. Marina Goodell

## 2021-08-13 ENCOUNTER — Other Ambulatory Visit: Payer: Self-pay | Admitting: Legal Medicine

## 2021-08-13 ENCOUNTER — Ambulatory Visit: Payer: PPO | Admitting: Sports Medicine

## 2021-08-13 ENCOUNTER — Encounter: Payer: Self-pay | Admitting: Sports Medicine

## 2021-08-13 ENCOUNTER — Ambulatory Visit: Payer: PPO | Admitting: Podiatry

## 2021-08-13 DIAGNOSIS — M2040 Other hammer toe(s) (acquired), unspecified foot: Secondary | ICD-10-CM | POA: Diagnosis not present

## 2021-08-13 DIAGNOSIS — L84 Corns and callosities: Secondary | ICD-10-CM | POA: Diagnosis not present

## 2021-08-13 DIAGNOSIS — E782 Mixed hyperlipidemia: Secondary | ICD-10-CM

## 2021-08-13 DIAGNOSIS — Z7901 Long term (current) use of anticoagulants: Secondary | ICD-10-CM

## 2021-08-13 DIAGNOSIS — M79674 Pain in right toe(s): Secondary | ICD-10-CM | POA: Diagnosis not present

## 2021-08-13 MED ORDER — ACEBUTOLOL HCL 200 MG PO CAPS: 200.0000 mg | ORAL_CAPSULE | Freq: Every day | ORAL | 3 refills | Status: DC

## 2021-08-13 NOTE — Telephone Encounter (Signed)
I will have t review medicines, some do not come in liquid, we may need to stop some of them, give me some time lp

## 2021-08-13 NOTE — Progress Notes (Signed)
Subjective: [name] is a 87 y.o. female patient who presents to office for evaluation of Right foot pain secondary to corns and toes rubbing on the right.  Patient reports that her toes still bother her and has cut a hole in her shoe to accommodate for this.  Patient is assisted by daughter who believes that her mother shoes are too small.  Patient Active Problem List   Diagnosis Date Noted   Bilateral impacted cerumen 04/02/2021   Memory loss    Gait abnormality 09/24/2020   Laceration of right lower leg 06/18/2020   Thoracic aorta atherosclerosis (HCC) 05/22/2020   Stroke (HCC)    Hearing loss    Depression    Confusion    Alteration consciousness 03/08/2020   Cerebrovascular accident (CVA) (HCC) 03/08/2020   Body mass index (BMI) less than 19 02/16/2020   Dementia, multiinfarct, with behavioral disturbance 01/18/2020   Chronic kidney disease, stage II (mild) 12/06/2019   Malnutrition of moderate degree (HCC) 12/06/2019   Essential hypertension, benign 10/16/2019   Adjustment disorder with depressed mood 10/16/2019   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 10/16/2019   Apraxia following cerebrovascular accident 10/16/2019   B12 deficiency 10/16/2019   Acute ischemic stroke (HCC) 11/08/2018   COPD (chronic obstructive pulmonary disease) (HCC) 11/16/2017   PSVT (paroxysmal supraventricular tachycardia) (HCC) 11/10/2017   Current use of long term anticoagulation 07/21/2017   Traumatic pneumothorax 06/30/2017   Closed fracture of one rib of right side with routine healing 06/30/2017   First degree atrioventricular block 06/26/2017   Hammer toe 06/26/2017   Hyperlipidemia 06/26/2017   Paroxysmal atrial fibrillation (HCC) 06/26/2017   Premature ventricular contractions 06/26/2017   Premature atrial complex 06/26/2017   Palpitations 06/26/2017   Pain in limb 06/26/2017   Other specified cardiac arrhythmias 06/26/2017    Current Outpatient Medications on File Prior  to Visit  Medication Sig Dispense Refill   acebutolol (SECTRAL) 200 MG capsule TAKE 1 CAPSULE(200 MG) BY MOUTH DAILY 90 capsule 3   apixaban (ELIQUIS) 2.5 MG TABS tablet TAKE 1 TABLET(2.5 MG) BY MOUTH TWICE DAILY 180 tablet 1   atorvastatin (LIPITOR) 40 MG tablet TAKE 1 TABLET(40 MG) BY MOUTH DAILY 90 tablet 2   Cholecalciferol (VITAMIN D3) 1000 units CAPS Take 1,000 Units by mouth daily.     divalproex (DEPAKOTE ER) 500 MG 24 hr tablet TAKE 1 TABLET(500 MG) BY MOUTH AT BEDTIME 30 tablet 1   donepezil (ARICEPT) 5 MG tablet Take 1 tablet ( 5 mg ) daily (Patient taking differently: 10 mg. Take 1 tablet po qd) 30 tablet 11   melatonin 1 MG TABS tablet Take 1 mg by mouth at bedtime as needed (SLEEP).     METAMUCIL FIBER PO Take by mouth. As needed     mirtazapine (REMERON) 30 MG tablet TAKE 1 TABLET(30 MG) BY MOUTH AT BEDTIME 90 tablet 1   Multiple Vitamins-Minerals (PRESERVISION AREDS 2) CAPS Take by mouth.     OVER THE COUNTER MEDICATION Take 1 tablet by mouth daily. Selenex GSH supplement     prednisoLONE acetate (PRED FORTE) 1 % ophthalmic suspension      vitamin B-12 (CYANOCOBALAMIN) 500 MCG tablet Take 500 mcg by mouth daily.      No current facility-administered medications on file prior to visit.    Allergies  Allergen Reactions   Codeine Nausea And Vomiting   Penicillins Rash   Sulfa Antibiotics Nausea And Vomiting    Objective:  General: Alert and oriented x3 in no  acute distress  Dermatology: Keratotic lesion present right second and fifth toe dorsal aspect over the interphalangeal joint with skin lines transversing the lesion, pain is present with direct pressure to the lesion with a central nucleated core noted, no webspace macerations, no ecchymosis bilateral, all nails x 10 are well manicured.  Vascular: Dorsalis Pedis and Posterior Tibial pedal pulses 1/4, Capillary Fill Time 5 seconds, no pedal hair growth bilateral, no edema bilateral lower extremities, significant  varicosities bilateral, temperature gradient within normal limits.  Neurology: Michaell Cowing sensation intact via light touch bilateral.  Musculoskeletal: Mild tenderness with palpation at the keratotic lesion site on Right 5>2nd toes with significant rigid hammertoe contracture noted bilateral with the right fifth toe most involved.  Assessment and Plan: Problem List Items Addressed This Visit       Musculoskeletal and Integument   Hammer toe   Other Visit Diagnoses     Corn    -  Primary   Toe pain, right       Current use of anticoagulant therapy           -Complete examination performed -Re-Discussed treatment options for painful corns with hammertoes -Parred keratoic lesion x2 at right second and fifth toes using a sterile 15 blade advised patient if she needs these corns trimmed again at next time we will have to charge for them however at this time did it for a courtesy -Dispensed new toe spacers -Encouraged use of Vaseline to the area 2-3 times weekly -Recommend good supportive shoes that are wide and to go up 1 full size since current shoes are way too small -Patient to return to office as needed or sooner if condition worsens.  Asencion Islam, DPM

## 2021-08-13 NOTE — Telephone Encounter (Signed)
Spoke w/ [name]. As of now they are not wanting to change medications. Pt is in donut hole. They do not want to buy new supply of medication until it is needed. [name] worked with pt this morning on taking medication with applesauce and believes she was doing this incorrectly and this morning this worked well. Pt has neurology appointment coming up and they will speak on this at that visit as well. FYI.   Terrill Mohr 08/13/21 3:57 PM

## 2021-08-16 ENCOUNTER — Ambulatory Visit: Payer: PPO | Admitting: Psychology

## 2021-08-16 ENCOUNTER — Other Ambulatory Visit: Payer: Self-pay

## 2021-08-16 ENCOUNTER — Encounter: Payer: Self-pay | Admitting: Psychology

## 2021-08-16 DIAGNOSIS — R4189 Other symptoms and signs involving cognitive functions and awareness: Secondary | ICD-10-CM

## 2021-08-16 DIAGNOSIS — G309 Alzheimer's disease, unspecified: Secondary | ICD-10-CM | POA: Diagnosis not present

## 2021-08-16 DIAGNOSIS — F0280 Dementia in other diseases classified elsewhere without behavioral disturbance: Secondary | ICD-10-CM | POA: Diagnosis not present

## 2021-08-16 DIAGNOSIS — I639 Cerebral infarction, unspecified: Secondary | ICD-10-CM

## 2021-08-16 HISTORY — DX: Dementia in other diseases classified elsewhere, unspecified severity, without behavioral disturbance, psychotic disturbance, mood disturbance, and anxiety: F02.80

## 2021-08-16 NOTE — Progress Notes (Addendum)
NEUROPSYCHOLOGICAL EVALUATION [place] Department of Neurology  Date of Evaluation: August 16, 2021  Reason for Referral:   [name] is a 87 y.o. right-handed Caucasian female referred by  Sharene Butters, PA-C , to characterize her current cognitive functioning and assist with diagnostic clarity and treatment planning in the context of subjective cognitive decline and a prior stroke history.   Assessment and Plan:   Clinical Impression(s): Ms. [name]' pattern of performance is suggestive of primary impairments surrounding processing speed, cognitive flexibility, confrontation naming, visuospatial abilities, and delayed retrieval aspects of memory. There was some variability when learning information initially (i.e., encoding aspects of memory). Performance was generally appropriate across attention/concentration, verbal fluency, and receptive language. While Ms. [name] denied any ADL dysfunction, I do not believe that she has proper insight into the extent of current cognitive limitations. Her daughter reported that she manages her mother's medications and personal finances and also reported that it has been advised that [name] no longer drive. [name] also has sitters with her in the mornings and evenings to assist with ADLs. Given ongoing ADL dysfunction and evidence for objective cognitive impairment in several domains, [name] meets diagnostic criteria for a Major Neurocognitive Disorder ("dementia") at the present time.  The etiology for ongoing impairment is somewhat less clear. However, consideration needs to be given to a late-onset Alzheimer's disease presentation as the most likely cause. Across memory testing, [name] was fully amnestic ("I don't remember anything") when asked about a previously read story after a brief delay. While normatively, her performance recalling visual information scored in the average range, there are  noteworthy limitations to this task. Namely a raw score of 1 ([name]' score) can be easily obtained by guessing given this task's forced choice format and yields a score in the average range given her age. The single item she identified correctly at a delay was not an item she demonstrated learning during learning trials. Thus, this single correct response seems fortuitous guessing rather than a demonstration of intact delayed retrieval. Unfortunately, fatigue levels causing the abbreviation of the testing session did not allow for further probing in this area. Deficits across confrontation naming, cognitive flexibility, and visuospatial abilities are consistent with typical disease in progression in Alzheimer's disease, thus increasing overall concerns.   There is certainly the possibility for a vascular contribution (i.e., mixed dementia presentation) given prior neuroimaging suggesting a chronic left frontal infarct and moderate small vessel disease. Deficits in processing speed, cognitive flexibility, and memory would align with this etiology. [name] does not exhibit behavioral features concerning for Lewy body dementia, frontotemporal dementia, or other more rare parkinsonian conditions at the present time. Continued medical monitoring will be important moving forward.   Recommendations: [name] has already been prescribed a medication aimed to address memory loss and concerns surrounding Alzheimer's disease (i.e., donepezil/Aricept). She is encouraged to continue taking this medication as prescribed. It is important to highlight that this medication has been shown to slow functional decline in some individuals. There is no current treatment which can stop or reverse cognitive decline when caused by a neurodegenerative illness.   I agree with prior unspecified clinicians who have advised her against driving. Should her family wish to pursue a formalized driving evaluation, they could reach  out to the following agencies: [employer] in [place]: [phone] [employer]habil[employer]: [facility]: [place]: [phone] or [phone]  It will be important for  [name] to have another person with her when in situations where she may need to process information, weigh the pros and cons of different options, and make decisions, in order to ensure that she fully understands and recalls all information to be considered.  If not already done, [name] and her family may want to discuss her wishes regarding durable power of attorney and medical decision making, so that she can have input into these choices. Additionally, they may wish to discuss future plans for caretaking and seek out community options for residential care should they become necessary or current in-home supervision levels become too difficult to maintain.  [name] is encouraged to attend to lifestyle factors for brain health (e.g., regular physical exercise, good nutrition habits, regular participation in cognitively-stimulating activities, and general stress management techniques), which are likely to have benefits for both emotional adjustment and cognition. Optimal control of vascular risk factors (including safe cardiovascular exercise and adherence to dietary recommendations) is encouraged. Continued participation in activities which provide mental stimulation and social interaction is also recommended.   Important information should be provided to [name] in written format in all instances. This information should be placed in a highly frequented and easily visible location within her home to promote recall. External strategies such as written notes in a consistently used memory journal, visual and nonverbal auditory cues such as a calendar on the refrigerator or appointments with alarm, such as on a cell phone, can also help maximize  recall.  To address problems with processing speed, she may wish to consider:   -Ensuring that she is alerted when essential material or instructions are being presented   -Adjusting the speed at which new information is presented   -Allowing for more time in comprehending, processing, and responding in conversation  To address problems with fluctuating attention, she may wish to consider:   -Avoiding external distractions when needing to concentrate   -Limiting exposure to fast paced environments with multiple sensory demands   -Writing down complicated information and using checklists   -Attempting and completing one task at a time (i.e., no multi-tasking)   -Verbalizing aloud each step of a task to maintain focus   -Reducing the amount of information considered at one time  Review of Records:   Ms. Javorsky was seen by [facility]rology Sharene Butters, PA-C) on 04/10/2021 for an evaluation of memory loss. Initially, memory loss was reportedly noticed by friends and family where she was having difficulty speaking, understanding, and expressing her thoughts. There was trouble recalling names and details of past events and she reportedly began to forget phone numbers or how to unlock her door. She began to show altered behavior, such as overreacting to bad weather (i.e., "she describes it as the worst she is ever experienced in her life"). She would get confused with antibiotics, especially those that look like Remeron, and likely duplicated doses of many of these medications. Her daughter expressed frustration surrounding her mother's behavior, especially as she has refused to move to a memory care or assisted living facility. Ms. Mucci reportedly has said "the [facility] might as well take me if I have to go to a nursing home." Her daughter has taken over medication and financial management and Ms. Crumbliss has sitters throughout the day who assist her and provide her company as she continues to otherwise  live alone. She reported sleeping well and denied vivid dreams or REM sleep behaviors. She also denied ongoing hallucinations or paranoia. Her appetite  is decreased; part of this may be due to ongoing swallowing difficulties. She will ambulate with a cane; although her gait is not very stable according to her daughter and she likely should be using the cane or a walker more often. There has been report of several falls in the past with [name] denying medical care/assistance during each instance. She denied ongoing double vision, dizziness, focal numbness or tingling, urine incontinence or retention, constipation or diarrhea, anosmia, history of OSA, or a history of alcohol/tobacco abuse. Performance on a brief cognitive screening instrument (MOCA) was 19/30.  She was most recently seen by Ms. Wertman on 07/18/2021 for follow-up. At that time, Ms. Botero described her memory as "the same." She continues to have difficulty remembering names or events and will repeat questions and sentences. She also reportedly continues to experience some behavioral issues per her daughter.  Brain MRI on 06/08/2018 revealed chronic small vessel disease of unspecified severity. Brain MRI on 11/08/2018 revealed a small focus of acute stroke at the left postcentral gyri, generalized atrophy, and chronic ischemic microangiopathy. Brain MRI on 01/06/2020 reportedly revealed evidence of left frontal cortical stroke, moderate generalized atrophy, and moderate small vessel ischemic changes. Brain MRI on 04/26/2021 revealed further progression of atrophy and small vessel ischemia relative to her 2021 MRI.   Ultimately, Ms. Holycross was referred for a comprehensive neuropsychological evaluation to characterize her cognitive abilities and to assist with diagnostic clarity and treatment planning.   Past Medical History:  Diagnosis Date   Acute ischemic stroke 11/08/2018   left postcentral gyri; left frontal   Adjustment disorder with  depressed mood 10/16/2019   Alteration consciousness 03/08/2020   Apraxia following cerebrovascular accident 10/16/2019   B12 deficiency 10/16/2019   Bilateral impacted cerumen 04/02/2021   Body mass index (BMI) less than 19 02/16/2020   Chronic kidney disease, stage II (mild) 12/06/2019   Closed fracture of one rib of right side with routine healing 06/30/2017   Confusion    COPD (chronic obstructive pulmonary disease) 11/16/2017   Current use of long term anticoagulation 07/21/2017   Essential hypertension, benign 10/16/2019   First degree atrioventricular block 06/26/2017   Gait abnormality 09/24/2020   Hammer toe 06/26/2017   Hearing loss    Hyperlipidemia 06/26/2017   Laceration of right lower leg 06/18/2020   Malnutrition of moderate degree 12/06/2019   Memory loss    Other specified cardiac arrhythmias 06/26/2017   Pain in limb 06/26/2017   Palpitations 06/26/2017   Paroxysmal atrial fibrillation 06/26/2017   remote history post operatively, CHADS 2 score=2   Premature atrial complex 06/26/2017   Premature ventricular contractions 06/26/2017   PSVT (paroxysmal supraventricular tachycardia) 11/10/2017   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 Open capsule and give with apple sauce, Disp: 90 capsule, Rfl: 3   apixaban (ELIQUIS) 2.5 MG TABS tablet, TAKE 1 TABLET(2.5 MG) BY MOUTH TWICE DAILY, Disp: 180 tablet, Rfl: 1   atorvastatin (LIPITOR) 40 MG tablet, TAKE 1 TABLET(40 MG) BY MOUTH DAILY, Disp: 90 tablet, Rfl: 2   Cholecalciferol (VITAMIN D3) 1000 units CAPS, Take 1,000 Units by mouth daily., Disp: , Rfl:  divalproex (DEPAKOTE ER) 500  MG 24 hr tablet, TAKE 1 TABLET(500 MG) BY MOUTH AT BEDTIME, Disp: 30 tablet, Rfl: 1   donepezil (ARICEPT) 5 MG tablet, Take 1 tablet ( 5 mg ) daily (Patient taking differently: 10 mg. Take 1 tablet po qd), Disp: 30 tablet, Rfl: 11   melatonin 1 MG TABS tablet, Take 1 mg by mouth at bedtime as needed (SLEEP)., Disp: , Rfl:    METAMUCIL FIBER PO, Take by mouth. As needed, Disp: , Rfl:    mirtazapine (REMERON) 30 MG tablet, TAKE 1 TABLET(30 MG) BY MOUTH AT BEDTIME, Disp: 90 tablet, Rfl: 1   Multiple Vitamins-Minerals (PRESERVISION AREDS 2) CAPS, Take by mouth., Disp: , Rfl:    OVER THE COUNTER MEDICATION, Take 1 tablet by mouth daily. Selenex GSH supplement, Disp: , Rfl:    prednisoLONE acetate (PRED FORTE) 1 % ophthalmic suspension, , Disp: , Rfl:    vitamin B-12 (CYANOCOBALAMIN) 500 MCG tablet, Take 500 mcg by mouth daily. , Disp: , Rfl:   Clinical Interview:   The following information was obtained during a clinical interview with Ms. Solana and her daughter prior to cognitive testing.  Cognitive Symptoms: Decreased short-term memory: Denied. When asked about memory concerns, she responded with "not anything unusual," stating that she had not noticed any cognitive decline ("not a whole lot"). Her daughter reported significant concerns surrounding memory, stating that there has been progressive decline at least during the past two years.  Decreased long-term memory: Denied. Decreased attention/concentration: Denied. Her daughter noted that Ms. Tondreau has always had a shorter attention span but that this seems much worse lately.  Reduced processing speed: Denied. Difficulties with executive functions: Denied. Difficulties with emotion regulation: Denied. Difficulties with receptive language: Denied. Difficulties with word finding: Endorsed "once in a while."  Decreased visuoperceptual ability: Denied.  Difficulties completing ADLs: Endorsed. Ms. Vicencio denied any ADL dysfunction when asked  directly. However, her daughter manages medications, finances, and bill paying responsibilities. Ms. Lebron also has sitters with her for several hours both in the morning and evening. They provide additional assistance with ADL management and functioning. Ms. Cousineau has not driven since her stroke, stating that her physician instructed her not to. She did not appear to agree with this recommendation, expressing her belief that she could drive appropriately if given the chance.   Additional Medical History: History of traumatic brain injury/concussion: Unclear. Ms. Spaugh denied any head injuries. Her daughter noted that she has fallen numerous times, including several instances where she directly hit her head on concrete. She was unsure if any of these caused a brief loss in consciousness. Ms. Poirier was somewhat adamant that none of this was accurate.  History of stroke: Endorsed (see above).  History of seizure activity: Denied. History of known exposure to toxins: Denied. Symptoms of chronic pain: Denied. However, medical records do suggest prior areas of pain/discomfort. However, based upon her reporting, current pain levels do not appear debilitating.  Experience of frequent headaches/migraines: Denied. Frequent instances of dizziness/vertigo: Denied.  Sensory changes: She wears glasses and utilizes hearing aids with benefit. Other sensory changes/difficulties (e.g., taste or smell) were denied.  Balance/coordination difficulties: Denied. Ms. Leece stated that "most of the time, I feel fine." As stated above, her daughter reported a history of numerous falls and stated that Ms. Corkern will commonly "teeter-totter" but can generally stabilize herself with some assistance. She has utilized a cane in the past.  Other motor difficulties: Denied.  Sleep History: Estimated hours obtained  each night: 6-8 hours.  Difficulties falling asleep: Endorsed. This was said to occur occasionally and is  generally managed with melatonin. Her daughter monitors these pills and noted that Ms. Brawn has not needed to use them lately, suggesting improved sleep. Ms. Azoulay will nap during the day.  Difficulties staying asleep: Denied. Feels rested and refreshed upon awakening: Endorsed.  History of snoring: Denied. History of waking up gasping for air: Denied. Witnessed breath cessation while asleep: Denied.  History of vivid dreaming: Denied. Excessive movement while asleep: Denied. Instances of acting out her dreams: Denied.  Psychiatric/Behavioral Health History: Depression: Ms. Demario described her current mood as "good" and denied to her knowledge any prior mental health concerns or diagnoses. He daughter stated that Ms. Roske likely suffers from seasonal affective disorder and takes mood-related medications regularly which are helpful at managing symptoms. Ms. Husic appeared surprised when hearing that she took these medications. Current or remote suicidal ideation, intent, or plan was denied.  Anxiety: Denied. Mania: Denied. Trauma History: Denied. Visual/auditory hallucinations: Denied. Delusional thoughts: Denied.  Tobacco: Denied. Alcohol: She denied current alcohol consumption as well as a history of problematic alcohol abuse or dependence.  Recreational drugs: Denied.  Family History: Problem Relation Age of Onset   Bone cancer Mother    Breast cancer Father    Diabetes Father    Heart disease Father    Dementia Sister    Breast cancer Sister    Alzheimer's disease Sister    Dementia Brother    This information was confirmed by [name].  Academic/Vocational History: Highest level of educational attainment: 12 years. She graduated from high school and described herself as a good (A/B) student in academic settings. No relative weaknesses were reported.  History of developmental delay: Denied. History of grade repetition: Denied. Enrollment in special education  courses: Denied. History of LD/ADHD: Denied.  Employment: Retired. She previously worked in a Engineer, maintenance, in a [employer], and in Musician.   Evaluation Results:   Behavioral Observations: Ms. Scorsone was accompanied by her daughter, arrived to her appointment on time, and was appropriately dressed and groomed. She appeared alert and oriented. She ambulated quite slowly and had a degree of support from her daughter. She did not ambulate with a cane or other assistive device. Gross motor functioning appeared intact upon informal observation and no abnormal movements (e.g., tremors) were noted. Her affect was generally relaxed and positive. Spontaneous speech was fluent and word finding difficulties were not observed during the clinical interview. Thought processes were coherent, organized, and normal in content. Insight into her cognitive difficulties appeared quite limited. Her daughter commonly contradicted her reporting and there were times where Ms. Aguino genuinely seemed surprised at certain things in her medical history. Despite her denying all cognitive concerns, objective testing revealed notable impairment across several domains.   During testing, Ms. Porath was very hard of hearing. An amplification device (Pocket Talker) was utilized in response. This alleviated difficulties to an extent; however, hearing loss remained present during testing. She fatigued quickly during the testing process, telling the psychometrist that she was "completely worn out." The evaluation had to be abbreviated in response to this. During tests that were completed, sustained attention was appropriate. Task engagement was adequate until fatigue became overwhelming and she generally persisted when challenged up until that point. Overall, Ms. Cerami was cooperative with the clinical interview and subsequent testing procedures.   Adequacy of Effort: The validity of neuropsychological testing is limited by  the  extent to which the individual being tested may be assumed to have exerted adequate effort during testing. Ms. Brittingham expressed her intention to perform to the best of her abilities and exhibited adequate task engagement and persistence. Performance across a stand-alone performance validity measures was within expectation. As such, the results of the current evaluation are believed to be a valid representation of Ms. Ghuman' current cognitive functioning.  Test Results: Ms. Dynes was largely oriented at the time of the current evaluation. Points were lost for her being over an hour off when estimating the current time, as well as her being unable to provide the specific name of the current clinic.   Intellectual abilities based upon educational and vocational attainment were estimated to be in the average range. Premorbid abilities were estimated to be within the average range based upon a single-word reading test.   Processing speed was well below average. Basic attention was below average. More complex attention (e.g., working memory) was also below average. Executive functioning was variable. While she was able to navigate mazes and provide words rapidly to a specific cue well, she exhibited significant impairments across cognitive flexibility. Limited testing tolerance prevented a more thorough assessment in this domain.   Assessed receptive language abilities were average. Likewise, Ms. Pheng did not exhibit any difficulties comprehending task instructions. Assessed expressive language was somewhat variable. Verbal fluency (both phonemic and semantic) were below average while confrontation naming was well below average.     Assessed visuospatial/visuoconstructional abilities were well below average overall. Points were lost on her drawing of a clock mild spatial errors in numerical placement, as well as incorrect hand placement.    Learning (i.e., encoding) of novel verbal and visual  information was variable, ranging from the well below average to average normative ranges. Spontaneous delayed recall (i.e., retrieval) of previously learned information commensurate with performances across initial learning trials. Retention rates were 0% across a story learning task ("I don't remember a thing") and 20% across a shape learning task.   Results of emotional screening instruments suggested that recent symptoms of generalized anxiety were in the minimal range, while symptoms of depression were within normal limits. A screening instrument assessing recent sleep quality suggested the presence of minimal sleep dysfunction.  Tables of Scores:   Note: This summary of test scores accompanies the interpretive report and should not be considered in isolation without reference to the appropriate sections in the text. Descriptors are based on appropriate normative data and may be adjusted based on clinical judgment. Terms such as "Within Normal Limits" and "Outside Normal Limits" are used when a more specific description of the test score cannot be determined.       Percentile - Normative Descriptor > 98 - Exceptionally High 91-97 - Well Above Average 75-90 - Above Average 25-74 - Average 9-24 - Below Average 2-8 - Well Below Average < 2 - Exceptionally Low       Validity:   DESCRIPTOR       Dot Counting Test: --- --- Within Normal Limits       Cognitive Screening:      Raw Score Percentile   SLUMS: 9/30 --- ---       NAB Screening Battery, Form 1 Standard Score/ T Score Percentile   Total Score --- --- ---    Orientation 27/29 --- ---  Attention Domain --- --- ---    Digits Forward 39 14 Below Average    Digits Backwards 41 18 Below Average  Letters & Numbers A Efficiency 62 5 Well Below Average    Letters & Numbers B Efficiency Discontinued --- Impaired  Language Domain 84 14 Below Average    Auditory Comprehension 55 69 Average    Naming 30 2 Well Below Average  Memory  Domain 53 3 Well Below Average    Shape Learning Immediate Recognition 44 27 Average    Story Learning Immediate Recall 31 3 Well Below Average    Shape Learning Delayed Recognition 44 27 Average    Story Learning Delayed Recall 25 1 Exceptionally Low  Spatial Domain 68 2 Well Below Average    Visual Discrimination 29 2 Well Below Average    Design Construction 32 4 Well Below Average  Executive Functions Domain 110 75 Above Average    Mazes 63 91 Well Above Average    Word Generation 48 42 Average       Intellectual Functioning:      Standard Score Percentile   Test of Premorbid Functioning: 99 47 Average       Attention/Executive Function:     Trail Making Test (TMT): Raw Score (Scaled Score) Percentile     Part A 98 secs.,  0 errors (4) 2 Well Below Average    Part B Discontinued --- Impaired  *Based on [employer] Older Normative Studies (MOANS)          Language:     Verbal Fluency Test: Raw Score (Scaled Score) Percentile     Phonemic Fluency (CFL) 16 (6) 9 Below Average    Category Fluency 18 (6) 9 Below Average  *Based on [employer] Older Normative Studies (MOANS)          Visuospatial/Visuoconstruction:      Raw Score Percentile   Clock Drawing: 7/10 --- Within Normal Limits       Mood and Personality:      Raw Score Percentile   PROMIS Depression Questionnaire: 8 --- None to Slight  PROMIS Anxiety Questionnaire: 7 --- None to Slight       Additional Questionnaires:      Raw Score Percentile   PROMIS Sleep Disturbance Questionnaire: 9 --- None to Slight   Informed Consent and Coding/Compliance:   The current evaluation represents a clinical evaluation for the purposes previously outlined by the referral source and is in no way reflective of a forensic evaluation.   Ms. Schopf was provided with a verbal description of the nature and purpose of the present neuropsychological evaluation. Also reviewed were the foreseeable risks and/or discomforts and benefits of the  procedure, limits of confidentiality, and mandatory reporting requirements of this provider. The patient was given the opportunity to ask questions and receive answers about the evaluation. Oral consent to participate was provided by the patient.   This evaluation was conducted by Christia Reading, Ph.D., ABPP-CN, board certified clinical neuropsychologist. Ms. Welson completed a clinical interview with Dr. Melvyn Novas, billed as one unit [phone], and 80 minutes of cognitive testing and scoring, billed as one unit [phone] and two additional units [id number]. Psychometrist Milana Kidney, B.S., assisted Dr. Melvyn Novas with test administration and scoring procedures. As a separate and discrete service, Dr. Melvyn Novas spent a total of 160 minutes in interpretation and report writing billed as one unit [phone] and two units 96133.

## 2021-08-16 NOTE — Progress Notes (Signed)
° °  Psychometrician Note   Cognitive testing was administered to [name] by Wallace Keller, B.S. (psychometrist) under the supervision of Dr. Newman Nickels, Ph.D., licensed psychologist on 08/16/21. [name] did not appear overtly distressed by the testing session per behavioral observation or responses across self-report questionnaires. Rest breaks were offered.    The battery of tests administered was selected by Dr. Newman Nickels, Ph.D. with consideration to [name]'s current level of functioning, the nature of her symptoms, emotional and behavioral responses during interview, level of literacy, observed level of motivation/effort, and the nature of the referral question. This battery was communicated to the psychometrist. Communication between Dr. Newman Nickels, Ph.D. and the psychometrist was ongoing throughout the evaluation and Dr. Newman Nickels, Ph.D. was immediately accessible at all times. Dr. Newman Nickels, Ph.D. provided supervision to the psychometrist on the date of this service to the extent necessary to assure the quality of all services provided.    [name] will return within approximately 1-2 weeks for an interactive feedback session with Dr. Milbert Coulter at which time her test performances, clinical impressions, and treatment recommendations will be reviewed in detail. [name] understands she can contact our office should she require our assistance before this time.  A total of 80 minutes of billable time were spent face-to-face with [name] by the psychometrist. This includes both test administration and scoring time. Billing for these services is reflected in the clinical report generated by Dr. Newman Nickels, Ph.D.  This note reflects time spent with the psychometrician and does not include test scores or any clinical interpretations made by Dr. Milbert Coulter. The full report will follow in a separate note.

## 2021-08-19 ENCOUNTER — Encounter: Payer: Self-pay | Admitting: Psychology

## 2021-08-20 DIAGNOSIS — M810 Age-related osteoporosis without current pathological fracture: Secondary | ICD-10-CM | POA: Diagnosis not present

## 2021-08-20 DIAGNOSIS — S329XXA Fracture of unspecified parts of lumbosacral spine and pelvis, initial encounter for closed fracture: Secondary | ICD-10-CM | POA: Diagnosis not present

## 2021-08-20 DIAGNOSIS — W19XXXA Unspecified fall, initial encounter: Secondary | ICD-10-CM | POA: Diagnosis not present

## 2021-08-20 DIAGNOSIS — Z882 Allergy status to sulfonamides status: Secondary | ICD-10-CM | POA: Diagnosis not present

## 2021-08-20 DIAGNOSIS — W19XXXD Unspecified fall, subsequent encounter: Secondary | ICD-10-CM | POA: Diagnosis not present

## 2021-08-20 DIAGNOSIS — R2681 Unsteadiness on feet: Secondary | ICD-10-CM | POA: Diagnosis not present

## 2021-08-20 DIAGNOSIS — Z880 Allergy status to penicillin: Secondary | ICD-10-CM | POA: Diagnosis not present

## 2021-08-20 DIAGNOSIS — S32512D Fracture of superior rim of left pubis, subsequent encounter for fracture with routine healing: Secondary | ICD-10-CM | POA: Diagnosis not present

## 2021-08-20 DIAGNOSIS — D519 Vitamin B12 deficiency anemia, unspecified: Secondary | ICD-10-CM | POA: Diagnosis not present

## 2021-08-20 DIAGNOSIS — J439 Emphysema, unspecified: Secondary | ICD-10-CM | POA: Diagnosis not present

## 2021-08-20 DIAGNOSIS — Z743 Need for continuous supervision: Secondary | ICD-10-CM | POA: Diagnosis not present

## 2021-08-20 DIAGNOSIS — F339 Major depressive disorder, recurrent, unspecified: Secondary | ICD-10-CM | POA: Diagnosis not present

## 2021-08-20 DIAGNOSIS — S32592D Other specified fracture of left pubis, subsequent encounter for fracture with routine healing: Secondary | ICD-10-CM | POA: Diagnosis not present

## 2021-08-20 DIAGNOSIS — Z79899 Other long term (current) drug therapy: Secondary | ICD-10-CM | POA: Diagnosis not present

## 2021-08-20 DIAGNOSIS — M19049 Primary osteoarthritis, unspecified hand: Secondary | ICD-10-CM | POA: Diagnosis not present

## 2021-08-20 DIAGNOSIS — Z7901 Long term (current) use of anticoagulants: Secondary | ICD-10-CM | POA: Diagnosis not present

## 2021-08-20 DIAGNOSIS — R279 Unspecified lack of coordination: Secondary | ICD-10-CM | POA: Diagnosis not present

## 2021-08-20 DIAGNOSIS — J449 Chronic obstructive pulmonary disease, unspecified: Secondary | ICD-10-CM | POA: Diagnosis not present

## 2021-08-20 DIAGNOSIS — M6281 Muscle weakness (generalized): Secondary | ICD-10-CM | POA: Diagnosis not present

## 2021-08-20 DIAGNOSIS — Z8673 Personal history of transient ischemic attack (TIA), and cerebral infarction without residual deficits: Secondary | ICD-10-CM | POA: Diagnosis not present

## 2021-08-20 DIAGNOSIS — F0390 Unspecified dementia without behavioral disturbance: Secondary | ICD-10-CM | POA: Diagnosis not present

## 2021-08-20 DIAGNOSIS — J9811 Atelectasis: Secondary | ICD-10-CM | POA: Diagnosis not present

## 2021-08-20 DIAGNOSIS — M2550 Pain in unspecified joint: Secondary | ICD-10-CM | POA: Diagnosis not present

## 2021-08-20 DIAGNOSIS — Z20822 Contact with and (suspected) exposure to covid-19: Secondary | ICD-10-CM | POA: Diagnosis not present

## 2021-08-20 DIAGNOSIS — E7800 Pure hypercholesterolemia, unspecified: Secondary | ICD-10-CM | POA: Diagnosis not present

## 2021-08-20 DIAGNOSIS — R531 Weakness: Secondary | ICD-10-CM | POA: Diagnosis not present

## 2021-08-20 DIAGNOSIS — M1990 Unspecified osteoarthritis, unspecified site: Secondary | ICD-10-CM | POA: Diagnosis not present

## 2021-08-20 DIAGNOSIS — F419 Anxiety disorder, unspecified: Secondary | ICD-10-CM | POA: Diagnosis not present

## 2021-08-20 DIAGNOSIS — I4891 Unspecified atrial fibrillation: Secondary | ICD-10-CM | POA: Diagnosis not present

## 2021-08-20 DIAGNOSIS — E785 Hyperlipidemia, unspecified: Secondary | ICD-10-CM | POA: Diagnosis not present

## 2021-08-20 DIAGNOSIS — S32592A Other specified fracture of left pubis, initial encounter for closed fracture: Secondary | ICD-10-CM | POA: Diagnosis not present

## 2021-08-20 DIAGNOSIS — M25552 Pain in left hip: Secondary | ICD-10-CM | POA: Diagnosis not present

## 2021-08-20 DIAGNOSIS — E46 Unspecified protein-calorie malnutrition: Secondary | ICD-10-CM | POA: Diagnosis not present

## 2021-08-20 DIAGNOSIS — S32512A Fracture of superior rim of left pubis, initial encounter for closed fracture: Secondary | ICD-10-CM | POA: Diagnosis not present

## 2021-08-20 DIAGNOSIS — I482 Chronic atrial fibrillation, unspecified: Secondary | ICD-10-CM | POA: Diagnosis not present

## 2021-08-20 DIAGNOSIS — Z885 Allergy status to narcotic agent status: Secondary | ICD-10-CM | POA: Diagnosis not present

## 2021-08-23 DIAGNOSIS — F339 Major depressive disorder, recurrent, unspecified: Secondary | ICD-10-CM | POA: Diagnosis not present

## 2021-08-23 DIAGNOSIS — Z743 Need for continuous supervision: Secondary | ICD-10-CM | POA: Diagnosis not present

## 2021-08-23 DIAGNOSIS — E785 Hyperlipidemia, unspecified: Secondary | ICD-10-CM | POA: Diagnosis not present

## 2021-08-23 DIAGNOSIS — M6281 Muscle weakness (generalized): Secondary | ICD-10-CM | POA: Diagnosis not present

## 2021-08-23 DIAGNOSIS — W19XXXA Unspecified fall, initial encounter: Secondary | ICD-10-CM | POA: Diagnosis not present

## 2021-08-23 DIAGNOSIS — F419 Anxiety disorder, unspecified: Secondary | ICD-10-CM | POA: Diagnosis not present

## 2021-08-23 DIAGNOSIS — M810 Age-related osteoporosis without current pathological fracture: Secondary | ICD-10-CM | POA: Diagnosis not present

## 2021-08-23 DIAGNOSIS — S32512D Fracture of superior rim of left pubis, subsequent encounter for fracture with routine healing: Secondary | ICD-10-CM | POA: Diagnosis not present

## 2021-08-23 DIAGNOSIS — D519 Vitamin B12 deficiency anemia, unspecified: Secondary | ICD-10-CM | POA: Diagnosis not present

## 2021-08-23 DIAGNOSIS — W19XXXD Unspecified fall, subsequent encounter: Secondary | ICD-10-CM | POA: Diagnosis not present

## 2021-08-23 DIAGNOSIS — F0390 Unspecified dementia without behavioral disturbance: Secondary | ICD-10-CM | POA: Diagnosis not present

## 2021-08-23 DIAGNOSIS — M2550 Pain in unspecified joint: Secondary | ICD-10-CM | POA: Diagnosis not present

## 2021-08-23 DIAGNOSIS — I4891 Unspecified atrial fibrillation: Secondary | ICD-10-CM | POA: Diagnosis not present

## 2021-08-23 DIAGNOSIS — R279 Unspecified lack of coordination: Secondary | ICD-10-CM | POA: Diagnosis not present

## 2021-08-23 DIAGNOSIS — S32592A Other specified fracture of left pubis, initial encounter for closed fracture: Secondary | ICD-10-CM | POA: Diagnosis not present

## 2021-08-23 DIAGNOSIS — R262 Difficulty in walking, not elsewhere classified: Secondary | ICD-10-CM | POA: Diagnosis not present

## 2021-08-23 DIAGNOSIS — S32592D Other specified fracture of left pubis, subsequent encounter for fracture with routine healing: Secondary | ICD-10-CM | POA: Diagnosis not present

## 2021-08-23 DIAGNOSIS — E46 Unspecified protein-calorie malnutrition: Secondary | ICD-10-CM | POA: Diagnosis not present

## 2021-08-23 DIAGNOSIS — D649 Anemia, unspecified: Secondary | ICD-10-CM | POA: Diagnosis not present

## 2021-08-23 DIAGNOSIS — R2681 Unsteadiness on feet: Secondary | ICD-10-CM | POA: Diagnosis not present

## 2021-08-23 DIAGNOSIS — S32512A Fracture of superior rim of left pubis, initial encounter for closed fracture: Secondary | ICD-10-CM | POA: Diagnosis not present

## 2021-08-26 DIAGNOSIS — S32512D Fracture of superior rim of left pubis, subsequent encounter for fracture with routine healing: Secondary | ICD-10-CM | POA: Diagnosis not present

## 2021-08-26 DIAGNOSIS — R262 Difficulty in walking, not elsewhere classified: Secondary | ICD-10-CM | POA: Diagnosis not present

## 2021-08-26 DIAGNOSIS — M810 Age-related osteoporosis without current pathological fracture: Secondary | ICD-10-CM | POA: Diagnosis not present

## 2021-08-26 DIAGNOSIS — D649 Anemia, unspecified: Secondary | ICD-10-CM | POA: Diagnosis not present

## 2021-08-27 ENCOUNTER — Other Ambulatory Visit: Payer: Self-pay

## 2021-08-27 ENCOUNTER — Ambulatory Visit (INDEPENDENT_AMBULATORY_CARE_PROVIDER_SITE_OTHER): Payer: PPO | Admitting: Psychology

## 2021-08-27 DIAGNOSIS — I639 Cerebral infarction, unspecified: Secondary | ICD-10-CM

## 2021-08-27 DIAGNOSIS — G309 Alzheimer's disease, unspecified: Secondary | ICD-10-CM

## 2021-08-27 DIAGNOSIS — F0280 Dementia in other diseases classified elsewhere without behavioral disturbance: Secondary | ICD-10-CM

## 2021-08-27 NOTE — Progress Notes (Signed)
° °  Neuropsychology Feedback Session Eligha Bridegroom. [facility] [place] Department of Neurology  Reason for Referral:   IRIDIANA FONNER is a 87 y.o. right-handed Caucasian female referred by  Marlowe Kays, PA-C , to characterize her current cognitive functioning and assist with diagnostic clarity and treatment planning in the context of subjective cognitive decline and a prior stroke history.  Feedback:   Ms. Convey completed a comprehensive neuropsychological evaluation on 08/16/2021. Please refer to that encounter for the full report and recommendations. Briefly, results suggested primary impairments surrounding processing speed, cognitive flexibility, confrontation naming, visuospatial abilities, and delayed retrieval aspects of memory. There was some variability when learning information initially (i.e., encoding aspects of memory). consideration needs to be given to a late-onset Alzheimer's disease presentation as the most likely cause. Across memory testing, Ms. Akkerman was fully amnestic ("I don't remember anything") when asked about a previously read story after a brief delay. Deficits across confrontation naming, cognitive flexibility, and visuospatial abilities are consistent with typical disease in progression in Alzheimer's disease, thus increasing overall concerns. There is certainly the possibility for a vascular contribution (i.e., mixed dementia presentation) given prior neuroimaging suggesting a chronic left frontal infarct and moderate small vessel disease. Deficits in processing speed, cognitive flexibility, and memory would align with this etiology.  Ms. Behne was unable to attend the current feedback session due to falling over several days ago and being hospitalized. Her daughter who serves as her POA came in her place. Content of the current session focused on the results of Ms. Sigler' neuropsychological evaluation. Her daughter was given the opportunity to ask questions  and her questions were answered. She was encouraged to reach out should additional questions arise. A copy of Ms. Casco' report is available on MyChart.   Of note, her daughter asked about questions regarding her mother's competency in making medical decisions. While I explained that the current report was clinical/diagnostic in nature rather than a competency evaluation, I do feel that Ms. Batte clearly meets diagnostic criteria for dementia, does not have proper insight into her current functioning, and would be at risk of placing herself in potentially harmful or unsafe positions due to this lack of proper insight.      30 minutes were spent conducting the current feedback session with Ms. Zechman' daughter.

## 2021-08-28 ENCOUNTER — Telehealth: Payer: Self-pay

## 2021-08-28 NOTE — Telephone Encounter (Signed)
°  Transition Care Management Follow-up Telephone Call    CECILEY BUIST [birth year]  Admit Date: 08/20/2022 Discharge Date: 08/23/2022 Discharged from where: [facility]  Diagnoses: Fall, Initial encounter, Fracture of superior Rim of left pubis, initial encounter.  2 day post discharge: 08/25/2022 7 day post discharge: 08/30/2022 14 day post discharge: 09/06/2021  HADAS JESSOP was discharged from [facility] on 08/23/2022 with the diagnoses listed above.  She was contacted today via telephone in regards to transition of care.     Discharge Instructions: Patient was discharged and admitted in [facility] nursing home.     08/28/21 11:04 AM

## 2021-09-10 DIAGNOSIS — S32592A Other specified fracture of left pubis, initial encounter for closed fracture: Secondary | ICD-10-CM | POA: Diagnosis not present

## 2021-09-10 DIAGNOSIS — S32512A Fracture of superior rim of left pubis, initial encounter for closed fracture: Secondary | ICD-10-CM | POA: Diagnosis not present

## 2021-09-17 ENCOUNTER — Telehealth: Payer: Self-pay

## 2021-09-17 DIAGNOSIS — Z7901 Long term (current) use of anticoagulants: Secondary | ICD-10-CM | POA: Diagnosis not present

## 2021-09-17 DIAGNOSIS — E785 Hyperlipidemia, unspecified: Secondary | ICD-10-CM | POA: Diagnosis not present

## 2021-09-17 DIAGNOSIS — M80052D Age-related osteoporosis with current pathological fracture, left femur, subsequent encounter for fracture with routine healing: Secondary | ICD-10-CM | POA: Diagnosis not present

## 2021-09-17 DIAGNOSIS — K5900 Constipation, unspecified: Secondary | ICD-10-CM | POA: Diagnosis not present

## 2021-09-17 DIAGNOSIS — F0390 Unspecified dementia without behavioral disturbance: Secondary | ICD-10-CM | POA: Diagnosis not present

## 2021-09-17 DIAGNOSIS — W19XXXD Unspecified fall, subsequent encounter: Secondary | ICD-10-CM | POA: Diagnosis not present

## 2021-09-17 DIAGNOSIS — E46 Unspecified protein-calorie malnutrition: Secondary | ICD-10-CM | POA: Diagnosis not present

## 2021-09-17 DIAGNOSIS — I10 Essential (primary) hypertension: Secondary | ICD-10-CM | POA: Diagnosis not present

## 2021-09-17 DIAGNOSIS — H9190 Unspecified hearing loss, unspecified ear: Secondary | ICD-10-CM | POA: Diagnosis not present

## 2021-09-17 DIAGNOSIS — D649 Anemia, unspecified: Secondary | ICD-10-CM | POA: Diagnosis not present

## 2021-09-17 DIAGNOSIS — G8929 Other chronic pain: Secondary | ICD-10-CM | POA: Diagnosis not present

## 2021-09-17 DIAGNOSIS — F419 Anxiety disorder, unspecified: Secondary | ICD-10-CM | POA: Diagnosis not present

## 2021-09-17 DIAGNOSIS — S32592D Other specified fracture of left pubis, subsequent encounter for fracture with routine healing: Secondary | ICD-10-CM | POA: Diagnosis not present

## 2021-09-17 DIAGNOSIS — I4891 Unspecified atrial fibrillation: Secondary | ICD-10-CM | POA: Diagnosis not present

## 2021-09-17 DIAGNOSIS — S32512D Fracture of superior rim of left pubis, subsequent encounter for fracture with routine healing: Secondary | ICD-10-CM | POA: Diagnosis not present

## 2021-09-17 NOTE — Telephone Encounter (Signed)
Joni Reining, nurse with [facility] calling for verbal orders. Pt d/c from [facility] after left pubis fx. Ordered nursing and therapies. Joni Reining requesting schedule for nursing. Would like to see pt once a month for one month, twice a month for one month, then once a month for one month, also three PRN visits. Verbal orders for this given.   Terrill Mohr 09/17/21 3:23 PM

## 2021-09-20 ENCOUNTER — Encounter: Payer: Self-pay | Admitting: Legal Medicine

## 2021-09-20 ENCOUNTER — Other Ambulatory Visit: Payer: Self-pay

## 2021-09-20 ENCOUNTER — Ambulatory Visit (INDEPENDENT_AMBULATORY_CARE_PROVIDER_SITE_OTHER): Payer: PPO | Admitting: Legal Medicine

## 2021-09-20 VITALS — BP 100/68 | HR 68 | Temp 98.6°F | Resp 16 | Ht 63.0 in | Wt 101.0 lb

## 2021-09-20 DIAGNOSIS — F0280 Dementia in other diseases classified elsewhere without behavioral disturbance: Secondary | ICD-10-CM

## 2021-09-20 DIAGNOSIS — H918X3 Other specified hearing loss, bilateral: Secondary | ICD-10-CM | POA: Diagnosis not present

## 2021-09-20 DIAGNOSIS — I10 Essential (primary) hypertension: Secondary | ICD-10-CM | POA: Diagnosis not present

## 2021-09-20 DIAGNOSIS — Z681 Body mass index (BMI) 19 or less, adult: Secondary | ICD-10-CM

## 2021-09-20 DIAGNOSIS — G309 Alzheimer's disease, unspecified: Secondary | ICD-10-CM | POA: Diagnosis not present

## 2021-09-20 DIAGNOSIS — I69390 Apraxia following cerebral infarction: Secondary | ICD-10-CM

## 2021-09-20 DIAGNOSIS — J449 Chronic obstructive pulmonary disease, unspecified: Secondary | ICD-10-CM | POA: Diagnosis not present

## 2021-09-20 DIAGNOSIS — I700 Atherosclerosis of aorta: Secondary | ICD-10-CM | POA: Diagnosis not present

## 2021-09-20 DIAGNOSIS — E782 Mixed hyperlipidemia: Secondary | ICD-10-CM | POA: Diagnosis not present

## 2021-09-20 DIAGNOSIS — F01518 Vascular dementia, unspecified severity, with other behavioral disturbance: Secondary | ICD-10-CM | POA: Diagnosis not present

## 2021-09-20 DIAGNOSIS — I480 Paroxysmal atrial fibrillation: Secondary | ICD-10-CM | POA: Diagnosis not present

## 2021-09-20 DIAGNOSIS — E440 Moderate protein-calorie malnutrition: Secondary | ICD-10-CM

## 2021-09-20 DIAGNOSIS — S329XXA Fracture of unspecified parts of lumbosacral spine and pelvis, initial encounter for closed fracture: Secondary | ICD-10-CM

## 2021-09-20 NOTE — Progress Notes (Signed)
Subjective:  Patient ID: [name], female    DOB: 02-07-1926  Age: 88 y.o. MRN: [id number]  Chief Complaint  Patient presents with   Atrial Fibrillation   Hypertension   Hyperlipidemia    HPI Pelvic fracture: Patient went to orthopedic and he said that it will get more time to heal, but it is healing nice. She is walking with walker with wheels and he denied any pain. She has sitter 24 hours at home. She was discharged from [facility] one week ago.  Hypertension: Patient is taking acetabutalol 200 mg daily. She does not eat low sodium diet. An individual hypertension care plan was established and reinforced today.  The patient's status was assessed using clinical findings on exam and labs or diagnostic tests. The patient's success at meeting treatment goals on disease specific evidence-based guidelines and found to be well controlled. SELF MANAGEMENT: The patient and I together assessed ways to personally work towards obtaining the recommended goals. RECOMMENDATIONS: avoid decongestants found in common cold remedies, decrease consumption of alcohol, perform routine monitoring of BP with home BP cuff, exercise, reduction of dietary salt, take medicines as prescribed, try not to miss doses and quit smoking.  Regular exercise and maintaining a healthy weight is needed.  Stress reduction may help. A CLINICAL SUMMARY including written plan identify barriers to care unique to individual due to social or financial issues.  We attempt to mutually creat solutions for individual and family understanding.   Hyperlipidemia: Currently taking atorvastatin 40 mg daily. AN INDIVIDUAL CARE PLAN for hyperlipidemia/ cholesterol was established and reinforced today.  The patient's status was assessed using clinical findings on exam, lab and other diagnostic tests. The patient's disease status was assessed based on evidence-based guidelines and found to be well controlled. MEDICATIONS were reviewed. SELF  MANAGEMENT GOALS have been discussed and patient's success at attaining the goal of low cholesterol was assessed. RECOMMENDATION given include regular exercise 3 days a week and low cholesterol/low fat diet. CLINICAL SUMMARY including written plan to identify barriers unique to the patient due to social or economic  reasons was discussed.   Afib: She takes Eliquis 2.5 mg daily. She is stable  Patient had a full neuropsychological evaluation with diagnosis of alzheimer disease with some vascular dementia also.  Family has her under care.  She is not taking protein supplement because she is taking miralax. Current Outpatient Medications on File Prior to Visit  Medication Sig Dispense Refill   acebutolol (SECTRAL) 200 MG capsule Take 1 capsule (200 mg total) by mouth daily. Open capsule and give with apple sauce 90 capsule 3   apixaban (ELIQUIS) 2.5 MG TABS tablet TAKE 1 TABLET(2.5 MG) BY MOUTH TWICE DAILY 180 tablet 1   atorvastatin (LIPITOR) 40 MG tablet TAKE 1 TABLET(40 MG) BY MOUTH DAILY 90 tablet 2   Cholecalciferol (VITAMIN D3) 1000 units CAPS Take 1,000 Units by mouth daily.     divalproex (DEPAKOTE ER) 500 MG 24 hr tablet TAKE 1 TABLET(500 MG) BY MOUTH AT BEDTIME 30 tablet 1   docusate sodium (COLACE) 100 MG capsule Take 100 mg by mouth 2 (two) times daily.     donepezil (ARICEPT) 5 MG tablet Take 1 tablet ( 5 mg ) daily (Patient taking differently: 10 mg. Take 1 tablet po qd) 30 tablet 11   melatonin 1 MG TABS tablet Take 1 mg by mouth at bedtime as needed (SLEEP).     mirtazapine (REMERON) 30 MG tablet TAKE 1 TABLET(30 MG) BY MOUTH AT  BEDTIME 90 tablet 1   Multiple Vitamins-Minerals (PRESERVISION AREDS 2) CAPS Take by mouth.     OVER THE COUNTER MEDICATION Take 1 tablet by mouth daily. Selenex GSH supplement     prednisoLONE acetate (PRED FORTE) 1 % ophthalmic suspension      vitamin B-12 (CYANOCOBALAMIN) 500 MCG tablet Take 500 mcg by mouth daily.      No current  facility-administered medications on file prior to visit.   Past Medical History:  Diagnosis Date   Acute ischemic stroke 11/08/2018   left postcentral gyri; left frontal   Adjustment disorder with depressed mood 10/16/2019   Alteration consciousness 03/08/2020   Apraxia following cerebrovascular accident 10/16/2019   B12 deficiency 10/16/2019   Bilateral impacted cerumen 04/02/2021   Body mass index (BMI) less than 19 02/16/2020   Chronic kidney disease, stage II (mild) 12/06/2019   Closed fracture of one rib of right side with routine healing 06/30/2017   Confusion    COPD (chronic obstructive pulmonary disease) 11/16/2017   Current use of long term anticoagulation 07/21/2017   Essential hypertension, benign 10/16/2019   First degree atrioventricular block 06/26/2017   Gait abnormality 09/24/2020   Hammer toe 06/26/2017   Hearing loss    Hyperlipidemia 06/26/2017   Laceration of right lower leg 06/18/2020   Major neurocognitive disorder due to Alzheimer's disease 08/16/2021   Malnutrition of moderate degree 12/06/2019   Memory loss    Other specified cardiac arrhythmias 06/26/2017   Pain in limb 06/26/2017   Palpitations 06/26/2017   Paroxysmal atrial fibrillation 06/26/2017   remote history post operatively, CHADS 2 score=2   Premature atrial complex 06/26/2017   Premature ventricular contractions 06/26/2017   PSVT (paroxysmal supraventricular tachycardia) 11/10/2017   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 10/16/2019   Thoracic aorta atherosclerosis 05/22/2020   Traumatic pneumothorax 06/30/2017   Past Surgical History:  Procedure Laterality Date   ABDOMINAL HYSTERECTOMY     SHOULDER SURGERY     TONSILLECTOMY      Family History  Problem Relation Age of Onset   Bone cancer Mother    Breast cancer Father    Diabetes Father    Heart disease Father    Dementia Sister    Breast cancer Sister    Alzheimer's disease Sister    Dementia Brother    Social  History   Socioeconomic History   Marital status: Widowed    Spouse name: Not on file   Number of children: 2   Years of education: 12   Highest education level: High school graduate  Occupational History   Occupation: Retired  Tobacco Use   Smoking status: Never   Smokeless tobacco: Never  Vaping Use   Vaping Use: Never used  Substance and Sexual Activity   Alcohol use: No   Drug use: No   Sexual activity: Not Currently  Other Topics Concern   Not on file  Social History Narrative   [name] is widowed. Her daughter, Cloyd StagersGwen Dorsett, is her POA and lives 12 miles away. She resides in her own home, alone for last 7 years. Since her CVA she has had someone with her most of the time. She has a hired caregiver or a family member stays most of the day. She feel safe in her home and in her neighborhood.   Right-handed.   0.5 cups caffeine per day.   Social Determinants of Health   Financial Resource Strain: Not on file  Food Insecurity: Not on file  Transportation Needs:  Not on file  Physical Activity: Not on file  Stress: Not on file  Social Connections: Not on file    Review of Systems  Constitutional: Negative.  Negative for activity change and appetite change.  HENT:  Negative for congestion.   Eyes: Negative.   Respiratory: Negative.  Negative for chest tightness and shortness of breath.   Cardiovascular:  Negative for chest pain, palpitations and leg swelling.  Gastrointestinal: Negative.  Negative for abdominal distention and abdominal pain.  Endocrine: Negative.   Genitourinary: Negative.   Musculoskeletal: Negative.   Neurological: Negative.   Psychiatric/Behavioral: Negative.      Objective:  BP 100/68    Pulse 68    Temp 98.6 F (37 C)    Resp 16    Ht 5\' 3"  (1.6 m)    Wt 101 lb (45.8 kg)    LMP  (LMP Unknown)    SpO2 98%    BMI 17.89 kg/m   BP/Weight 09/20/2021 07/18/2021 [id number]  Systolic BP 123XX123 [id number]  Diastolic BP 68 72 62  Wt. (Lbs) 101 105 104   BMI 17.89 18.6 18.42    Physical Exam Vitals reviewed.  Constitutional:      General: She is not in acute distress.    Appearance: Normal appearance. She is obese.  HENT:     Head: Normocephalic.     Right Ear: Tympanic membrane, ear canal and external ear normal.     Left Ear: Tympanic membrane, ear canal and external ear normal.     Nose: Nose normal.     Mouth/Throat:     Mouth: Mucous membranes are moist.     Pharynx: Oropharynx is clear. No posterior oropharyngeal erythema.  Eyes:     Extraocular Movements: Extraocular movements intact.     Conjunctiva/sclera: Conjunctivae normal.     Pupils: Pupils are equal, round, and reactive to light.  Neck:     Vascular: No carotid bruit.  Cardiovascular:     Rate and Rhythm: Normal rate. Rhythm irregular.     Pulses: Normal pulses.     Heart sounds: No murmur heard. Pulmonary:     Effort: Pulmonary effort is normal. No respiratory distress.     Breath sounds: No wheezing.  Abdominal:     General: Bowel sounds are normal. There is no distension.     Palpations: Abdomen is soft. There is no mass.     Tenderness: There is no abdominal tenderness.  Musculoskeletal:        General: Normal range of motion.     Cervical back: Normal range of motion. No tenderness.     Right lower leg: No edema.     Left lower leg: No edema.  Skin:    General: Skin is warm.     Capillary Refill: Capillary refill takes less than 2 seconds.  Neurological:     General: No focal deficit present.     Mental Status: She is alert. Mental status is at baseline.     Gait: Gait normal.     Deep Tendon Reflexes: Reflexes normal.  Psychiatric:        Mood and Affect: Mood normal.        Behavior: Behavior normal.        Thought Content: Thought content normal.        Lab Results  Component Value Date   WBC 5.7 09/20/2021   HGB 11.9 09/20/2021   HCT 35.1 09/20/2021   PLT 315 09/20/2021   GLUCOSE 75  09/20/2021   CHOL 112 03/18/2021   TRIG 78  03/18/2021   HDL 46 03/18/2021   LDLCALC 50 03/18/2021   ALT 8 09/20/2021   AST 15 09/20/2021   NA 140 09/20/2021   K 5.2 09/20/2021   CL 100 09/20/2021   CREATININE 0.73 09/20/2021   BUN 16 09/20/2021   CO2 27 09/20/2021   TSH 1.460 05/22/2020   HGBA1C 5.3 11/09/2018      Assessment & Plan:   Problem List Items Addressed This Visit       Cardiovascular and Mediastinum   Paroxysmal atrial fibrillation Patient has a diagnosis of paroxysmal atrial fibrillation.   Patient is on eliquis and has controlled ventricular response.  Patient is CV stable .    Thoracic aorta atherosclerosis Patient has atherosclerosis on atorvastatin     Respiratory   COPD (chronic obstructive pulmonary disease) An individualize plan was formulated for care of COPD.  Treatment is evidence based.  She will continue on inhalers, avoid smoking and smoke.  Regular exercise with help with dyspnea. Routine follow ups and medication compliance is needed.      Nervous and Auditory   Hearing loss Patient has hearing aids    Major neurocognitive disorder due to Alzheimer's disease Patient has moderately severe DAT with some vascular components, under care of family     Musculoskeletal and Integument   Apraxia following cerebrovascular accident Patient has apraxia from her CVA     Other   Hyperlipidemia - Primary AN INDIVIDUAL CARE PLAN for hyperlipidemia/ cholesterol was established and reinforced today.  The patient's status was assessed using clinical findings on exam, lab and other diagnostic tests. The patient's disease status was assessed based on evidence-based guidelines and found to be well controlled. MEDICATIONS were reviewed. SELF MANAGEMENT GOALS have been discussed and patient's success at attaining the goal of low cholesterol was assessed. RECOMMENDATION given include regular exercise 3 days a week and low cholesterol/low fat diet. CLINICAL SUMMARY including written plan to identify  barriers unique to the patient due to social or economic  reasons was discussed.     Malnutrition of moderate degree Supplement nutrition with protein/calorie supplement with meals to improve nutritional status.     Body mass index (BMI) less than 19 Supplement nutrition with protein/calorie supplement with meals to improve nutritional status.    Other Visit Diagnoses     Benign essential hypertension       Relevant Orders   CBC with Differential/Platelet (Completed)   Comprehensive metabolic panel (Completed) An individual hypertension care plan was established and reinforced today.  The patient's status was assessed using clinical findings on exam and labs or diagnostic tests. The patient's success at meeting treatment goals on disease specific evidence-based guidelines and found to be well controlled. SELF MANAGEMENT: The patient and I together assessed ways to personally work towards obtaining the recommended goals. RECOMMENDATIONS: avoid decongestants found in common cold remedies, decrease consumption of alcohol, perform routine monitoring of BP with home BP cuff, exercise, reduction of dietary salt, take medicines as prescribed, try not to miss doses and quit smoking.  Regular exercise and maintaining a healthy weight is needed.  Stress reduction may help. A CLINICAL SUMMARY including written plan identify barriers to care unique to individual due to social or financial issues.  We attempt to mutually creat solutions for individual and family understanding.     Dementia, multiinfarct, with behavioral disturbance     Patient has mixed dementia    Closed nondisplaced fracture  of pelvis, unspecified part of pelvis, initial encounter [facility])     Patient has healing pelvic rami fracture , she is walking with walker, little pain     No further epilepsy on medicines    Orders Placed This Encounter  Procedures   CBC with Differential/Platelet   Comprehensive metabolic panel   30 minute  visit with neurology note review  Follow-up: Return in about 3 months (around 12/19/2021) for fasting.  An After Visit Summary was printed and given to the patient.  Reinaldo Meeker, MD [facility] [phone]

## 2021-09-21 LAB — CBC WITH DIFFERENTIAL/PLATELET
Basophils Absolute: 0 10*3/uL (ref 0.0–0.2)
Basos: 1 %
EOS (ABSOLUTE): 0.2 10*3/uL (ref 0.0–0.4)
Eos: 3 %
Hematocrit: 35.1 % (ref 34.0–46.6)
Hemoglobin: 11.9 g/dL (ref 11.1–15.9)
Immature Grans (Abs): 0 10*3/uL (ref 0.0–0.1)
Immature Granulocytes: 0 %
Lymphocytes Absolute: 2.1 10*3/uL (ref 0.7–3.1)
Lymphs: 36 %
MCH: 33.7 pg — ABNORMAL HIGH (ref 26.6–33.0)
MCHC: 33.9 g/dL (ref 31.5–35.7)
MCV: 99 fL — ABNORMAL HIGH (ref 79–97)
Monocytes Absolute: 0.7 10*3/uL (ref 0.1–0.9)
Monocytes: 12 %
Neutrophils Absolute: 2.7 10*3/uL (ref 1.4–7.0)
Neutrophils: 48 %
Platelets: 315 10*3/uL (ref 150–450)
RBC: 3.53 x10E6/uL — ABNORMAL LOW (ref 3.77–5.28)
RDW: 12.8 % (ref 11.7–15.4)
WBC: 5.7 10*3/uL (ref 3.4–10.8)

## 2021-09-21 LAB — COMPREHENSIVE METABOLIC PANEL
ALT: 8 IU/L (ref 0–32)
AST: 15 IU/L (ref 0–40)
Albumin/Globulin Ratio: 1.6 (ref 1.2–2.2)
Albumin: 4.1 g/dL (ref 3.5–4.6)
Alkaline Phosphatase: 158 IU/L — ABNORMAL HIGH (ref 44–121)
BUN/Creatinine Ratio: 22 (ref 12–28)
BUN: 16 mg/dL (ref 10–36)
Bilirubin Total: 0.5 mg/dL (ref 0.0–1.2)
CO2: 27 mmol/L (ref 20–29)
Calcium: 9.6 mg/dL (ref 8.7–10.3)
Chloride: 100 mmol/L (ref 96–106)
Creatinine, Ser: 0.73 mg/dL (ref 0.57–1.00)
Globulin, Total: 2.5 g/dL (ref 1.5–4.5)
Glucose: 75 mg/dL (ref 70–99)
Potassium: 5.2 mmol/L (ref 3.5–5.2)
Sodium: 140 mmol/L (ref 134–144)
Total Protein: 6.6 g/dL (ref 6.0–8.5)
eGFR: 77 mL/min/{1.73_m2} (ref >59)

## 2021-09-22 NOTE — Progress Notes (Signed)
Cbc ok, kidney and liver tests normal,  lp

## 2021-09-23 ENCOUNTER — Encounter: Payer: Self-pay | Admitting: Legal Medicine

## 2021-09-23 NOTE — Telephone Encounter (Signed)
Can drop off a urine lp

## 2021-09-23 NOTE — Telephone Encounter (Signed)
Alkaline phosphatase is elevated due to her being post menopausal and osteoporosis lp

## 2021-09-24 ENCOUNTER — Ambulatory Visit (INDEPENDENT_AMBULATORY_CARE_PROVIDER_SITE_OTHER): Payer: PPO

## 2021-09-24 DIAGNOSIS — N3001 Acute cystitis with hematuria: Secondary | ICD-10-CM

## 2021-09-24 LAB — POCT URINALYSIS DIP (CLINITEK)
Bilirubin, UA: NEGATIVE
Blood, UA: NEGATIVE
Glucose, UA: NEGATIVE mg/dL
Ketones, POC UA: NEGATIVE mg/dL
Leukocytes, UA: NEGATIVE
Nitrite, UA: NEGATIVE
Spec Grav, UA: 1.025 (ref 1.010–1.025)
Urobilinogen, UA: 0.2 E.U./dL
pH, UA: 6 (ref 5.0–8.0)

## 2021-09-24 NOTE — Progress Notes (Signed)
Patient's daughter brought urine sample to recheck after UTI treatment.  UA is normal per Dr Marina Goodell. I called [name] to be notified.

## 2021-10-01 DIAGNOSIS — E785 Hyperlipidemia, unspecified: Secondary | ICD-10-CM | POA: Diagnosis not present

## 2021-10-01 DIAGNOSIS — D649 Anemia, unspecified: Secondary | ICD-10-CM | POA: Diagnosis not present

## 2021-10-01 DIAGNOSIS — I10 Essential (primary) hypertension: Secondary | ICD-10-CM | POA: Diagnosis not present

## 2021-10-01 DIAGNOSIS — I4891 Unspecified atrial fibrillation: Secondary | ICD-10-CM | POA: Diagnosis not present

## 2021-10-02 ENCOUNTER — Other Ambulatory Visit: Payer: Self-pay | Admitting: Legal Medicine

## 2021-10-02 ENCOUNTER — Encounter: Payer: Self-pay | Admitting: Legal Medicine

## 2021-10-02 DIAGNOSIS — K5909 Other constipation: Secondary | ICD-10-CM

## 2021-10-02 MED ORDER — LACTULOSE 10 GM/15ML PO SOLN: 10.0000 g | Freq: Every day | ORAL | 3 refills | Status: DC

## 2021-10-02 NOTE — Progress Notes (Unsigned)
c 

## 2021-10-02 NOTE — Telephone Encounter (Signed)
Sent in chonulac for constipation lp

## 2021-10-08 DIAGNOSIS — F330 Major depressive disorder, recurrent, mild: Secondary | ICD-10-CM | POA: Diagnosis not present

## 2021-10-08 DIAGNOSIS — I8393 Asymptomatic varicose veins of bilateral lower extremities: Secondary | ICD-10-CM | POA: Diagnosis not present

## 2021-10-08 DIAGNOSIS — J449 Chronic obstructive pulmonary disease, unspecified: Secondary | ICD-10-CM | POA: Diagnosis not present

## 2021-10-08 DIAGNOSIS — I69331 Monoplegia of upper limb following cerebral infarction affecting right dominant side: Secondary | ICD-10-CM | POA: Diagnosis not present

## 2021-10-08 DIAGNOSIS — W19XXXD Unspecified fall, subsequent encounter: Secondary | ICD-10-CM | POA: Diagnosis not present

## 2021-10-08 DIAGNOSIS — D692 Other nonthrombocytopenic purpura: Secondary | ICD-10-CM | POA: Diagnosis not present

## 2021-10-08 DIAGNOSIS — S32512D Fracture of superior rim of left pubis, subsequent encounter for fracture with routine healing: Secondary | ICD-10-CM | POA: Diagnosis not present

## 2021-10-08 DIAGNOSIS — F0390 Unspecified dementia without behavioral disturbance: Secondary | ICD-10-CM | POA: Diagnosis not present

## 2021-10-08 DIAGNOSIS — D6869 Other thrombophilia: Secondary | ICD-10-CM | POA: Diagnosis not present

## 2021-10-08 DIAGNOSIS — E785 Hyperlipidemia, unspecified: Secondary | ICD-10-CM | POA: Diagnosis not present

## 2021-10-08 DIAGNOSIS — I739 Peripheral vascular disease, unspecified: Secondary | ICD-10-CM | POA: Diagnosis not present

## 2021-10-08 DIAGNOSIS — I480 Paroxysmal atrial fibrillation: Secondary | ICD-10-CM | POA: Diagnosis not present

## 2021-10-17 DIAGNOSIS — I4891 Unspecified atrial fibrillation: Secondary | ICD-10-CM | POA: Diagnosis not present

## 2021-10-17 DIAGNOSIS — F419 Anxiety disorder, unspecified: Secondary | ICD-10-CM | POA: Diagnosis not present

## 2021-10-17 DIAGNOSIS — D649 Anemia, unspecified: Secondary | ICD-10-CM | POA: Diagnosis not present

## 2021-10-17 DIAGNOSIS — Z7901 Long term (current) use of anticoagulants: Secondary | ICD-10-CM | POA: Diagnosis not present

## 2021-10-17 DIAGNOSIS — G8929 Other chronic pain: Secondary | ICD-10-CM | POA: Diagnosis not present

## 2021-10-17 DIAGNOSIS — M80052D Age-related osteoporosis with current pathological fracture, left femur, subsequent encounter for fracture with routine healing: Secondary | ICD-10-CM | POA: Diagnosis not present

## 2021-10-17 DIAGNOSIS — I10 Essential (primary) hypertension: Secondary | ICD-10-CM | POA: Diagnosis not present

## 2021-10-17 DIAGNOSIS — H9190 Unspecified hearing loss, unspecified ear: Secondary | ICD-10-CM | POA: Diagnosis not present

## 2021-10-17 DIAGNOSIS — E785 Hyperlipidemia, unspecified: Secondary | ICD-10-CM | POA: Diagnosis not present

## 2021-10-17 DIAGNOSIS — E46 Unspecified protein-calorie malnutrition: Secondary | ICD-10-CM | POA: Diagnosis not present

## 2021-10-17 DIAGNOSIS — K5900 Constipation, unspecified: Secondary | ICD-10-CM | POA: Diagnosis not present

## 2021-10-17 DIAGNOSIS — F0390 Unspecified dementia without behavioral disturbance: Secondary | ICD-10-CM | POA: Diagnosis not present

## 2021-10-28 ENCOUNTER — Encounter: Payer: Self-pay | Admitting: Legal Medicine

## 2021-10-29 ENCOUNTER — Ambulatory Visit (INDEPENDENT_AMBULATORY_CARE_PROVIDER_SITE_OTHER): Payer: PPO | Admitting: Legal Medicine

## 2021-10-29 ENCOUNTER — Other Ambulatory Visit: Payer: Self-pay

## 2021-10-29 ENCOUNTER — Encounter: Payer: Self-pay | Admitting: Legal Medicine

## 2021-10-29 VITALS — BP 102/70 | HR 72 | Temp 98.3°F | Ht 63.0 in | Wt 99.8 lb

## 2021-10-29 DIAGNOSIS — M545 Low back pain, unspecified: Secondary | ICD-10-CM | POA: Diagnosis not present

## 2021-10-29 DIAGNOSIS — M549 Dorsalgia, unspecified: Secondary | ICD-10-CM | POA: Insufficient documentation

## 2021-10-29 MED ORDER — TRAMADOL HCL 50 MG PO TABS: 50.0000 mg | ORAL_TABLET | Freq: Three times a day (TID) | ORAL | 0 refills | Status: AC | PRN

## 2021-10-29 MED ORDER — LIDOCAINE 4 % EX PTCH: 1.0000 | MEDICATED_PATCH | CUTANEOUS | 2 refills | Status: DC | PRN

## 2021-10-29 NOTE — Progress Notes (Signed)
Acute Office Visit  Subjective:    Patient ID: [name], female    DOB: 1926/02/12, 88 y.o.   MRN: [id number]  Chief Complaint  Patient presents with   Back Pain    HPI: Patient states all symptoms started Saturday. Back pain constantly in pain. she had fracture spine and lumbar area 08/20/21.  She has not had much pain unitl this week.  [name] is sending mobile x-ray to her house. No falls or bladder or bowel changes.  She is using tylenol without help.  Past Medical History:  Diagnosis Date   Acute ischemic stroke 11/08/2018   left postcentral gyri; left frontal   Adjustment disorder with depressed mood 10/16/2019   Alteration consciousness 03/08/2020   Apraxia following cerebrovascular accident 10/16/2019   B12 deficiency 10/16/2019   Bilateral impacted cerumen 04/02/2021   Body mass index (BMI) less than 19 02/16/2020   Chronic kidney disease, stage II (mild) 12/06/2019   Closed fracture of one rib of right side with routine healing 06/30/2017   Confusion    COPD (chronic obstructive pulmonary disease) 11/16/2017   Current use of long term anticoagulation 07/21/2017   Essential hypertension, benign 10/16/2019   First degree atrioventricular block 06/26/2017   Gait abnormality 09/24/2020   Hammer toe 06/26/2017   Hearing loss    Hyperlipidemia 06/26/2017   Laceration of right lower leg 06/18/2020   Major neurocognitive disorder due to Alzheimer's disease 08/16/2021   Malnutrition of moderate degree 12/06/2019   Memory loss    Other specified cardiac arrhythmias 06/26/2017   Pain in limb 06/26/2017   Palpitations 06/26/2017   Paroxysmal atrial fibrillation 06/26/2017   remote history post operatively, CHADS 2 score=2   Premature atrial complex 06/26/2017   Premature ventricular contractions 06/26/2017   PSVT (paroxysmal supraventricular tachycardia) 11/10/2017   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 10/16/2019   Thoracic aorta atherosclerosis  05/22/2020   Traumatic pneumothorax 06/30/2017    Past Surgical History:  Procedure Laterality Date   ABDOMINAL HYSTERECTOMY     SHOULDER SURGERY     TONSILLECTOMY      Family History  Problem Relation Age of Onset   Bone cancer Mother    Breast cancer Father    Diabetes Father    Heart disease Father    Dementia Sister    Breast cancer Sister    Alzheimer's disease Sister    Dementia Brother     Social History   Socioeconomic History   Marital status: Widowed    Spouse name: Not on file   Number of children: 2   Years of education: 12   Highest education level: High school graduate  Occupational History   Occupation: Retired  Tobacco Use   Smoking status: Never   Smokeless tobacco: Never  Vaping Use   Vaping Use: Never used  Substance and Sexual Activity   Alcohol use: No   Drug use: No   Sexual activity: Not Currently  Other Topics Concern   Not on file  Social History Narrative   [name] is widowed. Her daughter, [name], is her POA and lives 12 miles away. She resides in her own home, alone for last 7 years. Since her CVA she has had someone with her most of the time. She has a hired caregiver or a family member stays most of the day. She feel safe in her home and in her neighborhood.   Right-handed.   0.5 cups caffeine per day.   Social Determinants of  Health   Financial Resource Strain: Not on file  Food Insecurity: Not on file  Transportation Needs: Not on file  Physical Activity: Not on file  Stress: Not on file  Social Connections: Not on file  Intimate Partner Violence: Not on file    Outpatient Medications Prior to Visit  Medication Sig Dispense Refill   acebutolol (SECTRAL) 200 MG capsule Take 1 capsule (200 mg total) by mouth daily. Open capsule and give with apple sauce 90 capsule 3   apixaban (ELIQUIS) 2.5 MG TABS tablet TAKE 1 TABLET(2.5 MG) BY MOUTH TWICE DAILY 180 tablet 1   atorvastatin (LIPITOR) 40 MG tablet TAKE 1  TABLET(40 MG) BY MOUTH DAILY 90 tablet 2   Cholecalciferol (VITAMIN D3) 1000 units CAPS Take 1,000 Units by mouth daily.     divalproex (DEPAKOTE ER) 500 MG 24 hr tablet TAKE 1 TABLET(500 MG) BY MOUTH AT BEDTIME 30 tablet 1   docusate sodium (COLACE) 100 MG capsule Take 100 mg by mouth 2 (two) times daily.     donepezil (ARICEPT) 5 MG tablet Take 1 tablet ( 5 mg ) daily (Patient taking differently: 10 mg. Take 1 tablet po qd) 30 tablet 11   lactulose (CHRONULAC) 10 GM/15ML solution Take 15 mLs (10 g total) by mouth daily. 236 mL 3   melatonin 1 MG TABS tablet Take 1 mg by mouth at bedtime as needed (SLEEP).     mirtazapine (REMERON) 30 MG tablet TAKE 1 TABLET(30 MG) BY MOUTH AT BEDTIME 90 tablet 1   Multiple Vitamins-Minerals (PRESERVISION AREDS 2) CAPS Take by mouth.     OVER THE COUNTER MEDICATION Take 1 tablet by mouth daily. Selenex GSH supplement     prednisoLONE acetate (PRED FORTE) 1 % ophthalmic suspension      vitamin B-12 (CYANOCOBALAMIN) 500 MCG tablet Take 500 mcg by mouth daily.      No facility-administered medications prior to visit.    Allergies  Allergen Reactions   Codeine Nausea And Vomiting   Penicillins Rash   Sulfa Antibiotics Nausea And Vomiting    Review of Systems  Constitutional:  Negative for appetite change, chills, fatigue and fever.  HENT:  Negative for congestion, ear discharge, ear pain, rhinorrhea, sinus pressure, sneezing and sore throat.   Eyes:  Negative for visual disturbance.  Respiratory:  Negative for cough, chest tightness, shortness of breath and wheezing.   Cardiovascular:  Negative for chest pain, palpitations and leg swelling.  Gastrointestinal:  Negative for abdominal pain, diarrhea, nausea and vomiting.  Endocrine: Negative for polydipsia, polyphagia and polyuria.  Genitourinary:  Negative for difficulty urinating, dysuria, frequency, hematuria, menstrual problem, urgency, vaginal bleeding, vaginal discharge and vaginal pain.   Musculoskeletal:  Positive for back pain. Negative for gait problem, joint swelling, myalgias and neck pain.  Neurological:  Negative for dizziness, seizures, syncope, weakness, numbness and headaches.  Psychiatric/Behavioral:  Negative for agitation, confusion, hallucinations, sleep disturbance and suicidal ideas. The patient is not nervous/anxious.       Objective:    Physical Exam Vitals reviewed.  Constitutional:      General: She is not in acute distress.    Appearance: Normal appearance.  HENT:     Head: Normocephalic.     Right Ear: Tympanic membrane normal.     Left Ear: Tympanic membrane normal.     Nose: Nose normal.  Cardiovascular:     Rate and Rhythm: Normal rate and regular rhythm.     Pulses: Normal pulses.     Heart  sounds: Normal heart sounds. No murmur heard.   No gallop.  Pulmonary:     Effort: Pulmonary effort is normal. No respiratory distress.     Breath sounds: Normal breath sounds. No wheezing.  Musculoskeletal:       Back:     Comments: Pain in paraspinal muscles that are tense, flexion 10 degrees, no extension rotation 30 degrees without pain.  Neurological:     Mental Status: She is alert.    BP 102/70    Pulse 72    Temp 98.3 F (36.8 C)    Ht 5' 3"  (1.6 m)    Wt 99 lb 12.8 oz (45.3 kg)    LMP  (LMP Unknown)    SpO2 95%    BMI 17.68 kg/m  Wt Readings from Last 3 Encounters:  10/29/21 99 lb 12.8 oz (45.3 kg)  09/20/21 101 lb (45.8 kg)  07/18/21 105 lb (47.6 kg)    Health Maintenance Due  Topic Date Due   Zoster Vaccines- Shingrix (1 of 2) Never done   DEXA SCAN  Never done   COVID-19 Vaccine (3 - Pfizer risk series) 10/31/2019    There are no preventive care reminders to display for this patient.   Lab Results  Component Value Date   TSH 1.460 05/22/2020   Lab Results  Component Value Date   WBC 5.7 09/20/2021   HGB 11.9 09/20/2021   HCT 35.1 09/20/2021   MCV 99 (H) 09/20/2021   PLT 315 09/20/2021   Lab Results  Component  Value Date   NA 140 09/20/2021   K 5.2 09/20/2021   CO2 27 09/20/2021   GLUCOSE 75 09/20/2021   BUN 16 09/20/2021   CREATININE 0.73 09/20/2021   BILITOT 0.5 09/20/2021   ALKPHOS 158 (H) 09/20/2021   AST 15 09/20/2021   ALT 8 09/20/2021   PROT 6.6 09/20/2021   ALBUMIN 4.1 09/20/2021   CALCIUM 9.6 09/20/2021   ANIONGAP 6 11/09/2018   EGFR 77 09/20/2021   Lab Results  Component Value Date   CHOL 112 03/18/2021   Lab Results  Component Value Date   HDL 46 03/18/2021   Lab Results  Component Value Date   LDLCALC 50 03/18/2021   Lab Results  Component Value Date   TRIG 78 03/18/2021   Lab Results  Component Value Date   CHOLHDL 2.4 03/18/2021   Lab Results  Component Value Date   HGBA1C 5.3 11/09/2018       Assessment & Plan:   Diagnoses and all orders for this visit: Acute bilateral low back pain without sciatica -     Lidocaine (HM LIDOCAINE PATCH) 4 % PTCH; Apply 1 each topically as needed. -     traMADol (ULTRAM) 50 MG tablet; Take 1 tablet (50 mg total) by mouth every 8 (eight) hours as needed for up to 5 days. Patient fe December 2021, has pelvic rami fractures and fracture of spine on CT.  She is to getx-ray mobile x-ray unit this week.  No neurologic changes.  Control pain and await x-rays, get copy of last Dexa        Follow-up: Return in about 2 weeks (around 11/12/2021) for back.  An After Visit Summary was printed and given to the patient.  Reinaldo Meeker, MD [facility] [phone]

## 2021-10-30 ENCOUNTER — Encounter: Payer: Self-pay | Admitting: Legal Medicine

## 2021-10-30 DIAGNOSIS — N390 Urinary tract infection, site not specified: Secondary | ICD-10-CM | POA: Diagnosis not present

## 2021-10-31 ENCOUNTER — Encounter: Payer: Self-pay | Admitting: Legal Medicine

## 2021-10-31 ENCOUNTER — Telehealth: Payer: Self-pay

## 2021-10-31 DIAGNOSIS — R102 Pelvic and perineal pain: Secondary | ICD-10-CM | POA: Diagnosis not present

## 2021-10-31 NOTE — Telephone Encounter (Signed)
[name] Physical therapist called to report blood pressure 92/60. Patient does not have any symptoms. Dr Henrene Pastor recommended to drink more fluids. ?

## 2021-10-31 NOTE — Telephone Encounter (Signed)
We will start antibiotic as soon as we get culture results ?lp ?

## 2021-11-01 ENCOUNTER — Other Ambulatory Visit: Payer: Self-pay

## 2021-11-01 MED ORDER — NITROFURANTOIN MONOHYD MACRO 100 MG PO CAPS: 100.0000 mg | ORAL_CAPSULE | Freq: Two times a day (BID) | ORAL | 0 refills | Status: DC

## 2021-11-01 NOTE — Telephone Encounter (Signed)
E.Coli sensitive to everything, start on macrobid 100mg  bid for 7 days ?p ?

## 2021-11-04 ENCOUNTER — Encounter: Payer: Self-pay | Admitting: Legal Medicine

## 2021-11-06 ENCOUNTER — Telehealth: Payer: Self-pay

## 2021-11-06 NOTE — Telephone Encounter (Signed)
[name] called to report Frank blood pressure was 92/60 today. After PT she went to answer the phone and didn't line up right to sit back down and slide out her chair. Tobi Bastos and [place] did some walking and exercises and she only stated she was sore nothing else. If we have any questions or concerns Tobi Bastos said to give her a call at [phone]. ?

## 2021-11-10 DIAGNOSIS — W19XXXA Unspecified fall, initial encounter: Secondary | ICD-10-CM | POA: Diagnosis not present

## 2021-11-10 DIAGNOSIS — S51811A Laceration without foreign body of right forearm, initial encounter: Secondary | ICD-10-CM | POA: Diagnosis not present

## 2021-11-10 DIAGNOSIS — S20212A Contusion of left front wall of thorax, initial encounter: Secondary | ICD-10-CM | POA: Diagnosis not present

## 2021-11-11 ENCOUNTER — Ambulatory Visit (INDEPENDENT_AMBULATORY_CARE_PROVIDER_SITE_OTHER): Payer: PPO | Admitting: Legal Medicine

## 2021-11-11 ENCOUNTER — Encounter: Payer: Self-pay | Admitting: Legal Medicine

## 2021-11-11 ENCOUNTER — Other Ambulatory Visit: Payer: Self-pay

## 2021-11-11 VITALS — BP 110/60 | HR 74 | Temp 98.7°F | Resp 15 | Ht 63.0 in | Wt 102.0 lb

## 2021-11-11 DIAGNOSIS — N3000 Acute cystitis without hematuria: Secondary | ICD-10-CM | POA: Diagnosis not present

## 2021-11-11 DIAGNOSIS — G309 Alzheimer's disease, unspecified: Secondary | ICD-10-CM

## 2021-11-11 DIAGNOSIS — F0280 Dementia in other diseases classified elsewhere without behavioral disturbance: Secondary | ICD-10-CM | POA: Diagnosis not present

## 2021-11-11 DIAGNOSIS — G319 Degenerative disease of nervous system, unspecified: Secondary | ICD-10-CM | POA: Diagnosis not present

## 2021-11-11 DIAGNOSIS — R296 Repeated falls: Secondary | ICD-10-CM

## 2021-11-11 LAB — POCT URINALYSIS DIP (CLINITEK)
Bilirubin, UA: NEGATIVE
Blood, UA: NEGATIVE
Glucose, UA: NEGATIVE mg/dL
Ketones, POC UA: NEGATIVE mg/dL
Leukocytes, UA: NEGATIVE
Nitrite, UA: NEGATIVE
Spec Grav, UA: 1.02 (ref 1.010–1.025)
Urobilinogen, UA: 0.2 E.U./dL
pH, UA: 6 (ref 5.0–8.0)

## 2021-11-11 NOTE — Progress Notes (Signed)
Subjective:  Patient ID: [name], female    DOB: [birth year]  Age: 88 y.o. MRN: [id number]  Chief Complaint  Patient presents with   Back Pain   Balance problem   Urinary Tract Infection    HPI: follow up   Patient is here for Back pain after she fell couples week ago. She also noticed that urine is cloudy, and bad odor. He daughter said that her loss of balance is getting worse and she is concerned that patient can have another fall.UTI has cleared.  She fell at night going to bathroom Saturday.  She went to urgent care. She lives at home has caregivers 9AM to 9PM.  She has walker.  Patient has DAT and having trouble living at her home, even with assistance.  Current Outpatient Medications on File Prior to Visit  Medication Sig Dispense Refill   acebutolol (SECTRAL) 200 MG capsule Take 1 capsule (200 mg total) by mouth daily. Open capsule and give with apple sauce 90 capsule 3   apixaban (ELIQUIS) 2.5 MG TABS tablet TAKE 1 TABLET(2.5 MG) BY MOUTH TWICE DAILY 180 tablet 1   atorvastatin (LIPITOR) 40 MG tablet TAKE 1 TABLET(40 MG) BY MOUTH DAILY 90 tablet 2   Cholecalciferol (VITAMIN D3) 1000 units CAPS Take 1,000 Units by mouth daily.     divalproex (DEPAKOTE ER) 500 MG 24 hr tablet TAKE 1 TABLET(500 MG) BY MOUTH AT BEDTIME 30 tablet 1   docusate sodium (COLACE) 100 MG capsule Take 100 mg by mouth 2 (two) times daily.     donepezil (ARICEPT) 5 MG tablet Take 1 tablet ( 5 mg ) daily (Patient taking differently: 10 mg. Take 1 tablet po qd) 30 tablet 11   lactulose (CHRONULAC) 10 GM/15ML solution Take 15 mLs (10 g total) by mouth daily. 236 mL 3   Lidocaine (HM LIDOCAINE PATCH) 4 % PTCH Apply 1 each topically as needed. 30 patch 2   melatonin 1 MG TABS tablet Take 1 mg by mouth at bedtime as needed (SLEEP).     mirtazapine (REMERON) 30 MG tablet TAKE 1 TABLET(30 MG) BY MOUTH AT BEDTIME 90 tablet 1   Multiple Vitamins-Minerals (PRESERVISION AREDS 2) CAPS Take by mouth.     OVER THE  COUNTER MEDICATION Take 1 tablet by mouth daily. Selenex GSH supplement     prednisoLONE acetate (PRED FORTE) 1 % ophthalmic suspension      vitamin B-12 (CYANOCOBALAMIN) 500 MCG tablet Take 500 mcg by mouth daily.      No current facility-administered medications on file prior to visit.   Past Medical History:  Diagnosis Date   Acute ischemic stroke 11/08/2018   left postcentral gyri; left frontal   Adjustment disorder with depressed mood 10/16/2019   Alteration consciousness 03/08/2020   Apraxia following cerebrovascular accident 10/16/2019   B12 deficiency 10/16/2019   Bilateral impacted cerumen 04/02/2021   Body mass index (BMI) less than 19 02/16/2020   Chronic kidney disease, stage II (mild) 12/06/2019   Closed fracture of one rib of right side with routine healing 06/30/2017   Confusion    COPD (chronic obstructive pulmonary disease) 11/16/2017   Current use of long term anticoagulation 07/21/2017   Essential hypertension, benign 10/16/2019   First degree atrioventricular block 06/26/2017   Gait abnormality 09/24/2020   Hammer toe 06/26/2017   Hearing loss    Hyperlipidemia 06/26/2017   Laceration of right lower leg 06/18/2020   Major neurocognitive disorder due to Alzheimer's disease 08/16/2021   Malnutrition  of moderate degree 12/06/2019   Memory loss    Other specified cardiac arrhythmias 06/26/2017   Pain in limb 06/26/2017   Palpitations 06/26/2017   Paroxysmal atrial fibrillation 06/26/2017   remote history post operatively, CHADS 2 score=2   Premature atrial complex 06/26/2017   Premature ventricular contractions 06/26/2017   PSVT (paroxysmal supraventricular tachycardia) 11/10/2017   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 10/16/2019   Thoracic aorta atherosclerosis 05/22/2020   Traumatic pneumothorax 06/30/2017   Past Surgical History:  Procedure Laterality Date   ABDOMINAL HYSTERECTOMY     SHOULDER SURGERY     TONSILLECTOMY      Family History   Problem Relation Age of Onset   Bone cancer Mother    Breast cancer Father    Diabetes Father    Heart disease Father    Dementia Sister    Breast cancer Sister    Alzheimer's disease Sister    Dementia Brother    Social History   Socioeconomic History   Marital status: Widowed    Spouse name: Not on file   Number of children: 2   Years of education: 12   Highest education level: High school graduate  Occupational History   Occupation: Retired  Tobacco Use   Smoking status: Never   Smokeless tobacco: Never  Vaping Use   Vaping Use: Never used  Substance and Sexual Activity   Alcohol use: No   Drug use: No   Sexual activity: Not Currently  Other Topics Concern   Not on file  Social History Narrative   [name] is widowed. Her daughter, [name], is her POA and lives 12 miles away. She resides in her own home, alone for last 7 years. Since her CVA she has had someone with her most of the time. She has a hired caregiver or a family member stays most of the day. She feel safe in her home and in her neighborhood.   Right-handed.   0.5 cups caffeine per day.   Social Determinants of Health   Financial Resource Strain: Not on file  Food Insecurity: Not on file  Transportation Needs: Not on file  Physical Activity: Not on file  Stress: Not on file  Social Connections: Not on file    Review of Systems  Constitutional:  Negative for chills, fatigue and fever.  HENT:  Negative for congestion, ear pain and sore throat.   Respiratory:  Negative for cough and shortness of breath.   Cardiovascular:  Negative for chest pain and palpitations.  Gastrointestinal:  Negative for abdominal pain, constipation, diarrhea, nausea and vomiting.  Endocrine: Negative for polydipsia, polyphagia and polyuria.  Genitourinary:  Negative for difficulty urinating and dysuria.  Musculoskeletal:  Positive for back pain. Negative for arthralgias and myalgias.  Skin:  Positive for wound.  Negative for rash.  Neurological:  Positive for weakness. Negative for headaches.  Psychiatric/Behavioral:  Positive for confusion. Negative for dysphoric mood. The patient is not nervous/anxious.     Objective:  BP 110/60    Pulse 74    Temp 98.7 F (37.1 C)    Resp 15    Ht 5\' 3"  (1.6 m)    Wt 102 lb (46.3 kg)    LMP  (LMP Unknown)    SpO2 99%    BMI 18.07 kg/m   BP/Weight 11/11/2021 10/29/2021 09/20/2021  Systolic BP 110 102 100  Diastolic BP 60 70 68  Wt. (Lbs) 102 99.8 101  BMI 18.07 17.68 17.89    Physical  Exam Vitals reviewed.  Constitutional:      General: She is not in acute distress.    Appearance: Normal appearance.  HENT:     Head: Normocephalic.     Right Ear: Tympanic membrane normal.     Left Ear: Tympanic membrane normal.     Nose: Nose normal.     Mouth/Throat:     Mouth: Mucous membranes are moist.     Pharynx: Oropharynx is clear.  Eyes:     Extraocular Movements: Extraocular movements intact.     Conjunctiva/sclera: Conjunctivae normal.     Pupils: Pupils are equal, round, and reactive to light.  Cardiovascular:     Rate and Rhythm: Normal rate and regular rhythm.     Pulses: Normal pulses.     Heart sounds: Normal heart sounds. No murmur heard.   No gallop.  Pulmonary:     Effort: Pulmonary effort is normal. No respiratory distress.     Breath sounds: Normal breath sounds. No wheezing.  Abdominal:     General: Abdomen is flat. Bowel sounds are normal. There is no distension.     Tenderness: There is no abdominal tenderness.  Musculoskeletal:     Cervical back: Normal range of motion.     Right lower leg: No edema.     Left lower leg: No edema.  Skin:    General: Skin is warm and dry.     Findings: Lesion present.     Comments: Laceration right forearm 5mm by 4 cm.  Neurological:     General: No focal deficit present.     Mental Status: She is alert. She is disoriented.     Motor: Weakness present.     Gait: Gait abnormal.     Comments:  Multiple falls, unstable romberg, truncal instability, cerebral atrophy  Psychiatric:        Mood and Affect: Mood normal.        Thought Content: Thought content normal.        Judgment: Judgment normal.        Lab Results  Component Value Date   WBC 5.7 09/20/2021   HGB 11.9 09/20/2021   HCT 35.1 09/20/2021   PLT 315 09/20/2021   GLUCOSE 75 09/20/2021   CHOL 112 03/18/2021   TRIG 78 03/18/2021   HDL 46 03/18/2021   LDLCALC 50 03/18/2021   ALT 8 09/20/2021   AST 15 09/20/2021   NA 140 09/20/2021   K 5.2 09/20/2021   CL 100 09/20/2021   CREATININE 0.73 09/20/2021   BUN 16 09/20/2021   CO2 27 09/20/2021   TSH 1.460 05/22/2020   HGBA1C 5.3 11/09/2018      Assessment & Plan:   Problem List Items Addressed This Visit       Nervous and Auditory   Major neurocognitive disorder due to Alzheimer's disease Patient has DAT with poor memory and needs increasing supervision    Cerebral atrophy (HCC) MRI shows cerebral atrophy     Other   Falls frequently Increased falls due to stroke, ischemic brain disease, trunchal instability and generalized weakness, she will need balance treatment, may need NH placement   Other Visit Diagnoses     Acute cystitis without hematuria    -  Primary   Relevant Orders   POCT URINALYSIS DIP (CLINITEK) (Completed) UTI has resolved.     .    Orders Placed This Encounter  Procedures   POCT URINALYSIS DIP (CLINITEK)     Follow-up: Return in about  1 week (around 11/18/2021) for laceration.  An After Visit Summary was printed and given to the patient.  Brent BullaLawrence Malkia Nippert, MD [facility] [phone]

## 2021-11-15 ENCOUNTER — Encounter: Payer: Self-pay | Admitting: Legal Medicine

## 2021-11-18 ENCOUNTER — Ambulatory Visit (INDEPENDENT_AMBULATORY_CARE_PROVIDER_SITE_OTHER): Payer: PPO | Admitting: Legal Medicine

## 2021-11-18 ENCOUNTER — Encounter: Payer: Self-pay | Admitting: Legal Medicine

## 2021-11-18 VITALS — BP 120/80 | HR 91 | Resp 15 | Wt 99.0 lb

## 2021-11-18 DIAGNOSIS — M545 Low back pain, unspecified: Secondary | ICD-10-CM | POA: Diagnosis not present

## 2021-11-18 DIAGNOSIS — R296 Repeated falls: Secondary | ICD-10-CM

## 2021-11-18 DIAGNOSIS — M898X1 Other specified disorders of bone, shoulder: Secondary | ICD-10-CM

## 2021-11-18 DIAGNOSIS — S32599G Other specified fracture of unspecified pubis, subsequent encounter for fracture with delayed healing: Secondary | ICD-10-CM

## 2021-11-18 NOTE — Progress Notes (Signed)
Subjective:  Patient ID: [name], female    DOB: 10/08/1925  Age: 88 y.o. MRN: [id number]  Chief Complaint  Patient presents with   Back Pain    HPI Patient was seen on 11/11/2021 for Frequently falls, and back pain. She noticed that back pain left scapula is getting worse and Tylenol is not working anymore. Her daughter's mentioned she hurt her head when she fell 2 weeks ago. She is using aspercream but family did not give her the tramadol. Current Outpatient Medications on File Prior to Visit  Medication Sig Dispense Refill   acebutolol (SECTRAL) 200 MG capsule Take 1 capsule (200 mg total) by mouth daily. Open capsule and give with apple sauce 90 capsule 3   apixaban (ELIQUIS) 2.5 MG TABS tablet TAKE 1 TABLET(2.5 MG) BY MOUTH TWICE DAILY 180 tablet 1   atorvastatin (LIPITOR) 40 MG tablet TAKE 1 TABLET(40 MG) BY MOUTH DAILY 90 tablet 2   Cholecalciferol (VITAMIN D3) 1000 units CAPS Take 1,000 Units by mouth daily.     divalproex (DEPAKOTE ER) 500 MG 24 hr tablet TAKE 1 TABLET(500 MG) BY MOUTH AT BEDTIME 30 tablet 1   docusate sodium (COLACE) 100 MG capsule Take 100 mg by mouth 2 (two) times daily.     donepezil (ARICEPT) 5 MG tablet Take 1 tablet ( 5 mg ) daily (Patient taking differently: 10 mg. Take 1 tablet po qd) 30 tablet 11   lactulose (CHRONULAC) 10 GM/15ML solution Take 15 mLs (10 g total) by mouth daily. 236 mL 3   Lidocaine (HM LIDOCAINE PATCH) 4 % PTCH Apply 1 each topically as needed. 30 patch 2   melatonin 1 MG TABS tablet Take 1 mg by mouth at bedtime as needed (SLEEP).     mirtazapine (REMERON) 30 MG tablet TAKE 1 TABLET(30 MG) BY MOUTH AT BEDTIME 90 tablet 1   Multiple Vitamins-Minerals (PRESERVISION AREDS 2) CAPS Take by mouth.     OVER THE COUNTER MEDICATION Take 1 tablet by mouth daily. Selenex GSH supplement     prednisoLONE acetate (PRED FORTE) 1 % ophthalmic suspension      vitamin B-12 (CYANOCOBALAMIN) 500 MCG tablet Take 500 mcg by mouth daily.      No  current facility-administered medications on file prior to visit.   Past Medical History:  Diagnosis Date   Acute ischemic stroke 11/08/2018   left postcentral gyri; left frontal   Adjustment disorder with depressed mood 10/16/2019   Alteration consciousness 03/08/2020   Apraxia following cerebrovascular accident 10/16/2019   B12 deficiency 10/16/2019   Bilateral impacted cerumen 04/02/2021   Body mass index (BMI) less than 19 02/16/2020   Chronic kidney disease, stage II (mild) 12/06/2019   Closed fracture of one rib of right side with routine healing 06/30/2017   Confusion    COPD (chronic obstructive pulmonary disease) 11/16/2017   Current use of long term anticoagulation 07/21/2017   Essential hypertension, benign 10/16/2019   First degree atrioventricular block 06/26/2017   Gait abnormality 09/24/2020   Hammer toe 06/26/2017   Hearing loss    Hyperlipidemia 06/26/2017   Laceration of right lower leg 06/18/2020   Major neurocognitive disorder due to Alzheimer's disease 08/16/2021   Malnutrition of moderate degree 12/06/2019   Memory loss    Other specified cardiac arrhythmias 06/26/2017   Pain in limb 06/26/2017   Palpitations 06/26/2017   Paroxysmal atrial fibrillation 06/26/2017   remote history post operatively, CHADS 2 score=2   Premature atrial complex 06/26/2017  Premature ventricular contractions 06/26/2017   PSVT (paroxysmal supraventricular tachycardia) 11/10/2017   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 10/16/2019   Thoracic aorta atherosclerosis 05/22/2020   Traumatic pneumothorax 06/30/2017   Past Surgical History:  Procedure Laterality Date   ABDOMINAL HYSTERECTOMY     SHOULDER SURGERY     TONSILLECTOMY      Family History  Problem Relation Age of Onset   Bone cancer Mother    Breast cancer Father    Diabetes Father    Heart disease Father    Dementia Sister    Breast cancer Sister    Alzheimer's disease Sister    Dementia Brother     Social History   Socioeconomic History   Marital status: Widowed    Spouse name: Not on file   Number of children: 2   Years of education: 12   Highest education level: High school graduate  Occupational History   Occupation: Retired  Tobacco Use   Smoking status: Never   Smokeless tobacco: Never  Vaping Use   Vaping Use: Never used  Substance and Sexual Activity   Alcohol use: No   Drug use: No   Sexual activity: Not Currently  Other Topics Concern   Not on file  Social History Narrative   [name] is widowed. Her daughter, [name], is her POA and lives 12 miles away. She resides in her own home, alone for last 7 years. Since her CVA she has had someone with her most of the time. She has a hired caregiver or a family member stays most of the day. She feel safe in her home and in her neighborhood.   Right-handed.   0.5 cups caffeine per day.   Social Determinants of Health   Financial Resource Strain: Not on file  Food Insecurity: Not on file  Transportation Needs: Not on file  Physical Activity: Not on file  Stress: Not on file  Social Connections: Not on file    Review of Systems  Constitutional:  Negative for chills, fatigue and fever.  HENT:  Negative for congestion, ear pain and sore throat.   Respiratory:  Negative for cough and shortness of breath.   Cardiovascular:  Negative for chest pain and palpitations.  Gastrointestinal:  Negative for abdominal pain, constipation, diarrhea, nausea and vomiting.  Endocrine: Negative for polydipsia, polyphagia and polyuria.  Genitourinary:  Negative for difficulty urinating and dysuria.  Musculoskeletal:  Positive for back pain. Negative for arthralgias and myalgias.  Skin:  Negative for rash.  Neurological:  Negative for headaches.  Psychiatric/Behavioral:  Negative for dysphoric mood. The patient is not nervous/anxious.     Objective:  BP 120/80   Pulse 91   Resp 15   Wt 99 lb (44.9 kg)   LMP  (LMP  Unknown)   SpO2 100%   BMI 17.54 kg/m   BP/Weight 11/18/2021 11/11/2021 10/29/2021  Systolic BP 120 110 102  Diastolic BP 80 60 70  Wt. (Lbs) 99 102 99.8  BMI 17.54 18.07 17.68    Physical Exam Vitals reviewed.  Constitutional:      General: She is not in acute distress.    Appearance: Normal appearance.  HENT:     Right Ear: Tympanic membrane normal.     Left Ear: Tympanic membrane normal.  Cardiovascular:     Rate and Rhythm: Normal rate and regular rhythm.     Pulses: Normal pulses.     Heart sounds: Normal heart sounds. No murmur heard.   No gallop.  Pulmonary:     Effort: Pulmonary effort is normal. No respiratory distress.     Breath sounds: No wheezing.  Abdominal:     General: Abdomen is flat. Bowel sounds are normal.  Musculoskeletal:       Arms:     Cervical back: Normal range of motion.     Comments: Pain left scapula  Neurological:     Mental Status: She is alert.        Lab Results  Component Value Date   WBC 5.7 09/20/2021   HGB 11.9 09/20/2021   HCT 35.1 09/20/2021   PLT 315 09/20/2021   GLUCOSE 75 09/20/2021   CHOL 112 03/18/2021   TRIG 78 03/18/2021   HDL 46 03/18/2021   LDLCALC 50 03/18/2021   ALT 8 09/20/2021   AST 15 09/20/2021   NA 140 09/20/2021   K 5.2 09/20/2021   CL 100 09/20/2021   CREATININE 0.73 09/20/2021   BUN 16 09/20/2021   CO2 27 09/20/2021   TSH 1.460 05/22/2020   HGBA1C 5.3 11/09/2018      Assessment & Plan:   Problem List Items Addressed This Visit       Other   Back pain Back pain from multiple falls and osteoporosis    Falls frequently - Primary   Relevant Orders   Ambulatory referral to Physical Therapy Trunckal instability , needs further therapy   Other Visit Diagnoses     Pain of left scapula       Relevant Orders   Ambulatory referral to Physical Therapy Continue with physical  therapy   Closed fracture of inferior pubic ramus, unspecified laterality, with delayed healing, subsequent  encounter       Relevant Orders   Ambulatory referral to Physical Therapy Last x-ry demonstrates she still has pubic rami fracture but not painful     .    Orders Placed This Encounter  Procedures   Ambulatory referral to Physical Therapy     Follow-up: Return as scheduled.  An After Visit Summary was printed and given to the patient.  Brent Bulla, MD [facility] [phone]

## 2021-11-19 ENCOUNTER — Encounter: Payer: Self-pay | Admitting: Legal Medicine

## 2021-11-19 ENCOUNTER — Ambulatory Visit: Payer: PPO | Admitting: Cardiology

## 2021-11-19 NOTE — Telephone Encounter (Signed)
Try 1/2 tramadol or we can get orthopedics to check this shoulder ?p ?

## 2021-11-25 ENCOUNTER — Encounter: Payer: Self-pay | Admitting: Legal Medicine

## 2021-11-25 NOTE — Telephone Encounter (Signed)
This is a progression of disease of dementia.  Any evidence for infection ?lp ?

## 2021-12-02 NOTE — Telephone Encounter (Signed)
Spoke with Dr. Delice Lesch, she agreed to see patient in follow up. Please let front desk know to schedule this patient, video or in person as follow up with Dr. Delice Lesch only from now on. Thanks

## 2021-12-03 DIAGNOSIS — R293 Abnormal posture: Secondary | ICD-10-CM | POA: Diagnosis not present

## 2021-12-03 DIAGNOSIS — M6281 Muscle weakness (generalized): Secondary | ICD-10-CM | POA: Diagnosis not present

## 2021-12-03 DIAGNOSIS — R2689 Other abnormalities of gait and mobility: Secondary | ICD-10-CM | POA: Diagnosis not present

## 2021-12-10 DIAGNOSIS — M6281 Muscle weakness (generalized): Secondary | ICD-10-CM | POA: Diagnosis not present

## 2021-12-10 DIAGNOSIS — R293 Abnormal posture: Secondary | ICD-10-CM | POA: Diagnosis not present

## 2021-12-10 DIAGNOSIS — R2689 Other abnormalities of gait and mobility: Secondary | ICD-10-CM | POA: Diagnosis not present

## 2021-12-13 ENCOUNTER — Telehealth: Payer: Self-pay

## 2021-12-13 DIAGNOSIS — R197 Diarrhea, unspecified: Secondary | ICD-10-CM | POA: Diagnosis not present

## 2021-12-13 NOTE — Telephone Encounter (Signed)
[name] called to report that her Mother is not feeling better today.  She continues to have diarrhea with no improvement with the Imodium.  She was instructed to take her Mom to the hospital for evaluation and treatment.   ?

## 2021-12-17 ENCOUNTER — Encounter: Payer: Self-pay | Admitting: Legal Medicine

## 2021-12-17 DIAGNOSIS — M6281 Muscle weakness (generalized): Secondary | ICD-10-CM | POA: Diagnosis not present

## 2021-12-17 DIAGNOSIS — R293 Abnormal posture: Secondary | ICD-10-CM | POA: Diagnosis not present

## 2021-12-17 DIAGNOSIS — R2689 Other abnormalities of gait and mobility: Secondary | ICD-10-CM | POA: Diagnosis not present

## 2021-12-18 ENCOUNTER — Ambulatory Visit: Payer: PPO | Admitting: Cardiology

## 2021-12-18 ENCOUNTER — Encounter: Payer: Self-pay | Admitting: Cardiology

## 2021-12-18 VITALS — BP 108/72 | HR 80 | Ht 63.0 in | Wt 99.6 lb

## 2021-12-18 DIAGNOSIS — I480 Paroxysmal atrial fibrillation: Secondary | ICD-10-CM

## 2021-12-18 DIAGNOSIS — E7800 Pure hypercholesterolemia, unspecified: Secondary | ICD-10-CM

## 2021-12-18 DIAGNOSIS — G903 Multi-system degeneration of the autonomic nervous system: Secondary | ICD-10-CM | POA: Diagnosis not present

## 2021-12-18 DIAGNOSIS — I471 Supraventricular tachycardia: Secondary | ICD-10-CM

## 2021-12-18 DIAGNOSIS — R296 Repeated falls: Secondary | ICD-10-CM

## 2021-12-18 DIAGNOSIS — Z7901 Long term (current) use of anticoagulants: Secondary | ICD-10-CM | POA: Diagnosis not present

## 2021-12-18 MED ORDER — MIDODRINE HCL 5 MG PO TABS: 5.0000 mg | ORAL_TABLET | Freq: Two times a day (BID) | ORAL | 3 refills | Status: DC

## 2021-12-18 MED ORDER — ACEBUTOLOL HCL 200 MG PO CAPS
200.0000 mg | ORAL_CAPSULE | ORAL | 3 refills | Status: DC
Start: 2021-12-18 — End: 2022-02-17

## 2021-12-18 NOTE — Patient Instructions (Signed)
Medication Instructions:  ?Your physician has recommended you make the following change in your medication:  ? ?START: Sectral 200mg  every other day ?START: Midodrine 5 mg twice daily ? ?*If you need a refill on your cardiac medications before your next appointment, please call your pharmacy* ? ? ?Lab Work: ?None ?If you have labs (blood work) drawn today and your tests are completely normal, you will receive your results only by: ?MyChart Message (if you have MyChart) OR ?A paper copy in the mail ?If you have any lab test that is abnormal or we need to change your treatment, we will call you to review the results. ? ? ?Testing/Procedures: ?None ? ? ?Follow-Up: ?At [facility], you and your health needs are our priority.  As part of our continuing mission to provide you with exceptional heart care, we have created designated Provider Care Teams.  These Care Teams include your primary Cardiologist (physician) and Advanced Practice Providers (APPs -  Physician Assistants and Nurse Practitioners) who all work together to provide you with the care you need, when you need it. ? ?We recommend signing up for the patient portal called "MyChart".  Sign up information is provided on this After Visit Summary.  MyChart is used to connect with patients for Virtual Visits (Telemedicine).  Patients are able to view lab/test results, encounter notes, upcoming appointments, etc.  Non-urgent messages can be sent to your provider as well.   ?To learn more about what you can do with MyChart, go to [facility].   ? ?Your next appointment:   ?6 month(s) ? ?The format for your next appointment:   ?In Person ? ?Provider:   ?[email or web address], MD  ? ? ?Other Instructions ?None ? ?Important Information About Sugar ? ? ? ? ? ? ?

## 2021-12-18 NOTE — Progress Notes (Signed)
?Cardiology Office Note:   ? ?Date:  12/18/2021  ? ?ID:  Katherine HurdleDoris S Singh, DOB 11/03/1925, MRN [id number] ? ?PCP:  Abigail MiyamotoPerry, Lawrence Edward, MD  ?Cardiologist:  Norman HerrlichBrian Brittish Bolinger, MD   ? ?Referring MD: Abigail MiyamotoPerry, Lawrence Edward,*  ? ? ?ASSESSMENT:   ? ?1. Neurogenic orthostatic hypotension (HCC)   ?2. Paroxysmal atrial fibrillation (HCC)   ?3. PAT (paroxysmal atrial tachycardia) (HCC)   ?4. Current use of long term anticoagulation   ?5. Pure hypercholesterolemia   ?6. Falls frequently   ? ?PLAN:   ? ?In order of problems listed above: ? ?Unfortunately hypotension is common and progressive degenerative neurologic illness and I think it may well be the reason for her falls.  I will reduce her beta-blocker to every other day and place her on alpha agonist midodrine and her daughter will continue to trend blood pressure. ?Stable no clinical recurrence of her atrial tachyarrhythmia ?She will continue her low-dose anticoagulation with age and body mass ?Continue her statin with a history of stroke ? ? ?Next appointment: 6 months ? ? ?Medication Adjustments/Labs and Tests Ordered: ?Current medicines are reviewed at length with the patient today.  Concerns regarding medicines are outlined above.  ?No orders of the defined types were placed in this encounter. ? ?No orders of the defined types were placed in this encounter. ? ? ?Chief Complaint  ?Patient presents with  ? Follow-up  ? Atrial Fibrillation  ? Anticoagulation  ? ? ?History of Present Illness:   ? ?Katherine HurdleDoris S Singh is a 88 y.o. female with a hx of  paroxysmal atrial fibrillation and paroxysmal atrial tachycardia with stroke in March 2020 and long-term anticoagulation with Xarelto and hyperlipidemia  last seen 05/14/2021. ?Compliance with diet, lifestyle and medications: Yes ?Her daughter is present very involved and supervises her care ?Her mother is increasingly weak and has had multiple falls. ?Often hypotensive at home with blood pressures less than 80 systolic ?She has had  no recurrence of rapid heart rhythm chest pain shortness of breath or edema ?Past Medical History:  ?Diagnosis Date  ? Acute ischemic stroke 11/08/2018  ? left postcentral gyri; left frontal  ? Adjustment disorder with depressed mood 10/16/2019  ? Alteration consciousness 03/08/2020  ? Apraxia following cerebrovascular accident 10/16/2019  ? B12 deficiency 10/16/2019  ? Bilateral impacted cerumen 04/02/2021  ? Body mass index (BMI) less than 19 02/16/2020  ? Chronic kidney disease, stage II (mild) 12/06/2019  ? Closed fracture of one rib of right side with routine healing 06/30/2017  ? Confusion   ? COPD (chronic obstructive pulmonary disease) 11/16/2017  ? Current use of long term anticoagulation 07/21/2017  ? Essential hypertension, benign 10/16/2019  ? First degree atrioventricular block 06/26/2017  ? Gait abnormality 09/24/2020  ? Hammer toe 06/26/2017  ? Hearing loss   ? Hyperlipidemia 06/26/2017  ? Laceration of right lower leg 06/18/2020  ? Major neurocognitive disorder due to Alzheimer's disease 08/16/2021  ? Malnutrition of moderate degree 12/06/2019  ? Memory loss   ? Other specified cardiac arrhythmias 06/26/2017  ? Pain in limb 06/26/2017  ? Palpitations 06/26/2017  ? Paroxysmal atrial fibrillation 06/26/2017  ? remote history post operatively, CHADS 2 score=2  ? Premature atrial complex 06/26/2017  ? Premature ventricular contractions 06/26/2017  ? PSVT (paroxysmal supraventricular tachycardia) 11/10/2017  ? 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 Thoracic aorta atherosclerosis 05/22/2020  ? Traumatic pneumothorax 06/30/2017  ? ? ?Past Surgical History:  ?Procedure Laterality Date  ? ABDOMINAL HYSTERECTOMY    ?  SHOULDER SURGERY    ? TONSILLECTOMY    ? ? ?Current Medications: ?Current Meds  ?Medication Sig  ? acebutolol (SECTRAL) 200 MG capsule Take 1 capsule (200 mg total) by mouth daily. Open capsule and give with apple sauce  ? apixaban (ELIQUIS) 2.5 MG TABS tablet TAKE 1  TABLET(2.5 MG) BY MOUTH TWICE DAILY  ? atorvastatin (LIPITOR) 40 MG tablet TAKE 1 TABLET(40 MG) BY MOUTH DAILY  ? Cholecalciferol (VITAMIN D3) 1000 units CAPS Take 1,000 Units by mouth daily.  ? divalproex (DEPAKOTE ER) 500 MG 24 hr tablet TAKE 1 TABLET(500 MG) BY MOUTH AT BEDTIME  ? donepezil (ARICEPT) 5 MG tablet Take 1 tablet ( 5 mg ) daily (Patient taking differently: 10 mg. Take 1 tablet po qd)  ? melatonin 1 MG TABS tablet Take 1 mg by mouth at bedtime as needed (SLEEP).  ? mirtazapine (REMERON) 30 MG tablet TAKE 1 TABLET(30 MG) BY MOUTH AT BEDTIME  ? vitamin B-12 (CYANOCOBALAMIN) 500 MCG tablet Take 500 mcg by mouth daily.   ?  ? ?Allergies:   Codeine, Penicillins, and Sulfa antibiotics  ? ?Social History  ? ?Socioeconomic History  ? Marital status: Widowed  ?  Spouse name: Not on file  ? Number of children: 2  ? Years of education: 56  ? Highest education level: High school graduate  ?Occupational History  ? Occupation: Retired  ?Tobacco Use  ? Smoking status: Never  ?  Passive exposure: Past  ? Smokeless tobacco: Never  ?Vaping Use  ? Vaping Use: Never used  ?Substance and Sexual Activity  ? Alcohol use: No  ? Drug use: No  ? Sexual activity: Not Currently  ?Other Topics Concern  ? Not on file  ?Social History Narrative  ? [name] is widowed. Her daughter, [name], is her POA and lives 12 miles away. She resides in her own home, alone for last 7 years. Since her CVA she has had someone with her most of the time. She has a hired caregiver or a family member stays most of the day. She feel safe in her home and in her neighborhood.  ? Right-handed.  ? 0.5 cups caffeine per day.  ? ?Social Determinants of Health  ? ?Financial Resource Strain: Not on file  ?Food Insecurity: Not on file  ?Transportation Needs: Not on file  ?Physical Activity: Not on file  ?Stress: Not on file  ?Social Connections: Not on file  ?  ? ?Family History: ?The patient's family history includes Alzheimer's disease in her  sister; Bone cancer in her mother; Breast cancer in her father and sister; Dementia in her brother and sister; Diabetes in her father; Heart disease in her father. ?ROS:   ?Please see the history of present illness.    ?All other systems reviewed and are negative. ? ?EKGs/Labs/Other Studies Reviewed:   ? ?The following studies were reviewed today: ? ? ? ?Recent Labs: ?09/20/2021: ALT 8; BUN 16; Creatinine, Ser 0.73; Hemoglobin 11.9; Platelets 315; Potassium 5.2; Sodium 140  ?Recent Lipid Panel ?   ?Component Value Date/Time  ? CHOL 112 03/18/2021 1045  ? TRIG 78 03/18/2021 1045  ? HDL 46 03/18/2021 1045  ? CHOLHDL 2.4 03/18/2021 1045  ? CHOLHDL 4.3 11/09/2018 0512  ? VLDL 11 11/09/2018 0512  ? LDLCALC 50 03/18/2021 1045  ? ? ?Physical Exam:   ? ?VS:  BP 108/72 (BP Location: Right Arm)   Pulse 80   Ht 5\' 3"  (1.6 m)   Wt 99 lb  9.6 oz (45.2 kg)   LMP  (LMP Unknown)   SpO2 95%   BMI 17.64 kg/m?    ? ?Wt Readings from Last 3 Encounters:  ?12/18/21 99 lb 9.6 oz (45.2 kg)  ?11/18/21 99 lb (44.9 kg)  ?11/11/21 102 lb (46.3 kg)  ?  ? ?GEN: Very frail weight is less than 100 pounds in no acute distress ?HEENT: Normal ?NECK: No JVD; No carotid bruits ?LYMPHATICS: No lymphadenopathy ?CARDIAC: RRR, no murmurs, rubs, gallops ?RESPIRATORY:  Clear to auscultation without rales, wheezing or rhonchi  ?ABDOMEN: Soft, non-tender, non-distended ?MUSCULOSKELETAL:  No edema; No deformity  ?SKIN: Warm and dry ?NEUROLOGIC:  Alert and oriented x 3 ?PSYCHIATRIC:  Normal affect  ? ? ?Signed, ?Norman Herrlich, MD  ?12/18/2021 3:55 PM    ?[facility]  ?

## 2021-12-19 DIAGNOSIS — M6281 Muscle weakness (generalized): Secondary | ICD-10-CM | POA: Diagnosis not present

## 2021-12-19 DIAGNOSIS — R2689 Other abnormalities of gait and mobility: Secondary | ICD-10-CM | POA: Diagnosis not present

## 2021-12-19 DIAGNOSIS — R293 Abnormal posture: Secondary | ICD-10-CM | POA: Diagnosis not present

## 2021-12-20 DIAGNOSIS — Z7901 Long term (current) use of anticoagulants: Secondary | ICD-10-CM | POA: Diagnosis not present

## 2021-12-20 DIAGNOSIS — I480 Paroxysmal atrial fibrillation: Secondary | ICD-10-CM | POA: Diagnosis not present

## 2021-12-20 DIAGNOSIS — Z66 Do not resuscitate: Secondary | ICD-10-CM | POA: Diagnosis not present

## 2021-12-20 DIAGNOSIS — D692 Other nonthrombocytopenic purpura: Secondary | ICD-10-CM | POA: Diagnosis not present

## 2021-12-20 DIAGNOSIS — Z515 Encounter for palliative care: Secondary | ICD-10-CM | POA: Diagnosis not present

## 2021-12-20 DIAGNOSIS — F0390 Unspecified dementia without behavioral disturbance: Secondary | ICD-10-CM | POA: Diagnosis not present

## 2021-12-20 DIAGNOSIS — D6869 Other thrombophilia: Secondary | ICD-10-CM | POA: Diagnosis not present

## 2021-12-24 DIAGNOSIS — R2689 Other abnormalities of gait and mobility: Secondary | ICD-10-CM | POA: Diagnosis not present

## 2021-12-24 DIAGNOSIS — M6281 Muscle weakness (generalized): Secondary | ICD-10-CM | POA: Diagnosis not present

## 2021-12-24 DIAGNOSIS — R293 Abnormal posture: Secondary | ICD-10-CM | POA: Diagnosis not present

## 2021-12-26 ENCOUNTER — Encounter: Payer: Self-pay | Admitting: Legal Medicine

## 2021-12-26 ENCOUNTER — Other Ambulatory Visit: Payer: Self-pay | Admitting: Neurology

## 2021-12-26 DIAGNOSIS — R293 Abnormal posture: Secondary | ICD-10-CM | POA: Diagnosis not present

## 2021-12-26 DIAGNOSIS — R2689 Other abnormalities of gait and mobility: Secondary | ICD-10-CM | POA: Diagnosis not present

## 2021-12-26 DIAGNOSIS — M6281 Muscle weakness (generalized): Secondary | ICD-10-CM | POA: Diagnosis not present

## 2021-12-26 MED ORDER — DIVALPROEX SODIUM ER 500 MG PO TB24: ORAL_TABLET | ORAL | 1 refills | Status: DC

## 2021-12-31 ENCOUNTER — Telehealth: Payer: Self-pay | Admitting: Nurse Practitioner

## 2021-12-31 ENCOUNTER — Ambulatory Visit (INDEPENDENT_AMBULATORY_CARE_PROVIDER_SITE_OTHER): Payer: PPO | Admitting: Legal Medicine

## 2021-12-31 ENCOUNTER — Encounter: Payer: Self-pay | Admitting: Legal Medicine

## 2021-12-31 VITALS — BP 70/50 | HR 107 | Temp 99.0°F | Resp 15 | Ht 63.0 in | Wt 97.0 lb

## 2021-12-31 DIAGNOSIS — I4891 Unspecified atrial fibrillation: Secondary | ICD-10-CM | POA: Diagnosis not present

## 2021-12-31 DIAGNOSIS — F0280 Dementia in other diseases classified elsewhere without behavioral disturbance: Secondary | ICD-10-CM | POA: Diagnosis not present

## 2021-12-31 DIAGNOSIS — R58 Hemorrhage, not elsewhere classified: Secondary | ICD-10-CM | POA: Diagnosis not present

## 2021-12-31 DIAGNOSIS — J449 Chronic obstructive pulmonary disease, unspecified: Secondary | ICD-10-CM | POA: Diagnosis not present

## 2021-12-31 DIAGNOSIS — K921 Melena: Secondary | ICD-10-CM

## 2021-12-31 DIAGNOSIS — I959 Hypotension, unspecified: Secondary | ICD-10-CM

## 2021-12-31 DIAGNOSIS — G309 Alzheimer's disease, unspecified: Secondary | ICD-10-CM | POA: Diagnosis not present

## 2021-12-31 DIAGNOSIS — E7800 Pure hypercholesterolemia, unspecified: Secondary | ICD-10-CM | POA: Diagnosis not present

## 2021-12-31 DIAGNOSIS — D6869 Other thrombophilia: Secondary | ICD-10-CM

## 2021-12-31 DIAGNOSIS — Z8673 Personal history of transient ischemic attack (TIA), and cerebral infarction without residual deficits: Secondary | ICD-10-CM | POA: Diagnosis not present

## 2021-12-31 DIAGNOSIS — Z1211 Encounter for screening for malignant neoplasm of colon: Secondary | ICD-10-CM | POA: Diagnosis not present

## 2021-12-31 NOTE — Telephone Encounter (Signed)
Dr Eileen Stanford from [facility] ED telephoned after hours call phone. Reported pt is stable, hemoglobin 11, UA negative, orthostatic Bps in 120s. Plans to d/c pt home tonight to have possible GI bleed managed on outpatient basis. Telephoned Dr Rushie Nyhan, pt's PCP to relay information; states he tried to return Dr Theda Sers' call at ED but did not get an answer. Agrees with pt d/c home. ?

## 2021-12-31 NOTE — Progress Notes (Signed)
? ?Acute Office Visit ? ?Subjective:  ? ? Patient ID: [name], female    DOB: 1926/01/22, 88 y.o.   MRN: [id number] ? ?Chief Complaint  ?Patient presents with  ? Melena  ? ? ?HPI: ?Patient is in today for black stools. Home health nurse reported melena to patient's daughter. Patient's daughter wants to patient to be checked for any bleeding. He had loose BM 2 weeks ago and again in last 2 days. Lost 2 lbs in last month.  No abdominal pain. ? ?BP lower than normal.  In am 110/72. ? ?Past Medical History:  ?Diagnosis Date  ? Acute ischemic stroke 11/08/2018  ? left postcentral gyri; left frontal  ? Adjustment disorder with depressed mood 10/16/2019  ? Alteration consciousness 03/08/2020  ? Apraxia following cerebrovascular accident 10/16/2019  ? B12 deficiency 10/16/2019  ? Bilateral impacted cerumen 04/02/2021  ? Body mass index (BMI) less than 19 02/16/2020  ? Chronic kidney disease, stage II (mild) 12/06/2019  ? Closed fracture of one rib of right side with routine healing 06/30/2017  ? Confusion   ? COPD (chronic obstructive pulmonary disease) 11/16/2017  ? Current use of long term anticoagulation 07/21/2017  ? Essential hypertension, benign 10/16/2019  ? First degree atrioventricular block 06/26/2017  ? Gait abnormality 09/24/2020  ? Hammer toe 06/26/2017  ? Hearing loss   ? Hyperlipidemia 06/26/2017  ? Laceration of right lower leg 06/18/2020  ? Major neurocognitive disorder due to Alzheimer's disease 08/16/2021  ? Malnutrition of moderate degree 12/06/2019  ? Memory loss   ? Other specified cardiac arrhythmias 06/26/2017  ? Pain in limb 06/26/2017  ? Palpitations 06/26/2017  ? Paroxysmal atrial fibrillation 06/26/2017  ? remote history post operatively, CHADS 2 score=2  ? Premature atrial complex 06/26/2017  ? Premature ventricular contractions 06/26/2017  ? PSVT (paroxysmal supraventricular tachycardia) 11/10/2017  ? 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 Thoracic aorta  atherosclerosis 05/22/2020  ? Traumatic pneumothorax 06/30/2017  ? ? ?Past Surgical History:  ?Procedure Laterality Date  ? ABDOMINAL HYSTERECTOMY    ? SHOULDER SURGERY    ? TONSILLECTOMY    ? ? ?Family History  ?Problem Relation Age of Onset  ? Bone cancer Mother   ? Breast cancer Father   ? Diabetes Father   ? Heart disease Father   ? Dementia Sister   ? Breast cancer Sister   ? Alzheimer's disease Sister   ? Dementia Brother   ? ? ?Social History  ? ?Socioeconomic History  ? Marital status: Widowed  ?  Spouse name: Not on file  ? Number of children: 2  ? Years of education: 36  ? Highest education level: High school graduate  ?Occupational History  ? Occupation: Retired  ?Tobacco Use  ? Smoking status: Never  ?  Passive exposure: Past  ? Smokeless tobacco: Never  ?Vaping Use  ? Vaping Use: Never used  ?Substance and Sexual Activity  ? Alcohol use: No  ? Drug use: No  ? Sexual activity: Not Currently  ?Other Topics Concern  ? Not on file  ?Social History Narrative  ? Mrs. Thorington is widowed. Her daughter, [name], is her POA and lives 12 miles away. She resides in her own home, alone for last 7 years. Since her CVA she has had someone with her most of the time. She has a hired caregiver or a family member stays most of the day. She feel safe in her home and in her neighborhood.  ? Right-handed.  ?  0.5 cups caffeine per day.  ? ?Social Determinants of Health  ? ?Financial Resource Strain: Not on file  ?Food Insecurity: Not on file  ?Transportation Needs: Not on file  ?Physical Activity: Not on file  ?Stress: Not on file  ?Social Connections: Not on file  ?Intimate Partner Violence: Not on file  ? ? ?Outpatient Medications Prior to Visit  ?Medication Sig Dispense Refill  ? acebutolol (SECTRAL) 200 MG capsule Take 1 capsule (200 mg total) by mouth every other day. 45 capsule 3  ? apixaban (ELIQUIS) 2.5 MG TABS tablet TAKE 1 TABLET(2.5 MG) BY MOUTH TWICE DAILY 180 tablet 1  ? atorvastatin (LIPITOR) 40 MG tablet  TAKE 1 TABLET(40 MG) BY MOUTH DAILY 90 tablet 2  ? Cholecalciferol (VITAMIN D3) 1000 units CAPS Take 1,000 Units by mouth daily.    ? divalproex (DEPAKOTE ER) 500 MG 24 hr tablet TAKE 1 TABLET(500 MG) BY MOUTH AT BEDTIME 90 tablet 1  ? donepezil (ARICEPT) 5 MG tablet Take 1 tablet ( 5 mg ) daily (Patient taking differently: 10 mg. Take 1 tablet po qd) 30 tablet 11  ? melatonin 1 MG TABS tablet Take 1 mg by mouth at bedtime as needed (SLEEP).    ? midodrine (PROAMATINE) 5 MG tablet Take 1 tablet (5 mg total) by mouth 2 (two) times daily. 180 tablet 3  ? mirtazapine (REMERON) 30 MG tablet TAKE 1 TABLET(30 MG) BY MOUTH AT BEDTIME 90 tablet 1  ? vitamin B-12 (CYANOCOBALAMIN) 500 MCG tablet Take 500 mcg by mouth daily.     ? ?No facility-administered medications prior to visit.  ? ? ?Allergies  ?Allergen Reactions  ? Codeine Nausea And Vomiting  ? Penicillins Rash  ? Sulfa Antibiotics Nausea And Vomiting  ? ? ?Review of Systems  ?Constitutional:  Negative for chills, fatigue and fever.  ?HENT:  Negative for congestion, ear pain and sore throat.   ?Respiratory:  Negative for cough and shortness of breath.   ?Cardiovascular:  Negative for chest pain and palpitations.  ?Gastrointestinal:  Negative for abdominal pain, constipation, diarrhea, nausea and vomiting.  ?     Black stool  ?Endocrine: Negative for polydipsia, polyphagia and polyuria.  ?Genitourinary:  Negative for difficulty urinating and dysuria.  ?Musculoskeletal:  Negative for arthralgias, back pain and myalgias.  ?Skin:  Negative for rash.  ?Neurological:  Negative for headaches.  ?Psychiatric/Behavioral:  Negative for dysphoric mood. The patient is not nervous/anxious.   ? ?   ?Objective:  ?  ?Physical Exam ?Vitals reviewed.  ?Constitutional:   ?   Appearance: She is ill-appearing.  ?   Comments: Pale conjunctiva  ?HENT:  ?   Head: Normocephalic.  ?   Right Ear: Tympanic membrane normal.  ?   Left Ear: Tympanic membrane normal.  ?Cardiovascular:  ?   Rate and  Rhythm: Normal rate and regular rhythm.  ?   Pulses: Normal pulses.  ?   Heart sounds: Normal heart sounds. No murmur heard. ?  No gallop.  ?Pulmonary:  ?   Effort: Pulmonary effort is normal. No respiratory distress.  ?   Breath sounds: Normal breath sounds. No wheezing.  ?Abdominal:  ?   General: Abdomen is flat.  ?   Tenderness: There is abdominal tenderness (LLQ).  ?Genitourinary: ?   Rectum: Guaiac result positive.  ?Musculoskeletal:  ?   Right lower leg: No edema.  ?   Left lower leg: No edema.  ?Skin: ?   General: Skin is warm.  ?  Capillary Refill: Capillary refill takes less than 2 seconds.  ?Neurological:  ?   General: No focal deficit present.  ?   Mental Status: She is alert. Mental status is at baseline.  ? ? ?BP (!) 70/50   Pulse (!) 107   Temp 99 ?F (37.2 ?C)   Resp 15   Ht 5' 3"  (1.6 m)   Wt 97 lb (44 kg)   LMP  (LMP Unknown)   SpO2 97%   BMI 17.18 kg/m?  ?Wt Readings from Last 3 Encounters:  ?12/31/21 97 lb (44 kg)  ?12/18/21 99 lb 9.6 oz (45.2 kg)  ?11/18/21 99 lb (44.9 kg)  ? ? ?Health Maintenance Due  ?Topic Date Due  ? Zoster Vaccines- Shingrix (1 of 2) Never done  ? DEXA SCAN  Never done  ? COVID-19 Vaccine (3 - Pfizer risk series) 10/31/2019  ? ? ?There are no preventive care reminders to display for this patient. ? ? ?Lab Results  ?Component Value Date  ? TSH 1.460 05/22/2020  ? ?Lab Results  ?Component Value Date  ? WBC 5.7 09/20/2021  ? HGB 11.9 09/20/2021  ? HCT 35.1 09/20/2021  ? MCV 99 (H) 09/20/2021  ? PLT 315 09/20/2021  ? ?Lab Results  ?Component Value Date  ? NA 140 09/20/2021  ? K 5.2 09/20/2021  ? CO2 27 09/20/2021  ? GLUCOSE 75 09/20/2021  ? BUN 16 09/20/2021  ? CREATININE 0.73 09/20/2021  ? BILITOT 0.5 09/20/2021  ? ALKPHOS 158 (H) 09/20/2021  ? AST 15 09/20/2021  ? ALT 8 09/20/2021  ? PROT 6.6 09/20/2021  ? ALBUMIN 4.1 09/20/2021  ? CALCIUM 9.6 09/20/2021  ? ANIONGAP 6 11/09/2018  ? EGFR 77 09/20/2021  ? ?Lab Results  ?Component Value Date  ? CHOL 112 03/18/2021  ? ?Lab  Results  ?Component Value Date  ? HDL 46 03/18/2021  ? ?Lab Results  ?Component Value Date  ? [place] 50 03/18/2021  ? ?Lab Results  ?Component Value Date  ? TRIG 78 03/18/2021  ? ?Lab Results  ?Component Value Date  ? [place]

## 2022-01-01 ENCOUNTER — Encounter: Payer: Self-pay | Admitting: Legal Medicine

## 2022-01-01 NOTE — Telephone Encounter (Signed)
Refer to GI of choice, continue eliquis for now ?lp ?

## 2022-01-10 ENCOUNTER — Other Ambulatory Visit: Payer: Self-pay | Admitting: Cardiology

## 2022-01-10 NOTE — Telephone Encounter (Signed)
Prescription refill request for Eliquis received. ?Indication:Afib ?Last office visit:4/23 ?Scr:0.7 ?Age: 88 ?Weight:44 kg ? ?Prescription refilled ? ?

## 2022-01-13 ENCOUNTER — Ambulatory Visit: Payer: PPO | Admitting: Neurology

## 2022-01-13 ENCOUNTER — Encounter: Payer: Self-pay | Admitting: Neurology

## 2022-01-13 VITALS — BP 110/70 | HR 70 | Ht 63.0 in | Wt 92.0 lb

## 2022-01-13 DIAGNOSIS — F0280 Dementia in other diseases classified elsewhere without behavioral disturbance: Secondary | ICD-10-CM

## 2022-01-13 DIAGNOSIS — G309 Alzheimer's disease, unspecified: Secondary | ICD-10-CM | POA: Diagnosis not present

## 2022-01-13 NOTE — Progress Notes (Signed)
? ?NEUROLOGY FOLLOW UP OFFICE NOTE ? ?[name] ?[id number] ?08/08/1926 ? ?HISTORY OF PRESENT ILLNESS: ?I had the pleasure of seeing [name] in follow-up in the neurology clinic on 01/13/2022.  The patient was last seen by Memory Disorders PA [name] 6 months ago for dementia. She is accompanied by her daughter [name] who helps supplement the history today.  Records and images were personally reviewed where available.  Since her last visit, she underwent Neuropsychological testing with [name] in 08/2021 with a diagnosis of Major Neurocognitive disorder, with primary impairments in processing speed, cognitive flexibility, confrontation naming, visuospatial abilities, and delayed retrieval aspects of memory. Most lkely etiology is late-onset Alzheimer's disease with possibility for a vascular contribution (chronic left frontal infarct and moderate chronic microvascular disease). Competency was discussed with [name], and he explained that while the current report was clinical/diagnostic in nature rather than a competency evaluation, it is felt that "she clearly meets diagnostic criteria for dementia, does not have proper insight into her current functioning, and would be at risk of placing herself in potentially harmful or unsafe positions due to this lack of proper insight." I personally reviewed MRI brain without contrast done 04/2021 which did not show any acute changes, there was moderate diffuse atrophy and chronic microvascular disease, chronic left parietal lobe infarct.  ? ?Since her last visit, [name] reports there has been deterioration with everything (memory, reasoning) since her fall in December 2022. She thinks her memory is pretty good. She introduces her daughter as her mother. She denies any headaches, dizziness, vision changes, neck/back pain, focal numbness/tingling/weakness. She denies any swallowing difficulties however [name] reminds her she has a lot of swallowing issues. She has also  been having black stools and will be seeing GI. She was having diarrhea with incontinence which has quieted down since started GERD medication. She has a caregiver from 9am to 9pm who prepares meals and administers medications. [name] manages finances. She does not drive. She needs help with bathing, she is able to dress herself. She is alone at night and sleeps through the night. There is some paranoia, no hallucinations. She was previously evaluated by neurologist [name] and had a confusional episode then an episode of loss of consciousness a few days later in 2021. Cardiac monitor no significant abnormalities. EEG was normal. She was initially on Keppra then switched to Depakote ER 500mg  qhs for seizure prophylaxis and mood stabilization. No further similar episodes since then, the Depakote has helped a lot with mood changes. She has been on Donepezil 5mg  daily since August 2022, unclear if beneficial. They are concerned about her weight, she eats what she wants but does not have an appetite, and has lost weight, currently 92 lbs. She has not had any further falls since December and likes to work outside on her flowers and do word searches.  ? ? ?PAST MEDICAL HISTORY: ?Past Medical History:  ?Diagnosis Date  ? Acute ischemic stroke 11/08/2018  ? left postcentral gyri; left frontal  ? Adjustment disorder with depressed mood 10/16/2019  ? Alteration consciousness 03/08/2020  ? Apraxia following cerebrovascular accident 10/16/2019  ? B12 deficiency 10/16/2019  ? Bilateral impacted cerumen 04/02/2021  ? Body mass index (BMI) less than 19 02/16/2020  ? Chronic kidney disease, stage II (mild) 12/06/2019  ? Closed fracture of one rib of right side with routine healing 06/30/2017  ? Confusion   ? COPD (chronic obstructive pulmonary disease) 11/16/2017  ? Current use of long term anticoagulation 07/21/2017  ?  Essential hypertension, benign 10/16/2019  ? First degree atrioventricular block 06/26/2017  ? Gait abnormality  09/24/2020  ? Hammer toe 06/26/2017  ? Hearing loss   ? Hyperlipidemia 06/26/2017  ? Laceration of right lower leg 06/18/2020  ? Major neurocognitive disorder due to Alzheimer's disease 08/16/2021  ? Malnutrition of moderate degree 12/06/2019  ? Memory loss   ? Other specified cardiac arrhythmias 06/26/2017  ? Pain in limb 06/26/2017  ? Palpitations 06/26/2017  ? Paroxysmal atrial fibrillation 06/26/2017  ? remote history post operatively, CHADS 2 score=2  ? Premature atrial complex 06/26/2017  ? Premature ventricular contractions 06/26/2017  ? PSVT (paroxysmal supraventricular tachycardia) 11/10/2017  ? 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 Thoracic aorta atherosclerosis 05/22/2020  ? Traumatic pneumothorax 06/30/2017  ? ? ?MEDICATIONS: ?Current Outpatient Medications on File Prior to Visit  ?Medication Sig Dispense Refill  ? acebutolol (SECTRAL) 200 MG capsule Take 1 capsule (200 mg total) by mouth every other day. 45 capsule 3  ? atorvastatin (LIPITOR) 40 MG tablet TAKE 1 TABLET(40 MG) BY MOUTH DAILY 90 tablet 2  ? Cholecalciferol (VITAMIN D3) 1000 units CAPS Take 1,000 Units by mouth daily.    ? divalproex (DEPAKOTE ER) 500 MG 24 hr tablet TAKE 1 TABLET(500 MG) BY MOUTH AT BEDTIME 90 tablet 1  ? donepezil (ARICEPT) 5 MG tablet Take 1 tablet ( 5 mg ) daily (Patient taking differently: 10 mg. Take 1 tablet po qd) 30 tablet 11  ? ELIQUIS 2.5 MG TABS tablet TAKE 1 TABLET(2.5 MG) BY MOUTH TWICE DAILY 180 tablet 1  ? melatonin 1 MG TABS tablet Take 1 mg by mouth at bedtime as needed (SLEEP).    ? midodrine (PROAMATINE) 5 MG tablet Take 1 tablet (5 mg total) by mouth 2 (two) times daily. 180 tablet 3  ? mirtazapine (REMERON) 30 MG tablet TAKE 1 TABLET(30 MG) BY MOUTH AT BEDTIME 90 tablet 1  ? vitamin B-12 (CYANOCOBALAMIN) 500 MCG tablet Take 500 mcg by mouth daily.     ? ?No current facility-administered medications on file prior to visit.  ? ? ?ALLERGIES: ?Allergies  ?Allergen Reactions  ?  Codeine Nausea And Vomiting  ? Penicillins Rash  ? Sulfa Antibiotics Nausea And Vomiting  ? ? ?FAMILY HISTORY: ?Family History  ?Problem Relation Age of Onset  ? Bone cancer Mother   ? Breast cancer Father   ? Diabetes Father   ? Heart disease Father   ? Dementia Sister   ? Breast cancer Sister   ? Alzheimer's disease Sister   ? Dementia Brother   ? ? ?SOCIAL HISTORY: ?Social History  ? ?Socioeconomic History  ? Marital status: Widowed  ?  Spouse name: Not on file  ? Number of children: 2  ? Years of education: 89  ? Highest education level: High school graduate  ?Occupational History  ? Occupation: Retired  ?Tobacco Use  ? Smoking status: Never  ?  Passive exposure: Past  ? Smokeless tobacco: Never  ?Vaping Use  ? Vaping Use: Never used  ?Substance and Sexual Activity  ? Alcohol use: No  ? Drug use: No  ? Sexual activity: Not Currently  ?Other Topics Concern  ? Not on file  ?Social History Narrative  ? Mrs. Shorey is widowed. Her daughter, [name], is her POA and lives 12 miles away. She resides in her own home, alone for last 7 years. Since her CVA she has had someone with her most of the time. She has a hired caregiver  or a family member stays most of the day. She feel safe in her home and in her neighborhood.  ? Right-handed.  ? 0.5 cups caffeine per day.  ? ?Social Determinants of Health  ? ?Financial Resource Strain: Not on file  ?Food Insecurity: Not on file  ?Transportation Needs: Not on file  ?Physical Activity: Not on file  ?Stress: Not on file  ?Social Connections: Not on file  ?Intimate Partner Violence: Not on file  ? ? ? ?PHYSICAL EXAM: ?Vitals:  ? 01/13/22 1130  ?BP: 110/70  ?Pulse: 70  ?SpO2: 98%  ? ?General: No acute distress ?Head:  Normocephalic/atraumatic ?Skin/Extremities: No rash, no edema ?Neurological Exam: alert and awake, month is April, year is "2003 or 4." No aphasia or dysarthria. Fund of knowledge is reduced.  Recent and remote memory are impaired, 2/3 delayed recall. Attention  and concentration are normal, 5/5 WORLD backwards. Able to name. Cranial nerves: Pupils equal, round. Extraocular movements intact with no nystagmus. Visual fields full.  No facial asymmetry.  Motor: Bulk and t

## 2022-01-13 NOTE — Patient Instructions (Signed)
Good to meet you. ? ?Hold on the Donepezil (Aricept). See how you feel off medication ? ?2. Continue Depakote ? ?3. Proceed with GI evaluation ? ?4. Increase hydration and try eating small frequent meals ? ?5. Follow-up in 6 months, call for any changes ? ? ?FALL PRECAUTIONS: Be cautious when walking. Scan the area for obstacles that may increase the risk of trips and falls. When getting up in the mornings, sit up at the edge of the bed for a few minutes before getting out of bed. Consider elevating the bed at the head end to avoid drop of blood pressure when getting up. Walk always in a well-lit room (use night lights in the walls). Avoid area rugs or power cords from appliances in the middle of the walkways. Use a walker or a cane if necessary and consider physical therapy for balance exercise. Get your eyesight checked regularly. ? ?HOME SAFETY: Consider the safety of the kitchen when operating appliances like stoves, microwave oven, and blender. Consider having supervision and share cooking responsibilities until no longer able to participate in those. Accidents with firearms and other hazards in the house should be identified and addressed as well. ? ?ABILITY TO BE LEFT ALONE: If patient is unable to contact 911 operator, consider using LifeLine, or when the need is there, arrange for someone to stay with patients. Smoking is a fire hazard, consider supervision or cessation. Risk of wandering should be assessed by caregiver and if detected at any point, supervision and safe proof recommendations should be instituted. ? ? ?RECOMMENDATIONS FOR ALL PATIENTS WITH MEMORY PROBLEMS: ?1. Continue to exercise (Recommend 30 minutes of walking everyday, or 3 hours every week) ?2. Increase social interactions - continue going to [place] and enjoy social gatherings with friends and family ?3. Eat healthy, avoid fried foods and eat more fruits and vegetables ?4. Maintain adequate blood pressure, blood sugar, and blood  cholesterol level. Reducing the risk of stroke and cardiovascular disease also helps promoting better memory. ?5. Avoid stressful situations. Live a simple life and avoid aggravations. Organize your time and prepare for the next day in anticipation. ?6. Sleep well, avoid any interruptions of sleep and avoid any distractions in the bedroom that may interfere with adequate sleep quality ?7. Avoid sugar, avoid sweets as there is a strong link between excessive sugar intake, diabetes, and cognitive impairment ?We discussed the Mediterranean diet, which has been shown to help patients reduce the risk of progressive memory disorders and reduces cardiovascular risk. This includes eating fish, eat fruits and green leafy vegetables, nuts like almonds and hazelnuts, walnuts, and also use olive oil. Avoid fast foods and fried foods as much as possible. Avoid sweets and sugar as sugar use has been linked to worsening of memory function. ? ?There is always a concern of gradual progression of memory problems. If this is the case, then we may need to adjust level of care according to patient needs. Support, both to the patient and caregiver, should then be put into place. ? ?

## 2022-01-14 ENCOUNTER — Other Ambulatory Visit: Payer: Self-pay | Admitting: Legal Medicine

## 2022-01-15 ENCOUNTER — Ambulatory Visit: Payer: PPO | Admitting: Physician Assistant

## 2022-01-16 DIAGNOSIS — K921 Melena: Secondary | ICD-10-CM | POA: Diagnosis not present

## 2022-01-16 DIAGNOSIS — R1310 Dysphagia, unspecified: Secondary | ICD-10-CM | POA: Diagnosis not present

## 2022-01-16 DIAGNOSIS — Z1212 Encounter for screening for malignant neoplasm of rectum: Secondary | ICD-10-CM | POA: Diagnosis not present

## 2022-01-20 DIAGNOSIS — K921 Melena: Secondary | ICD-10-CM | POA: Diagnosis not present

## 2022-01-21 ENCOUNTER — Ambulatory Visit: Payer: PPO | Admitting: Emergency Medicine

## 2022-01-21 ENCOUNTER — Encounter: Payer: Self-pay | Admitting: Emergency Medicine

## 2022-01-21 DIAGNOSIS — Z0000 Encounter for general adult medical examination without abnormal findings: Secondary | ICD-10-CM | POA: Diagnosis not present

## 2022-01-21 NOTE — Patient Instructions (Addendum)
Health Maintenance, Female You can get an updated COVID booster vaccine at [facility].  Adopting a healthy lifestyle and getting preventive care are important in promoting health and wellness. Ask your health care provider about: The right schedule for you to have regular tests and exams. Things you can do on your own to prevent diseases and keep yourself healthy. What should I know about diet, weight, and exercise? Eat a healthy diet  Eat a diet that includes plenty of vegetables, fruits, low-fat dairy products, and lean protein. Do not eat a lot of foods that are high in solid fats, added sugars, or sodium. Maintain a healthy weight Body mass index (BMI) is used to identify weight problems. It estimates body fat based on height and weight. Your health care provider can help determine your BMI and help you achieve or maintain a healthy weight. Get regular exercise Get regular exercise. This is one of the most important things you can do for your health. Most adults should: Exercise for at least 150 minutes each week. The exercise should increase your heart rate and make you sweat (moderate-intensity exercise). Do strengthening exercises at least twice a week. This is in addition to the moderate-intensity exercise. Spend less time sitting. Even light physical activity can be beneficial. Watch cholesterol and blood lipids Have your blood tested for lipids and cholesterol at 86 years of age, then have this test every 5 years. Have your cholesterol levels checked more often if: Your lipid or cholesterol levels are high. You are older than 86 years of age. You are at high risk for heart disease. What should I know about cancer screening? Depending on your health history and family history, you may need to have cancer screening at various ages. This may include screening for: Breast cancer. Cervical cancer. Colorectal cancer. Skin cancer. Lung cancer. What should I know about heart disease,  diabetes, and high blood pressure? Blood pressure and heart disease High blood pressure causes heart disease and increases the risk of stroke. This is more likely to develop in people who have high blood pressure readings or are overweight. Have your blood pressure checked: Every 3-5 years if you are 42-59 years of age. Every year if you are 4 years old or older. Diabetes Have regular diabetes screenings. This checks your fasting blood sugar level. Have the screening done: Once every three years after age 47 if you are at a normal weight and have a low risk for diabetes. More often and at a younger age if you are overweight or have a high risk for diabetes. What should I know about preventing infection? Hepatitis B If you have a higher risk for hepatitis B, you should be screened for this virus. Talk with your health care provider to find out if you are at risk for hepatitis B infection. Hepatitis C Testing is recommended for: Everyone born from [birth year] through 1963. Anyone with known risk factors for hepatitis C. Sexually transmitted infections (STIs) Get screened for STIs, including gonorrhea and chlamydia, if: You are sexually active and are younger than 86 years of age. You are older than 86 years of age and your health care provider tells you that you are at risk for this type of infection. Your sexual activity has changed since you were last screened, and you are at increased risk for chlamydia or gonorrhea. Ask your health care provider if you are at risk. Ask your health care provider about whether you are at high risk for HIV. Your health  care provider may recommend a prescription medicine to help prevent HIV infection. If you choose to take medicine to prevent HIV, you should first get tested for HIV. You should then be tested every 3 months for as long as you are taking the medicine. Pregnancy If you are about to stop having your period (premenopausal) and you may become pregnant,  seek counseling before you get pregnant. Take 400 to 800 micrograms (mcg) of folic acid every day if you become pregnant. Ask for birth control (contraception) if you want to prevent pregnancy. Osteoporosis and menopause Osteoporosis is a disease in which the bones lose minerals and strength with aging. This can result in bone fractures. If you are 59 years old or older, or if you are at risk for osteoporosis and fractures, ask your health care provider if you should: Be screened for bone loss. Take a calcium or vitamin D supplement to lower your risk of fractures. Be given hormone replacement therapy (HRT) to treat symptoms of menopause. Follow these instructions at home: Alcohol use Do not drink alcohol if: Your health care provider tells you not to drink. You are pregnant, may be pregnant, or are planning to become pregnant. If you drink alcohol: Limit how much you have to: 0-1 drink a day. Know how much alcohol is in your drink. In the [place] one drink equals one 12 oz bottle of beer (355 mL), one 5 oz glass of wine (148 mL), or one 1 oz glass of hard liquor (44 mL). Lifestyle Do not use any products that contain nicotine or tobacco. These products include cigarettes, chewing tobacco, and vaping devices, such as e-cigarettes. If you need help quitting, ask your health care provider. Do not use street drugs. Do not share needles. Ask your health care provider for help if you need support or information about quitting drugs. General instructions Schedule regular health, dental, and eye exams. Stay current with your vaccines. Tell your health care provider if: You often feel depressed. You have ever been abused or do not feel safe at home. Summary Adopting a healthy lifestyle and getting preventive care are important in promoting health and wellness. Follow your health care provider's instructions about healthy diet, exercising, and getting tested or screened for diseases. Follow your  health care provider's instructions on monitoring your cholesterol and blood pressure. This information is not intended to replace advice given to you by your health care provider. Make sure you discuss any questions you have with your health care provider. Document Revised: 01/07/2021 Document Reviewed: 01/07/2021 Elsevier Patient Education  [place].

## 2022-01-21 NOTE — Progress Notes (Signed)
Annual Wellness Visit  Subjective:   [name] is a 88 y.o. Female who presents for Medicare Annual initial preventive examination.  I connected with  [name], daughter and designed proxy for  [name] on 01/21/22 by a audio enabled telemedicine application and verified that I am speaking with the correct person using two identifiers.  Patient Location: Home  Provider Location: Home Office  I discussed the limitations of evaluation and management by telemedicine. The patient expressed understanding and agreed to proceed.   [name] is a 88 y.o. female who presents today for her Annual Wellness Visit. I spoke with daughter [name] as pt has dementia and is hard of hearing  [name] reports pt has balance issues and falls frequently; pt also rushes movements, increasing risk of falls. Pt has tried cane and walker but walker is too awkward/big and cane did not help. [name] encourages pt to walk with her hands on the wall for stability and to take her time.   Pt has paid caregivers at home with her every day during the day for 12 hours. No one stays with her at night. [name] reports pt's home is as safe as it can be made for her.   Pt has advanced directives - [facility] has a copy       Objective:    There were no vitals filed for this visit.   BP Readings from Last 3 Encounters:  01/13/22 110/70  12/31/21 (!) 70/50  12/18/21 108/72   Wt Readings from Last 3 Encounters:  01/13/22 92 lb (41.7 kg)  12/31/21 97 lb (44 kg)  12/18/21 99 lb 9.6 oz (45.2 kg)      There is no height or weight on file to calculate BMI.     01/13/2022   11:36 AM 07/18/2021   11:09 AM 01/10/2019    6:27 PM 11/09/2018   10:00 AM  Advanced Directives  Does Patient Have a Medical Advance Directive? Yes Yes Yes Yes  Type of Estate agent of [place];Living will;Out of facility DNR (pink MOST or yellow form)  Healthcare Power of [place];Living will Healthcare  Power of [place];Living will  Does patient want to make changes to medical advance directive?   No - Patient declined No - Patient declined  Copy of Healthcare Power of Attorney in Chart?   No - copy requested No - copy requested    Current Medications (verified) Outpatient Encounter Medications as of 01/21/2022  Medication Sig   acebutolol (SECTRAL) 200 MG capsule Take 1 capsule (200 mg total) by mouth every other day.   atorvastatin (LIPITOR) 40 MG tablet TAKE 1 TABLET(40 MG) BY MOUTH DAILY   Cholecalciferol (VITAMIN D3) 1000 units CAPS Take 1,000 Units by mouth daily.   divalproex (DEPAKOTE ER) 500 MG 24 hr tablet TAKE 1 TABLET(500 MG) BY MOUTH AT BEDTIME   ELIQUIS 2.5 MG TABS tablet TAKE 1 TABLET(2.5 MG) BY MOUTH TWICE DAILY   midodrine (PROAMATINE) 5 MG tablet Take 1 tablet (5 mg total) by mouth 2 (two) times daily.   mirtazapine (REMERON) 30 MG tablet TAKE 1 TABLET(30 MG) BY MOUTH AT BEDTIME   vitamin B-12 (CYANOCOBALAMIN) 500 MCG tablet Take 500 mcg by mouth daily.    [DISCONTINUED] donepezil (ARICEPT) 5 MG tablet Take 1 tablet ( 5 mg ) daily (Patient taking differently: 10 mg. Take 1 tablet po qd)   [DISCONTINUED] melatonin 1 MG TABS tablet Take 1 mg by mouth at bedtime as needed (  SLEEP).   No facility-administered encounter medications on file as of 01/21/2022.    Allergies (verified) Codeine, Penicillins, and Sulfa antibiotics   History: Past Medical History:  Diagnosis Date   Acute ischemic stroke 11/08/2018   left postcentral gyri; left frontal   Adjustment disorder with depressed mood 10/16/2019   Alteration consciousness 03/08/2020   Apraxia following cerebrovascular accident 10/16/2019   B12 deficiency 10/16/2019   Bilateral impacted cerumen 04/02/2021   Body mass index (BMI) less than 19 02/16/2020   Chronic kidney disease, stage II (mild) 12/06/2019   Closed fracture of one rib of right side with routine healing 06/30/2017   Confusion    COPD (chronic  obstructive pulmonary disease) 11/16/2017   Current use of long term anticoagulation 07/21/2017   Essential hypertension, benign 10/16/2019   First degree atrioventricular block 06/26/2017   Gait abnormality 09/24/2020   Hammer toe 06/26/2017   Hearing loss    Hyperlipidemia 06/26/2017   Laceration of right lower leg 06/18/2020   Major neurocognitive disorder due to Alzheimer's disease 08/16/2021   Malnutrition of moderate degree 12/06/2019   Memory loss    Other specified cardiac arrhythmias 06/26/2017   Pain in limb 06/26/2017   Palpitations 06/26/2017   Paroxysmal atrial fibrillation 06/26/2017   remote history post operatively, CHADS 2 score=2   Premature atrial complex 06/26/2017   Premature ventricular contractions 06/26/2017   PSVT (paroxysmal supraventricular tachycardia) 11/10/2017   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 10/16/2019   Thoracic aorta atherosclerosis 05/22/2020   Traumatic pneumothorax 06/30/2017   Past Surgical History:  Procedure Laterality Date   ABDOMINAL HYSTERECTOMY     SHOULDER SURGERY     TONSILLECTOMY     Family History  Problem Relation Age of Onset   Bone cancer Mother    Breast cancer Father    Diabetes Father    Heart disease Father    Dementia Sister    Breast cancer Sister    Alzheimer's disease Sister    Dementia Brother    Social History   Socioeconomic History   Marital status: Widowed    Spouse name: Not on file   Number of children: 2   Years of education: 12   Highest education level: High school graduate  Occupational History   Occupation: Retired  Tobacco Use   Smoking status: Never    Passive exposure: Past   Smokeless tobacco: Never  Vaping Use   Vaping Use: Never used  Substance and Sexual Activity   Alcohol use: No   Drug use: No   Sexual activity: Not Currently  Other Topics Concern   Not on file  Social History Narrative   Mrs. Ogando is widowed. Her daughter, [name], is her POA and  lives 12 miles away. She resides in her own home, alone for last 7 years. Since her CVA she has had someone with her most of the time. She has a hired caregiver or a family member stays most of the day. She feel safe in her home and in her neighborhood.   Right-handed.   0.5 cups caffeine per day.   One story home   Social Determinants of Health   Financial Resource Strain: Not on file  Food Insecurity: Not on file  Transportation Needs: Not on file  Physical Activity: Not on file  Stress: Not on file  Social Connections: Not on file    Tobacco Counseling Counseling given: Not Answered       Diabetic?no  Activities of Daily Living    03/28/2021    9:45 AM 03/18/2021   10:05 AM  In your present state of health, do you have any difficulty performing the following activities:  Hearing? 1 1  Vision? 1 0  Difficulty concentrating or making decisions? 1 1  Walking or climbing stairs? 1 1  Dressing or bathing? 0 0  Doing errands, shopping? 0 1    Patient Care Team: Abigail MiyamotoPerry, Lawrence Edward, MD as PCP - General (Family Medicine) Earvin HansenBrown, Sarah B, [facility] (Inactive) as Pharmacist (Pharmacist) Zigmund DanielPowell, A Caldwell Jr., MD as Consulting Physician (Family Medicine) Baldo DaubMunley, Brian J, MD as Consulting Physician (Cardiology)  Indicate any recent Medical Services you may have received from other than [facility] providers in the past year (date may be approximate).     Assessment:   This is a routine wellness examination for [place].  Hearing/Vision screen No results found.  Dietary issues and exercise activities discussed:     Goals Addressed   None    Depression Screen    10/29/2021    1:26 PM 03/28/2021    9:44 AM 09/25/2020   10:25 AM 05/22/2020   10:05 AM 01/10/2019    6:37 PM 12/20/2018    1:47 PM  PHQ 2/9 Scores  PHQ - 2 Score 2 0 2 6 0 0  PHQ- 9 Score 7  6 25       Fall Risk    01/21/2022   11:10 AM 01/13/2022   11:36 AM 10/29/2021    1:26 PM 07/18/2021   11:09 AM  03/28/2021    9:44 AM  Fall Risk   Falls in the past year? 1 1 1  0 1  Number falls in past yr: 1 1 1  0 1  Injury with Fall? 1 1 1  0 1  Risk for fall due to : History of fall(s);Impaired balance/gait;Impaired vision        FALL RISK PREVENTION PERTAINING TO THE HOME:  Any stairs in or around the home? No  If so, are there any without handrails? No  Home free of loose throw rugs in walkways, pet beds, electrical cords, etc? Yes  Adequate lighting in your home to reduce risk of falls? Yes  - will not turn on lights despite encouraging to do so  ASSISTIVE DEVICES UTILIZED TO PREVENT FALLS:  Life alert? Yes  - had but refuses to wear so cancelled Use of a cane, walker or w/c? Yes - supposed to use a walker but refuses, tried a cane but wasn't useful - pt tries to hang onto the walls when walking Grab bars in the bathroom? Yes  Shower chair or bench in shower? Yes  Elevated toilet seat or a handicapped toilet? Yes     Cognitive Function: - sees [facility] neuro for dementia care     07/16/2020   12:00 PM 03/08/2020    8:03 AM  MMSE - Mini Mental State Exam  Orientation to time 4 5  Orientation to Place 5 5  Registration 3 3  Attention/ Calculation 5 5  Recall 3 3  Language- name 2 objects 2 2  Language- repeat 1 1  Language- follow 3 step command 3 3  Language- read & follow direction 1 1  Write a sentence 1 1  Copy design 1 1  Total score 29 30      04/10/2021   12:00 PM  Montreal Cognitive Assessment   Visuospatial/ Executive (0/5) 1  Naming (0/3) 3  Attention: Read list of  digits (0/2) 2  Attention: Read list of letters (0/1) 1  Attention: Serial 7 subtraction starting at 100 (0/3) 2  Language: Repeat phrase (0/2) 2  Language : Fluency (0/1) 0  Abstraction (0/2) 1  Delayed Recall (0/5) 3  Orientation (0/6) 4  Total 19  Adjusted Score (based on education) 19      Immunizations Immunization History  Administered Date(s) Administered   Fluad Quad(high Dose 65+)  05/22/2020   Influenza-Unspecified 06/04/2021   PFIZER(Purple Top)SARS-COV-2 Vaccination 09/13/2019, 10/03/2019   Pneumococcal Conjugate-13 08/14/2014   Pneumococcal Polysaccharide-23 08/30/2018   Tdap 12/20/2018    TDAP status: Up to date  Flu Vaccine status: Up to date  Pneumococcal vaccine status: Up to date  Covid-19 vaccine status: Information provided on how to obtain vaccines.  Explained purpose of bivalent booster - instructed to get at pharmacy   Qualifies for Shingles Vaccine? Yes   - per [facility], pt has had in the past  Screening Tests Health Maintenance  Topic Date Due   Zoster Vaccines- Shingrix (1 of 2) Never done   DEXA SCAN  Never done   COVID-19 Vaccine (3 - Pfizer risk series) 10/31/2019   INFLUENZA VACCINE  04/01/2022   TETANUS/TDAP  12/19/2028   Pneumonia Vaccine 16+ Years old  Completed   HPV VACCINES  Aged Out    Health Maintenance  Health Maintenance Due  Topic Date Due   Zoster Vaccines- Shingrix (1 of 2) Never done   DEXA SCAN  Never done   COVID-19 Vaccine (3 - Pfizer risk series) 10/31/2019    Colorectal cancer screening: No longer required.   Mammogram status: No longer required due to age.  Bone density - No longer required due to age.  Lung Cancer Screening: (Low Dose CT Chest recommended if Age 20-80 years, 30 pack-year currently smoking OR have quit w/in 15years.) does not qualify.   Additional Screening:  Hepatitis C Screening: does not qualify;   Vision Screening: Recommended annual ophthalmology exams for early detection of glaucoma and other disorders of the eye. Is the patient up to date with their annual eye exam?  Yes  Who is the provider or what is the name of the office in which the patient attends annual eye exams? Dr. Lawernce Ion   Dental Screening: Recommended annual dental exams for proper oral hygiene - goes twice a year  Community Resource Referral / Chronic Care Management: CRR required this visit?  No   CCM  required this visit?  No      Plan:     Assessment & Plan   Annual wellness visit done today including the all of the following: Reviewed patient's Family Medical History Reviewed and updated list of patient's medical providers Assessment of cognitive impairment was done Assessed patient's functional ability Established a written schedule for health screening services Health Risk Assessent Completed and Reviewed   I have personally reviewed and noted the following in the patient's chart:   Medical and social history Use of alcohol, tobacco or illicit drugs  Current medications and supplements including opioid prescriptions.  Functional ability and status Nutritional status Physical activity Advanced directives List of other physicians Hospitalizations, surgeries, and ER visits in previous 12 months Vitals Screenings to include cognitive, depression, and falls Referrals and appointments  In addition, I have reviewed and discussed with patient certain preventive protocols, quality metrics, and best practice recommendations. A written personalized care plan for preventive services as well as general preventive health recommendations were provided to patient.  Cathlyn Parsons, NP   01/21/2022   Nurse Notes:  - instructed to get COVID booster at pharmacy  - Shingles vaccines up to date - had at previous provider's office years ago  I spent 22 minutes with patient for this annual wellness visit

## 2022-01-24 DIAGNOSIS — K921 Melena: Secondary | ICD-10-CM | POA: Diagnosis not present

## 2022-01-28 DIAGNOSIS — R1310 Dysphagia, unspecified: Secondary | ICD-10-CM | POA: Diagnosis not present

## 2022-02-05 DIAGNOSIS — K224 Dyskinesia of esophagus: Secondary | ICD-10-CM | POA: Diagnosis not present

## 2022-02-05 DIAGNOSIS — R1310 Dysphagia, unspecified: Secondary | ICD-10-CM | POA: Diagnosis not present

## 2022-02-12 DIAGNOSIS — K921 Melena: Secondary | ICD-10-CM | POA: Diagnosis not present

## 2022-02-14 DIAGNOSIS — M7989 Other specified soft tissue disorders: Secondary | ICD-10-CM | POA: Diagnosis not present

## 2022-02-14 DIAGNOSIS — S41101A Unspecified open wound of right upper arm, initial encounter: Secondary | ICD-10-CM | POA: Diagnosis not present

## 2022-02-14 DIAGNOSIS — S51811A Laceration without foreign body of right forearm, initial encounter: Secondary | ICD-10-CM | POA: Diagnosis not present

## 2022-02-17 ENCOUNTER — Other Ambulatory Visit: Payer: Self-pay | Admitting: Cardiology

## 2022-02-17 DIAGNOSIS — S51811D Laceration without foreign body of right forearm, subsequent encounter: Secondary | ICD-10-CM | POA: Diagnosis not present

## 2022-02-17 DIAGNOSIS — W19XXXD Unspecified fall, subsequent encounter: Secondary | ICD-10-CM | POA: Diagnosis not present

## 2022-02-18 DIAGNOSIS — S51811D Laceration without foreign body of right forearm, subsequent encounter: Secondary | ICD-10-CM | POA: Diagnosis not present

## 2022-02-18 DIAGNOSIS — W19XXXD Unspecified fall, subsequent encounter: Secondary | ICD-10-CM | POA: Diagnosis not present

## 2022-02-20 ENCOUNTER — Other Ambulatory Visit: Payer: Self-pay | Admitting: Legal Medicine

## 2022-02-20 DIAGNOSIS — R1310 Dysphagia, unspecified: Secondary | ICD-10-CM | POA: Diagnosis not present

## 2022-02-20 DIAGNOSIS — K921 Melena: Secondary | ICD-10-CM | POA: Diagnosis not present

## 2022-02-20 DIAGNOSIS — E782 Mixed hyperlipidemia: Secondary | ICD-10-CM

## 2022-02-25 DIAGNOSIS — S51811D Laceration without foreign body of right forearm, subsequent encounter: Secondary | ICD-10-CM | POA: Diagnosis not present

## 2022-02-27 ENCOUNTER — Ambulatory Visit (INDEPENDENT_AMBULATORY_CARE_PROVIDER_SITE_OTHER): Payer: PPO | Admitting: Family Medicine

## 2022-02-27 ENCOUNTER — Encounter: Payer: Self-pay | Admitting: Family Medicine

## 2022-02-27 VITALS — BP 100/80 | HR 86 | Temp 98.3°F | Resp 15 | Ht 62.0 in | Wt 98.0 lb

## 2022-02-27 DIAGNOSIS — S51811A Laceration without foreign body of right forearm, initial encounter: Secondary | ICD-10-CM

## 2022-02-27 DIAGNOSIS — W19XXXA Unspecified fall, initial encounter: Secondary | ICD-10-CM | POA: Diagnosis not present

## 2022-02-27 DIAGNOSIS — Y92009 Unspecified place in unspecified non-institutional (private) residence as the place of occurrence of the external cause: Secondary | ICD-10-CM | POA: Diagnosis not present

## 2022-02-27 NOTE — Progress Notes (Signed)
Subjective:  Patient ID: [name], female    DOB: 1925-11-27  Age: 88 y.o. MRN: [id number]  Chief Complaint  Patient presents with   laceration on right forearm   Follow-up   Fall    HPI  Patient was seen at [facility] ED on 02/14/2022 after Urgent Care recommended to go. Patient tripped and fell and striking her Right Forearm and received a laceration/avulsion to R anterior Forearm. She denied hitting her head. They took Xray and showed soft tissue swelling over the dorsal aspect of the distal forearm and wrist without underlying fracture or radiopaque foreign body. They gave her doxycycline 100 mg BID x 10 days and she finished it.  Current Outpatient Medications on File Prior to Visit  Medication Sig Dispense Refill   acebutolol (SECTRAL) 200 MG capsule Take 1 capsule (200 mg total) by mouth every other day. 45 capsule 3   atorvastatin (LIPITOR) 40 MG tablet TAKE 1 TABLET(40 MG) BY MOUTH DAILY 90 tablet 0   Cholecalciferol (VITAMIN D3) 1000 units CAPS Take 1,000 Units by mouth daily.     divalproex (DEPAKOTE ER) 500 MG 24 hr tablet TAKE 1 TABLET(500 MG) BY MOUTH AT BEDTIME 90 tablet 1   ELIQUIS 2.5 MG TABS tablet TAKE 1 TABLET(2.5 MG) BY MOUTH TWICE DAILY 180 tablet 1   midodrine (PROAMATINE) 5 MG tablet Take 1 tablet (5 mg total) by mouth 2 (two) times daily. 180 tablet 3   mirtazapine (REMERON) 30 MG tablet TAKE 1 TABLET(30 MG) BY MOUTH AT BEDTIME 90 tablet 1   pantoprazole (PROTONIX) 40 MG tablet Take 40 mg by mouth daily.     vitamin B-12 (CYANOCOBALAMIN) 500 MCG tablet Take 500 mcg by mouth daily.      No current facility-administered medications on file prior to visit.   Past Medical History:  Diagnosis Date   Acute ischemic stroke 11/08/2018   left postcentral gyri; left frontal   Adjustment disorder with depressed mood 10/16/2019   Alteration consciousness 03/08/2020   Apraxia following cerebrovascular accident 10/16/2019   B12 deficiency 10/16/2019    Bilateral impacted cerumen 04/02/2021   Body mass index (BMI) less than 19 02/16/2020   Chronic kidney disease, stage II (mild) 12/06/2019   Closed fracture of one rib of right side with routine healing 06/30/2017   Confusion    COPD (chronic obstructive pulmonary disease) 11/16/2017   Current use of long term anticoagulation 07/21/2017   Essential hypertension, benign 10/16/2019   First degree atrioventricular block 06/26/2017   Gait abnormality 09/24/2020   Hammer toe 06/26/2017   Hearing loss    Hyperlipidemia 06/26/2017   Laceration of right lower leg 06/18/2020   Major neurocognitive disorder due to Alzheimer's disease 08/16/2021   Malnutrition of moderate degree 12/06/2019   Memory loss    Other specified cardiac arrhythmias 06/26/2017   Pain in limb 06/26/2017   Palpitations 06/26/2017   Paroxysmal atrial fibrillation 06/26/2017   remote history post operatively, CHADS 2 score=2   Premature atrial complex 06/26/2017   Premature ventricular contractions 06/26/2017   PSVT (paroxysmal supraventricular tachycardia) 11/10/2017   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 10/16/2019   Thoracic aorta atherosclerosis 05/22/2020   Traumatic pneumothorax 06/30/2017   Past Surgical History:  Procedure Laterality Date   ABDOMINAL HYSTERECTOMY     SHOULDER SURGERY     TONSILLECTOMY      Family History  Problem Relation Age of Onset   Bone cancer Mother    Breast cancer Father  Diabetes Father    Heart disease Father    Dementia Sister    Breast cancer Sister    Alzheimer's disease Sister    Dementia Brother    Social History   Socioeconomic History   Marital status: Widowed    Spouse name: Not on file   Number of children: 2   Years of education: 12   Highest education level: High school graduate  Occupational History   Occupation: Retired  Tobacco Use   Smoking status: Never    Passive exposure: Past   Smokeless tobacco: Never  Vaping Use   Vaping Use:  Never used  Substance and Sexual Activity   Alcohol use: No   Drug use: No   Sexual activity: Not Currently  Other Topics Concern   Not on file  Social History Narrative   Mrs. Legrande is widowed. Her daughter, [name], is her POA and lives 12 miles away. She resides in her own home, alone for last 7 years. Since her CVA she has had someone with her most of the time. She has a hired caregiver or a family member stays most of the day. She feel safe in her home and in her neighborhood.   Right-handed.   0.5 cups caffeine per day.   One story home   Social Determinants of Health   Financial Resource Strain: Low Risk  (01/10/2019)   Overall Financial Resource Strain (CARDIA)    Difficulty of Paying Living Expenses: Not hard at all  Food Insecurity: No Food Insecurity (01/10/2019)   Hunger Vital Sign    Worried About Running Out of Food in the Last Year: Never true    Ran Out of Food in the Last Year: Never true  Transportation Needs: No Transportation Needs (01/10/2019)   PRAPARE - Administrator, Civil Service (Medical): No    Lack of Transportation (Non-Medical): No  Physical Activity: Inactive (01/10/2019)   Exercise Vital Sign    Days of Exercise per Week: 0 days    Minutes of Exercise per Session: 0 min  Stress: No Stress Concern Present (01/10/2019)   [employer] of Occupational Health - Occupational Stress Questionnaire    Feeling of Stress : Not at all  Social Connections: Somewhat Isolated (01/10/2019)   Social Connection and Isolation Panel [NHANES]    Frequency of Communication with Friends and Family: More than three times a week    Frequency of Social Gatherings with Friends and Family: More than three times a week    Attends Religious Services: More than 4 times per year    Active Member of [employer] or Organizations: No    Attends Banker Meetings: Never    Marital Status: Widowed    Review of Systems  Constitutional:  Negative for  chills, fatigue and fever.  HENT:  Negative for congestion, ear pain and sore throat.   Respiratory:  Negative for cough and shortness of breath.   Cardiovascular:  Negative for chest pain and palpitations.  Gastrointestinal:  Negative for abdominal pain, constipation, diarrhea, nausea and vomiting.  Endocrine: Negative for polydipsia, polyphagia and polyuria.  Genitourinary:  Negative for difficulty urinating and dysuria.  Musculoskeletal:  Negative for arthralgias, back pain and myalgias.  Skin:  Positive for wound (Right forearm swelling and redness). Negative for rash.  Neurological:  Negative for headaches.  Psychiatric/Behavioral:  Negative for dysphoric mood. The patient is not nervous/anxious.      Objective:  BP 100/80   Pulse 86  Temp 98.3 F (36.8 C)   Resp 15   Ht 5\' 2"  (1.575 m)   Wt 98 lb (44.5 kg)   LMP  (LMP Unknown)   SpO2 98%   BMI 17.92 kg/m      02/27/2022   11:03 AM 01/13/2022   11:30 AM 12/31/2021    2:39 PM  BP/Weight  Systolic BP 100 110 70  Diastolic BP 80 70 50  Wt. (Lbs) 98 92 97  BMI 17.92 kg/m2 16.3 kg/m2 17.18 kg/m2    Physical Exam Vitals reviewed.  Constitutional:      Appearance: Normal appearance.     Comments: Thin.  Frail  Cardiovascular:     Rate and Rhythm: Normal rate and regular rhythm.     Heart sounds: Normal heart sounds.  Pulmonary:     Effort: Pulmonary effort is normal.     Breath sounds: Normal breath sounds.  Skin:    Comments: Y-shaped laceration.  Area takes up approximately a third of her forearm.  Forks as a goes distally.  Patient has continue with sutures over this Y-shaped laceration.  Skin is very friable however it appears to be healing well. Sutures removed today.  Neurological:     Mental Status: She is alert.    Diabetic Foot Exam - Simple   No data filed      Lab Results  Component Value Date   WBC 5.7 09/20/2021   HGB 11.9 09/20/2021   HCT 35.1 09/20/2021   PLT 315 09/20/2021   GLUCOSE 75  09/20/2021   CHOL 112 03/18/2021   TRIG 78 03/18/2021   HDL 46 03/18/2021   LDLCALC 50 03/18/2021   ALT 8 09/20/2021   AST 15 09/20/2021   NA 140 09/20/2021   K 5.2 09/20/2021   CL 100 09/20/2021   CREATININE 0.73 09/20/2021   BUN 16 09/20/2021   CO2 27 09/20/2021   TSH 1.460 05/22/2020   HGBA1C 5.3 11/09/2018      Assessment & Plan:   Problem List Items Addressed This Visit       Musculoskeletal and Integument   Laceration of skin of forearm, right, initial encounter - Primary    Suture removal. Dressing applied.        Other   Fall at home, initial encounter    Recommend caution to prevent falls.      .  Follow-up: Return for at previously scheduled appt. .  An After Visit Summary was printed and given to the patient.  01/09/2019, MD [facility] [phone]

## 2022-02-27 NOTE — Progress Notes (Incomplete)
Subjective:  Patient ID: [name], female    DOB: 08-09-1926  Age: 88 y.o. MRN: [id number]  Chief Complaint  Patient presents with  . laceration on right forearm  . Follow-up  . Fall    HPI  Patient was seen at [facility] ED on 02/14/2022 after Urgent Care recommended to go. Patient tripped and fell and striking her Right Forearm and received a laceration/avulsion to R anterior Forearm. She denied hitting her head. They took Xray and showed soft tissue swelling over the dorsal aspect of the distal forearm and wrist without underlying fracture or radiopaque foreign body. They gave her doxycycline 100 mg BID x 10 days and she finished it. Current Outpatient Medications on File Prior to Visit  Medication Sig Dispense Refill  . acebutolol (SECTRAL) 200 MG capsule Take 1 capsule (200 mg total) by mouth every other day. 45 capsule 3  . atorvastatin (LIPITOR) 40 MG tablet TAKE 1 TABLET(40 MG) BY MOUTH DAILY 90 tablet 0  . Cholecalciferol (VITAMIN D3) 1000 units CAPS Take 1,000 Units by mouth daily.    . divalproex (DEPAKOTE ER) 500 MG 24 hr tablet TAKE 1 TABLET(500 MG) BY MOUTH AT BEDTIME 90 tablet 1  . ELIQUIS 2.5 MG TABS tablet TAKE 1 TABLET(2.5 MG) BY MOUTH TWICE DAILY 180 tablet 1  . midodrine (PROAMATINE) 5 MG tablet Take 1 tablet (5 mg total) by mouth 2 (two) times daily. 180 tablet 3  . mirtazapine (REMERON) 30 MG tablet TAKE 1 TABLET(30 MG) BY MOUTH AT BEDTIME 90 tablet 1  . vitamin B-12 (CYANOCOBALAMIN) 500 MCG tablet Take 500 mcg by mouth daily.      No current facility-administered medications on file prior to visit.   Past Medical History:  Diagnosis Date  . Acute ischemic stroke 11/08/2018   left postcentral gyri; left frontal  . Adjustment disorder with depressed mood 10/16/2019  . Alteration consciousness 03/08/2020  . Apraxia following cerebrovascular accident 10/16/2019  . B12 deficiency 10/16/2019  . Bilateral impacted cerumen 04/02/2021  . Body mass  index (BMI) less than 19 02/16/2020  . Chronic kidney disease, stage II (mild) 12/06/2019  . Closed fracture of one rib of right side with routine healing 06/30/2017  . Confusion   . COPD (chronic obstructive pulmonary disease) 11/16/2017  . Current use of long term anticoagulation 07/21/2017  . Essential hypertension, benign 10/16/2019  . First degree atrioventricular block 06/26/2017  . Gait abnormality 09/24/2020  . Hammer toe 06/26/2017  . Hearing loss   . Hyperlipidemia 06/26/2017  . Laceration of right lower leg 06/18/2020  . Major neurocognitive disorder due to Alzheimer's disease 08/16/2021  . Malnutrition of moderate degree 12/06/2019  . Memory loss   . Other specified cardiac arrhythmias 06/26/2017  . Pain in limb 06/26/2017  . Palpitations 06/26/2017  . Paroxysmal atrial fibrillation 06/26/2017   remote history post operatively, CHADS 2 score=2  . Premature atrial complex 06/26/2017  . Premature ventricular contractions 06/26/2017  . PSVT (paroxysmal supraventricular tachycardia) 11/10/2017  . 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 Thoracic aorta atherosclerosis 05/22/2020  . Traumatic pneumothorax 06/30/2017   Past Surgical History:  Procedure Laterality Date  . ABDOMINAL HYSTERECTOMY    . SHOULDER SURGERY    . TONSILLECTOMY      Family History  Problem Relation Age of Onset  . Bone cancer Mother   . Breast cancer Father   . Diabetes Father   . Heart disease Father   . Dementia Sister   .  Breast cancer Sister   . Alzheimer's disease Sister   . Dementia Brother    Social History   Socioeconomic History  . Marital status: Widowed    Spouse name: Not on file  . Number of children: 2  . Years of education: 35  . Highest education level: High school graduate  Occupational History  . Occupation: Retired  Tobacco Use  . Smoking status: Never    Passive exposure: Past  . Smokeless tobacco: Never  Vaping Use  . Vaping Use: Never used   Substance and Sexual Activity  . Alcohol use: No  . Drug use: No  . Sexual activity: Not Currently  Other Topics Concern  . Not on file  Social History Narrative   Mrs. Mccaffery is widowed. Her daughter, [name], is her POA and lives 12 miles away. She resides in her own home, alone for last 7 years. Since her CVA she has had someone with her most of the time. She has a hired caregiver or a family member stays most of the day. She feel safe in her home and in her neighborhood.   Right-handed.   0.5 cups caffeine per day.   One story home   Social Determinants of Health   Financial Resource Strain: Low Risk  (01/10/2019)   Overall Financial Resource Strain (CARDIA)   . Difficulty of Paying Living Expenses: Not hard at all  Food Insecurity: No Food Insecurity (01/10/2019)   Hunger Vital Sign   . Worried About Programme researcher, broadcasting/film/video in the Last Year: Never true   . Ran Out of Food in the Last Year: Never true  Transportation Needs: No Transportation Needs (01/10/2019)   PRAPARE - Transportation   . Lack of Transportation (Medical): No   . Lack of Transportation (Non-Medical): No  Physical Activity: Inactive (01/10/2019)   Exercise Vital Sign   . Days of Exercise per Week: 0 days   . Minutes of Exercise per Session: 0 min  Stress: No Stress Concern Present (01/10/2019)   [employer] of Occupational Health - Occupational Stress Questionnaire   . Feeling of Stress : Not at all  Social Connections: Somewhat Isolated (01/10/2019)   Social Connection and Isolation Panel [NHANES]   . Frequency of Communication with Friends and Family: More than three times a week   . Frequency of Social Gatherings with Friends and Family: More than three times a week   . Attends Religious Services: More than 4 times per year   . Active Member of Clubs or Organizations: No   . Attends Banker Meetings: Never   . Marital Status: Widowed    Review of Systems  Constitutional:  Negative  for chills, fatigue and fever.  HENT:  Negative for congestion, ear pain and sore throat.   Respiratory:  Negative for cough and shortness of breath.   Cardiovascular:  Negative for chest pain and palpitations.  Gastrointestinal:  Negative for abdominal pain, constipation, diarrhea, nausea and vomiting.  Endocrine: Negative for polydipsia, polyphagia and polyuria.  Genitourinary:  Negative for difficulty urinating and dysuria.  Musculoskeletal:  Negative for arthralgias, back pain and myalgias.  Skin:  Positive for wound (Right forearm swelling and redness). Negative for rash.  Neurological:  Negative for headaches.  Psychiatric/Behavioral:  Negative for dysphoric mood. The patient is not nervous/anxious.      Objective:  BP 100/80   Pulse 86   Temp 98.3 F (36.8 C)   Resp 15   Ht 5\' 2"  (1.575  m)   Wt 98 lb (44.5 kg)   LMP  (LMP Unknown)   SpO2 98%   BMI 17.92 kg/m      02/27/2022   11:03 AM 01/13/2022   11:30 AM 12/31/2021    2:39 PM  BP/Weight  Systolic BP 123XX123 [id number]  Diastolic BP 80 70 50  Wt. (Lbs) 98 92 97  BMI 17.92 kg/m2 16.3 kg/m2 17.18 kg/m2    Physical Exam  Diabetic Foot Exam - Simple   No data filed      Lab Results  Component Value Date   WBC 5.7 09/20/2021   HGB 11.9 09/20/2021   HCT 35.1 09/20/2021   PLT 315 09/20/2021   GLUCOSE 75 09/20/2021   CHOL 112 03/18/2021   TRIG 78 03/18/2021   HDL 46 03/18/2021   LDLCALC 50 03/18/2021   ALT 8 09/20/2021   AST 15 09/20/2021   NA 140 09/20/2021   K 5.2 09/20/2021   CL 100 09/20/2021   CREATININE 0.73 09/20/2021   BUN 16 09/20/2021   CO2 27 09/20/2021   TSH 1.460 05/22/2020   HGBA1C 5.3 11/09/2018      Assessment & Plan:   Problem List Items Addressed This Visit   None Visit Diagnoses     Fall at home, initial encounter    -  Primary   Laceration of skin of forearm, right, initial encounter         .  No orders of the defined types were placed in this encounter.   No orders of the  defined types were placed in this encounter.    Follow-up: No follow-ups on file.  An After Visit Summary was printed and given to the patient.  Rochel Brome, MD [facility] [phone]

## 2022-03-07 ENCOUNTER — Encounter (INDEPENDENT_AMBULATORY_CARE_PROVIDER_SITE_OTHER): Payer: Self-pay

## 2022-03-07 ENCOUNTER — Encounter: Payer: Self-pay | Admitting: Neurology

## 2022-03-08 ENCOUNTER — Encounter: Payer: Self-pay | Admitting: Family Medicine

## 2022-03-09 DIAGNOSIS — W19XXXA Unspecified fall, initial encounter: Secondary | ICD-10-CM | POA: Insufficient documentation

## 2022-03-09 DIAGNOSIS — S51811A Laceration without foreign body of right forearm, initial encounter: Secondary | ICD-10-CM | POA: Insufficient documentation

## 2022-03-09 NOTE — Assessment & Plan Note (Signed)
Suture removal. Dressing applied.

## 2022-03-09 NOTE — Assessment & Plan Note (Signed)
Recommend caution to prevent falls.

## 2022-03-11 ENCOUNTER — Other Ambulatory Visit: Payer: Self-pay | Admitting: Neurology

## 2022-03-11 MED ORDER — DIVALPROEX SODIUM 125 MG PO DR TAB
DELAYED_RELEASE_TABLET | ORAL | 0 refills | Status: DC
Start: 2022-03-11 — End: 2022-03-11

## 2022-03-16 ENCOUNTER — Other Ambulatory Visit: Payer: Self-pay | Admitting: Legal Medicine

## 2022-03-16 DIAGNOSIS — F01518 Vascular dementia, unspecified severity, with other behavioral disturbance: Secondary | ICD-10-CM

## 2022-03-19 ENCOUNTER — Ambulatory Visit: Payer: PPO | Admitting: Legal Medicine

## 2022-03-26 ENCOUNTER — Other Ambulatory Visit: Payer: Self-pay

## 2022-03-26 MED ORDER — DIVALPROEX SODIUM ER 500 MG PO TB24
ORAL_TABLET | ORAL | 1 refills | Status: DC
Start: 2022-03-26 — End: 2022-09-10

## 2022-03-26 NOTE — Progress Notes (Unsigned)
Subjective:  Patient ID: [name], female    DOB: 1926-05-21  Age: 88 y.o. MRN: [id number]  Chief Complaint  Patient presents with   Hypertension   Hyperlipidemia   HPI: Pt presents for follow-up of hypertension and hyperlipidemia. Her adult daughter is present to supplement history. Daughter reports pt has poor oral intake. Current weight 101 lbs, BMI 18.47. She has experienced a few falls, nothing recently. Pt states she has a walker but does not use it for ambulation. She is on Eliquis 2.5 mg BID, prescribed by cardiologist Dr [name] for a-fib. Last cardiology office not revealed decrease Acebutolol to 200 mg QOD and began Midodrine 5 mg BID due to frequent falls related to orthostatic hypotension. Pt's daughter reports pt had lower GI bleed a few months ago. She was seen by Dr Melina Copa, GI specialist. Pt's daughter states pt had a CVA when Eliquis was placed of hold previously. Daughter reports the family and specialists used shared decision making and concluded the benefits of Eliquis outweighed risk of bleeding. Pt has continued Eliquis 2.5 mg BID.   Hypertension, follow-up: She was last seen for hypertension 6 months ago.  BP at that visit was 100/68. Management includes Acebutolol 200 mg QOD and Midodrine 5 mg BID. She reports good compliance with treatment. She is not having side effects.  She is following a Regular diet. She is not exercising. She does not smoke. Use of agents associated with hypertension: none.  Outside blood pressures are not being checked.  Pertinent labs: Lab Results  Component Value Date   CHOL 112 03/18/2021   HDL 46 03/18/2021   LDLCALC 50 03/18/2021   TRIG 78 03/18/2021   CHOLHDL 2.4 03/18/2021   Lab Results  Component Value Date   NA 140 09/20/2021   K 5.2 09/20/2021   CREATININE 0.73 09/20/2021   EGFR 77 09/20/2021   GFRNONAA 66 09/25/2020   GLUCOSE 75 09/20/2021     The ASCVD Risk score (Arnett DK, et al., 2019) failed to calculate  for the following reasons:   The 2019 ASCVD risk score is only valid for ages 60 to 48   The patient has a prior MI or stroke diagnosis    Lipid/Cholesterol, Follow-up  Last lipid panel Other pertinent labs  Lab Results  Component Value Date   CHOL 112 03/18/2021   HDL 46 03/18/2021   LDLCALC 50 03/18/2021   TRIG 78 03/18/2021   CHOLHDL 2.4 03/18/2021   Lab Results  Component Value Date   ALT 8 09/20/2021   AST 15 09/20/2021   PLT 315 09/20/2021   TSH 1.460 05/22/2020     She was last seen for this 6 months ago.  Management includes Lipitor 40 mg QD. She reports good compliance with treatment. She is not having side effects.  Current diet: in general, a "healthy" diet   Current exercise: none  Dementia Follow up: Pt has dementia related to cerebral atrophy. Pt has caretakers from 8am until 8 pm daily that assist with ADLs. She is home alone afterwards. Pt's daughter states pt sleeps well.     Current Outpatient Medications on File Prior to Visit  Medication Sig Dispense Refill   acebutolol (SECTRAL) 200 MG capsule Take 1 capsule (200 mg total) by mouth every other day. 45 capsule 3   atorvastatin (LIPITOR) 40 MG tablet TAKE 1 TABLET(40 MG) BY MOUTH DAILY 90 tablet 0   CALCIUM PO Take 250 mg by mouth daily.     Cholecalciferol (  VITAMIN D3) 1000 units CAPS Take 1,000 Units by mouth daily.     divalproex (DEPAKOTE ER) 500 MG 24 hr tablet TAKE 1 TABLET(500 MG) BY MOUTH AT BEDTIME 90 tablet 1   divalproex (DEPAKOTE) 125 MG DR tablet TAKE 1 TABLET BY MOUTH EVERY MORNING 90 tablet 0   ELIQUIS 2.5 MG TABS tablet TAKE 1 TABLET(2.5 MG) BY MOUTH TWICE DAILY 180 tablet 1   midodrine (PROAMATINE) 5 MG tablet Take 1 tablet (5 mg total) by mouth 2 (two) times daily. 180 tablet 3   mirtazapine (REMERON) 30 MG tablet TAKE 1 TABLET(30 MG) BY MOUTH AT BEDTIME 90 tablet 1   pantoprazole (PROTONIX) 40 MG tablet Take 40 mg by mouth daily.     vitamin B-12 (CYANOCOBALAMIN) 500 MCG tablet Take  500 mcg by mouth daily.      No current facility-administered medications on file prior to visit.   Past Medical History:  Diagnosis Date   Acute ischemic stroke 11/08/2018   left postcentral gyri; left frontal   Adjustment disorder with depressed mood 10/16/2019   Alteration consciousness 03/08/2020   Apraxia following cerebrovascular accident 10/16/2019   B12 deficiency 10/16/2019   Bilateral impacted cerumen 04/02/2021   Body mass index (BMI) less than 19 02/16/2020   Chronic kidney disease, stage II (mild) 12/06/2019   Closed fracture of one rib of right side with routine healing 06/30/2017   Confusion    COPD (chronic obstructive pulmonary disease) 11/16/2017   Current use of long term anticoagulation 07/21/2017   Essential hypertension, benign 10/16/2019   First degree atrioventricular block 06/26/2017   Gait abnormality 09/24/2020   Hammer toe 06/26/2017   Hearing loss    Hyperlipidemia 06/26/2017   Laceration of right lower leg 06/18/2020   Major neurocognitive disorder due to Alzheimer's disease 08/16/2021   Malnutrition of moderate degree 12/06/2019   Memory loss    Other specified cardiac arrhythmias 06/26/2017   Pain in limb 06/26/2017   Palpitations 06/26/2017   Paroxysmal atrial fibrillation 06/26/2017   remote history post operatively, CHADS 2 score=2   Premature atrial complex 06/26/2017   Premature ventricular contractions 06/26/2017   PSVT (paroxysmal supraventricular tachycardia) 11/10/2017   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 10/16/2019   Thoracic aorta atherosclerosis 05/22/2020   Traumatic pneumothorax 06/30/2017   Past Surgical History:  Procedure Laterality Date   ABDOMINAL HYSTERECTOMY     SHOULDER SURGERY     TONSILLECTOMY      Family History  Problem Relation Age of Onset   Bone cancer Mother    Breast cancer Father    Diabetes Father    Heart disease Father    Dementia Sister    Breast cancer Sister    Alzheimer's disease  Sister    Dementia Brother    Social History   Socioeconomic History   Marital status: Widowed    Spouse name: Not on file   Number of children: 2   Years of education: 12   Highest education level: High school graduate  Occupational History   Occupation: Retired  Tobacco Use   Smoking status: Never    Passive exposure: Past   Smokeless tobacco: Never  Vaping Use   Vaping Use: Never used  Substance and Sexual Activity   Alcohol use: No   Drug use: No   Sexual activity: Not Currently  Other Topics Concern   Not on file  Social History Narrative   Mrs. Byington is widowed. Her daughter, [name], is her POA and lives  12 miles away. She resides in her own home, alone for last 7 years. Since her CVA she has had someone with her most of the time. She has a hired caregiver or a family member stays most of the day. She feel safe in her home and in her neighborhood.   Right-handed.   0.5 cups caffeine per day.   One story home   Social Determinants of Health   Financial Resource Strain: Low Risk  (01/10/2019)   Overall Financial Resource Strain (CARDIA)    Difficulty of Paying Living Expenses: Not hard at all  Food Insecurity: No Food Insecurity (01/10/2019)   Hunger Vital Sign    Worried About Running Out of Food in the Last Year: Never true    Ran Out of Food in the Last Year: Never true  Transportation Needs: No Transportation Needs (01/10/2019)   PRAPARE - Hydrologist (Medical): No    Lack of Transportation (Non-Medical): No  Physical Activity: Inactive (01/10/2019)   Exercise Vital Sign    Days of Exercise per Week: 0 days    Minutes of Exercise per Session: 0 min  Stress: No Stress Concern Present (01/10/2019)   [place]    Feeling of Stress : Not at all  Social Connections: Somewhat Isolated (01/10/2019)   Social Connection and Isolation Panel [NHANES]    Frequency of  Communication with Friends and Family: More than three times a week    Frequency of Social Gatherings with Friends and Family: More than three times a week    Attends Religious Services: More than 4 times per year    Active Member of [employer] or Organizations: No    Attends Archivist Meetings: Never    Marital Status: Widowed    Review of Systems  Unable to perform ROS: Dementia     Objective:  BP 112/68 (BP Location: Left Arm, Patient Position: Sitting)   Pulse 74   Temp 98.4 F (36.9 C) (Oral)   Ht 5' 2"  (1.575 m)   Wt 101 lb (45.8 kg)   LMP  (LMP Unknown)   SpO2 97%   BMI 18.47 kg/m      03/27/2022   11:08 AM 02/27/2022   11:03 AM 01/13/2022   11:30 AM  BP/Weight  Systolic BP 599 357 017  Diastolic BP 68 80 70  Wt. (Lbs) 101 98 92  BMI 18.47 kg/m2 17.92 kg/m2 16.3 kg/m2    Physical Exam Vitals reviewed.  Constitutional:      Appearance: Normal appearance.     Comments: Thin frame  HENT:     Ears:     Comments: Hearing aids in place bilaterally    Mouth/Throat:     Mouth: Mucous membranes are moist.  Cardiovascular:     Rate and Rhythm: Normal rate and regular rhythm.     Pulses: Normal pulses.     Heart sounds: Normal heart sounds.  Pulmonary:     Effort: Pulmonary effort is normal.     Breath sounds: Normal breath sounds.  Abdominal:     General: Bowel sounds are normal.     Palpations: Abdomen is soft.  Skin:    Capillary Refill: Capillary refill takes less than 2 seconds.  Neurological:     Mental Status: She is alert. Mental status is at baseline.  Psychiatric:        Mood and Affect: Mood normal.  Behavior: Behavior normal.         Lab Results  Component Value Date   WBC 5.7 09/20/2021   HGB 11.9 09/20/2021   HCT 35.1 09/20/2021   PLT 315 09/20/2021   GLUCOSE 75 09/20/2021   CHOL 112 03/18/2021   TRIG 78 03/18/2021   HDL 46 03/18/2021   LDLCALC 50 03/18/2021   ALT 8 09/20/2021   AST 15 09/20/2021   NA 140 09/20/2021    K 5.2 09/20/2021   CL 100 09/20/2021   CREATININE 0.73 09/20/2021   BUN 16 09/20/2021   CO2 27 09/20/2021   TSH 1.460 05/22/2020   HGBA1C 5.3 11/09/2018      Assessment & Plan:   1. Mixed hyperlipidemia-well controlled - CBC With Diff/Platelet - Comprehensive metabolic panel -continue Lipitor 40 mg QD  2. Paroxysmal atrial fibrillation (HCC)-stable - CBC With Diff/Platelet - Comprehensive metabolic panel -continue Eliquis 2.5 mg BID  3. Benign essential hypertension-well controlled - CBC With Diff/Platelet - Comprehensive metabolic panel -continue Sectral 200 mg QOD and Midodrine 5 mg BID  4. Dementia, multiinfarct, with behavioral disturbance (HCC)-stable - CBC With Diff/Platelet - Comprehensive metabolic panel -continue to assist with ADLs -continue Depakote as prescribed  5. Major neurocognitive disorder due to Alzheimer's disease-stable - CBC With Diff/Platelet - Comprehensive metabolic panel -continue to assist with ADLs  6. Acquired thrombophilia ([place]) - CBC With Diff/Platelet  7. Thoracic aorta atherosclerosis (HCC)-stable - CBC With Diff/Platelet - Comprehensive metabolic panel -continue Lipitor 40 mg QD  8. Malnutrition of moderate degree - CBC With Diff/Platelet - Comprehensive metabolic panel  9. Body mass index (BMI) less than 19 - CBC With Diff/Platelet - Comprehensive metabolic panel     We will call you with lab results Continue medication  Push fluids Eat small frequent meals Use walker for ambulation to prevent falls    Follow-up: Return in about 6 months (around 09/27/2022) for chronic f/u.  An After Visit Summary was printed and given to the patient.   I, Rip Harbour, NP, have reviewed all documentation for this visit. The documentation on 03/27/22 for the exam, diagnosis, procedures, and orders are all accurate and complete.   I,Lauren M Auman,acting as a Education administrator for [employer], NP.,have documented all relevant  documentation on the behalf of Rip Harbour, NP,as directed by  Rip Harbour, NP while in the presence of Rip Harbour, NP.   Signed, Rip Harbour, NP [place] [phone]

## 2022-03-27 ENCOUNTER — Ambulatory Visit (INDEPENDENT_AMBULATORY_CARE_PROVIDER_SITE_OTHER): Payer: PPO | Admitting: Nurse Practitioner

## 2022-03-27 ENCOUNTER — Encounter: Payer: Self-pay | Admitting: Nurse Practitioner

## 2022-03-27 VITALS — BP 112/68 | HR 74 | Temp 98.4°F | Ht 62.0 in | Wt 101.0 lb

## 2022-03-27 DIAGNOSIS — D6869 Other thrombophilia: Secondary | ICD-10-CM | POA: Diagnosis not present

## 2022-03-27 DIAGNOSIS — Z681 Body mass index (BMI) 19 or less, adult: Secondary | ICD-10-CM | POA: Diagnosis not present

## 2022-03-27 DIAGNOSIS — J449 Chronic obstructive pulmonary disease, unspecified: Secondary | ICD-10-CM

## 2022-03-27 DIAGNOSIS — I69390 Apraxia following cerebral infarction: Secondary | ICD-10-CM

## 2022-03-27 DIAGNOSIS — I700 Atherosclerosis of aorta: Secondary | ICD-10-CM | POA: Diagnosis not present

## 2022-03-27 DIAGNOSIS — I10 Essential (primary) hypertension: Secondary | ICD-10-CM | POA: Diagnosis not present

## 2022-03-27 DIAGNOSIS — E440 Moderate protein-calorie malnutrition: Secondary | ICD-10-CM

## 2022-03-27 DIAGNOSIS — E782 Mixed hyperlipidemia: Secondary | ICD-10-CM

## 2022-03-27 DIAGNOSIS — F01518 Vascular dementia, unspecified severity, with other behavioral disturbance: Secondary | ICD-10-CM | POA: Diagnosis not present

## 2022-03-27 DIAGNOSIS — F0280 Dementia in other diseases classified elsewhere without behavioral disturbance: Secondary | ICD-10-CM

## 2022-03-27 DIAGNOSIS — G309 Alzheimer's disease, unspecified: Secondary | ICD-10-CM

## 2022-03-27 DIAGNOSIS — I480 Paroxysmal atrial fibrillation: Secondary | ICD-10-CM | POA: Diagnosis not present

## 2022-03-27 NOTE — Patient Instructions (Signed)
We will call you with lab results Continue medication  Push fluids Eat small frequent meals Use walker for ambulation to prevent falls    Fall Prevention in the Home, Adult Falls can cause injuries and can happen to people of all ages. There are many things you can do to make your home safe and to help prevent falls. Ask for help when making these changes. What actions can I take to prevent falls? General Instructions Use good lighting in all rooms. Replace any light bulbs that burn out. Turn on the lights in dark areas. Use night-lights. Keep items that you use often in easy-to-reach places. Lower the shelves around your home if needed. Set up your furniture so you have a clear path. Avoid moving your furniture around. Do not have throw rugs or other things on the floor that can make you trip. Avoid walking on wet floors. If any of your floors are uneven, fix them. Add color or contrast paint or tape to clearly mark and help you see: Grab bars or handrails. First and last steps of staircases. Where the edge of each step is. If you use a stepladder: Make sure that it is fully opened. Do not climb a closed stepladder. Make sure the sides of the stepladder are locked in place. Ask someone to hold the stepladder while you use it. Know where your pets are when moving through your home. What can I do in the bathroom?     Keep the floor dry. Clean up any water on the floor right away. Remove soap buildup in the tub or shower. Use nonskid mats or decals on the floor of the tub or shower. Attach bath mats securely with double-sided, nonslip rug tape. If you need to sit down in the shower, use a plastic, nonslip stool. Install grab bars by the toilet and in the tub and shower. Do not use towel bars as grab bars. What can I do in the bedroom? Make sure that you have a light by your bed that is easy to reach. Do not use any sheets or blankets for your bed that hang to the floor. Have a  firm chair with side arms that you can use for support when you get dressed. What can I do in the kitchen? Clean up any spills right away. If you need to reach something above you, use a step stool with a grab bar. Keep electrical cords out of the way. Do not use floor polish or wax that makes floors slippery. What can I do with my stairs? Do not leave any items on the stairs. Make sure that you have a light switch at the top and the bottom of the stairs. Make sure that there are handrails on both sides of the stairs. Fix handrails that are broken or loose. Install nonslip stair treads on all your stairs. Avoid having throw rugs at the top or bottom of the stairs. Choose a carpet that does not hide the edge of the steps on the stairs. Check carpeting to make sure that it is firmly attached to the stairs. Fix carpet that is loose or worn. What can I do on the outside of my home? Use bright outdoor lighting. Fix the edges of walkways and driveways and fix any cracks. Remove anything that might make you trip as you walk through a door, such as a raised step or threshold. Trim any bushes or trees on paths to your home. Check to see if handrails are loose  or broken and that both sides of all steps have handrails. Install guardrails along the edges of any raised decks and porches. Clear paths of anything that can make you trip, such as tools or rocks. Have leaves, snow, or ice cleared regularly. Use sand or salt on paths during winter. Clean up any spills in your garage right away. This includes grease or oil spills. What other actions can I take? Wear shoes that: Have a low heel. Do not wear high heels. Have rubber bottoms. Feel good on your feet and fit well. Are closed at the toe. Do not wear open-toe sandals. Use tools that help you move around if needed. These include: Canes. Walkers. Scooters. Crutches. Review your medicines with your doctor. Some medicines can make you feel  dizzy. This can increase your chance of falling. Ask your doctor what else you can do to help prevent falls. Where to find more information Centers for Disease Control and Prevention, [employer]: FootballExhibition.com.br [employer] on Aging: https://walker.com/ Contact a doctor if: You are afraid of falling at home. You feel weak, drowsy, or dizzy at home. You fall at home. Summary There are many simple things that you can do to make your home safe and to help prevent falls. Ways to make your home safe include removing things that can make you trip and installing grab bars in the bathroom. Ask for help when making these changes in your home. This information is not intended to replace advice given to you by your health care provider. Make sure you discuss any questions you have with your health care provider. Document Revised: 05/20/2021 Document Reviewed: 03/21/2020 Elsevier Patient Education  2023 [employer].

## 2022-03-28 LAB — COMPREHENSIVE METABOLIC PANEL
ALT: 10 IU/L (ref 0–32)
AST: 16 IU/L (ref 0–40)
Albumin/Globulin Ratio: 1.6 (ref 1.2–2.2)
Albumin: 3.9 g/dL (ref 3.6–4.6)
Alkaline Phosphatase: 59 IU/L (ref 44–121)
BUN/Creatinine Ratio: 22 (ref 12–28)
BUN: 19 mg/dL (ref 10–36)
Bilirubin Total: 0.3 mg/dL (ref 0.0–1.2)
CO2: 27 mmol/L (ref 20–29)
Calcium: 9.2 mg/dL (ref 8.7–10.3)
Chloride: 101 mmol/L (ref 96–106)
Creatinine, Ser: 0.86 mg/dL (ref 0.57–1.00)
Globulin, Total: 2.5 g/dL (ref 1.5–4.5)
Glucose: 78 mg/dL (ref 70–99)
Potassium: 4.8 mmol/L (ref 3.5–5.2)
Sodium: 140 mmol/L (ref 134–144)
Total Protein: 6.4 g/dL (ref 6.0–8.5)
eGFR: 63 mL/min/{1.73_m2} (ref >59)

## 2022-03-28 LAB — CBC WITH DIFF/PLATELET
Basophils Absolute: 0 10*3/uL (ref 0.0–0.2)
Basos: 0 %
EOS (ABSOLUTE): 0.1 10*3/uL (ref 0.0–0.4)
Eos: 2 %
Hematocrit: 35.5 % (ref 34.0–46.6)
Hemoglobin: 11.5 g/dL (ref 11.1–15.9)
Immature Grans (Abs): 0 10*3/uL (ref 0.0–0.1)
Immature Granulocytes: 1 %
Lymphocytes Absolute: 2.1 10*3/uL (ref 0.7–3.1)
Lymphs: 39 %
MCH: 33.4 pg — ABNORMAL HIGH (ref 26.6–33.0)
MCHC: 32.4 g/dL (ref 31.5–35.7)
MCV: 103 fL — ABNORMAL HIGH (ref 79–97)
Monocytes Absolute: 0.7 10*3/uL (ref 0.1–0.9)
Monocytes: 13 %
Neutrophils Absolute: 2.4 10*3/uL (ref 1.4–7.0)
Neutrophils: 45 %
Platelets: 197 10*3/uL (ref 150–450)
RBC: 3.44 x10E6/uL — ABNORMAL LOW (ref 3.77–5.28)
RDW: 12.3 % (ref 11.7–15.4)
WBC: 5.4 10*3/uL (ref 3.4–10.8)

## 2022-04-01 HISTORY — PX: HIP SURGERY: SHX245

## 2022-04-11 ENCOUNTER — Encounter: Payer: Self-pay | Admitting: Nurse Practitioner

## 2022-04-15 DIAGNOSIS — I9589 Other hypotension: Secondary | ICD-10-CM | POA: Diagnosis not present

## 2022-04-15 DIAGNOSIS — I10 Essential (primary) hypertension: Secondary | ICD-10-CM | POA: Diagnosis not present

## 2022-04-15 DIAGNOSIS — Z7401 Bed confinement status: Secondary | ICD-10-CM | POA: Diagnosis not present

## 2022-04-15 DIAGNOSIS — M419 Scoliosis, unspecified: Secondary | ICD-10-CM | POA: Diagnosis not present

## 2022-04-15 DIAGNOSIS — E785 Hyperlipidemia, unspecified: Secondary | ICD-10-CM | POA: Diagnosis not present

## 2022-04-15 DIAGNOSIS — S72142D Displaced intertrochanteric fracture of left femur, subsequent encounter for closed fracture with routine healing: Secondary | ICD-10-CM | POA: Diagnosis not present

## 2022-04-15 DIAGNOSIS — Z681 Body mass index (BMI) 19 or less, adult: Secondary | ICD-10-CM | POA: Diagnosis not present

## 2022-04-15 DIAGNOSIS — R279 Unspecified lack of coordination: Secondary | ICD-10-CM | POA: Diagnosis not present

## 2022-04-15 DIAGNOSIS — M25552 Pain in left hip: Secondary | ICD-10-CM | POA: Diagnosis not present

## 2022-04-15 DIAGNOSIS — S2243XA Multiple fractures of ribs, bilateral, initial encounter for closed fracture: Secondary | ICD-10-CM | POA: Diagnosis not present

## 2022-04-15 DIAGNOSIS — Z043 Encounter for examination and observation following other accident: Secondary | ICD-10-CM | POA: Diagnosis not present

## 2022-04-15 DIAGNOSIS — I2510 Atherosclerotic heart disease of native coronary artery without angina pectoris: Secondary | ICD-10-CM | POA: Diagnosis not present

## 2022-04-15 DIAGNOSIS — F0390 Unspecified dementia without behavioral disturbance: Secondary | ICD-10-CM | POA: Diagnosis not present

## 2022-04-15 DIAGNOSIS — W19XXXA Unspecified fall, initial encounter: Secondary | ICD-10-CM | POA: Diagnosis not present

## 2022-04-15 DIAGNOSIS — Z7901 Long term (current) use of anticoagulants: Secondary | ICD-10-CM | POA: Diagnosis not present

## 2022-04-15 DIAGNOSIS — M810 Age-related osteoporosis without current pathological fracture: Secondary | ICD-10-CM | POA: Diagnosis not present

## 2022-04-15 DIAGNOSIS — Z8673 Personal history of transient ischemic attack (TIA), and cerebral infarction without residual deficits: Secondary | ICD-10-CM | POA: Diagnosis not present

## 2022-04-15 DIAGNOSIS — Z882 Allergy status to sulfonamides status: Secondary | ICD-10-CM | POA: Diagnosis not present

## 2022-04-15 DIAGNOSIS — Z90710 Acquired absence of both cervix and uterus: Secondary | ICD-10-CM | POA: Diagnosis not present

## 2022-04-15 DIAGNOSIS — F419 Anxiety disorder, unspecified: Secondary | ICD-10-CM | POA: Diagnosis not present

## 2022-04-15 DIAGNOSIS — Z885 Allergy status to narcotic agent status: Secondary | ICD-10-CM | POA: Diagnosis not present

## 2022-04-15 DIAGNOSIS — G309 Alzheimer's disease, unspecified: Secondary | ICD-10-CM | POA: Diagnosis not present

## 2022-04-15 DIAGNOSIS — F02C18 Dementia in other diseases classified elsewhere, severe, with other behavioral disturbance: Secondary | ICD-10-CM | POA: Diagnosis not present

## 2022-04-15 DIAGNOSIS — D62 Acute posthemorrhagic anemia: Secondary | ICD-10-CM | POA: Diagnosis not present

## 2022-04-15 DIAGNOSIS — R296 Repeated falls: Secondary | ICD-10-CM | POA: Diagnosis not present

## 2022-04-15 DIAGNOSIS — K219 Gastro-esophageal reflux disease without esophagitis: Secondary | ICD-10-CM | POA: Diagnosis not present

## 2022-04-15 DIAGNOSIS — I959 Hypotension, unspecified: Secondary | ICD-10-CM | POA: Diagnosis not present

## 2022-04-15 DIAGNOSIS — F411 Generalized anxiety disorder: Secondary | ICD-10-CM | POA: Diagnosis not present

## 2022-04-15 DIAGNOSIS — R5381 Other malaise: Secondary | ICD-10-CM | POA: Diagnosis not present

## 2022-04-15 DIAGNOSIS — Z66 Do not resuscitate: Secondary | ICD-10-CM | POA: Diagnosis not present

## 2022-04-15 DIAGNOSIS — E7800 Pure hypercholesterolemia, unspecified: Secondary | ICD-10-CM | POA: Diagnosis not present

## 2022-04-15 DIAGNOSIS — E46 Unspecified protein-calorie malnutrition: Secondary | ICD-10-CM | POA: Diagnosis not present

## 2022-04-15 DIAGNOSIS — Z880 Allergy status to penicillin: Secondary | ICD-10-CM | POA: Diagnosis not present

## 2022-04-15 DIAGNOSIS — F02818 Dementia in other diseases classified elsewhere, unspecified severity, with other behavioral disturbance: Secondary | ICD-10-CM | POA: Diagnosis not present

## 2022-04-15 DIAGNOSIS — F331 Major depressive disorder, recurrent, moderate: Secondary | ICD-10-CM | POA: Diagnosis not present

## 2022-04-15 DIAGNOSIS — E43 Unspecified severe protein-calorie malnutrition: Secondary | ICD-10-CM | POA: Diagnosis not present

## 2022-04-15 DIAGNOSIS — W19XXXD Unspecified fall, subsequent encounter: Secondary | ICD-10-CM | POA: Diagnosis not present

## 2022-04-15 DIAGNOSIS — I700 Atherosclerosis of aorta: Secondary | ICD-10-CM | POA: Diagnosis not present

## 2022-04-15 DIAGNOSIS — M1990 Unspecified osteoarthritis, unspecified site: Secondary | ICD-10-CM | POA: Diagnosis not present

## 2022-04-15 DIAGNOSIS — Z86718 Personal history of other venous thrombosis and embolism: Secondary | ICD-10-CM | POA: Diagnosis not present

## 2022-04-15 DIAGNOSIS — R1311 Dysphagia, oral phase: Secondary | ICD-10-CM | POA: Diagnosis not present

## 2022-04-15 DIAGNOSIS — D539 Nutritional anemia, unspecified: Secondary | ICD-10-CM | POA: Diagnosis not present

## 2022-04-15 DIAGNOSIS — M6281 Muscle weakness (generalized): Secondary | ICD-10-CM | POA: Diagnosis not present

## 2022-04-15 DIAGNOSIS — F03918 Unspecified dementia, unspecified severity, with other behavioral disturbance: Secondary | ICD-10-CM | POA: Diagnosis not present

## 2022-04-15 DIAGNOSIS — Z79899 Other long term (current) drug therapy: Secondary | ICD-10-CM | POA: Diagnosis not present

## 2022-04-15 DIAGNOSIS — R2681 Unsteadiness on feet: Secondary | ICD-10-CM | POA: Diagnosis not present

## 2022-04-15 DIAGNOSIS — I4891 Unspecified atrial fibrillation: Secondary | ICD-10-CM | POA: Diagnosis not present

## 2022-04-15 DIAGNOSIS — G308 Other Alzheimer's disease: Secondary | ICD-10-CM | POA: Diagnosis not present

## 2022-04-15 DIAGNOSIS — S72142A Displaced intertrochanteric fracture of left femur, initial encounter for closed fracture: Secondary | ICD-10-CM | POA: Diagnosis not present

## 2022-04-15 DIAGNOSIS — Z86711 Personal history of pulmonary embolism: Secondary | ICD-10-CM | POA: Diagnosis not present

## 2022-04-15 DIAGNOSIS — R2689 Other abnormalities of gait and mobility: Secondary | ICD-10-CM | POA: Diagnosis not present

## 2022-04-15 DIAGNOSIS — D638 Anemia in other chronic diseases classified elsewhere: Secondary | ICD-10-CM | POA: Diagnosis not present

## 2022-04-20 DIAGNOSIS — R2689 Other abnormalities of gait and mobility: Secondary | ICD-10-CM | POA: Diagnosis not present

## 2022-04-20 DIAGNOSIS — F03918 Unspecified dementia, unspecified severity, with other behavioral disturbance: Secondary | ICD-10-CM | POA: Diagnosis not present

## 2022-04-20 DIAGNOSIS — F413 Other mixed anxiety disorders: Secondary | ICD-10-CM | POA: Diagnosis not present

## 2022-04-20 DIAGNOSIS — R799 Abnormal finding of blood chemistry, unspecified: Secondary | ICD-10-CM | POA: Diagnosis not present

## 2022-04-20 DIAGNOSIS — F39 Unspecified mood [affective] disorder: Secondary | ICD-10-CM | POA: Diagnosis not present

## 2022-04-20 DIAGNOSIS — G8918 Other acute postprocedural pain: Secondary | ICD-10-CM | POA: Diagnosis not present

## 2022-04-20 DIAGNOSIS — E43 Unspecified severe protein-calorie malnutrition: Secondary | ICD-10-CM | POA: Diagnosis not present

## 2022-04-20 DIAGNOSIS — R279 Unspecified lack of coordination: Secondary | ICD-10-CM | POA: Diagnosis not present

## 2022-04-20 DIAGNOSIS — R5381 Other malaise: Secondary | ICD-10-CM | POA: Diagnosis not present

## 2022-04-20 DIAGNOSIS — E46 Unspecified protein-calorie malnutrition: Secondary | ICD-10-CM | POA: Diagnosis not present

## 2022-04-20 DIAGNOSIS — I959 Hypotension, unspecified: Secondary | ICD-10-CM | POA: Diagnosis not present

## 2022-04-20 DIAGNOSIS — D62 Acute posthemorrhagic anemia: Secondary | ICD-10-CM | POA: Diagnosis not present

## 2022-04-20 DIAGNOSIS — L988 Other specified disorders of the skin and subcutaneous tissue: Secondary | ICD-10-CM | POA: Diagnosis not present

## 2022-04-20 DIAGNOSIS — F02818 Dementia in other diseases classified elsewhere, unspecified severity, with other behavioral disturbance: Secondary | ICD-10-CM | POA: Diagnosis not present

## 2022-04-20 DIAGNOSIS — G308 Other Alzheimer's disease: Secondary | ICD-10-CM | POA: Diagnosis not present

## 2022-04-20 DIAGNOSIS — Z7401 Bed confinement status: Secondary | ICD-10-CM | POA: Diagnosis not present

## 2022-04-20 DIAGNOSIS — K219 Gastro-esophageal reflux disease without esophagitis: Secondary | ICD-10-CM | POA: Diagnosis not present

## 2022-04-20 DIAGNOSIS — F0390 Unspecified dementia without behavioral disturbance: Secondary | ICD-10-CM | POA: Diagnosis not present

## 2022-04-20 DIAGNOSIS — F02C18 Dementia in other diseases classified elsewhere, severe, with other behavioral disturbance: Secondary | ICD-10-CM | POA: Diagnosis not present

## 2022-04-20 DIAGNOSIS — F331 Major depressive disorder, recurrent, moderate: Secondary | ICD-10-CM | POA: Diagnosis not present

## 2022-04-20 DIAGNOSIS — M6281 Muscle weakness (generalized): Secondary | ICD-10-CM | POA: Diagnosis not present

## 2022-04-20 DIAGNOSIS — W19XXXD Unspecified fall, subsequent encounter: Secondary | ICD-10-CM | POA: Diagnosis not present

## 2022-04-20 DIAGNOSIS — F419 Anxiety disorder, unspecified: Secondary | ICD-10-CM | POA: Diagnosis not present

## 2022-04-20 DIAGNOSIS — R262 Difficulty in walking, not elsewhere classified: Secondary | ICD-10-CM | POA: Diagnosis not present

## 2022-04-20 DIAGNOSIS — S72142D Displaced intertrochanteric fracture of left femur, subsequent encounter for closed fracture with routine healing: Secondary | ICD-10-CM | POA: Diagnosis not present

## 2022-04-20 DIAGNOSIS — R1311 Dysphagia, oral phase: Secondary | ICD-10-CM | POA: Diagnosis not present

## 2022-04-20 DIAGNOSIS — D638 Anemia in other chronic diseases classified elsewhere: Secondary | ICD-10-CM | POA: Diagnosis not present

## 2022-04-20 DIAGNOSIS — M810 Age-related osteoporosis without current pathological fracture: Secondary | ICD-10-CM | POA: Diagnosis not present

## 2022-04-20 DIAGNOSIS — S72142A Displaced intertrochanteric fracture of left femur, initial encounter for closed fracture: Secondary | ICD-10-CM | POA: Diagnosis not present

## 2022-04-20 DIAGNOSIS — R2681 Unsteadiness on feet: Secondary | ICD-10-CM | POA: Diagnosis not present

## 2022-04-20 DIAGNOSIS — E785 Hyperlipidemia, unspecified: Secondary | ICD-10-CM | POA: Diagnosis not present

## 2022-04-22 DIAGNOSIS — R262 Difficulty in walking, not elsewhere classified: Secondary | ICD-10-CM | POA: Diagnosis not present

## 2022-04-22 DIAGNOSIS — D62 Acute posthemorrhagic anemia: Secondary | ICD-10-CM | POA: Diagnosis not present

## 2022-04-22 DIAGNOSIS — S72142D Displaced intertrochanteric fracture of left femur, subsequent encounter for closed fracture with routine healing: Secondary | ICD-10-CM | POA: Diagnosis not present

## 2022-04-22 DIAGNOSIS — G8918 Other acute postprocedural pain: Secondary | ICD-10-CM | POA: Diagnosis not present

## 2022-04-30 DIAGNOSIS — L988 Other specified disorders of the skin and subcutaneous tissue: Secondary | ICD-10-CM | POA: Diagnosis not present

## 2022-05-07 DIAGNOSIS — L988 Other specified disorders of the skin and subcutaneous tissue: Secondary | ICD-10-CM | POA: Diagnosis not present

## 2022-05-09 ENCOUNTER — Encounter: Payer: Self-pay | Admitting: Nurse Practitioner

## 2022-05-13 DIAGNOSIS — L988 Other specified disorders of the skin and subcutaneous tissue: Secondary | ICD-10-CM | POA: Diagnosis not present

## 2022-05-17 DIAGNOSIS — G309 Alzheimer's disease, unspecified: Secondary | ICD-10-CM | POA: Diagnosis not present

## 2022-05-17 DIAGNOSIS — E43 Unspecified severe protein-calorie malnutrition: Secondary | ICD-10-CM | POA: Diagnosis not present

## 2022-05-17 DIAGNOSIS — Z8673 Personal history of transient ischemic attack (TIA), and cerebral infarction without residual deficits: Secondary | ICD-10-CM | POA: Diagnosis not present

## 2022-05-17 DIAGNOSIS — F32A Depression, unspecified: Secondary | ICD-10-CM | POA: Diagnosis not present

## 2022-05-17 DIAGNOSIS — H9190 Unspecified hearing loss, unspecified ear: Secondary | ICD-10-CM | POA: Diagnosis not present

## 2022-05-17 DIAGNOSIS — Z7901 Long term (current) use of anticoagulants: Secondary | ICD-10-CM | POA: Diagnosis not present

## 2022-05-17 DIAGNOSIS — Z96642 Presence of left artificial hip joint: Secondary | ICD-10-CM | POA: Diagnosis not present

## 2022-05-17 DIAGNOSIS — Z9181 History of falling: Secondary | ICD-10-CM | POA: Diagnosis not present

## 2022-05-17 DIAGNOSIS — F411 Generalized anxiety disorder: Secondary | ICD-10-CM | POA: Diagnosis not present

## 2022-05-17 DIAGNOSIS — K219 Gastro-esophageal reflux disease without esophagitis: Secondary | ICD-10-CM | POA: Diagnosis not present

## 2022-05-17 DIAGNOSIS — I4891 Unspecified atrial fibrillation: Secondary | ICD-10-CM | POA: Diagnosis not present

## 2022-05-17 DIAGNOSIS — M80052D Age-related osteoporosis with current pathological fracture, left femur, subsequent encounter for fracture with routine healing: Secondary | ICD-10-CM | POA: Diagnosis not present

## 2022-05-17 DIAGNOSIS — F05 Delirium due to known physiological condition: Secondary | ICD-10-CM | POA: Diagnosis not present

## 2022-05-17 DIAGNOSIS — D62 Acute posthemorrhagic anemia: Secondary | ICD-10-CM | POA: Diagnosis not present

## 2022-05-17 DIAGNOSIS — F02811 Dementia in other diseases classified elsewhere, unspecified severity, with agitation: Secondary | ICD-10-CM | POA: Diagnosis not present

## 2022-05-17 DIAGNOSIS — F02818 Dementia in other diseases classified elsewhere, unspecified severity, with other behavioral disturbance: Secondary | ICD-10-CM | POA: Diagnosis not present

## 2022-05-17 DIAGNOSIS — F0283 Dementia in other diseases classified elsewhere, unspecified severity, with mood disturbance: Secondary | ICD-10-CM | POA: Diagnosis not present

## 2022-05-17 DIAGNOSIS — D539 Nutritional anemia, unspecified: Secondary | ICD-10-CM | POA: Diagnosis not present

## 2022-05-17 DIAGNOSIS — F0284 Dementia in other diseases classified elsewhere, unspecified severity, with anxiety: Secondary | ICD-10-CM | POA: Diagnosis not present

## 2022-05-17 DIAGNOSIS — I10 Essential (primary) hypertension: Secondary | ICD-10-CM | POA: Diagnosis not present

## 2022-05-20 ENCOUNTER — Encounter: Payer: Self-pay | Admitting: Legal Medicine

## 2022-05-20 DIAGNOSIS — S72142D Displaced intertrochanteric fracture of left femur, subsequent encounter for closed fracture with routine healing: Secondary | ICD-10-CM | POA: Diagnosis not present

## 2022-05-21 ENCOUNTER — Other Ambulatory Visit: Payer: Self-pay | Admitting: Nurse Practitioner

## 2022-05-21 ENCOUNTER — Telehealth: Payer: Self-pay | Admitting: Nurse Practitioner

## 2022-05-21 NOTE — Telephone Encounter (Signed)
Pt's daughter declined to take pt to ED at this time for evaluation. Daughter has agreed to have stat labs drawn tomorrow and possible IVF infusion on 05/22/22.

## 2022-05-26 ENCOUNTER — Telehealth: Payer: Self-pay

## 2022-05-26 NOTE — Telephone Encounter (Signed)
[facility] called for a verbal for patient. Home PT 2 times a week for one week and once a week for 8 weeks. Verbal given

## 2022-05-27 ENCOUNTER — Telehealth: Payer: Self-pay

## 2022-05-27 DIAGNOSIS — R638 Other symptoms and signs concerning food and fluid intake: Secondary | ICD-10-CM | POA: Diagnosis not present

## 2022-05-27 NOTE — Telephone Encounter (Signed)
[name] from [facility] physical therapy called to request a referral for [place] evaluation to evaluate areas on her foot that may need wound care.  Jerrell Belfast, NP approved the referral.

## 2022-05-27 NOTE — Progress Notes (Signed)
Chronic Care Management Pharmacy Assistant   Name: [name]  MRN: [id number] DOB: [birth year]   Reason for Encounter: General Adherence Call    Recent office visits:  03/27/22 Jerrell Belfast NP. Seen for routine visit. No med changes.  02/27/22 Rochel Brome MD. Seen for laceration. No med changes.  01/21/22 Willeen Cass NP. Seen for Annual Wellness. D/C Melatonin 1mg  and changed Donepezil to 10mg .   12/31/21 Reinaldo Meeker MD. Seen for Alzheimer's. No med changes.  Recent consult visits:  03/07/22 (Neurology) Ellouise Newer MD. Telephone message. Sent in a 30-day prescription for Depakote 125mg : Take 1 tablet every morning.   01/13/22 (Neurology) Ellouise Newer MD. Seen for Alzheimer's. Hold on the Donepezil (Aricept.  12/18/21 (Cardiology) Shirlee More MD. Seen for routine visit. Started on Midodrine 5mg  and Sectral 200mg  every other day. D/C Docusate Sodium 100mg , Lactulose 10g, Lidocaine.  Hospital visits:  02/14/22- No notes available   12/13/21-No notes available   Medications: Outpatient Encounter Medications as of 05/27/2022  Medication Sig   acebutolol (SECTRAL) 200 MG capsule Take 1 capsule (200 mg total) by mouth every other day.   atorvastatin (LIPITOR) 40 MG tablet TAKE 1 TABLET(40 MG) BY MOUTH DAILY   CALCIUM PO Take 250 mg by mouth daily.   Cholecalciferol (VITAMIN D3) 1000 units CAPS Take 1,000 Units by mouth daily.   divalproex (DEPAKOTE ER) 500 MG 24 hr tablet TAKE 1 TABLET(500 MG) BY MOUTH AT BEDTIME   divalproex (DEPAKOTE) 125 MG DR tablet TAKE 1 TABLET BY MOUTH EVERY MORNING   ELIQUIS 2.5 MG TABS tablet TAKE 1 TABLET(2.5 MG) BY MOUTH TWICE DAILY   midodrine (PROAMATINE) 5 MG tablet Take 1 tablet (5 mg total) by mouth 2 (two) times daily.   mirtazapine (REMERON) 30 MG tablet TAKE 1 TABLET(30 MG) BY MOUTH AT BEDTIME   pantoprazole (PROTONIX) 40 MG tablet Take 40 mg by mouth daily.   vitamin B-12 (CYANOCOBALAMIN) 500 MCG tablet Take 500 mcg by mouth daily.     No facility-administered encounter medications on file as of 05/27/2022.    Contacted [name] for General Review Call   Chart Review:  Have there been any documented new, changed, or discontinued medications since last visit? Yes Has there been any documented recent hospitalizations or ED visits since last visit with Clinical Pharmacist? Yes Brief Summary (including medication and/or Diagnosis changes): No notes available    Adherence Review:  Does the Clinical Pharmacist Assistant have access to adherence rates? Yes Adherence rates for STAR metric medications (List medication(s)/day supply/ last 2 fill dates). Atorvastatin 02/17/22-11/21/21 90ds Adherence rates for medications indicated for disease state being reviewed (List medication(s)/day supply/ last 2 fill dates). Does the patient have >5 day gap between last estimated fill dates for any of the above medications or other medication gaps? Yes Reason for medication gaps.Pt is in a memory care facility and they are managing her medications right now   Disease State Questions:  Able to connect with Patient? Yes Did patient have any problems with their health recently? Yes Note problems and Concerns: Have you had any admissions or emergency room visits or worsening of your condition(s) since last visit? Yes Details of ED visit, hospital visit and/or worsening condition(s):She went to [facility] due to a broken hip. Pt is in an assisted living facility right now in a memory care units and daughter does not know when she will get out or if she will get out. Her daughter will follow up with  [place] when she knows something.    Roxana Hires, CMA Clinical Pharmacist Assistant  [phone]

## 2022-05-28 DIAGNOSIS — R9082 White matter disease, unspecified: Secondary | ICD-10-CM | POA: Diagnosis not present

## 2022-05-28 DIAGNOSIS — F0390 Unspecified dementia without behavioral disturbance: Secondary | ICD-10-CM | POA: Diagnosis not present

## 2022-05-28 DIAGNOSIS — S300XXA Contusion of lower back and pelvis, initial encounter: Secondary | ICD-10-CM | POA: Diagnosis not present

## 2022-05-28 DIAGNOSIS — I2510 Atherosclerotic heart disease of native coronary artery without angina pectoris: Secondary | ICD-10-CM | POA: Diagnosis not present

## 2022-05-28 DIAGNOSIS — R000 Tachycardia, unspecified: Secondary | ICD-10-CM | POA: Diagnosis not present

## 2022-05-28 DIAGNOSIS — W19XXXA Unspecified fall, initial encounter: Secondary | ICD-10-CM | POA: Diagnosis not present

## 2022-05-28 DIAGNOSIS — M47812 Spondylosis without myelopathy or radiculopathy, cervical region: Secondary | ICD-10-CM | POA: Diagnosis not present

## 2022-05-28 DIAGNOSIS — Z882 Allergy status to sulfonamides status: Secondary | ICD-10-CM | POA: Diagnosis not present

## 2022-05-28 DIAGNOSIS — R296 Repeated falls: Secondary | ICD-10-CM | POA: Diagnosis not present

## 2022-05-28 DIAGNOSIS — Z885 Allergy status to narcotic agent status: Secondary | ICD-10-CM | POA: Diagnosis not present

## 2022-05-28 DIAGNOSIS — I672 Cerebral atherosclerosis: Secondary | ICD-10-CM | POA: Diagnosis not present

## 2022-05-28 DIAGNOSIS — Z880 Allergy status to penicillin: Secondary | ICD-10-CM | POA: Diagnosis not present

## 2022-05-28 DIAGNOSIS — R102 Pelvic and perineal pain: Secondary | ICD-10-CM | POA: Diagnosis not present

## 2022-05-28 DIAGNOSIS — I639 Cerebral infarction, unspecified: Secondary | ICD-10-CM | POA: Diagnosis not present

## 2022-05-28 DIAGNOSIS — R5381 Other malaise: Secondary | ICD-10-CM | POA: Diagnosis not present

## 2022-05-30 DIAGNOSIS — Z8673 Personal history of transient ischemic attack (TIA), and cerebral infarction without residual deficits: Secondary | ICD-10-CM | POA: Diagnosis not present

## 2022-05-30 DIAGNOSIS — F0283 Dementia in other diseases classified elsewhere, unspecified severity, with mood disturbance: Secondary | ICD-10-CM | POA: Diagnosis not present

## 2022-05-30 DIAGNOSIS — G309 Alzheimer's disease, unspecified: Secondary | ICD-10-CM | POA: Diagnosis not present

## 2022-05-30 DIAGNOSIS — F0284 Dementia in other diseases classified elsewhere, unspecified severity, with anxiety: Secondary | ICD-10-CM | POA: Diagnosis not present

## 2022-05-30 DIAGNOSIS — Z7901 Long term (current) use of anticoagulants: Secondary | ICD-10-CM | POA: Diagnosis not present

## 2022-05-30 DIAGNOSIS — F02811 Dementia in other diseases classified elsewhere, unspecified severity, with agitation: Secondary | ICD-10-CM | POA: Diagnosis not present

## 2022-05-30 DIAGNOSIS — I4891 Unspecified atrial fibrillation: Secondary | ICD-10-CM | POA: Diagnosis not present

## 2022-05-30 DIAGNOSIS — F32A Depression, unspecified: Secondary | ICD-10-CM | POA: Diagnosis not present

## 2022-05-30 DIAGNOSIS — E43 Unspecified severe protein-calorie malnutrition: Secondary | ICD-10-CM | POA: Diagnosis not present

## 2022-05-30 DIAGNOSIS — F411 Generalized anxiety disorder: Secondary | ICD-10-CM | POA: Diagnosis not present

## 2022-05-30 DIAGNOSIS — Z9181 History of falling: Secondary | ICD-10-CM | POA: Diagnosis not present

## 2022-05-30 DIAGNOSIS — F05 Delirium due to known physiological condition: Secondary | ICD-10-CM | POA: Diagnosis not present

## 2022-05-30 DIAGNOSIS — M80052D Age-related osteoporosis with current pathological fracture, left femur, subsequent encounter for fracture with routine healing: Secondary | ICD-10-CM | POA: Diagnosis not present

## 2022-05-30 DIAGNOSIS — Z96642 Presence of left artificial hip joint: Secondary | ICD-10-CM | POA: Diagnosis not present

## 2022-05-30 DIAGNOSIS — D539 Nutritional anemia, unspecified: Secondary | ICD-10-CM | POA: Diagnosis not present

## 2022-05-30 DIAGNOSIS — I10 Essential (primary) hypertension: Secondary | ICD-10-CM | POA: Diagnosis not present

## 2022-05-30 DIAGNOSIS — F02818 Dementia in other diseases classified elsewhere, unspecified severity, with other behavioral disturbance: Secondary | ICD-10-CM | POA: Diagnosis not present

## 2022-05-30 DIAGNOSIS — D62 Acute posthemorrhagic anemia: Secondary | ICD-10-CM | POA: Diagnosis not present

## 2022-05-30 DIAGNOSIS — H9190 Unspecified hearing loss, unspecified ear: Secondary | ICD-10-CM | POA: Diagnosis not present

## 2022-05-30 DIAGNOSIS — K219 Gastro-esophageal reflux disease without esophagitis: Secondary | ICD-10-CM | POA: Diagnosis not present

## 2022-06-02 DIAGNOSIS — Z96642 Presence of left artificial hip joint: Secondary | ICD-10-CM | POA: Diagnosis not present

## 2022-06-02 DIAGNOSIS — F411 Generalized anxiety disorder: Secondary | ICD-10-CM | POA: Diagnosis not present

## 2022-06-02 DIAGNOSIS — E43 Unspecified severe protein-calorie malnutrition: Secondary | ICD-10-CM | POA: Diagnosis not present

## 2022-06-02 DIAGNOSIS — D539 Nutritional anemia, unspecified: Secondary | ICD-10-CM | POA: Diagnosis not present

## 2022-06-02 DIAGNOSIS — G309 Alzheimer's disease, unspecified: Secondary | ICD-10-CM | POA: Diagnosis not present

## 2022-06-02 DIAGNOSIS — I4891 Unspecified atrial fibrillation: Secondary | ICD-10-CM | POA: Diagnosis not present

## 2022-06-02 DIAGNOSIS — F32A Depression, unspecified: Secondary | ICD-10-CM | POA: Diagnosis not present

## 2022-06-02 DIAGNOSIS — Z9181 History of falling: Secondary | ICD-10-CM | POA: Diagnosis not present

## 2022-06-02 DIAGNOSIS — F0284 Dementia in other diseases classified elsewhere, unspecified severity, with anxiety: Secondary | ICD-10-CM | POA: Diagnosis not present

## 2022-06-02 DIAGNOSIS — M80052D Age-related osteoporosis with current pathological fracture, left femur, subsequent encounter for fracture with routine healing: Secondary | ICD-10-CM | POA: Diagnosis not present

## 2022-06-02 DIAGNOSIS — Z8673 Personal history of transient ischemic attack (TIA), and cerebral infarction without residual deficits: Secondary | ICD-10-CM | POA: Diagnosis not present

## 2022-06-02 DIAGNOSIS — F02811 Dementia in other diseases classified elsewhere, unspecified severity, with agitation: Secondary | ICD-10-CM | POA: Diagnosis not present

## 2022-06-02 DIAGNOSIS — K219 Gastro-esophageal reflux disease without esophagitis: Secondary | ICD-10-CM | POA: Diagnosis not present

## 2022-06-02 DIAGNOSIS — Z7901 Long term (current) use of anticoagulants: Secondary | ICD-10-CM | POA: Diagnosis not present

## 2022-06-02 DIAGNOSIS — F02818 Dementia in other diseases classified elsewhere, unspecified severity, with other behavioral disturbance: Secondary | ICD-10-CM | POA: Diagnosis not present

## 2022-06-02 DIAGNOSIS — F0283 Dementia in other diseases classified elsewhere, unspecified severity, with mood disturbance: Secondary | ICD-10-CM | POA: Diagnosis not present

## 2022-06-02 DIAGNOSIS — I10 Essential (primary) hypertension: Secondary | ICD-10-CM | POA: Diagnosis not present

## 2022-06-02 DIAGNOSIS — D62 Acute posthemorrhagic anemia: Secondary | ICD-10-CM | POA: Diagnosis not present

## 2022-06-02 DIAGNOSIS — H9190 Unspecified hearing loss, unspecified ear: Secondary | ICD-10-CM | POA: Diagnosis not present

## 2022-06-02 DIAGNOSIS — F05 Delirium due to known physiological condition: Secondary | ICD-10-CM | POA: Diagnosis not present

## 2022-06-05 ENCOUNTER — Encounter: Payer: Self-pay | Admitting: Nurse Practitioner

## 2022-06-13 DIAGNOSIS — S72143A Displaced intertrochanteric fracture of unspecified femur, initial encounter for closed fracture: Secondary | ICD-10-CM | POA: Diagnosis not present

## 2022-06-13 DIAGNOSIS — S72142D Displaced intertrochanteric fracture of left femur, subsequent encounter for closed fracture with routine healing: Secondary | ICD-10-CM | POA: Diagnosis not present

## 2022-06-16 DIAGNOSIS — G309 Alzheimer's disease, unspecified: Secondary | ICD-10-CM | POA: Diagnosis not present

## 2022-06-16 DIAGNOSIS — K219 Gastro-esophageal reflux disease without esophagitis: Secondary | ICD-10-CM | POA: Diagnosis not present

## 2022-06-16 DIAGNOSIS — Z96642 Presence of left artificial hip joint: Secondary | ICD-10-CM | POA: Diagnosis not present

## 2022-06-16 DIAGNOSIS — Z9181 History of falling: Secondary | ICD-10-CM | POA: Diagnosis not present

## 2022-06-16 DIAGNOSIS — D62 Acute posthemorrhagic anemia: Secondary | ICD-10-CM | POA: Diagnosis not present

## 2022-06-16 DIAGNOSIS — F02811 Dementia in other diseases classified elsewhere, unspecified severity, with agitation: Secondary | ICD-10-CM | POA: Diagnosis not present

## 2022-06-16 DIAGNOSIS — F411 Generalized anxiety disorder: Secondary | ICD-10-CM | POA: Diagnosis not present

## 2022-06-16 DIAGNOSIS — F05 Delirium due to known physiological condition: Secondary | ICD-10-CM | POA: Diagnosis not present

## 2022-06-16 DIAGNOSIS — H9190 Unspecified hearing loss, unspecified ear: Secondary | ICD-10-CM | POA: Diagnosis not present

## 2022-06-16 DIAGNOSIS — F0284 Dementia in other diseases classified elsewhere, unspecified severity, with anxiety: Secondary | ICD-10-CM | POA: Diagnosis not present

## 2022-06-16 DIAGNOSIS — F0283 Dementia in other diseases classified elsewhere, unspecified severity, with mood disturbance: Secondary | ICD-10-CM | POA: Diagnosis not present

## 2022-06-16 DIAGNOSIS — F02818 Dementia in other diseases classified elsewhere, unspecified severity, with other behavioral disturbance: Secondary | ICD-10-CM | POA: Diagnosis not present

## 2022-06-16 DIAGNOSIS — I10 Essential (primary) hypertension: Secondary | ICD-10-CM | POA: Diagnosis not present

## 2022-06-16 DIAGNOSIS — F32A Depression, unspecified: Secondary | ICD-10-CM | POA: Diagnosis not present

## 2022-06-16 DIAGNOSIS — M80052D Age-related osteoporosis with current pathological fracture, left femur, subsequent encounter for fracture with routine healing: Secondary | ICD-10-CM | POA: Diagnosis not present

## 2022-06-16 DIAGNOSIS — Z8673 Personal history of transient ischemic attack (TIA), and cerebral infarction without residual deficits: Secondary | ICD-10-CM | POA: Diagnosis not present

## 2022-06-16 DIAGNOSIS — Z7901 Long term (current) use of anticoagulants: Secondary | ICD-10-CM | POA: Diagnosis not present

## 2022-06-16 DIAGNOSIS — D539 Nutritional anemia, unspecified: Secondary | ICD-10-CM | POA: Diagnosis not present

## 2022-06-16 DIAGNOSIS — I4891 Unspecified atrial fibrillation: Secondary | ICD-10-CM | POA: Diagnosis not present

## 2022-06-16 DIAGNOSIS — E43 Unspecified severe protein-calorie malnutrition: Secondary | ICD-10-CM | POA: Diagnosis not present

## 2022-06-19 ENCOUNTER — Ambulatory Visit: Payer: PPO | Admitting: Cardiology

## 2022-06-19 DIAGNOSIS — S72142D Displaced intertrochanteric fracture of left femur, subsequent encounter for closed fracture with routine healing: Secondary | ICD-10-CM | POA: Diagnosis not present

## 2022-06-24 ENCOUNTER — Encounter (HOSPITAL_BASED_OUTPATIENT_CLINIC_OR_DEPARTMENT_OTHER): Payer: PPO | Attending: General Surgery | Admitting: General Surgery

## 2022-06-24 DIAGNOSIS — L89623 Pressure ulcer of left heel, stage 3: Secondary | ICD-10-CM | POA: Insufficient documentation

## 2022-06-24 DIAGNOSIS — R296 Repeated falls: Secondary | ICD-10-CM | POA: Insufficient documentation

## 2022-06-24 DIAGNOSIS — M19042 Primary osteoarthritis, left hand: Secondary | ICD-10-CM | POA: Diagnosis not present

## 2022-06-24 DIAGNOSIS — E440 Moderate protein-calorie malnutrition: Secondary | ICD-10-CM | POA: Diagnosis not present

## 2022-06-24 DIAGNOSIS — Z7901 Long term (current) use of anticoagulants: Secondary | ICD-10-CM | POA: Insufficient documentation

## 2022-06-24 DIAGNOSIS — I739 Peripheral vascular disease, unspecified: Secondary | ICD-10-CM | POA: Diagnosis not present

## 2022-06-24 DIAGNOSIS — F02C4 Dementia in other diseases classified elsewhere, severe, with anxiety: Secondary | ICD-10-CM | POA: Diagnosis not present

## 2022-06-24 DIAGNOSIS — J449 Chronic obstructive pulmonary disease, unspecified: Secondary | ICD-10-CM | POA: Insufficient documentation

## 2022-06-24 DIAGNOSIS — G309 Alzheimer's disease, unspecified: Secondary | ICD-10-CM | POA: Insufficient documentation

## 2022-06-24 DIAGNOSIS — I480 Paroxysmal atrial fibrillation: Secondary | ICD-10-CM | POA: Diagnosis not present

## 2022-06-24 DIAGNOSIS — I959 Hypotension, unspecified: Secondary | ICD-10-CM | POA: Insufficient documentation

## 2022-06-24 DIAGNOSIS — S6992XA Unspecified injury of left wrist, hand and finger(s), initial encounter: Secondary | ICD-10-CM | POA: Diagnosis not present

## 2022-06-24 DIAGNOSIS — S60012A Contusion of left thumb without damage to nail, initial encounter: Secondary | ICD-10-CM | POA: Diagnosis not present

## 2022-06-24 DIAGNOSIS — Z8781 Personal history of (healed) traumatic fracture: Secondary | ICD-10-CM | POA: Diagnosis not present

## 2022-06-24 DIAGNOSIS — W19XXXA Unspecified fall, initial encounter: Secondary | ICD-10-CM | POA: Diagnosis not present

## 2022-06-24 NOTE — Progress Notes (Signed)
[name], [name] ([id number]) [id number] Page 1 of 2 Visit Report for 06/24/2022 Education Screening Details Patient Name: Date of Service: [name], [place] RIS S. 06/24/2022 1:30 PM Medical Record Number: [id number] Patient Account Number: [id number] Date of Birth/Sex: Treating RN: June 11, 1926 (88 y.o. [name] Primary Care Estalene Bergey: Jerrell Belfast Other Clinician: Referring Kasey Ewings: Treating Carren Blakley/Extender: Robyne Peers in Treatment: 0 Primary Learner Assessed: Patient Learning Preferences/Education Level/Primary Language Preferred Language: English Cognitive Barrier Language Barrier: No Translator Needed: No Memory Deficit: No Emotional Barrier: No Physical Barrier Impaired Hearing: Yes Electronic Signature(s) Signed: 06/24/2022 4:28:24 PM By: Adline Peals Entered By: Adline Peals on 06/24/2022 16:04:06 -------------------------------------------------------------------------------- Fall Risk Assessment Details Patient Name: Date of Service: Katherine Martes, DO RIS S. 06/24/2022 1:30 PM Medical Record Number: [id number] Patient Account Number: [id number] Date of Birth/Sex: Treating RN: 05/07/1926 (88 y.o. [name] Primary Care Sicilia Killough: Jerrell Belfast Other Clinician: Referring Azaya Goedde: Treating Chanceler Pullin/Extender: Cipriano Bunker Weeks in Treatment: 0 Fall Risk Assessment Items Have you had 2 or more falls in the last 12 monthso 0 Yes Have you had any fall that resulted in injury in the last 12 monthso 0 Yes FALLS RISK SCREEN History of falling - immediate or within 3 months 25 Yes Secondary diagnosis (Do you have 2 or more medical diagnoseso) 15 Yes Ambulatory aid None/bed rest/wheelchair/nurse 0 No Crutches/cane/walker 15 Yes Furniture 0 No Intravenous therapy Access/Saline/Heparin Lock 0 No Gait/Transferring Normal/ bed rest/ wheelchair 0 No Weak (short  steps with or without shuffle, stooped but able to lift head while walking, may seek 10 Yes support from furniture) Impaired (short steps with shuffle, may have difficulty arising from chair, head down, impaired 0 No balance) Mental Status Oriented to own ability 0 Yes Overestimates or forgets limitations 0 No Electronic Signature(s) Signed: 06/24/2022 4:28:24 PM By: Adline Peals Entered By: Adline Peals on 06/24/2022 16:03:09 -------------------------------------------------------------------------------- Foot Assessment Details Patient Name: Date of Service: Katherine Martes, DO RIS S. 06/24/2022 1:30 PM Medical Record Number: [id number] Patient Account Number: [id number] Date of Birth/Sex: Treating RN: 1926-04-24 (88 y.o. [name] Primary Care Carle Fenech: Jerrell Belfast Other Clinician: Referring Mikah Rottinghaus: Treating Arvin Abello/Extender: Cipriano Bunker Weeks in Treatment: 0 Foot Assessment Items Site Locations + = Sensation present, - = Sensation absent, C = Callus, U = Ulcer R = Redness, W = Warmth, M = Maceration, PU = Pre-ulcerative lesion F = Fissure, S = Swelling, D = Dryness Assessment Right: Left: Other Deformity: No No Prior Foot Ulcer: No No Prior Amputation: No No Charcot Joint: No No Ambulatory Status: Ambulatory With Help Assistance Device: Walker Gait: Steady Electronic Signature(s) Signed: 06/24/2022 4:28:24 PM By: Adline Peals Entered By: Adline Peals on 06/24/2022 16:03:19

## 2022-06-24 NOTE — Progress Notes (Signed)
[name] ([id number]) [id number] Page 1 of 8 Visit Report for 06/24/2022 Chief Complaint Document Details Patient Name: Date of Service: Katherine Martes, DO RIS Singh. 06/24/2022 1:30 PM Medical Record Number: [id number] Patient Account Number: [id number] Date of Birth/Sex: Treating RN: [birth year] (88 y.o. F) Primary Care Provider: Jerrell Singh Other Clinician: Referring Provider: Treating Provider/Extender: [name] in Treatment: 0 Information Obtained from: Patient Chief Complaint Patient is at the clinic for treatment of an open pressure ulcer Electronic Signature(Singh) Signed: 06/24/2022 2:03:45 PM By: Katherine Maudlin MD FACS Entered By: [name] on 06/24/2022 14:03:44 -------------------------------------------------------------------------------- Debridement Details Patient Name: Date of Service: Katherine Martes, DO RIS Singh. 06/24/2022 1:30 PM Medical Record Number: [id number] Patient Account Number: [id number] Date of Birth/Sex: Treating RN: 1926-08-25 (88 y.o. [name] Primary Care Provider: Jerrell Singh Other Clinician: Referring Provider: Treating Provider/Extender: [name] in Treatment: 0 Debridement Performed for Assessment: Wound #1 Left Calcaneus Performed By: Physician Katherine Maudlin, MD Debridement Type: Debridement Level of Consciousness (Pre-procedure): Awake and Alert Pre-procedure Verification/Time Out Yes - 13:54 Taken: Start Time: 13:54 Pain Control: Lidocaine 4% T opical Solution T Area Debrided (L x W): otal 1.3 (cm) x 1.3 (cm) = 1.69 (cm) Tissue and other material debrided: Non-Viable, Slough, Subcutaneous, Slough Level: Skin/Subcutaneous Tissue Debridement Description: Excisional Instrument: Curette Bleeding: Minimum Hemostasis Achieved: Pressure Response to Treatment: Procedure was tolerated well Level of Consciousness (Post- Awake and  Alert procedure): Post Debridement Measurements of Total Wound Length: (cm) 1.3 Stage: Category/Stage III Width: (cm) 1.3 Depth: (cm) 0.1 Volume: (cm) 0.133 Character of Wound/Ulcer Post Debridement: Improved Post Procedure Diagnosis Same as Pre-procedure [name], [name] ([id number]) [id number] Page 2 of 8 Notes scribed for Dr. Celine Singh by Katherine Peals, RN Electronic Signature(Singh) Signed: 06/24/2022 2:34:04 PM By: Katherine Maudlin MD FACS Signed: 06/24/2022 4:28:24 PM By: Sabas Sous By: [name] on 06/24/2022 13:56:13 -------------------------------------------------------------------------------- HPI Details Patient Name: Date of Service: Katherine Martes, DO RIS Singh. 06/24/2022 1:30 PM Medical Record Number: [id number] Patient Account Number: [id number] Date of Birth/Sex: Treating RN: 09-21-1925 (88 y.o. F) Primary Care Provider: Jerrell Singh Other Clinician: Referring Provider: Treating Provider/Extender: [name] in Treatment: 0 History of Present Illness HPI Description: ADMISSION 06/24/2022 This is a 88 year old woman with fairly advanced dementia, history of stroke, atrial fibrillation, hypertension, chronic anticoagulation, and COPD. She resides in a facility. She is, unfortunately, not accompanied by anyone familiar with her medical history today and so what we are able to obtain is limited to the electronic medical record. She is not diabetic. For an unknown period of time, she has had an ulcer on her left heel. She denies pain. There was a dressing on the wound on intake, but it seems to have been there for quite some time as there was a significant odor present. Once her foot was washed, the odor dissipated. ABI in clinic was normal at 1.04. There is an oval ulceration on her posterior left calcaneus, there is some granulation tissue present which is a bit friable as well as slough and  nonviable subcutaneous tissue. Periwound skin is intact and there is no significant edema. No malodor or purulent drainage. Electronic Signature(Singh) Signed: 06/24/2022 2:06:28 PM By: Katherine Maudlin MD FACS Entered By: [name] on 06/24/2022 14:06:28 -------------------------------------------------------------------------------- Physical Exam Details Patient Name: Date of Service: Katherine Martes, DO RIS Singh. 06/24/2022 1:30 PM Medical Record Number: [id number] Patient Account Number: [id number] Date of Birth/Sex: Treating RN: 1926-02-25 (88 y.o. F) Primary  Care Provider: Flonnie HailstoneHeaton, Singh Other Clinician: Referring Provider: Treating Provider/Extender: Katherine Brookeannon, [name] Singh in Treatment: 0 Constitutional . . . . No acute distress. Respiratory Normal work of breathing on room air.. Notes 06/24/2022: There is an oval ulceration on her posterior left calcaneus, there is some granulation tissue present which is a bit friable as well as slough and nonviable subcutaneous tissue. Periwound skin is intact and there is no significant edema. No malodor or purulent drainage. Electronic Signature(Singh) Signed: 06/24/2022 2:07:14 PM By: [name], Alizeh Madril MD FACS Entered By: [name], [name] on 06/24/2022 14:07:14 [name] ([id number]) [id number] Page 3 of 8 -------------------------------------------------------------------------------- Physician Orders Details Patient Name: Date of Service: Katherine ClementRO BERTS, DO RIS Singh. 06/24/2022 1:30 PM Medical Record Number: [id number] Patient Account Number: [id number] Date of Birth/Sex: Treating RN: 07/26/1926 (88 y.o. Fredderick PhenixF) Singh, Katherine Primary Care Provider: Flonnie HailstoneHeaton, Singh Other Clinician: Referring Provider: Treating Provider/Extender: [name], [name] Singh in Treatment: 0 Verbal / Phone Orders: No Diagnosis Coding ICD-10 Coding Code Description [phone] Pressure ulcer of left heel, stage  3 J44.9 Chronic obstructive pulmonary disease, unspecified G30.9 Alzheimer'Singh disease, unspecified Dementia in other diseases classified elsewhere, unspecified severity, without behavioral disturbance, psychotic disturbance, mood F02.80 disturbance, and anxiety E44.0 Moderate protein-calorie malnutrition R29.6 Repeated falls Z79.01 Long term (current) use of anticoagulants I48.0 Paroxysmal atrial fibrillation I95.9 Hypotension, unspecified Follow-up Appointments ppointment in 1 week. - Dr. Lady Garyannon - room 2 Return A Anesthetic (In clinic) Topical Lidocaine 4% applied to wound bed Bathing/ Shower/ Hygiene May shower and wash wound with soap and water. - with dressing changes Edema Control - Lymphedema / SCD / Other Avoid standing for long periods of time. Off-Loading Other: - stay off of foot as much as possible float heels (no pressure to left heels) prevalon boot to left foot Home Health New wound care orders this week; continue Home Health for wound care. May utilize formulary equivalent dressing for wound treatment orders unless otherwise specified. Other Home Health Orders/Instructions: - [facility] Wound Treatment Wound #1 - Calcaneus Wound Laterality: Left Cleanser: Soap and Water Every Other Day/30 Days Discharge Instructions: May shower and wash wound with dial antibacterial soap and water prior to dressing change. Cleanser: Wound Cleanser Every Other Day/30 Days Discharge Instructions: Cleanse the wound with wound cleanser prior to applying a clean dressing using gauze sponges, not tissue or cotton balls. Prim Dressing: KerraCel Ag Gelling Fiber Dressing, 2x2 in (silver alginate) Every Other Day/30 Days ary Discharge Instructions: Apply silver alginate to wound bed as instructed Secondary Dressing: ALLEVYN Heel 4 1/2in x 5 1/2in / 10.5cm x 13.5cm Every Other Day/30 Days Discharge Instructions: Apply over primary dressing as directed. Secondary Dressing: Woven Gauze Sponge,  Non-Sterile 4x4 in Every Other Day/30 Days Discharge Instructions: Apply over primary dressing as directed. Secured With: [employer] Roll Sterile, 4.5x3.1 (in/yd) Every Other Day/30 Days Discharge Instructions: Secure with Kerlix as directed. Katherine HurdleROBERTS, Deshawn Singh ([id number]) [id number] Page 4 of 8 Secured With: 22M Medipore H Soft Cloth Surgical T ape, 4 x 10 (in/yd) Every Other Day/30 Days Discharge Instructions: Secure with tape as directed. Patient Medications llergies: codeine, penicillin, Sulfa (Sulfonamide Antibiotics) A Notifications Medication Indication Start End 06/24/2022 lidocaine DOSE topical 4 % cream - cream topical Electronic Signature(Singh) Signed: 06/24/2022 2:34:04 PM By: [name], Chazz Philson MD FACS Entered By: [name], Delise Simenson on 06/24/2022 14:07:27 -------------------------------------------------------------------------------- Problem List Details Patient Name: Date of Service: Katherine ClementO BERTS, DO RIS Singh. 06/24/2022 1:30 PM Medical Record Number: [id number] Patient Account Number: [id number] Date of Birth/Sex: Treating RN:  [birth year] (88 y.o. F) Primary Care Provider: [name] Other Clinician: Referring Provider: Treating Provider/Extender: Katherine Brooke Singh in Treatment: 0 Active Problems ICD-10 Encounter Code Description Active Date MDM Diagnosis [phone] Pressure ulcer of left heel, stage 3 06/24/2022 No Yes J44.9 Chronic obstructive pulmonary disease, unspecified 06/24/2022 No Yes G30.9 Alzheimer'Singh disease, unspecified 06/24/2022 No Yes F02.80 Dementia in other diseases classified elsewhere, unspecified severity, without 06/24/2022 No Yes behavioral disturbance, psychotic disturbance, mood disturbance, and anxiety E44.0 Moderate protein-calorie malnutrition 06/24/2022 No Yes R29.6 Repeated falls 06/24/2022 No Yes Z79.01 Long term (current) use of anticoagulants 06/24/2022 No Yes I48.0 Paroxysmal atrial fibrillation  06/24/2022 No Yes I95.9 Hypotension, unspecified 06/24/2022 No Yes [name], [name] ([id number]) [id number] Page 5 of 8 Inactive Problems Resolved Problems Electronic Signature(Singh) Signed: 06/24/2022 2:02:22 PM By: Katherine Guess MD FACS Previous Signature: 06/24/2022 1:35:02 PM Version By: Katherine Guess MD FACS Entered By: Katherine Guess on 06/24/2022 14:02:21 -------------------------------------------------------------------------------- Progress Note Details Patient Name: Date of Service: Katherine Clement, DO RIS Singh. 06/24/2022 1:30 PM Medical Record Number: [id number] Patient Account Number: [id number] Date of Birth/Sex: Treating RN: 07-03-1926 (88 y.o. F) Primary Care Provider: [name] Other Clinician: Referring Provider: Treating Provider/Extender: Katherine Forth in Treatment: 0 Subjective Chief Complaint Information obtained from Patient Patient is at the clinic for treatment of an open pressure ulcer History of Present Illness (HPI) ADMISSION 06/24/2022 This is a 88 year old woman with fairly advanced dementia, history of stroke, atrial fibrillation, hypertension, chronic anticoagulation, and COPD. She resides in a facility. She is, unfortunately, not accompanied by anyone familiar with her medical history today and so what we are able to obtain is limited to the electronic medical record. She is not diabetic. For an unknown period of time, she has had an ulcer on her left heel. She denies pain. There was a dressing on the wound on intake, but it seems to have been there for quite some time as there was a significant odor present. Once her foot was washed, the odor dissipated. ABI in clinic was normal at 1.04. There is an oval ulceration on her posterior left calcaneus, there is some granulation tissue present which is a bit friable as well as slough and nonviable subcutaneous tissue. Periwound skin is intact and there  is no significant edema. No malodor or purulent drainage. Patient History Unable to Obtain Patient History due to Dementia. Allergies codeine, penicillin, Sulfa (Sulfonamide Antibiotics) Medical History Cardiovascular Patient has history of Arrhythmia - a-fib Neurologic Patient has history of Dementia Medical A Surgical History Notes nd Ear/Nose/Mouth/Throat dyspahgia Review of Systems (ROS) Musculoskeletal Complains or has symptoms of Muscle Weakness. Objective Constitutional No acute distress. [name], [name] ([id number]) [id number] Page 6 of 8 Vitals Time Taken: 1:23 AM, Height: 67 in, Temperature: 98.6 F, Pulse: 76 bpm, Respiratory Rate: 20 breaths/min, Blood Pressure: 113/69 mmHg. Respiratory Normal work of breathing on room air.. General Notes: 06/24/2022: There is an oval ulceration on her posterior left calcaneus, there is some granulation tissue present which is a bit friable as well as slough and nonviable subcutaneous tissue. Periwound skin is intact and there is no significant edema. No malodor or purulent drainage. Integumentary (Hair, Skin) Wound #1 status is Open. Original cause of wound was Pressure Injury. The date acquired was: 04/01/2022. The wound is located on the Left Calcaneus. The wound measures 1.3cm length x 1.3cm width x 0.1cm depth; 1.327cm^2 area and 0.133cm^3 volume. There is Fat Layer (Subcutaneous Tissue) exposed. There is no tunneling or undermining  noted. There is a medium amount of serosanguineous drainage noted. The wound margin is distinct with the outline attached to the wound base. There is small (1-33%) red, pink granulation within the wound bed. There is a large (67-100%) amount of necrotic tissue within the wound bed including Adherent Slough. The periwound skin appearance had no abnormalities noted for texture. The periwound skin appearance had no abnormalities noted for moisture. The periwound skin appearance had  no abnormalities noted for color. Periwound temperature was noted as No Abnormality. Assessment Active Problems ICD-10 Pressure ulcer of left heel, stage 3 Chronic obstructive pulmonary disease, unspecified Alzheimer'Singh disease, unspecified Dementia in other diseases classified elsewhere, unspecified severity, without behavioral disturbance, psychotic disturbance, mood disturbance, and anxiety Moderate protein-calorie malnutrition Repeated falls Long term (current) use of anticoagulants Paroxysmal atrial fibrillation Hypotension, unspecified Procedures Wound #1 Pre-procedure diagnosis of Wound #1 is a Pressure Ulcer located on the Left Calcaneus . There was a Excisional Skin/Subcutaneous Tissue Debridement with a total area of 1.69 sq cm performed by Katherine Guess, MD. With the following instrument(Singh): Curette to remove Non-Viable tissue/material. Material removed includes Subcutaneous Tissue and Slough and after achieving pain control using Lidocaine 4% T opical Solution. No specimens were taken. A time out was conducted at 13:54, prior to the start of the procedure. A Minimum amount of bleeding was controlled with Pressure. The procedure was tolerated well. Post Debridement Measurements: 1.3cm length x 1.3cm width x 0.1cm depth; 0.133cm^3 volume. Post debridement Stage noted as Category/Stage III. Character of Wound/Ulcer Post Debridement is improved. Post procedure Diagnosis Wound #1: Same as Pre-Procedure General Notes: scribed for Dr. Lady Gary by Samuella Bruin, RN. Plan Follow-up Appointments: Return Appointment in 1 week. - Dr. Lady Gary - room 2 Anesthetic: (In clinic) Topical Lidocaine 4% applied to wound bed Bathing/ Shower/ Hygiene: May shower and wash wound with soap and water. - with dressing changes Edema Control - Lymphedema / SCD / Other: Avoid standing for long periods of time. Off-Loading: Other: - stay off of foot as much as possible float heels (no pressure to  left heels) prevalon boot to left foot Home Health: New wound care orders this week; continue Home Health for wound care. May utilize formulary equivalent dressing for wound treatment orders unless otherwise specified. Other Home Health Orders/Instructions: [facility] The following medication(Singh) was prescribed: lidocaine topical 4 % cream cream topical was prescribed at facility WOUND #1: - Calcaneus Wound Laterality: Left Cleanser: Soap and Water Every Other Day/30 Days Discharge Instructions: May shower and wash wound with dial antibacterial soap and water prior to dressing change. Cleanser: Wound Cleanser Every Other Day/30 Days Discharge Instructions: Cleanse the wound with wound cleanser prior to applying a clean dressing using gauze sponges, not tissue or cotton balls. Prim Dressing: KerraCel Ag Gelling Fiber Dressing, 2x2 in (silver alginate) Every Other Day/30 Days ary Discharge Instructions: Apply silver alginate to wound bed as instructed Secondary Dressing: ALLEVYN Heel 4 1/2in x 5 1/2in / 10.5cm x 13.5cm Every Other Day/30 Days Discharge Instructions: Apply over primary dressing as directed. Secondary Dressing: Woven Gauze Sponge, Non-Sterile 4x4 in Every Other Day/30 Days Discharge Instructions: Apply over primary dressing as directed. Secured With: [employer], 4.5x3.1 (in/yd) Every Other Day/30 Days [name], [name] ([id number]) [id number] Page 7 of 8 Discharge Instructions: Secure with Kerlix as directed. Secured With: 65M Medipore H Soft Cloth Surgical T ape, 4 x 10 (in/yd) Every Other Day/30 Days Discharge Instructions: Secure with tape as directed. 06/24/2022: There is an oval ulceration on  her posterior left calcaneus, there is some granulation tissue present which is a bit friable as well as slough and nonviable subcutaneous tissue. Periwound skin is intact and there is no significant edema. No malodor or purulent drainage. I used a  curette to debride slough and subcu from her heel wound. We will apply silver alginate and a heel cup. We also provided her with a Prevalon boot and instructions for her facility to aggressively offload the wound and limit her ambulation. She will follow-up in 1 week. Electronic Signature(Singh) Signed: 06/24/2022 2:21:52 PM By: Katherine Guess MD FACS Entered By: Katherine Guess on 06/24/2022 14:21:52 -------------------------------------------------------------------------------- HxROS Details Patient Name: Date of Service: Katherine Clement, DO RIS Singh. 06/24/2022 1:30 PM Medical Record Number: [id number] Patient Account Number: [id number] Date of Birth/Sex: Treating RN: [birth year] (88 y.o. Fredderick Phenix Primary Care Provider: [name] Other Clinician: Referring Provider: Treating Provider/Extender: Katherine Brooke Singh in Treatment: 0 Unable to Obtain Patient History due to Dementia Musculoskeletal Complaints and Symptoms: Positive for: Muscle Weakness Ear/Nose/Mouth/Throat Medical History: Past Medical History Notes: dyspahgia Cardiovascular Medical History: Positive for: Arrhythmia - a-fib Neurologic Medical History: Positive for: Dementia Immunizations Pneumococcal Vaccine: Received Pneumococcal Vaccination: No Implantable Devices No devices added Electronic Signature(Singh) Signed: 06/24/2022 2:34:04 PM By: Katherine Guess MD FACS Signed: 06/24/2022 4:28:24 PM By: Samuella Bruin Entered By: Samuella Bruin on 06/24/2022 13:36:52 Katherine Hurdle ([id number]) [id number] Page 8 of 8 -------------------------------------------------------------------------------- SuperBill Details Patient Name: Date of Service: Katherine Yolanda Manges, DO RIS Singh. 06/24/2022 Medical Record Number: [id number] Patient Account Number: [id number] Date of Birth/Sex: Treating RN: 1925-10-01 (88 y.o. F) Primary Care Provider: [name] Other  Clinician: Referring Provider: Treating Provider/Extender: Katherine Brooke Singh in Treatment: 0 Diagnosis Coding ICD-10 Codes Code Description [id number] Pressure ulcer of left heel, stage 3 J44.9 Chronic obstructive pulmonary disease, unspecified G30.9 Alzheimer'Singh disease, unspecified Dementia in other diseases classified elsewhere, unspecified severity, without behavioral disturbance, psychotic disturbance, mood F02.80 disturbance, and anxiety E44.0 Moderate protein-calorie malnutrition R29.6 Repeated falls Z79.01 Long term (current) use of anticoagulants I48.0 Paroxysmal atrial fibrillation I95.9 Hypotension, unspecified Facility Procedures : CPT4 Code: [id number] Description: 99213 - WOUND CARE VISIT-LEV 3 EST PT Modifier: 25 Quantity: 1 : CPT4 Code: [id number] Description: 11042 - DEB SUBQ TISSUE 20 SQ CM/< ICD-10 Diagnosis Description L89.623 Pressure ulcer of left heel, stage 3 Modifier: Quantity: 1 Physician Procedures : CPT4: Description Modifier Code [id number] 99204 - WC PHYS LEVEL 4 - NEW PT 25 ICD-10 Diagnosis Description L89.623 Pressure ulcer of left heel, stage 3 F02.80 Dementia in other diseases classified elsewhere, unspecified severity, without behavioral  disturbance, p disturbance, mood disturbance, and anxiety E44.0 Moderate protein-calorie malnutrition Z79.01 Long term (current) use of anticoagulants Quantity: 1 sychotic : CPT4: [id number] 11042 - WC PHYS SUBQ TISS 20 SQ CM ICD-10 Diagnosis Description L89.623 Pressure ulcer of left heel, stage 3 Quantity: 1 Electronic Signature(Singh) Signed: 06/24/2022 4:11:14 PM By: Katherine Guess MD FACS Signed: 06/24/2022 4:28:24 PM By: Samuella Bruin Previous Signature: 06/24/2022 2:22:12 PM Version By: Katherine Guess MD FACS Entered By: Samuella Bruin on 06/24/2022 16:02:07

## 2022-06-24 NOTE — Progress Notes (Signed)
CHELSIE, BUREL ([id number]) [id number] Page 1 of 8 Visit Report for 06/24/2022 Allergy List Details Patient Name: Date of Service: Katherine Clement, DO RIS S. 06/24/2022 1:30 PM Medical Record Number: [id number] Patient Account Number: [id number] Date of Birth/Sex: Treating RN: 1926/03/15 (88 y.o. Fredderick Phenix Primary Care Braniya Farrugia: [name] Other Clinician: Referring Kealy Lewter: Treating Daltyn Degroat/Extender: [name] in Treatment: 0 Allergies Active Allergies codeine penicillin Sulfa (Sulfonamide Antibiotics) Allergy Notes Electronic Signature(s) Signed: 06/24/2022 4:28:24 PM By: Samuella Bruin Entered By: Samuella Bruin on 06/24/2022 13:29:25 -------------------------------------------------------------------------------- Arrival Information Details Patient Name: Date of Service: Katherine Clement, DO RIS S. 06/24/2022 1:30 PM Medical Record Number: [id number] Patient Account Number: [id number] Date of Birth/Sex: Treating RN: June 12, 1926 (88 y.o. F) Primary Care Antony Sian: [name] Other Clinician: Referring Annalissa Murphey: Treating Jakyia Gaccione/Extender: [name] in Treatment: 0 Visit Information Patient Arrived: Cloyde Reams Time: 13:19 Accompanied By: staff Transfer Assistance: None Patient Identification Verified: Yes Secondary Verification Process Completed: Yes Patient Requires Transmission-Based Precautions: No Patient Has Alerts: No Electronic Signature(s) Signed: 06/24/2022 4:13:28 PM By: Dayton Scrape Entered By: Dayton Scrape on 06/24/2022 13:22:01 Clyda Hurdle ([id number]) [id number] Page 2 of 8 -------------------------------------------------------------------------------- Clinic Level of Care Assessment Details Patient Name: Date of Service: Katherine Clement, DO RIS S. 06/24/2022 1:30 PM Medical Record Number: [id number] Patient Account Number:  [id number] Date of Birth/Sex: Treating RN: [birth year] (88 y.o. Fredderick Phenix Primary Care Zaylen Susman: [name] Other Clinician: Referring Bach Rocchi: Treating Clifford Benninger/Extender: [name] in Treatment: 0 Clinic Level of Care Assessment Items TOOL 1 Quantity Score X- 1 0 Use when EandM and Procedure is performed on INITIAL visit ASSESSMENTS - Nursing Assessment / Reassessment X- 1 20 General Physical Exam (combine w/ comprehensive assessment (listed just below) when performed on new pt. evals) X- 1 25 Comprehensive Assessment (HX, ROS, Risk Assessments, Wounds Hx, etc.) ASSESSMENTS - Wound and Skin Assessment / Reassessment []  - 0 Dermatologic / Skin Assessment (not related to wound area) ASSESSMENTS - Ostomy and/or Continence Assessment and Care []  - 0 Incontinence Assessment and Management []  - 0 Ostomy Care Assessment and Management (repouching, etc.) PROCESS - Coordination of Care X - Simple Patient / Family Education for ongoing care 1 15 []  - 0 Complex (extensive) Patient / Family Education for ongoing care X- 1 10 Staff obtains , Records, T Results / Process Orders est []  - 0 Staff telephones HHA, Nursing Homes / Clarify orders / etc []  - 0 Routine Transfer to another Facility (non-emergent condition) []  - 0 Routine Hospital Admission (non-emergent condition) X- 1 15 New Admissions / / Ordering NPWT Apligraf, etc. , []  - 0 Emergency Hospital Admission (emergent condition) PROCESS - Special Needs []  - 0 Pediatric / Minor Patient Management []  - 0 Isolation Patient Management []  - 0 Hearing / Language / Visual special needs []  - 0 Assessment of Community assistance (transportation, D/C planning, etc.) []  - 0 Additional assistance / Altered mentation []  - 0 Support Surface(s) Assessment (bed, cushion, seat, etc.) INTERVENTIONS - Miscellaneous []  - 0 External ear exam []  - 0 Patient  Transfer (multiple staff / / Similar devices) []  - 0 Simple Staple / Suture removal (25 or less) []  - 0 Complex Staple / Suture removal (26 or more) []  - 0 Hypo/Hyperglycemic Management (do not check if billed separately) X- 1 15 Ankle / Brachial Index (ABI) - do not check if billed separately Has the patient been seen at the hospital within the  last three years: Yes Total Score: 100 Level Of Care: New/Established - Level 3 Electronic Signature(s) Signed: 06/24/2022 4:28:24 PM By: Samuella Bruin Entered By: Samuella Bruin on 06/24/2022 16:01:59 Clyda Hurdle ([id number]) [id number] Page 3 of 8 -------------------------------------------------------------------------------- Encounter Discharge Information Details Patient Name: Date of Service: Katherine Clement, DO RIS S. 06/24/2022 1:30 PM Medical Record Number: [id number] Patient Account Number: [id number] Date of Birth/Sex: Treating RN: 05/16/1926 (88 y.o. Fredderick Phenix Primary Care Johntavius Shepard: [name] Other Clinician: Referring Kiah Keay: Treating Lexington Krotz/Extender: [name] in Treatment: 0 Encounter Discharge Information Items Post Procedure Vitals Discharge Condition: Stable Temperature (F): 98.6 Ambulatory Status: Walker Pulse (bpm): 76 Discharge Destination: Skilled Nursing Facility Respiratory Rate (breaths/min): 20 Telephoned: No Blood Pressure (mmHg): 113/69 Orders Sent: Yes Transportation: Private Auto Accompanied By: caregiver Schedule Follow-up Appointment: Yes Clinical Summary of Care: Patient Declined Electronic Signature(s) Signed: 06/24/2022 4:28:24 PM By: Samuella Bruin Entered By: Samuella Bruin on 06/24/2022 16:02:47 -------------------------------------------------------------------------------- Lower Extremity Assessment Details Patient Name: Date of Service: Katherine Clement, DO RIS S. 06/24/2022 1:30 PM Medical  Record Number: [id number] Patient Account Number: [id number] Date of Birth/Sex: Treating RN: 1926/06/17 (88 y.o. Fredderick Phenix Primary Care Siah Kannan: [name] Other Clinician: Referring Karlyn Glasco: Treating Rafal Archuleta/Extender: [name] in Treatment: 0 Edema Assessment Assessed: [Left: No] [Right: No] [Left: Edema] [Right: :] Calf Left: Right: Point of Measurement: From Medial Instep 28.5 cm Ankle Left: Right: Point of Measurement: From Medial Instep 18.5 cm Vascular Assessment Pulses: Dorsalis Pedis Palpable: [Left:Yes] Blood Pressure: Brachial: [Left:113] Ankle: [Left:Dorsalis Pedis: 118 1.04] Electronic Signature(s) Signed: 06/24/2022 4:28:24 PM By: Alesia Banda ([id number]) PM By: Welford Roche.pdf Page 4 of 8 Signed: 06/24/2022 4:28:24 aylor Entered By: Samuella Bruin on 06/24/2022 16:03:31 -------------------------------------------------------------------------------- Multi Wound Chart Details Patient Name: Date of Service: Katherine Clement, DO RIS S. 06/24/2022 1:30 PM Medical Record Number: [id number] Patient Account Number: [id number] Date of Birth/Sex: Treating RN: 08-31-1926 (88 y.o. F) Primary Care Meliza Kage: [name] Other Clinician: Referring Exzavier Ruderman: Treating Ivory Maduro/Extender: [name] in Treatment: 0 Vital Signs Height(in): 67 Pulse(bpm): 76 Weight(lbs): Blood Pressure(mmHg): 113/69 Body Mass Index(BMI): Temperature(F): 98.6 Respiratory Rate(breaths/min): 20 [1:Photos: No Photos Left Calcaneus Wound Location: Pressure Injury Wounding Event: Pressure Ulcer Primary Etiology: Arrhythmia, Dementia Comorbid History: 04/01/2022 Date Acquired: 0 Weeks of Treatment: Open Wound Status: No Wound Recurrence: 1.3x1.3x0.1  Measurements L x W x D (cm) 1.327 A (cm) : rea 0.133 Volume (cm) : Category/Stage III Classification: Medium  Exudate A mount: Serosanguineous Exudate Type: red, brown Exudate Color: Distinct, outline attached Wound Margin: Small (1-33%) Granulation A  mount: Red, Pink Granulation Quality: Large (67-100%) Necrotic A mount: Fat Layer (Subcutaneous Tissue): Yes N/A Exposed Structures: Fascia: No Tendon: No Muscle: No Joint: No Bone: No None Epithelialization: Debridement - Excisional Debridement:  Pre-procedure Verification/Time Out 13:54 Taken: Lidocaine 4% Topical Solution Pain Control: Subcutaneous, Slough Tissue Debrided: Skin/Subcutaneous Tissue Level: 1.69 Debridement A (sq cm): rea Curette Instrument: Minimum Bleeding: Pressure Hemostasis A  chieved: Procedure was tolerated well Debridement Treatment Response: 1.3x1.3x0.1 Post Debridement Measurements L x W x D (cm) 0.133 Post Debridement Volume: (cm) Category/Stage III Post Debridement Stage: No Abnormalities Noted Periwound Skin Texture:  No Abnormalities Noted Periwound Skin Moisture: No Abnormalities Noted Periwound Skin Color: No Abnormality Temperature: Debridement Procedures Performed:] [N/A:N/A N/A N/A N/A N/A N/A N/A N/A N/A N/A N/A N/A N/A N/A N/A N/A N/A N/A N/A N/A N/A N/A N/A  N/A N/A N/A N/A N/A N/A  N/A N/A N/A N/A N/A N/A N/A N/A N/A N/A] Treatment Notes SHRON, OZER ([id number]) [id number] Page 5 of 8 Electronic Signature(s) Signed: 06/24/2022 2:03:35 PM By: Duanne Guess MD FACS Entered By: Duanne Guess on 06/24/2022 14:03:34 -------------------------------------------------------------------------------- Multi-Disciplinary Care Plan Details Patient Name: Date of Service: Katherine Clement, DO RIS S. 06/24/2022 1:30 PM Medical Record Number: [id number] Patient Account Number: [id number] Date of Birth/Sex: Treating RN: 12/31/1925 (88 y.o. Fredderick Phenix Primary Care Amedeo Detweiler: [name] Other Clinician: Referring Vasily Fedewa: Treating Kemba Hoppes/Extender: [name] in  Treatment: 0 Active Inactive Pressure Nursing Diagnoses: Knowledge deficit related to causes and risk factors for pressure ulcer development Knowledge deficit related to management of pressures ulcers Goals: Patient will remain free from development of additional pressure ulcers Date Initiated: 06/24/2022 Target Resolution Date: 08/08/2022 Goal Status: Active Patient/caregiver will verbalize risk factors for pressure ulcer development Date Initiated: 06/24/2022 Target Resolution Date: 08/08/2022 Goal Status: Active Interventions: Assess: immobility, friction, shearing, incontinence upon admission and as needed Treatment Activities: Pressure reduction/relief device ordered : 06/24/2022 Notes: Wound/Skin Impairment Nursing Diagnoses: Impaired tissue integrity Knowledge deficit related to ulceration/compromised skin integrity Goals: Patient/caregiver will verbalize understanding of skin care regimen Date Initiated: 06/24/2022 Target Resolution Date: 08/08/2022 Goal Status: Active Interventions: Assess ulceration(s) every visit Treatment Activities: Skin care regimen initiated : 06/24/2022 Topical wound management initiated : 06/24/2022 Notes: Electronic Signature(s) Signed: 06/24/2022 4:28:24 PM By: Samuella Bruin Entered By: Samuella Bruin on 06/24/2022 16:01:15 Clyda Hurdle ([id number]) [id number] Page 6 of 8 -------------------------------------------------------------------------------- Pain Assessment Details Patient Name: Date of Service: Katherine Clement, DO RIS S. 06/24/2022 1:30 PM Medical Record Number: [id number] Patient Account Number: [id number] Date of Birth/Sex: Treating RN: 1926-06-28 (88 y.o. Fredderick Phenix Primary Care Ulises Wolfinger: [name] Other Clinician: Referring Garnell Phenix: Treating George Haggart/Extender: [name] in Treatment: 0 Active Problems Location of Pain Severity and Description  of Pain Patient Has Paino Yes Site Locations Rate the pain. Current Pain Level: 8 Pain Management and Medication Current Pain Management: Electronic Signature(s) Signed: 06/24/2022 4:28:24 PM By: Samuella Bruin Entered By: Samuella Bruin on 06/24/2022 13:37:49 -------------------------------------------------------------------------------- Patient/Caregiver Education Details Patient Name: Date of Service: Katherine Clement, DO RIS S. 10/24/2023andnbsp1:30 PM Medical Record Number: [id number] Patient Account Number: [id number] Date of Birth/Gender: Treating RN: May 07, 1926 (88 y.o. Fredderick Phenix Primary Care Physician: [name] Other Clinician: Referring Physician: Treating Physician/Extender: [name] in Treatment: 0 Education Assessment Education Provided To: Caregiver Education Topics Provided Safety: Methods: Explain/Verbal Responses: Reinforcements needed, State content correctly DENINA, RIEGER ([id number]) [id number] Page 7 of 8 Electronic Signature(s) Signed: 06/24/2022 4:28:24 PM By: Samuella Bruin Entered By: Samuella Bruin on 06/24/2022 16:01:26 -------------------------------------------------------------------------------- Wound Assessment Details Patient Name: Date of Service: Katherine Clement, DO RIS S. 06/24/2022 1:30 PM Medical Record Number: [id number] Patient Account Number: [id number] Date of Birth/Sex: Treating RN: [birth year] (88 y.o. Fredderick Phenix Primary Care Merced Brougham: [name] Other Clinician: Referring Sheilyn Boehlke: Treating Errin Whitelaw/Extender: [name] in Treatment: 0 Wound Status Wound Number: 1 Primary Etiology: Pressure Ulcer Wound Location: Left Calcaneus Wound Status: Open Wounding Event: Pressure Injury Comorbid History: Arrhythmia, Dementia Date Acquired: 04/01/2022 Weeks Of Treatment: 0 Clustered Wound: No Wound  Measurements Length: (cm) 1.3 Width: (cm) 1.3 Depth: (cm) 0.1 Area: (cm) 1.327 Volume: (cm) 0.133 % Reduction in Area: % Reduction in Volume: Epithelialization: None Tunneling: No Undermining: No Wound Description Classification: Category/Stage III Wound Margin: Distinct, outline attached Exudate Amount: Medium Exudate Type: Serosanguineous Exudate Color: red, brown Foul  Odor After Cleansing: No Slough/Fibrino Yes Wound Bed Granulation Amount: Small (1-33%) Exposed Structure Granulation Quality: Red, Pink Fascia Exposed: No Necrotic Amount: Large (67-100%) Fat Layer (Subcutaneous Tissue) Exposed: Yes Necrotic Quality: Adherent Slough Tendon Exposed: No Muscle Exposed: No Joint Exposed: No Bone Exposed: No Periwound Skin Texture Texture Color No Abnormalities Noted: Yes No Abnormalities Noted: Yes Moisture Temperature / Pain No Abnormalities Noted: Yes Temperature: No Abnormality Treatment Notes Wound #1 (Calcaneus) Wound Laterality: Left Cleanser Soap and Water Discharge Instruction: May shower and wash wound with dial antibacterial soap and water prior to dressing change. Wound Cleanser Discharge Instruction: Cleanse the wound with wound cleanser prior to applying a clean dressing using gauze sponges, not tissue or cotton balls. Peri-Wound Care Topical SAMIKA, VETSCH ([id number]) [id number] Page 8 of 8 Primary Dressing KerraCel Ag Gelling Fiber Dressing, 2x2 in (silver alginate) Discharge Instruction: Apply silver alginate to wound bed as instructed Secondary Dressing ALLEVYN Heel 4 1/2in x 5 1/2in / 10.5cm x 13.5cm Discharge Instruction: Apply over primary dressing as directed. Woven Gauze Sponge, Non-Sterile 4x4 in Discharge Instruction: Apply over primary dressing as directed. Secured With [employer], 4.5x3.1 (in/yd) Discharge Instruction: Secure with Kerlix as directed. 58M Medipore H Soft Cloth Surgical T ape, 4 x  10 (in/yd) Discharge Instruction: Secure with tape as directed. Compression Wrap Compression Stockings Add-Ons Electronic Signature(s) Signed: 06/24/2022 4:28:24 PM By: Adline Peals Entered By: Adline Peals on 06/24/2022 13:43:39 -------------------------------------------------------------------------------- Vitals Details Patient Name: Date of Service: Dayton Martes, DO RIS S. 06/24/2022 1:30 PM Medical Record Number: [id number] Patient Account Number: [id number] Date of Birth/Sex: Treating RN: 1926/02/07 (88 y.o. F) Primary Care Rooney Swails: Jerrell Belfast Other Clinician: Referring Chad Donoghue: Treating Joline Encalada/Extender: Cipriano Bunker Weeks in Treatment: 0 Vital Signs Time Taken: 01:23 Temperature (F): 98.6 Height (in): 67 Pulse (bpm): 76 Respiratory Rate (breaths/min): 20 Blood Pressure (mmHg): 113/69 Reference Range: 80 - 120 mg / dl Electronic Signature(s) Signed: 06/24/2022 4:13:28 PM By: Worthy Rancher Entered By: Worthy Rancher on 06/24/2022 13:26:50

## 2022-06-25 DIAGNOSIS — L89622 Pressure ulcer of left heel, stage 2: Secondary | ICD-10-CM | POA: Diagnosis not present

## 2022-07-01 ENCOUNTER — Encounter (HOSPITAL_BASED_OUTPATIENT_CLINIC_OR_DEPARTMENT_OTHER): Payer: PPO | Admitting: General Surgery

## 2022-07-01 ENCOUNTER — Encounter: Payer: Self-pay | Admitting: Nurse Practitioner

## 2022-07-02 ENCOUNTER — Ambulatory Visit (INDEPENDENT_AMBULATORY_CARE_PROVIDER_SITE_OTHER): Payer: PPO | Admitting: Legal Medicine

## 2022-07-02 DIAGNOSIS — F05 Delirium due to known physiological condition: Secondary | ICD-10-CM | POA: Diagnosis not present

## 2022-07-02 DIAGNOSIS — F0284 Dementia in other diseases classified elsewhere, unspecified severity, with anxiety: Secondary | ICD-10-CM | POA: Diagnosis not present

## 2022-07-02 DIAGNOSIS — Z23 Encounter for immunization: Secondary | ICD-10-CM

## 2022-07-02 DIAGNOSIS — R5383 Other fatigue: Secondary | ICD-10-CM

## 2022-07-02 DIAGNOSIS — K219 Gastro-esophageal reflux disease without esophagitis: Secondary | ICD-10-CM | POA: Diagnosis not present

## 2022-07-02 DIAGNOSIS — N3001 Acute cystitis with hematuria: Secondary | ICD-10-CM

## 2022-07-02 DIAGNOSIS — F02811 Dementia in other diseases classified elsewhere, unspecified severity, with agitation: Secondary | ICD-10-CM | POA: Diagnosis not present

## 2022-07-02 DIAGNOSIS — F0283 Dementia in other diseases classified elsewhere, unspecified severity, with mood disturbance: Secondary | ICD-10-CM | POA: Diagnosis not present

## 2022-07-02 DIAGNOSIS — F02818 Dementia in other diseases classified elsewhere, unspecified severity, with other behavioral disturbance: Secondary | ICD-10-CM | POA: Diagnosis not present

## 2022-07-02 DIAGNOSIS — Z96642 Presence of left artificial hip joint: Secondary | ICD-10-CM | POA: Diagnosis not present

## 2022-07-02 DIAGNOSIS — Z9181 History of falling: Secondary | ICD-10-CM | POA: Diagnosis not present

## 2022-07-02 DIAGNOSIS — M80052D Age-related osteoporosis with current pathological fracture, left femur, subsequent encounter for fracture with routine healing: Secondary | ICD-10-CM | POA: Diagnosis not present

## 2022-07-02 DIAGNOSIS — D62 Acute posthemorrhagic anemia: Secondary | ICD-10-CM | POA: Diagnosis not present

## 2022-07-02 DIAGNOSIS — Z8673 Personal history of transient ischemic attack (TIA), and cerebral infarction without residual deficits: Secondary | ICD-10-CM | POA: Diagnosis not present

## 2022-07-02 DIAGNOSIS — H9190 Unspecified hearing loss, unspecified ear: Secondary | ICD-10-CM | POA: Diagnosis not present

## 2022-07-02 DIAGNOSIS — G309 Alzheimer's disease, unspecified: Secondary | ICD-10-CM | POA: Diagnosis not present

## 2022-07-02 DIAGNOSIS — F32A Depression, unspecified: Secondary | ICD-10-CM | POA: Diagnosis not present

## 2022-07-02 DIAGNOSIS — E43 Unspecified severe protein-calorie malnutrition: Secondary | ICD-10-CM | POA: Diagnosis not present

## 2022-07-02 DIAGNOSIS — I4891 Unspecified atrial fibrillation: Secondary | ICD-10-CM | POA: Diagnosis not present

## 2022-07-02 DIAGNOSIS — F411 Generalized anxiety disorder: Secondary | ICD-10-CM | POA: Diagnosis not present

## 2022-07-02 DIAGNOSIS — Z7901 Long term (current) use of anticoagulants: Secondary | ICD-10-CM | POA: Diagnosis not present

## 2022-07-02 DIAGNOSIS — I10 Essential (primary) hypertension: Secondary | ICD-10-CM | POA: Diagnosis not present

## 2022-07-02 DIAGNOSIS — D539 Nutritional anemia, unspecified: Secondary | ICD-10-CM | POA: Diagnosis not present

## 2022-07-02 LAB — POCT URINALYSIS DIP (CLINITEK)
Bilirubin, UA: NEGATIVE
Glucose, UA: NEGATIVE mg/dL
Nitrite, UA: NEGATIVE
Spec Grav, UA: >=1.03 — AB (ref 1.010–1.025)
Urobilinogen, UA: 0.2 E.U./dL
pH, UA: 5.5 (ref 5.0–8.0)

## 2022-07-02 MED ORDER — NITROFURANTOIN MONOHYD MACRO 100 MG PO CAPS: 100.0000 mg | ORAL_CAPSULE | Freq: Two times a day (BID) | ORAL | 0 refills | Status: DC

## 2022-07-02 NOTE — Progress Notes (Unsigned)
Subjective:  Patient ID: [name], female    DOB: 1925-11-28  Age: 88 y.o. MRN: [id number]  Chief Complaint  Patient presents with   Depression  HPI: patient is nursing home after hip fracture in august.  She was put in NH.  Walking with walker.  She adjusting well.  2to 3 weeks ago she started sleeping all day and not eating.  She has lost weight. Daughter worried.  We discussed with family. She is not drinking. Last labs in July were OK.  Depression        Associated symptoms include no fatigue, no myalgias and no headaches.    Current Outpatient Medications on File Prior to Visit  Medication Sig Dispense Refill   acebutolol (SECTRAL) 200 MG capsule Take 1 capsule (200 mg total) by mouth every other day. 45 capsule 3   atorvastatin (LIPITOR) 40 MG tablet TAKE 1 TABLET(40 MG) BY MOUTH DAILY 90 tablet 0   CALCIUM PO Take 250 mg by mouth daily.     Cholecalciferol (VITAMIN D3) 1000 units CAPS Take 1,000 Units by mouth daily.     divalproex (DEPAKOTE ER) 500 MG 24 hr tablet TAKE 1 TABLET(500 MG) BY MOUTH AT BEDTIME 90 tablet 1   divalproex (DEPAKOTE) 125 MG DR tablet TAKE 1 TABLET BY MOUTH EVERY MORNING 90 tablet 0   ELIQUIS 2.5 MG TABS tablet TAKE 1 TABLET(2.5 MG) BY MOUTH TWICE DAILY 180 tablet 1   midodrine (PROAMATINE) 5 MG tablet Take 1 tablet (5 mg total) by mouth 2 (two) times daily. 180 tablet 3   mirtazapine (REMERON) 30 MG tablet TAKE 1 TABLET(30 MG) BY MOUTH AT BEDTIME 90 tablet 1   pantoprazole (PROTONIX) 40 MG tablet Take 40 mg by mouth daily.     vitamin B-12 (CYANOCOBALAMIN) 500 MCG tablet Take 500 mcg by mouth daily.      No current facility-administered medications on file prior to visit.   Past Medical History:  Diagnosis Date   Acute ischemic stroke 11/08/2018   left postcentral gyri; left frontal   Adjustment disorder with depressed mood 10/16/2019   Alteration consciousness 03/08/2020   Apraxia following cerebrovascular accident 10/16/2019   B12  deficiency 10/16/2019   Bilateral impacted cerumen 04/02/2021   Body mass index (BMI) less than 19 02/16/2020   Chronic kidney disease, stage II (mild) 12/06/2019   Closed fracture of one rib of right side with routine healing 06/30/2017   Confusion    COPD (chronic obstructive pulmonary disease) 11/16/2017   Current use of long term anticoagulation 07/21/2017   Essential hypertension, benign 10/16/2019   First degree atrioventricular block 06/26/2017   Gait abnormality 09/24/2020   Hammer toe 06/26/2017   Hearing loss    Hyperlipidemia 06/26/2017   Laceration of right lower leg 06/18/2020   Major neurocognitive disorder due to Alzheimer's disease 08/16/2021   Malnutrition of moderate degree 12/06/2019   Memory loss    Other specified cardiac arrhythmias 06/26/2017   Pain in limb 06/26/2017   Palpitations 06/26/2017   Paroxysmal atrial fibrillation 06/26/2017   remote history post operatively, CHADS 2 score=2   Premature atrial complex 06/26/2017   Premature ventricular contractions 06/26/2017   PSVT (paroxysmal supraventricular tachycardia) 11/10/2017   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 10/16/2019   Thoracic aorta atherosclerosis 05/22/2020   Traumatic pneumothorax 06/30/2017   Past Surgical History:  Procedure Laterality Date   ABDOMINAL HYSTERECTOMY     SHOULDER SURGERY     TONSILLECTOMY      Family  History  Problem Relation Age of Onset   Bone cancer Mother    Breast cancer Father    Diabetes Father    Heart disease Father    Dementia Sister    Breast cancer Sister    Alzheimer's disease Sister    Dementia Brother    Social History   Socioeconomic History   Marital status: Widowed    Spouse name: Not on file   Number of children: 2   Years of education: 12   Highest education level: High school graduate  Occupational History   Occupation: Retired  Tobacco Use   Smoking status: Never    Passive exposure: Past   Smokeless tobacco: Never   Vaping Use   Vaping Use: Never used  Substance and Sexual Activity   Alcohol use: No   Drug use: No   Sexual activity: Not Currently  Other Topics Concern   Not on file  Social History Narrative   Mrs. Toki is widowed. Her daughter, [name], is her POA and lives 12 miles away. She resides in her own home, alone for last 7 years. Since her CVA she has had someone with her most of the time. She has a hired caregiver or a family member stays most of the day. She feel safe in her home and in her neighborhood.   Right-handed.   0.5 cups caffeine per day.   One story home   Social Determinants of Health   Financial Resource Strain: Low Risk  (01/10/2019)   Overall Financial Resource Strain (CARDIA)    Difficulty of Paying Living Expenses: Not hard at all  Food Insecurity: No Food Insecurity (01/10/2019)   Hunger Vital Sign    Worried About Running Out of Food in the Last Year: Never true    Ran Out of Food in the Last Year: Never true  Transportation Needs: No Transportation Needs (01/10/2019)   PRAPARE - Hydrologist (Medical): No    Lack of Transportation (Non-Medical): No  Physical Activity: Inactive (01/10/2019)   Exercise Vital Sign    Days of Exercise per Week: 0 days    Minutes of Exercise per Session: 0 min  Stress: No Stress Concern Present (01/10/2019)   Ray    Feeling of Stress : Not at all  Social Connections: Somewhat Isolated (01/10/2019)   Social Connection and Isolation Panel [NHANES]    Frequency of Communication with Friends and Family: More than three times a week    Frequency of Social Gatherings with Friends and Family: More than three times a week    Attends Religious Services: More than 4 times per year    Active Member of [employer] or Organizations: No    Attends Archivist Meetings: Never    Marital Status: Widowed    Review of Systems   Constitutional:  Negative for chills and fatigue.  HENT:  Negative for congestion, ear pain, nosebleeds, sinus pain and sore throat.   Eyes:  Negative for visual disturbance.  Respiratory:  Negative for cough and shortness of breath.   Cardiovascular:  Negative for chest pain and leg swelling.  Gastrointestinal:  Positive for constipation. Negative for abdominal pain, diarrhea, nausea and vomiting.  Genitourinary:  Positive for urgency. Negative for dysuria and frequency.  Musculoskeletal:  Negative for arthralgias, back pain and myalgias.  Neurological:  Negative for dizziness and headaches.  Psychiatric/Behavioral:  Positive for depression.  Objective:  BP 112/70   Pulse 76   Temp (!) 97.5 F (36.4 C)   Ht 5\' 2"  (1.575 m)   Wt 95 lb 6.4 oz (43.3 kg)   LMP  (LMP Unknown)   SpO2 97%   BMI 17.45 kg/m      07/02/2022    2:34 PM 03/27/2022   11:08 AM 02/27/2022   11:03 AM  BP/Weight  Systolic BP [id number]  Diastolic BP 70 68 80  Wt. (Lbs) 95.4 101 98  BMI 17.45 kg/m2 18.47 kg/m2 17.92 kg/m2    Physical Exam Vitals reviewed.  Constitutional:      Comments: Somnolent, non verbal  HENT:     Head: Normocephalic.     Right Ear: Tympanic membrane normal.     Left Ear: Tympanic membrane normal.  Cardiovascular:     Rate and Rhythm: Normal rate and regular rhythm.     Pulses: Normal pulses.     Heart sounds: Normal heart sounds. No murmur heard.    No gallop.  Pulmonary:     Effort: Pulmonary effort is normal. No respiratory distress.     Breath sounds: Normal breath sounds. No wheezing.  Abdominal:     General: Abdomen is flat. Bowel sounds are normal. There is no distension.     Palpations: Abdomen is soft.     Tenderness: There is no abdominal tenderness.  Skin:    General: Skin is warm.     Capillary Refill: Capillary refill takes less than 2 seconds.  Neurological:     Mental Status: She is disoriented.     Motor: Weakness present.     Comments: Needs  assistance to ambulate uses walker         Lab Results  Component Value Date   WBC 5.4 03/27/2022   HGB 11.5 03/27/2022   HCT 35.5 03/27/2022   PLT 197 03/27/2022   GLUCOSE 78 03/27/2022   CHOL 112 03/18/2021   TRIG 78 03/18/2021   HDL 46 03/18/2021   LDLCALC 50 03/18/2021   ALT 10 03/27/2022   AST 16 03/27/2022   NA 140 03/27/2022   K 4.8 03/27/2022   CL 101 03/27/2022   CREATININE 0.86 03/27/2022   BUN 19 03/27/2022   CO2 27 03/27/2022   TSH 1.460 05/22/2020   HGBA1C 5.3 11/09/2018      Assessment & Plan:   Problem List Items Addressed This Visit   None Visit Diagnoses     Need for immunization against influenza       Relevant Orders   Flu Vaccine QUAD High Dose(Fluad) (Completed)   Other fatigue       Relevant Orders   Comprehensive metabolic panel   CBC with Differential/Platelet   POCT URINALYSIS DIP (CLINITEK) (Completed)change in behavior. Fatigue and reversal of sleep times consistent to his DAT.  She had sudden   Acute cystitis with hematuria       Relevant Medications   nitrofurantoin, macrocrystal-monohydrate, (MACROBID) 100 MG capsule   Other Relevant Orders   Urine Culture Treat UTI with macrobid     .  Meds ordered this encounter  Medications   DISCONTD: nitrofurantoin, macrocrystal-monohydrate, (MACROBID) 100 MG capsule    Sig: Take 1 capsule (100 mg total) by mouth 2 (two) times daily.    Dispense:  20 capsule    Refill:  0   nitrofurantoin, macrocrystal-monohydrate, (MACROBID) 100 MG capsule    Sig: Take 1 capsule (100 mg total) by mouth 2 (two) times daily.  Dispense:  20 capsule    Refill:  0    Orders Placed This Encounter  Procedures   Urine Culture   Flu Vaccine QUAD High Dose(Fluad)   Comprehensive metabolic panel   CBC with Differential/Platelet   POCT URINALYSIS DIP (CLINITEK)     Follow-up: Return if symptoms worsen or fail to improve.  An After Visit Summary was printed and given to the patient.  Reinaldo Meeker, MD [facility] [phone]

## 2022-07-03 ENCOUNTER — Telehealth: Payer: Self-pay

## 2022-07-03 ENCOUNTER — Encounter: Payer: Self-pay | Admitting: Legal Medicine

## 2022-07-03 NOTE — Telephone Encounter (Signed)
     Patient  visit on 06/24/2022  at [facility]  Have you been able to follow up with your primary care physician? YES  The patient was or was not able to obtain any needed medicine or equipment. YES  Are there diet recommendations that you are having difficulty following? NA  Patient expresses understanding of discharge instructions and education provided has no other needs at this time. [place], [facility], Care Management  [phone] [address], [place], [place] [address] Phone: [phone] Email: [email or web address]

## 2022-07-04 ENCOUNTER — Encounter (HOSPITAL_BASED_OUTPATIENT_CLINIC_OR_DEPARTMENT_OTHER): Payer: PPO | Attending: General Surgery | Admitting: General Surgery

## 2022-07-04 DIAGNOSIS — Z7901 Long term (current) use of anticoagulants: Secondary | ICD-10-CM | POA: Insufficient documentation

## 2022-07-04 DIAGNOSIS — F02C4 Dementia in other diseases classified elsewhere, severe, with anxiety: Secondary | ICD-10-CM | POA: Insufficient documentation

## 2022-07-04 DIAGNOSIS — E440 Moderate protein-calorie malnutrition: Secondary | ICD-10-CM | POA: Diagnosis not present

## 2022-07-04 DIAGNOSIS — L89623 Pressure ulcer of left heel, stage 3: Secondary | ICD-10-CM | POA: Insufficient documentation

## 2022-07-04 DIAGNOSIS — J449 Chronic obstructive pulmonary disease, unspecified: Secondary | ICD-10-CM | POA: Diagnosis not present

## 2022-07-04 DIAGNOSIS — Z8673 Personal history of transient ischemic attack (TIA), and cerebral infarction without residual deficits: Secondary | ICD-10-CM | POA: Insufficient documentation

## 2022-07-04 DIAGNOSIS — I890 Lymphedema, not elsewhere classified: Secondary | ICD-10-CM | POA: Insufficient documentation

## 2022-07-04 DIAGNOSIS — I480 Paroxysmal atrial fibrillation: Secondary | ICD-10-CM | POA: Insufficient documentation

## 2022-07-04 LAB — URINE CULTURE

## 2022-07-05 NOTE — Progress Notes (Signed)
Urine culture < 10, 000 colonies, Is patient's behavior improving? lp

## 2022-07-05 NOTE — Progress Notes (Signed)
[name], [name] ([id number]) [id number] Page 1 of 8 Visit Report for 07/04/2022 Chief Complaint Document Details Patient Name: Date of Service: Katherine Clement, DO RIS S. 07/04/2022 3:30 PM Medical Record Number: [id number] Patient Account Number: [id number] Date of Birth/Sex: Treating RN: 03/31/1926 (88 y.o. [name] Primary Care Provider: Flonnie Singh Other Clinician: Referring Provider: Treating Provider/Extender: [name] in Treatment: 1 Information Obtained from: Patient Chief Complaint Patient is at the clinic for treatment of an open pressure ulcer Electronic Signature(s) Signed: 07/04/2022 4:02:26 PM By: Katherine Guess MD FACS Entered By: [name] on 07/04/2022 16:02:26 -------------------------------------------------------------------------------- Debridement Details Patient Name: Date of Service: Katherine Clement, DO RIS S. 07/04/2022 3:30 PM Medical Record Number: [id number] Patient Account Number: [id number] Date of Birth/Sex: Treating RN: 1925-10-14 (88 y.o. [name] Primary Care Provider: Flonnie Singh Other Clinician: Referring Provider: Treating Provider/Extender: [name] in Treatment: 1 Debridement Performed for Assessment: Wound #1 Left Calcaneus Performed By: Physician Katherine Guess, MD Debridement Type: Debridement Level of Consciousness (Pre-procedure): Awake and Alert Pre-procedure Verification/Time Out Yes - 15:40 Taken: Start Time: 15:43 Pain Control: Lidocaine 4% T opical Solution T Area Debrided (L x W): otal 1.1 (cm) x 1.3 (cm) = 1.43 (cm) Tissue and other material debrided: Non-Viable, Slough, Slough Level: Non-Viable Tissue Debridement Description: Selective/Open Wound Instrument: Curette Bleeding: Minimum Hemostasis Achieved: Silver Nitrate Procedural Pain: 0 Post Procedural Pain: 0 Response to Treatment: Procedure was tolerated  well Level of Consciousness (Post- Awake and Alert procedure): Post Debridement Measurements of Total Wound Length: (cm) 1.1 Stage: Category/Stage III Width: (cm) 1.3 Depth: (cm) 0.1 Volume: (cm) 0.112 Character of Wound/Ulcer Post Debridement: Improved Post Procedure Diagnosis Same as Pre-procedure [name], [name] ([id number]) [id number] Page 2 of 8 Notes scribed by [name] Electronic Signature(s) Signed: 07/04/2022 4:27:08 PM By: Katherine Guess MD FACS Signed: 07/04/2022 5:27:34 PM By: Katherine Deed RN, BSN Entered By: [name] on 07/04/2022 15:50:07 -------------------------------------------------------------------------------- HPI Details Patient Name: Date of Service: Katherine Clement, DO RIS S. 07/04/2022 3:30 PM Medical Record Number: [id number] Patient Account Number: [id number] Date of Birth/Sex: Treating RN: 22-Nov-1925 (88 y.o. [name] Primary Care Provider: Flonnie Singh Other Clinician: Referring Provider: Treating Provider/Extender: [name] in Treatment: 1 History of Present Illness HPI Description: ADMISSION 06/24/2022 This is a 88 year old woman with fairly advanced dementia, history of stroke, atrial fibrillation, hypertension, chronic anticoagulation, and COPD. She resides in a facility. She is, unfortunately, not accompanied by anyone familiar with her medical history today and so what we are able to obtain is limited to the electronic medical record. She is not diabetic. For an unknown period of time, she has had an ulcer on her left heel. She denies pain. There was a dressing on the wound on intake, but it seems to have been there for quite some time as there was a significant odor present. Once her foot was washed, the odor dissipated. ABI in clinic was normal at 1.04. There is an oval ulceration on her posterior left calcaneus, there is some granulation tissue present which is  a bit friable as well as slough and nonviable subcutaneous tissue. Periwound skin is intact and there is no significant edema. No malodor or purulent drainage. 07/04/2022: The wound is smaller today. There is hypertrophic granulation tissue present as well as a little bit of slough. No concern for infection. Apparently she did get a Prevalon boot, but she takes it off and will not wear it. Electronic Signature(s)  Signed: 07/04/2022 4:03:12 PM By: Katherine Guess MD FACS Entered By: [name] on 07/04/2022 16:03:11 -------------------------------------------------------------------------------- Physical Exam Details Patient Name: Date of Service: Katherine Clement, DO RIS S. 07/04/2022 3:30 PM Medical Record Number: [id number] Patient Account Number: [id number] Date of Birth/Sex: Treating RN: 1925/10/25 (88 y.o. [name] Primary Care Provider: Flonnie Singh Other Clinician: Referring Provider: Treating Provider/Extender: [name] in Treatment: 1 Constitutional . . . . No acute distress.[name] Respiratory Normal work of breathing on room air.. Notes 07/04/2022: The wound is smaller today. There is hypertrophic granulation tissue present as well as a little bit of slough. No concern for infection. Electronic Signature(s) Signed: 07/04/2022 4:04:57 PM By: Katherine Guess MD FACS [name] ([id number]) PM By: Katherine Guess MD FACS [phone].pdf Page 3 of 8 Signed: 07/04/2022 4:04:57 Entered By: [name] on 07/04/2022 16:04:56 -------------------------------------------------------------------------------- Physician Orders Details Patient Name: Date of Service: Katherine Clement, DO RIS S. 07/04/2022 3:30 PM Medical Record Number: [id number] Patient Account Number: [id number] Date of Birth/Sex: Treating RN: 02/17/1926 (88 y.o. [name] Primary Care Provider: Flonnie Singh Other Clinician: Referring  Provider: Treating Provider/Extender: [name] in Treatment: 1 Verbal / Phone Orders: No Diagnosis Coding ICD-10 Coding Code Description [phone] Pressure ulcer of left heel, stage 3 J44.9 Chronic obstructive pulmonary disease, unspecified G30.9 Alzheimer's disease, unspecified Dementia in other diseases classified elsewhere, unspecified severity, without behavioral disturbance, psychotic disturbance, mood F02.80 disturbance, and anxiety E44.0 Moderate protein-calorie malnutrition R29.6 Repeated falls Z79.01 Long term (current) use of anticoagulants I48.0 Paroxysmal atrial fibrillation I95.9 Hypotension, unspecified Follow-up Appointments ppointment in 1 week. - Dr. Lady Gary - room 2 Return A Anesthetic (In clinic) Topical Lidocaine 4% applied to wound bed Bathing/ Shower/ Hygiene May shower and wash wound with soap and water. - with dressing changes Edema Control - Lymphedema / SCD / Other Avoid standing for long periods of time. Off-Loading Other: - stay off of foot as much as possible float heels (no pressure to left heels) prevalon boot to left foot Home Health New wound care orders this week; continue Home Health for wound care. May utilize formulary equivalent dressing for wound treatment orders unless otherwise specified. - change to hydrofera blue Other Home Health Orders/Instructions: - [facility] Wound Treatment Wound #1 - Calcaneus Wound Laterality: Left Cleanser: Soap and Water Every Other Day/30 Days Discharge Instructions: May shower and wash wound with dial antibacterial soap and water prior to dressing change. Cleanser: Wound Cleanser Every Other Day/30 Days Discharge Instructions: Cleanse the wound with wound cleanser prior to applying a clean dressing using gauze sponges, not tissue or cotton balls. Prim Dressing: Hydrofera Blue Ready Foam, 2.5 x2.5 in Every Other Day/30 Days ary Discharge Instructions: Apply to wound bed as  instructed Secondary Dressing: ALLEVYN Heel 4 1/2in x 5 1/2in / 10.5cm x 13.5cm Every Other Day/30 Days Discharge Instructions: Apply over primary dressing as directed. Secondary Dressing: Woven Gauze Sponge, Non-Sterile 4x4 in Every Other Day/30 Days Discharge Instructions: Apply over primary dressing as directed. [name], [name] ([id number]) [id number] Page 4 of 8 Secured With: [employer], 4.5x3.1 (in/yd) Every Other Day/30 Days Discharge Instructions: Secure with Kerlix as directed. Secured With: 71M Medipore H Soft Cloth Surgical T ape, 4 x 10 (in/yd) Every Other Day/30 Days Discharge Instructions: Secure with tape as directed. Patient Medications llergies: codeine, penicillin, Sulfa (Sulfonamide Antibiotics) A Notifications Medication Indication Start End prior to debridement 07/04/2022 lidocaine DOSE topical 4 % cream - cream topical Electronic Signature(s) Signed:  07/04/2022 4:27:08 PM By: Fredirick Maudlin MD FACS Entered By: Fredirick Maudlin on 07/04/2022 16:05:48 -------------------------------------------------------------------------------- Problem List Details Patient Name: Date of Service: Katherine Martes, DO RIS S. 07/04/2022 3:30 PM Medical Record Number: [id number] Patient Account Number: [id number] Date of Birth/Sex: Treating RN: April 24, 1926 (88 y.o. Elam Dutch Primary Care Provider: Jerrell Belfast Other Clinician: Referring Provider: Treating Provider/Extender: Cipriano Bunker Singh in Treatment: 1 Active Problems ICD-10 Encounter Code Description Active Date MDM Diagnosis [phone] Pressure ulcer of left heel, stage 3 06/24/2022 No Yes J44.9 Chronic obstructive pulmonary disease, unspecified 06/24/2022 No Yes G30.9 Alzheimer's disease, unspecified 06/24/2022 No Yes F02.80 Dementia in other diseases classified elsewhere, unspecified severity, without 06/24/2022 No Yes behavioral disturbance, psychotic  disturbance, mood disturbance, and anxiety E44.0 Moderate protein-calorie malnutrition 06/24/2022 No Yes R29.6 Repeated falls 06/24/2022 No Yes Z79.01 Long term (current) use of anticoagulants 06/24/2022 No Yes I48.0 Paroxysmal atrial fibrillation 06/24/2022 No Yes JAYMEE, TILSON ([id number]) [id number] Page 5 of 8 I95.9 Hypotension, unspecified 06/24/2022 No Yes Inactive Problems Resolved Problems Electronic Signature(s) Signed: 07/04/2022 4:02:12 PM By: Fredirick Maudlin MD FACS Entered By: Fredirick Maudlin on 07/04/2022 16:02:12 -------------------------------------------------------------------------------- Progress Note Details Patient Name: Date of Service: Katherine Martes, DO RIS S. 07/04/2022 3:30 PM Medical Record Number: [id number] Patient Account Number: [id number] Date of Birth/Sex: Treating RN: 1926-02-22 (88 y.o. Elam Dutch Primary Care Provider: Jerrell Belfast Other Clinician: Referring Provider: Treating Provider/Extender: Robyne Peers in Treatment: 1 Subjective Chief Complaint Information obtained from Patient Patient is at the clinic for treatment of an open pressure ulcer History of Present Illness (HPI) ADMISSION 06/24/2022 This is a 88 year old woman with fairly advanced dementia, history of stroke, atrial fibrillation, hypertension, chronic anticoagulation, and COPD. She resides in a facility. She is, unfortunately, not accompanied by anyone familiar with her medical history today and so what we are able to obtain is limited to the electronic medical record. She is not diabetic. For an unknown period of time, she has had an ulcer on her left heel. She denies pain. There was a dressing on the wound on intake, but it seems to have been there for quite some time as there was a significant odor present. Once her foot was washed, the odor dissipated. ABI in clinic was normal at 1.04. There is an oval ulceration  on her posterior left calcaneus, there is some granulation tissue present which is a bit friable as well as slough and nonviable subcutaneous tissue. Periwound skin is intact and there is no significant edema. No malodor or purulent drainage. 07/04/2022: The wound is smaller today. There is hypertrophic granulation tissue present as well as a little bit of slough. No concern for infection. Apparently she did get a Prevalon boot, but she takes it off and will not wear it. Patient History Unable to Obtain Patient History due to Dementia. Medical History Cardiovascular Patient has history of Arrhythmia - a-fib Neurologic Patient has history of Dementia Medical A Surgical History Notes nd Ear/Nose/Mouth/Throat dyspahgia Objective Constitutional No acute distress.[name], [name] ([id number]) [id number] Page 6 of 8 Vitals Time Taken: 3:23 PM, Height: 67 in, Temperature: 98.3 F, Pulse: 83 bpm, Respiratory Rate: 18 breaths/min, Blood Pressure: 124/58 mmHg. Respiratory Normal work of breathing on room air.. General Notes: 07/04/2022: The wound is smaller today. There is hypertrophic granulation tissue present as well as a little bit of slough. No concern for infection. Integumentary (Hair, Skin) Wound #1 status is Open. Original cause of wound was Pressure Injury. The date  acquired was: 04/01/2022. The wound has been in treatment 1 Singh. The wound is located on the Left Calcaneus. The wound measures 1.1cm length x 1.3cm width x 0.1cm depth; 1.123cm^2 area and 0.112cm^3 volume. There is Fat Layer (Subcutaneous Tissue) exposed. There is no tunneling or undermining noted. There is a medium amount of serosanguineous drainage noted. The wound margin is distinct with the outline attached to the wound base. There is large (67-100%) red, pink, hyper - granulation within the wound bed. There is a small (1- 33%) amount of necrotic tissue within the wound bed including Adherent  Slough. The periwound skin appearance had no abnormalities noted for texture. The periwound skin appearance had no abnormalities noted for moisture. The periwound skin appearance had no abnormalities noted for color. Periwound temperature was noted as No Abnormality. Assessment Active Problems ICD-10 Pressure ulcer of left heel, stage 3 Chronic obstructive pulmonary disease, unspecified Alzheimer's disease, unspecified Dementia in other diseases classified elsewhere, unspecified severity, without behavioral disturbance, psychotic disturbance, mood disturbance, and anxiety Moderate protein-calorie malnutrition Repeated falls Long term (current) use of anticoagulants Paroxysmal atrial fibrillation Hypotension, unspecified Procedures Wound #1 Pre-procedure diagnosis of Wound #1 is a Pressure Ulcer located on the Left Calcaneus . There was a Selective/Open Wound Non-Viable Tissue Debridement with a total area of 1.43 sq cm performed by Katherine Guessannon, Miyu Fenderson, MD. With the following instrument(s): Curette to remove Non-Viable tissue/material. Material [facility] includes [facility] after achieving pain control using Lidocaine 4% T opical Solution. No specimens were taken. A time out was conducted at 15:40, prior to the start of the procedure. A Minimum amount of bleeding was controlled with Silver Nitrate. The procedure was tolerated well with a pain level of 0 throughout and a pain level of 0 following the procedure. Post Debridement Measurements: 1.1cm length x 1.3cm width x 0.1cm depth; 0.112cm^3 volume. Post debridement Stage noted as Category/Stage III. Character of Wound/Ulcer Post Debridement is improved. Post procedure Diagnosis Wound #1: Same as Pre-Procedure General Notes: scribed by Katherine DeedLinda Boehlein Plan Follow-up Appointments: Return Appointment in 1 week. - Dr. Lady Garyannon - room 2 Anesthetic: (In clinic) Topical Lidocaine 4% applied to wound bed Bathing/ Shower/ Hygiene: May shower and wash  wound with soap and water. - with dressing changes Edema Control - Lymphedema / SCD / Other: Avoid standing for long periods of time. Off-Loading: Other: - stay off of foot as much as possible float heels (no pressure to left heels) prevalon boot to left foot Home Health: New wound care orders this week; continue Home Health for wound care. May utilize formulary equivalent dressing for wound treatment orders unless otherwise specified. - change to hydrofera blue Other Home Health Orders/Instructions: [facility] The following medication(s) was prescribed: lidocaine topical 4 % cream cream topical for prior to debridement was prescribed at facility WOUND #1: - Calcaneus Wound Laterality: Left Cleanser: Soap and Water Every Other Day/30 Days Discharge Instructions: May shower and wash wound with dial antibacterial soap and water prior to dressing change. Cleanser: Wound Cleanser Every Other Day/30 Days Discharge Instructions: Cleanse the wound with wound cleanser prior to applying a clean dressing using gauze sponges, not tissue or cotton balls. Prim Dressing: Hydrofera Blue Ready Foam, 2.5 x2.5 in Every Other Day/30 Days ary Discharge Instructions: Apply to wound bed as instructed Secondary Dressing: ALLEVYN Heel 4 1/2in x 5 1/2in / 10.5cm x 13.5cm Every Other Day/30 Days Discharge Instructions: Apply over primary dressing as directed. Secondary Dressing: Woven Gauze Sponge, Non-Sterile 4x4 in Every Other Day/30 Days Discharge  Instructions: Apply over primary dressing as directed. Secured With: [employer], 4.5x3.1 (in/yd) Every Other Day/30 Days Discharge Instructions: Secure with Kerlix as directed. [name], [name] ([id number]) [id number] Page 7 of 8 Secured With: 72M Medipore H Soft Cloth Surgical T ape, 4 x 10 (in/yd) Every Other Day/30 Days Discharge Instructions: Secure with tape as directed. 07/04/2022: The wound is smaller today. There is hypertrophic  granulation tissue present as well as a little bit of slough. No concern for infection. I used a curette to debride the slough from the wound. I then used silver nitrate to chemically cauterize the hypertrophic granulation tissue. Going to change her dressing to Hydrofera Blue to try and suppress further emergence of hypertrophic granulation. Continue to make efforts to offload is much as possible. Follow-up in 1 week. Electronic Signature(s) Signed: 07/04/2022 4:06:40 PM By: Katherine Guess MD FACS Entered By: [name] on 07/04/2022 16:06:39 -------------------------------------------------------------------------------- HxROS Details Patient Name: Date of Service: Katherine Clement, DO RIS S. 07/04/2022 3:30 PM Medical Record Number: [id number] Patient Account Number: [id number] Date of Birth/Sex: Treating RN: October 31, 1925 (88 y.o. [name] Primary Care Provider: Flonnie Singh Other Clinician: Referring Provider: Treating Provider/Extender: [name] in Treatment: 1 Unable to Obtain Patient History due to Dementia Ear/Nose/Mouth/Throat Medical History: Past Medical History Notes: dyspahgia Cardiovascular Medical History: Positive for: Arrhythmia - a-fib Neurologic Medical History: Positive for: Dementia Immunizations Pneumococcal Vaccine: Received Pneumococcal Vaccination: No Implantable Devices No devices added Electronic Signature(s) Signed: 07/04/2022 4:27:08 PM By: Katherine Guess MD FACS Signed: 07/04/2022 5:27:34 PM By: Katherine Deed RN, BSN Entered By: [name] on 07/04/2022 16:04:36 SuperBill Details -------------------------------------------------------------------------------- [name] ([id number]) [id number] Page 8 of 8 Patient Name: Date of Service: [name] Yolanda Manges, DO RIS S. 07/04/2022 Medical Record Number: [id number] Patient Account Number: [id number] Date of Birth/Sex: Treating  RN: [birth year] (88 y.o. [name] Primary Care Provider: Flonnie Singh Other Clinician: Referring Provider: Treating Provider/Extender: [name] in Treatment: 1 Diagnosis Coding ICD-10 Codes Code Description [id number] Pressure ulcer of left heel, stage 3 J44.9 Chronic obstructive pulmonary disease, unspecified G30.9 Alzheimer's disease, unspecified Dementia in other diseases classified elsewhere, unspecified severity, without behavioral disturbance, psychotic disturbance, mood F02.80 disturbance, and anxiety E44.0 Moderate protein-calorie malnutrition R29.6 Repeated falls Z79.01 Long term (current) use of anticoagulants I48.0 Paroxysmal atrial fibrillation I95.9 Hypotension, unspecified Facility Procedures CPT4 Code Description Modifier Quantity [id number] [phone] - DEBRIDE WOUND 1ST 20 SQ CM OR < 1 ICD-10 Diagnosis Description L89.623 Pressure ulcer of left heel, stage 3 Physician Procedures Quantity CPT4 Code Description Modifier [id number] - WC PHYS LEVEL 3 - EST PT 25 1 ICD-10 Diagnosis Description L89.623 Pressure ulcer of left heel, stage 3 E44.0 Moderate protein-calorie malnutrition G30.9 Alzheimer's disease, unspecified J44.9 Chronic obstructive pulmonary disease, unspecified [id number] - WC PHYS DEBR WO ANESTH 20 SQ CM 1 ICD-10 Diagnosis Description L89.623 Pressure ulcer of left heel, stage 3 Electronic Signature(s) Signed: 07/04/2022 4:07:06 PM By: Katherine Guess MD FACS Entered By: [name] on 07/04/2022 16:07:06

## 2022-07-07 NOTE — Progress Notes (Signed)
Katherine, Singh ([id number]) [id number] Page 1 of 7 Visit Report for 07/04/2022 Arrival Information Details Patient Name: Date of Service: Katherine Clement, DO RIS S. 07/04/2022 3:30 PM Medical Record Number: [id number] Patient Account Number: [id number] Date of Birth/Sex: Treating RN: April 27, 1926 (88 y.o. [name] Primary Care [name]: [name] Other Clinician: Referring [name]: Treating [name]/Extender: [name] in Treatment: 1 Visit Information History Since Last Visit Added or deleted any medications: Yes Patient Arrived: Wheel Chair Any new allergies or adverse reactions: No Arrival Time: 15:17 Had a fall or experienced change in Yes Accompanied By: daughter activities of daily living that may affect Transfer Assistance: Manual risk of falls: Patient Identification Verified: Yes Signs or symptoms of abuse/neglect since No Secondary Verification Process Completed: Yes last visito Patient Requires Transmission-Based Precautions: No Hospitalized since last visit: No Patient Has Alerts: No Implantable device outside of the clinic No excluding cellular tissue based products placed in the center since last visit: Has Dressing in Place as Prescribed: Yes Has Footwear/Offloading in Place as Yes Prescribed: Left: Surgical Shoe with Pressure Relief Insole Pain Present Now: No Electronic Signature(s) Signed: 07/04/2022 5:27:34 PM By: [name] RN, BSN Entered By: [name] on 07/04/2022 15:23:20 -------------------------------------------------------------------------------- Encounter Discharge Information Details Patient Name: Date of Service: Katherine Clement, DO RIS S. 07/04/2022 3:30 PM Medical Record Number: [id number] Patient Account Number: [id number] Date of Birth/Sex: Treating RN: [birth year] (88 y.o. [name] Primary Care [name]: [name] Other Clinician: Referring  [name]: Treating [name]/Extender: [name] in Treatment: 1 Encounter Discharge Information Items Post Procedure Vitals Discharge Condition: Stable Temperature (F): 98.3 Ambulatory Status: Wheelchair Pulse (bpm): 83 Discharge Destination: Home Respiratory Rate (breaths/min): 18 Transportation: Private Auto Blood Pressure (mmHg): 124/58 Accompanied By: daughter Schedule Follow-up Appointment: Yes Clinical Summary of Care: Patient Declined Electronic Signature(s) Signed: 07/04/2022 5:27:34 PM By: [name] RN, BSN Entered By: [name] on 07/04/2022 16:03:29 [name] ([id number]) [id number] Page 2 of 7 -------------------------------------------------------------------------------- Lower Extremity Assessment Details Patient Name: Date of Service: Katherine Clement, DO RIS S. 07/04/2022 3:30 PM Medical Record Number: [id number] Patient Account Number: [id number] Date of Birth/Sex: Treating RN: [birth year] (88 y.o. [name] Primary Care Tanith Dagostino: [name] Other Clinician: Referring [name]: Treating [name]/Extender: [name] in Treatment: 1 Edema Assessment Assessed: [Left: No] [Right: No] Edema: [Left: N] [Right: o] Calf Left: Right: Point of Measurement: From Medial Instep 28.5 cm Ankle Left: Right: Point of Measurement: From Medial Instep 18.5 cm Vascular Assessment Pulses: Dorsalis Pedis Palpable: [Left:Yes] Electronic Signature(s) Signed: 07/04/2022 5:27:34 PM By: [name] RN, BSN Entered By: [name] on 07/04/2022 15:30:48 -------------------------------------------------------------------------------- Multi Wound Chart Details Patient Name: Date of Service: Katherine Clement, DO RIS S. 07/04/2022 3:30 PM Medical Record Number: [id number] Patient Account Number: [id number] Date of Birth/Sex: Treating RN: 1926-01-21 (88 y.o. [name] Primary Care [name]: [name] Other Clinician: Referring [name]: Treating [name]/Extender: [name] in Treatment: 1 Vital Signs Height(in): 67 Pulse(bpm): 83 Weight(lbs): Blood Pressure(mmHg): 124/58 Body Mass Index(BMI): Temperature(F): 98.3 Respiratory Rate(breaths/min): 18 [1:Photos:] [N/A:N/A] Left Calcaneus N/A N/A Wound Location: Pressure Injury N/A N/A Wounding Event: Pressure Ulcer N/A N/A Primary Etiology: Arrhythmia, Dementia N/A N/A Comorbid History: 04/01/2022 N/A N/A Date Acquired: 1 N/A N/A Singh of Treatment: Open N/A N/A Wound Status: No N/A N/A Wound Recurrence: 1.1x1.3x0.1 N/A N/A Measurements L x W x D (cm) 1.123 N/A N/A A (cm) : rea 0.112 N/A N/A Volume (cm) :  15.40% N/A N/A % Reduction in A rea: 15.80% N/A N/A % Reduction in Volume: Category/Stage III N/A N/A Classification: Medium N/A N/A Exudate A mount: Serosanguineous N/A N/A Exudate Type: red, brown N/A N/A Exudate Color: Distinct, outline attached N/A N/A Wound Margin: Large (67-100%) N/A N/A Granulation A mount: Red, Pink, Hyper-granulation N/A N/A Granulation Quality: Small (1-33%) N/A N/A Necrotic A mount: Fat Layer (Subcutaneous Tissue): Yes N/A N/A Exposed Structures: Fascia: No Tendon: No Muscle: No Joint: No Bone: No Small (1-33%) N/A N/A Epithelialization: Debridement - Selective/Open Wound N/A N/A Debridement: Pre-procedure Verification/Time Out 15:40 N/A N/A Taken: Lidocaine 4% T opical Solution N/A N/A Pain Control: Slough N/A N/A Tissue Debrided: Non-Viable Tissue N/A N/A Level: 1.43 N/A N/A Debridement A (sq cm): rea Curette N/A N/A Instrument: Minimum N/A N/A Bleeding: Silver Nitrate N/A N/A Hemostasis A chieved: 0 N/A N/A Procedural Pain: 0 N/A N/A Post Procedural Pain: Procedure was tolerated well N/A N/A Debridement Treatment Response: 1.1x1.3x0.1 N/A N/A Post Debridement  Measurements L x W x D (cm) 0.112 N/A N/A Post Debridement Volume: (cm) Category/Stage III N/A N/A Post Debridement Stage: No Abnormalities Noted N/A N/A Periwound Skin Texture: No Abnormalities Noted N/A N/A Periwound Skin Moisture: No Abnormalities Noted N/A N/A Periwound Skin Color: No Abnormality N/A N/A Temperature: Debridement N/A N/A Procedures Performed: Treatment Notes Electronic Signature(s) Signed: 07/04/2022 4:02:21 PM By: Duanne Guess MD FACS Signed: 07/04/2022 5:27:34 PM By: [name] RN, BSN Entered By: Duanne Guess on 07/04/2022 16:02:21 -------------------------------------------------------------------------------- Multi-Disciplinary Care Plan Details Patient Name: Date of Service: Katherine Clement, DO RIS S. 07/04/2022 3:30 PM Medical Record Number: [id number] Patient Account Number: [id number] Date of Birth/Sex: Treating RN: Sep 13, 1925 (88 y.o. [name] Primary Care Juda Toepfer: [name] Other Clinician: Referring [name]: Treating [name]/Extender: [name] in Treatment: [address] Katherine, Singh ([id number]) [id number] Page 4 of 7 Pressure Nursing Diagnoses: Knowledge deficit related to causes and risk factors for pressure ulcer development Knowledge deficit related to management of pressures ulcers Goals: Patient will remain free from development of additional pressure ulcers Date Initiated: 06/24/2022 Target Resolution Date: 08/08/2022 Goal Status: Active Patient/caregiver will verbalize risk factors for pressure ulcer development Date Initiated: 06/24/2022 Target Resolution Date: 08/08/2022 Goal Status: Active Interventions: Assess: immobility, friction, shearing, incontinence upon admission and as needed Treatment Activities: Pressure reduction/relief device ordered : 06/24/2022 Notes: Wound/Skin Impairment Nursing Diagnoses: Impaired tissue  integrity Knowledge deficit related to ulceration/compromised skin integrity Goals: Patient/caregiver will verbalize understanding of skin care regimen Date Initiated: 06/24/2022 Target Resolution Date: 08/08/2022 Goal Status: Active Interventions: Assess ulceration(s) every visit Treatment Activities: Skin care regimen initiated : 06/24/2022 Topical wound management initiated : 06/24/2022 Notes: Electronic Signature(s) Signed: 07/04/2022 5:27:34 PM By: [name] RN, BSN Entered By: [name] on 07/04/2022 15:41:06 -------------------------------------------------------------------------------- Pain Assessment Details Patient Name: Date of Service: Katherine Clement, DO RIS S. 07/04/2022 3:30 PM Medical Record Number: [id number] Patient Account Number: [id number] Date of Birth/Sex: Treating RN: 01/19/1926 (88 y.o. [name] Primary Care Layn Kye: [name] Other Clinician: Referring [name]: Treating Brielynn Sekula/Extender: [name] in Treatment: 1 Active Problems Location of Pain Severity and Description of Pain Patient Has Paino No Site Locations Rate the pain. Katherine, Singh ([id number]) [id number] Page 5 of 7 Rate the pain. Current Pain Level: 0 Pain Management and Medication Current Pain Management: Electronic Signature(s) Signed: 07/04/2022 5:27:34 PM By: [name] RN, BSN Entered By: [name] on 07/04/2022 15:25:25 -------------------------------------------------------------------------------- Patient/Caregiver Education Details Patient Name: Date of  Service: Katherine Clement, DO RIS S. 11/3/2023andnbsp3:30 PM Medical Record Number: [id number] Patient Account Number: [id number] Date of Birth/Gender: Treating RN: 1925-12-27 (88 y.o. [name] Primary Care Physician: [name] Other Clinician: Referring Physician: Treating Physician/Extender: [name] in Treatment: 1 Education Assessment Education Provided To: Patient Education Topics Provided Pressure: Methods: Explain/Verbal Responses: Reinforcements needed, State content correctly Wound/Skin Impairment: Methods: Explain/Verbal Responses: Reinforcements needed, State content correctly Electronic Signature(s) Signed: 07/04/2022 5:27:34 PM By: [name] RN, BSN Entered By: [name] on 07/04/2022 15:41:29 -------------------------------------------------------------------------------- Wound Assessment Details Patient Name: Date of Service: Katherine Clement, DO RIS S. 07/04/2022 3:30 PM [name] ([id number]) [id number] Page 6 of 7 Medical Record Number: [id number] Patient Account Number: [id number] Date of Birth/Sex: Treating RN: 03/15/1926 (88 y.o. [name] Primary Care [name]: [name] Other Clinician: Referring [name]: Treating Jameela Michna/Extender: [name] in Treatment: 1 Wound Status Wound Number: 1 Primary Etiology: Pressure Ulcer Wound Location: Left Calcaneus Wound Status: Open Wounding Event: Pressure Injury Comorbid History: Arrhythmia, Dementia Date Acquired: 04/01/2022 Singh Of Treatment: 1 Clustered Wound: No Photos Wound Measurements Length: (cm) 1.1 Width: (cm) 1.3 Depth: (cm) 0.1 Area: (cm) 1.123 Volume: (cm) 0.112 % Reduction in Area: 15.4% % Reduction in Volume: 15.8% Epithelialization: Small (1-33%) Tunneling: No Undermining: No Wound Description Classification: Category/Stage III Wound Margin: Distinct, outline attached Exudate Amount: Medium Exudate Type: Serosanguineous Exudate Color: red, brown Foul Odor After Cleansing: No Slough/Fibrino Yes Wound Bed Granulation Amount: Large (67-100%) Exposed Structure Granulation Quality: Red, Pink, Hyper-granulation Fascia Exposed: No Necrotic Amount: Small (1-33%) Fat Layer (Subcutaneous Tissue)  Exposed: Yes Necrotic Quality: Adherent Slough Tendon Exposed: No Muscle Exposed: No Joint Exposed: No Bone Exposed: No Periwound Skin Texture Texture Color No Abnormalities Noted: Yes No Abnormalities Noted: Yes Moisture Temperature / Pain No Abnormalities Noted: Yes Temperature: No Abnormality Treatment Notes Wound #1 (Calcaneus) Wound Laterality: Left Cleanser Soap and Water Discharge Instruction: May shower and wash wound with dial antibacterial soap and water prior to dressing change. Wound Cleanser Discharge Instruction: Cleanse the wound with wound cleanser prior to applying a clean dressing using gauze sponges, not tissue or cotton balls. Peri-Wound Care Topical Primary Dressing Hydrofera Blue Ready Foam, 2.5 x2.5 in [place] ([id number]) [id number] Page 7 of 7 Discharge Instruction: Apply to wound bed as instructed Secondary Dressing ALLEVYN Heel 4 1/2in x 5 1/2in / 10.5cm x 13.5cm Discharge Instruction: Apply over primary dressing as directed. Woven Gauze Sponge, Non-Sterile 4x4 in Discharge Instruction: Apply over primary dressing as directed. Secured With [employer], 4.5x3.1 (in/yd) Discharge Instruction: Secure with Kerlix as directed. 60M Medipore H Soft Cloth Surgical T ape, 4 x 10 (in/yd) Discharge Instruction: Secure with tape as directed. Compression Wrap Compression Stockings Add-Ons Electronic Signature(s) Signed: 07/04/2022 5:27:34 PM By: [name] RN, BSN Signed: 07/07/2022 4:30:42 PM By: Gelene Mink By: Samuella Bruin on 07/04/2022 15:36:32 -------------------------------------------------------------------------------- Vitals Details Patient Name: Date of Service: Katherine Clement, DO RIS S. 07/04/2022 3:30 PM Medical Record Number: [id number] Patient Account Number: [id number] Date of Birth/Sex: Treating RN: 1925/12/10 (88 y.o. [name], [name] Primary Care Fannie Alomar: [name]  Other Clinician: Referring [name]: Treating Ulmer Degen/Extender: [name] in Treatment: 1 Vital Signs Time Taken: 15:23 Temperature (F): 98.3 Height (in): 67 Pulse (bpm): 83 Respiratory Rate (breaths/min): 18 Blood Pressure (mmHg): 124/58 Reference Range: 80 - 120 mg / dl Electronic Signature(s) Signed: 07/04/2022 5:27:34 PM By: [name] RN, BSN Entered By: [name]  on 07/04/2022 15:26:27

## 2022-07-11 ENCOUNTER — Encounter (HOSPITAL_BASED_OUTPATIENT_CLINIC_OR_DEPARTMENT_OTHER): Payer: PPO | Admitting: General Surgery

## 2022-07-11 DIAGNOSIS — L89622 Pressure ulcer of left heel, stage 2: Secondary | ICD-10-CM | POA: Diagnosis not present

## 2022-07-15 ENCOUNTER — Encounter (HOSPITAL_BASED_OUTPATIENT_CLINIC_OR_DEPARTMENT_OTHER): Payer: PPO | Admitting: General Surgery

## 2022-07-15 DIAGNOSIS — L89623 Pressure ulcer of left heel, stage 3: Secondary | ICD-10-CM | POA: Diagnosis not present

## 2022-07-15 NOTE — Progress Notes (Signed)
[name] ([id number]) [id number] Page 1 of 7 Visit Report for 07/15/2022 Arrival Information Details Patient Name: Date of Service: Katherine BERTS, DO RIS S. 07/15/2022 10:00 A M Medical Record Number: [id number] Patient Account Number: [id number] Date of Birth/Sex: Treating RN: Feb 20, 1926 (88 y.o. [name] Primary Care Muneeb Veras: [name] Other Clinician: Referring Miki Blank: Treating Sharyon Peitz/Extender: [name] in Treatment: 3 Visit Information History Since Last Visit Added or deleted any medications: No Patient Arrived: Dan Humphreys Any new allergies or adverse reactions: No Arrival Time: 09:59 Had a fall or experienced change in No Accompanied By: daughter activities of daily living that may affect Transfer Assistance: None risk of falls: Patient Identification Verified: Yes Signs or symptoms of abuse/neglect since last visito No Secondary Verification Process Completed: Yes Hospitalized since last visit: No Patient Requires Transmission-Based Precautions: No Implantable device outside of the clinic excluding No Patient Has Alerts: No cellular tissue based products placed in the center since last visit: Has Dressing in Place as Prescribed: Yes Pain Present Now: No Electronic Signature(s) Signed: 07/15/2022 3:48:01 PM By: Samuella Bruin Entered By: Samuella Bruin on 07/15/2022 10:02:42 -------------------------------------------------------------------------------- Encounter Discharge Information Details Patient Name: Date of Service: Katherine Clement, DO RIS S. 07/15/2022 10:00 A M Medical Record Number: [id number] Patient Account Number: [id number] Date of Birth/Sex: Treating RN: [birth year] (88 y.o. [name] Primary Care Millee Denise: [name] Other Clinician: Referring Cheryllynn Sarff: Treating Timeka Goette/Extender: [name] in Treatment: 3 Encounter Discharge  Information Items Post Procedure Vitals Discharge Condition: Stable Temperature (F): 98.1 Ambulatory Status: Walker Pulse (bpm): 82 Discharge Destination: Home Respiratory Rate (breaths/min): 18 Transportation: Private Auto Blood Pressure (mmHg): 110/75 Accompanied By: daughter Schedule Follow-up Appointment: Yes Clinical Summary of Care: Patient Declined Electronic Signature(s) Signed: 07/15/2022 3:48:01 PM By: Samuella Bruin Entered By: Samuella Bruin on 07/15/2022 10:27:05 Clyda Hurdle ([id number]) [id number] Page 2 of 7 -------------------------------------------------------------------------------- Lower Extremity Assessment Details Patient Name: Date of Service: Katherine Clement, DO RIS S. 07/15/2022 10:00 A M Medical Record Number: [id number] Patient Account Number: [id number] Date of Birth/Sex: Treating RN: April 06, 1926 (88 y.o. [name] Primary Care Nikole Swartzentruber: [name] Other Clinician: Referring Lataisha Colan: Treating Sanav Remer/Extender: [name] in Treatment: 3 Edema Assessment Assessed: [Left: No] [Right: No] Edema: [Left: N] [Right: o] Calf Left: Right: Point of Measurement: From Medial Instep 28.5 cm Ankle Left: Right: Point of Measurement: From Medial Instep 18.5 cm Electronic Signature(s) Signed: 07/15/2022 3:48:01 PM By: Samuella Bruin Entered By: Samuella Bruin on 07/15/2022 10:05:57 -------------------------------------------------------------------------------- Multi Wound Chart Details Patient Name: Date of Service: Katherine Clement, DO RIS S. 07/15/2022 10:00 A M Medical Record Number: [id number] Patient Account Number: [id number] Date of Birth/Sex: Treating RN: 03-13-1926 (88 y.o. F) Primary Care Placida Cambre: [name] Other Clinician: Referring Athanasios Heldman: Treating Norval Slaven/Extender: [name] in Treatment: 3 Vital Signs Height(in):  67 Pulse(bpm): 82 Weight(lbs): Blood Pressure(mmHg): 110/75 Body Mass Index(BMI): Temperature(F): 98.1 Respiratory Rate(breaths/min): 18 [1:Photos:] [N/A:N/A] Left Calcaneus N/A N/A Wound Location: Pressure Injury N/A N/A Wounding Event: Pressure Ulcer N/A N/A Primary Etiology: Arrhythmia, Dementia N/A N/A Comorbid History: 04/01/2022 N/A N/A Date Acquired: 3 N/A N/A Weeks of Treatment: [name] ([id number]) [id number] Page 3 of 7 Open N/A N/A Wound Status: No N/A N/A Wound Recurrence: 0.5x1x0.1 N/A N/A Measurements L x W x D (cm) 0.393 N/A N/A A (cm) : rea 0.039 N/A N/A Volume (cm) : 70.40% N/A N/A % Reduction in A rea: 70.70% N/A N/A % Reduction in Volume: Category/Stage  III N/A N/A Classification: Medium N/A N/A Exudate A mount: Serosanguineous N/A N/A Exudate Type: red, brown N/A N/A Exudate Color: Distinct, outline attached N/A N/A Wound Margin: Large (67-100%) N/A N/A Granulation A mount: Red, Pink N/A N/A Granulation Quality: Small (1-33%) N/A N/A Necrotic A mount: Eschar, Adherent Slough N/A N/A Necrotic Tissue: Fat Layer (Subcutaneous Tissue): Yes N/A N/A Exposed Structures: Fascia: No Tendon: No Muscle: No Joint: No Bone: No Small (1-33%) N/A N/A Epithelialization: Debridement - Selective/Open Wound N/A N/A Debridement: Pre-procedure Verification/Time Out 10:10 N/A N/A Taken: Lidocaine 4% Topical Solution N/A N/A Pain Control: Necrotic/Eschar, Slough N/A N/A Tissue Debrided: Non-Viable Tissue N/A N/A Level: 0.5 N/A N/A Debridement A (sq cm): rea Curette N/A N/A Instrument: Minimum N/A N/A Bleeding: Pressure N/A N/A Hemostasis A chieved: Procedure was tolerated well N/A N/A Debridement Treatment Response: 0.5x1x0.1 N/A N/A Post Debridement Measurements L x W x D (cm) 0.039 N/A N/A Post Debridement Volume: (cm) Category/Stage III N/A N/A Post Debridement Stage: No Abnormalities  Noted N/A N/A Periwound Skin Texture: Dry/Scaly: Yes N/A N/A Periwound Skin Moisture: No Abnormalities Noted N/A N/A Periwound Skin Color: No Abnormality N/A N/A Temperature: Debridement N/A N/A Procedures Performed: Treatment Notes Electronic Signature(s) Signed: 07/15/2022 10:17:28 AM By: Katherine Maudlin MD FACS Entered By: [name] on 07/15/2022 10:17:28 -------------------------------------------------------------------------------- Multi-Disciplinary Care Plan Details Patient Name: Date of Service: Katherine Martes, DO RIS S. 07/15/2022 10:00 A M Medical Record Number: [id number] Patient Account Number: [id number] Date of Birth/Sex: Treating RN: [birth year] (88 y.o. [name] Primary Care Kammi Hechler: Jerrell Belfast Other Clinician: Referring Timiko Offutt: Treating Lora Glomski/Extender: Cipriano Bunker Weeks in Treatment: 3 Active Inactive Pressure Nursing Diagnoses: Knowledge deficit related to causes and risk factors for pressure ulcer development Knowledge deficit related to management of pressures ulcers Goals: Patient will remain free from development of additional pressure ulcers [name], [name] ([id number]) [id number] Page 4 of 7 Date Initiated: 06/24/2022 Target Resolution Date: 08/08/2022 Goal Status: Active Patient/caregiver will verbalize risk factors for pressure ulcer development Date Initiated: 06/24/2022 Target Resolution Date: 08/08/2022 Goal Status: Active Interventions: Assess: immobility, friction, shearing, incontinence upon admission and as needed Treatment Activities: Pressure reduction/relief device ordered : 06/24/2022 Notes: Wound/Skin Impairment Nursing Diagnoses: Impaired tissue integrity Knowledge deficit related to ulceration/compromised skin integrity Goals: Patient/caregiver will verbalize understanding of skin care regimen Date Initiated: 06/24/2022 Target Resolution Date:  08/08/2022 Goal Status: Active Interventions: Assess ulceration(s) every visit Treatment Activities: Skin care regimen initiated : 06/24/2022 Topical wound management initiated : 06/24/2022 Notes: Electronic Signature(s) Signed: 07/15/2022 3:48:01 PM By: Adline Peals Entered By: Adline Peals on 07/15/2022 10:09:38 -------------------------------------------------------------------------------- Pain Assessment Details Patient Name: Date of Service: Katherine Martes, DO RIS S. 07/15/2022 10:00 [place] Record Number: [id number] Patient Account Number: [id number] Date of Birth/Sex: Treating RN: [birth year] (88 y.o. [name] Primary Care Adiel Erney: Jerrell Belfast Other Clinician: Referring Evangelene Vora: Treating Ridhima Golberg/Extender: Cipriano Bunker Weeks in Treatment: 3 Active Problems Location of Pain Severity and Description of Pain Patient Has Paino No Site Locations Rate the pain. Current Pain Level: 0 [name], [name] ([id number]) [id number] Page 5 of 7 Pain Management and Medication Current Pain Management: Electronic Signature(s) Signed: 07/15/2022 3:48:01 PM By: Adline Peals Entered By: Adline Peals on 07/15/2022 10:03:04 -------------------------------------------------------------------------------- Patient/Caregiver Education Details Patient Name: Date of Service: Katherine Martes, DO RIS S. 11/14/2023andnbsp10:00 A M Medical Record Number: [id number] Patient Account Number: [id number] Date of Birth/Gender: Treating RN: March 16, 1926 (88 y.o. [name] Primary Care Physician: Jerrell Belfast Other Clinician: Referring Physician:  Treating Physician/Extender: Robyne Peers in Treatment: 3 Education Assessment Education Provided To: Patient Education Topics Provided Wound/Skin Impairment: Methods: Explain/Verbal Responses: Reinforcements needed, State content  correctly Electronic Signature(s) Signed: 07/15/2022 3:48:01 PM By: Adline Peals Entered By: Adline Peals on 07/15/2022 10:09:51 -------------------------------------------------------------------------------- Wound Assessment Details Patient Name: Date of Service: Katherine Martes, DO RIS S. 07/15/2022 10:00 A M Medical Record Number: [id number] Patient Account Number: [id number] Date of Birth/Sex: Treating RN: 1926-02-06 (88 y.o. [name] Primary Care Jamare Vanatta: Jerrell Belfast Other Clinician: Referring Mariely Mahr: Treating Bentleigh Waren/Extender: Cipriano Bunker Weeks in Treatment: 3 Wound Status Wound Number: 1 Primary Etiology: Pressure Ulcer Wound Location: Left Calcaneus Wound Status: Open Wounding Event: Pressure Injury Comorbid History: Arrhythmia, Dementia Date Acquired: 04/01/2022 Weeks Of Treatment: 3 Clustered Wound: No Photos [name], [name] ([id number]) [id number] Page 6 of 7 Wound Measurements Length: (cm) 0.5 Width: (cm) 1 Depth: (cm) 0.1 Area: (cm) 0.393 Volume: (cm) 0.039 % Reduction in Area: 70.4% % Reduction in Volume: 70.7% Epithelialization: Small (1-33%) Tunneling: No Undermining: No Wound Description Classification: Category/Stage III Wound Margin: Distinct, outline attached Exudate Amount: Medium Exudate Type: Serosanguineous Exudate Color: red, brown Foul Odor After Cleansing: No Slough/Fibrino Yes Wound Bed Granulation Amount: Large (67-100%) Exposed Structure Granulation Quality: Red, Pink Fascia Exposed: No Necrotic Amount: Small (1-33%) Fat Layer (Subcutaneous Tissue) Exposed: Yes Necrotic Quality: Eschar, Adherent Slough Tendon Exposed: No Muscle Exposed: No Joint Exposed: No Bone Exposed: No Periwound Skin Texture Texture Color No Abnormalities Noted: Yes No Abnormalities Noted: Yes Moisture Temperature / Pain No Abnormalities Noted: No Temperature: No Abnormality Dry  / Scaly: Yes Treatment Notes Wound #1 (Calcaneus) Wound Laterality: Left Cleanser Soap and Water Discharge Instruction: May shower and wash wound with dial antibacterial soap and water prior to dressing change. Wound Cleanser Discharge Instruction: Cleanse the wound with wound cleanser prior to applying a clean dressing using gauze sponges, not tissue or cotton balls. Peri-Wound Care Topical Primary Dressing Hydrofera Blue Ready Foam, 2.5 x2.5 in Discharge Instruction: Apply to wound bed as instructed Secondary Dressing ALLEVYN Heel 4 1/2in x 5 1/2in / 10.5cm x 13.5cm Discharge Instruction: Apply over primary dressing as directed. Woven Gauze Sponge, Non-Sterile 4x4 in Discharge Instruction: Apply over primary dressing as directed. Secured With [employer], 4.5x3.1 (in/yd) Discharge Instruction: Secure with Kerlix as directed. 36M Medipore H Soft Cloth Surgical T ape, 4 x 10 (in/yd) Discharge Instruction: Secure with tape as directed. [name], [name] ([id number]) [id number] Page 7 of 7 Compression Wrap Compression Stockings Add-Ons Electronic Signature(s) Signed: 07/15/2022 3:48:01 PM By: Adline Peals Entered By: Adline Peals on 07/15/2022 10:07:14 -------------------------------------------------------------------------------- Vitals Details Patient Name: Date of Service: Katherine Martes, DO RIS S. 07/15/2022 10:00 A M Medical Record Number: [id number] Patient Account Number: [id number] Date of Birth/Sex: Treating RN: [birth year] (88 y.o. [name] Primary Care Masey Scheiber: Jerrell Belfast Other Clinician: Referring Deeksha Cotrell: Treating Branndon Tuite/Extender: Cipriano Bunker Weeks in Treatment: 3 Vital Signs Time Taken: 10:02 Temperature (F): 98.1 Height (in): 67 Pulse (bpm): 82 Respiratory Rate (breaths/min): 18 Blood Pressure (mmHg): 110/75 Reference Range: 80 - 120 mg / dl Electronic Signature(s) Signed:  07/15/2022 3:48:01 PM By: Adline Peals Entered By: Adline Peals on 07/15/2022 10:02:58

## 2022-07-15 NOTE — Progress Notes (Signed)
Katherine, Singh ([id number]) [id number] Page 1 of 8 Visit Report for 07/15/2022 Chief Complaint Document Details Patient Name: Date of Service: RO Katherine Manges, DO RIS Singh. 07/15/2022 10:00 A M Medical Record Number: [id number] Patient Account Number: [id number] Date of Birth/Sex: Treating RN: 1926-05-12 (88 y.o. F) Primary Care Provider: [name] Other Clinician: Referring Provider: Treating Provider/Extender: [name] in Treatment: 3 Information Obtained from: Patient Chief Complaint Patient is at the clinic for treatment of an open pressure ulcer Electronic Signature(Singh) Signed: 07/15/2022 10:17:36 AM By: Duanne Guess MD FACS Entered By: Duanne Guess on 07/15/2022 10:17:35 -------------------------------------------------------------------------------- Debridement Details Patient Name: Date of Service: Katherine Clement, DO RIS Singh. 07/15/2022 10:00 A M Medical Record Number: [id number] Patient Account Number: [id number] Date of Birth/Sex: Treating RN: 20-Oct-1925 (88 y.o. [name] Primary Care Provider: [name] Other Clinician: Referring Provider: Treating Provider/Extender: [name] in Treatment: 3 Debridement Performed for Assessment: Wound #1 Left Calcaneus Performed By: Physician Duanne Guess, MD Debridement Type: Debridement Level of Consciousness (Pre-procedure): Awake and Alert Pre-procedure Verification/Time Out Yes - 10:10 Taken: Start Time: 10:10 Pain Control: Lidocaine 4% T opical Solution T Area Debrided (L x W): otal 0.5 (cm) x 1 (cm) = 0.5 (cm) Tissue and other material debrided: Non-Viable, Eschar, Slough, Slough Level: Non-Viable Tissue Debridement Description: Selective/Open Wound Instrument: Curette Bleeding: Minimum Hemostasis Achieved: Pressure Response to Treatment: Procedure was tolerated well Level of Consciousness (Post- Awake and  Alert procedure): Post Debridement Measurements of Total Wound Length: (cm) 0.5 Stage: Category/Stage III Width: (cm) 1 Depth: (cm) 0.1 Volume: (cm) 0.039 Character of Wound/Ulcer Post Debridement: Improved Post Procedure Diagnosis Same as Pre-procedure Katherine, Singh ([id number]) [id number] Page 2 of 8 Notes scribed for Dr. Lady Gary by Samuella Bruin, RN Electronic Signature(Singh) Signed: 07/15/2022 12:07:49 PM By: Duanne Guess MD FACS Signed: 07/15/2022 3:48:01 PM By: Gelene Mink By: Samuella Bruin on 07/15/2022 10:11:03 -------------------------------------------------------------------------------- HPI Details Patient Name: Date of Service: Katherine Clement, DO RIS Singh. 07/15/2022 10:00 A M Medical Record Number: [id number] Patient Account Number: [id number] Date of Birth/Sex: Treating RN: [birth year] (88 y.o. F) Primary Care Provider: [name] Other Clinician: Referring Provider: Treating Provider/Extender: [name] in Treatment: 3 History of Present Illness HPI Description: ADMISSION 06/24/2022 This is a 88 year old woman with fairly advanced dementia, history of stroke, atrial fibrillation, hypertension, chronic anticoagulation, and COPD. She resides in a facility. She is, unfortunately, not accompanied by anyone familiar with her medical history today and so what we are able to obtain is limited to the electronic medical record. She is not diabetic. For an unknown period of time, she has had an ulcer on her left heel. She denies pain. There was a dressing on the wound on intake, but it seems to have been there for quite some time as there was a significant odor present. Once her foot was washed, the odor dissipated. ABI in clinic was normal at 1.04. There is an oval ulceration on her posterior left calcaneus, there is some granulation tissue present which is a bit friable as well as slough and  nonviable subcutaneous tissue. Periwound skin is intact and there is no significant edema. No malodor or purulent drainage. 07/04/2022: The wound is smaller today. There is hypertrophic granulation tissue present as well as a little bit of slough. No concern for infection. Apparently she did get a Prevalon boot, but she takes it off and will not wear it. 07/15/2022: The wound continues to contract. The hypertrophic  granulation tissue has resolved. The surface is flush with the surrounding skin. Light slough on the wound surface. Electronic Signature(Singh) Signed: 07/15/2022 10:18:09 AM By: Duanne Guess MD FACS Entered By: Duanne Guess on 07/15/2022 10:18:09 -------------------------------------------------------------------------------- Physical Exam Details Patient Name: Date of Service: Katherine Clement, DO RIS Singh. 07/15/2022 10:00 A M Medical Record Number: [id number] Patient Account Number: [id number] Date of Birth/Sex: Treating RN: 07/20/1926 (88 y.o. F) Primary Care Provider: [name] Other Clinician: Referring Provider: Treating Provider/Extender: [name] in Treatment: 3 Constitutional . . . . No acute distress.[name] Respiratory Normal work of breathing on room air.. Notes 07/15/2022: The wound continues to contract. The hypertrophic granulation tissue has resolved. The surface is flush with the surrounding skin. Light slough on the wound surface. Katherine, Singh ([id number]) [id number] Page 3 of 8 Electronic Signature(Singh) Signed: 07/15/2022 10:18:32 AM By: Duanne Guess MD FACS Entered By: Duanne Guess on 07/15/2022 10:18:32 -------------------------------------------------------------------------------- Physician Orders Details Patient Name: Date of Service: Katherine Clement, DO RIS Singh. 07/15/2022 10:00 A M Medical Record Number: [id number] Patient Account Number: [id number] Date of Birth/Sex: Treating RN: 1926/05/17  (88 y.o. [name] Primary Care Provider: [name] Other Clinician: Referring Provider: Treating Provider/Extender: [name] in Treatment: 3 Verbal / Phone Orders: No Diagnosis Coding ICD-10 Coding Code Description [phone] Pressure ulcer of left heel, stage 3 J44.9 Chronic obstructive pulmonary disease, unspecified G30.9 Alzheimer'Singh disease, unspecified Dementia in other diseases classified elsewhere, unspecified severity, without behavioral disturbance, psychotic disturbance, mood F02.80 disturbance, and anxiety E44.0 Moderate protein-calorie malnutrition R29.6 Repeated falls Z79.01 Long term (current) use of anticoagulants I48.0 Paroxysmal atrial fibrillation I95.9 Hypotension, unspecified Follow-up Appointments ppointment in 2 weeks. - Dr. Lady Gary - room 2 Return A Anesthetic (In clinic) Topical Lidocaine 4% applied to wound bed Bathing/ Shower/ Hygiene May shower and wash wound with soap and water. - with dressing changes Edema Control - Lymphedema / SCD / Other Avoid standing for long periods of time. Off-Loading Other: - stay off of foot as much as possible float heels (no pressure to left heels) prevalon boot to left foot Home Health No change in wound care orders this week; continue Home Health for wound care. May utilize formulary equivalent dressing for wound treatment orders unless otherwise specified. Other Home Health Orders/Instructions: - [facility] Wound Treatment Wound #1 - Calcaneus Wound Laterality: Left Cleanser: Soap and Water Every Other Day/30 Days Discharge Instructions: May shower and wash wound with dial antibacterial soap and water prior to dressing change. Cleanser: Wound Cleanser Every Other Day/30 Days Discharge Instructions: Cleanse the wound with wound cleanser prior to applying a clean dressing using gauze sponges, not tissue or cotton balls. Prim Dressing: Hydrofera Blue Ready Foam, 2.5 x2.5 in  Every Other Day/30 Days ary Discharge Instructions: Apply to wound bed as instructed Secondary Dressing: ALLEVYN Heel 4 1/2in x 5 1/2in / 10.5cm x 13.5cm Every Other Day/30 Days Discharge Instructions: Apply over primary dressing as directed. Secondary Dressing: Woven Gauze Sponge, Non-Sterile 4x4 in Every Other Day/30 Days TERRAH, DECOSTER ([id number]) [id number] Page 4 of 8 Discharge Instructions: Apply over primary dressing as directed. Secured With: [employer], 4.5x3.1 (in/yd) Every Other Day/30 Days Discharge Instructions: Secure with Kerlix as directed. Secured With: 49M Medipore H Soft Cloth Surgical T ape, 4 x 10 (in/yd) Every Other Day/30 Days Discharge Instructions: Secure with tape as directed. Patient Medications llergies: codeine, penicillin, Sulfa (Sulfonamide Antibiotics) A Notifications Medication Indication Start End 07/15/2022 lidocaine DOSE topical 4 %  cream - cream topical Electronic Signature(Singh) Signed: 07/15/2022 12:07:49 PM By: Duanne Guessannon, Jhovanny Guinta MD FACS Entered By: Duanne Guessannon, Ike Maragh on 07/15/2022 10:18:45 -------------------------------------------------------------------------------- Problem List Details Patient Name: Date of Service: Katherine ClementO BERTS, DO RIS Singh. 07/15/2022 10:00 A M Medical Record Number: [id number] Patient Account Number: [id number] Date of Birth/Sex: Treating RN: 04/09/1926 (88 y.o. F) Primary Care Provider: Flonnie HailstoneHeaton, Shannon Other Clinician: Referring Provider: Treating Provider/Extender: Earley Brookeannon, Zaven Klemens Heaton, Shannon Weeks in Treatment: 3 Active Problems ICD-10 Encounter Code Description Active Date MDM Diagnosis [phone] Pressure ulcer of left heel, stage 3 06/24/2022 No Yes J44.9 Chronic obstructive pulmonary disease, unspecified 06/24/2022 No Yes G30.9 Alzheimer'Singh disease, unspecified 06/24/2022 No Yes F02.80 Dementia in other diseases classified elsewhere, unspecified severity, without 06/24/2022 No  Yes behavioral disturbance, psychotic disturbance, mood disturbance, and anxiety E44.0 Moderate protein-calorie malnutrition 06/24/2022 No Yes R29.6 Repeated falls 06/24/2022 No Yes Z79.01 Long term (current) use of anticoagulants 06/24/2022 No Yes I48.0 Paroxysmal atrial fibrillation 06/24/2022 No Yes Katherine HurdleROBERTS, [name] ([id number]) [id number] Page 5 of 8 I95.9 Hypotension, unspecified 06/24/2022 No Yes Inactive Problems Resolved Problems Electronic Signature(Singh) Signed: 07/15/2022 10:17:23 AM By: Duanne Guessannon, Dean Wonder MD FACS Entered By: Duanne Guessannon, Kayley Zeiders on 07/15/2022 10:17:23 -------------------------------------------------------------------------------- Progress Note Details Patient Name: Date of Service: Katherine ClementO BERTS, DO RIS Singh. 07/15/2022 10:00 A M Medical Record Number: [id number] Patient Account Number: [id number] Date of Birth/Sex: Treating RN: 10/24/1925 (88 y.o. F) Primary Care Provider: Flonnie HailstoneHeaton, Shannon Other Clinician: Referring Provider: Treating Provider/Extender: Ginny Forthannon, Cleona Doubleday Heaton, Shannon Weeks in Treatment: 3 Subjective Chief Complaint Information obtained from Patient Patient is at the clinic for treatment of an open pressure ulcer History of Present Illness (HPI) ADMISSION 06/24/2022 This is a 88 year old woman with fairly advanced dementia, history of stroke, atrial fibrillation, hypertension, chronic anticoagulation, and COPD. She resides in a facility. She is, unfortunately, not accompanied by anyone familiar with her medical history today and so what we are able to obtain is limited to the electronic medical record. She is not diabetic. For an unknown period of time, she has had an ulcer on her left heel. She denies pain. There was a dressing on the wound on intake, but it seems to have been there for quite some time as there was a significant odor present. Once her foot was washed, the odor dissipated. ABI in clinic was normal at  1.04. There is an oval ulceration on her posterior left calcaneus, there is some granulation tissue present which is a bit friable as well as slough and nonviable subcutaneous tissue. Periwound skin is intact and there is no significant edema. No malodor or purulent drainage. 07/04/2022: The wound is smaller today. There is hypertrophic granulation tissue present as well as a little bit of slough. No concern for infection. Apparently she did get a Prevalon boot, but she takes it off and will not wear it. 07/15/2022: The wound continues to contract. The hypertrophic granulation tissue has resolved. The surface is flush with the surrounding skin. Light slough on the wound surface. Patient History Unable to Obtain Patient History due to Dementia. Medical History Cardiovascular Patient has history of Arrhythmia - a-fib Neurologic Patient has history of Dementia Medical A Surgical History Notes nd Ear/Nose/Mouth/Throat dyspahgia [address]Objective Katherine HurdleROBERTS, [name] ([id number]) [id number] Page 6 of 8 Constitutional No acute distress.. Vitals Time Taken: 10:02 AM, Height: 67 in, Temperature: 98.1 F, Pulse: 82 bpm, Respiratory Rate: 18 breaths/min, Blood Pressure: 110/75 mmHg. Respiratory Normal work of breathing on room air.. General Notes: 07/15/2022: The wound continues to contract. The hypertrophic granulation tissue  has resolved. The surface is flush with the surrounding skin. Light slough on the wound surface. Integumentary (Hair, Skin) Wound #1 status is Open. Original cause of wound was Pressure Injury. The date acquired was: 04/01/2022. The wound has been in treatment 3 weeks. The wound is located on the Left Calcaneus. The wound measures 0.5cm length x 1cm width x 0.1cm depth; 0.393cm^2 area and 0.039cm^3 volume. There is Fat Layer (Subcutaneous Tissue) exposed. There is no tunneling or undermining noted. There is a medium amount of serosanguineous drainage noted.  The wound margin is distinct with the outline attached to the wound base. There is large (67-100%) red, pink granulation within the wound bed. There is a small (1-33%) amount of necrotic tissue within the wound bed including Eschar and Adherent Slough. The periwound skin appearance had no abnormalities noted for texture. The periwound skin appearance had no abnormalities noted for color. The periwound skin appearance exhibited: Dry/Scaly. Periwound temperature was noted as No Abnormality. Assessment Active Problems ICD-10 Pressure ulcer of left heel, stage 3 Chronic obstructive pulmonary disease, unspecified Alzheimer'Singh disease, unspecified Dementia in other diseases classified elsewhere, unspecified severity, without behavioral disturbance, psychotic disturbance, mood disturbance, and anxiety Moderate protein-calorie malnutrition Repeated falls Long term (current) use of anticoagulants Paroxysmal atrial fibrillation Hypotension, unspecified Procedures Wound #1 Pre-procedure diagnosis of Wound #1 is a Pressure Ulcer located on the Left Calcaneus . There was a Selective/Open Wound Non-Viable Tissue Debridement with a total area of 0.5 sq cm performed by Duanne Guess, MD. With the following instrument(Singh): Curette to remove Non-Viable tissue/material. Material removed includes Eschar and Slough and after achieving pain control using Lidocaine 4% T opical Solution. No specimens were taken. A time out was conducted at 10:10, prior to the start of the procedure. A Minimum amount of bleeding was controlled with Pressure. The procedure was tolerated well. Post Debridement Measurements: 0.5cm length x 1cm width x 0.1cm depth; 0.039cm^3 volume. Post debridement Stage noted as Category/Stage III. Character of Wound/Ulcer Post Debridement is improved. Post procedure Diagnosis Wound #1: Same as Pre-Procedure General Notes: scribed for Dr. Lady Gary by Samuella Bruin, RN. Plan Follow-up  Appointments: Return Appointment in 2 weeks. - Dr. Lady Gary - room 2 Anesthetic: (In clinic) Topical Lidocaine 4% applied to wound bed Bathing/ Shower/ Hygiene: May shower and wash wound with soap and water. - with dressing changes Edema Control - Lymphedema / SCD / Other: Avoid standing for long periods of time. Off-Loading: Other: - stay off of foot as much as possible float heels (no pressure to left heels) prevalon boot to left foot Home Health: No change in wound care orders this week; continue Home Health for wound care. May utilize formulary equivalent dressing for wound treatment orders unless otherwise specified. Other Home Health Orders/Instructions: [facility] The following medication(Singh) was prescribed: lidocaine topical 4 % cream cream topical was prescribed at facility WOUND #1: - Calcaneus Wound Laterality: Left Cleanser: Soap and Water Every Other Day/30 Days Discharge Instructions: May shower and wash wound with dial antibacterial soap and water prior to dressing change. Cleanser: Wound Cleanser Every Other Day/30 Days Discharge Instructions: Cleanse the wound with wound cleanser prior to applying a clean dressing using gauze sponges, not tissue or cotton balls. Prim Dressing: Hydrofera Blue Ready Foam, 2.5 x2.5 in Every Other Day/30 Days ary Discharge Instructions: Apply to wound bed as instructed Secondary Dressing: ALLEVYN Heel 4 1/2in x 5 1/2in / 10.5cm x 13.5cm Every Other Day/30 Days [name], [name] ([id number]) [id number] Page 7 of 8 Discharge  Instructions: Apply over primary dressing as directed. Secondary Dressing: Woven Gauze Sponge, Non-Sterile 4x4 in Every Other Day/30 Days Discharge Instructions: Apply over primary dressing as directed. Secured With: [employer], 4.5x3.1 (in/yd) Every Other Day/30 Days Discharge Instructions: Secure with Kerlix as directed. Secured With: 59M Medipore H Soft Cloth Surgical T ape, 4 x 10 (in/yd)  Every Other Day/30 Days Discharge Instructions: Secure with tape as directed. 07/15/2022: The wound continues to contract. The hypertrophic granulation tissue has resolved. The surface is flush with the surrounding skin. Light slough on the wound surface. I used a curette to debride slough and eschar from the wound. We will continue to use the [facility] with a heel cup. She takes her Prevalon boot off, apparently. Her daughter was reminded that she needs to float her heels whenever she is in bed and she indicated she would communicate this to the patient'Singh facility. Follow-up in 2 weeks. Electronic Signature(Singh) Signed: 07/15/2022 10:19:23 AM By: Duanne Guess MD FACS Entered By: Duanne Guess on 07/15/2022 10:19:23 -------------------------------------------------------------------------------- HxROS Details Patient Name: Date of Service: Katherine Clement, DO RIS Singh. 07/15/2022 10:00 A M Medical Record Number: [id number] Patient Account Number: [id number] Date of Birth/Sex: Treating RN: [birth year] (88 y.o. F) Primary Care Provider: [name] Other Clinician: Referring Provider: Treating Provider/Extender: [name] in Treatment: 3 Unable to Obtain Patient History due to Dementia Ear/Nose/Mouth/Throat Medical History: Past Medical History Notes: dyspahgia Cardiovascular Medical History: Positive for: Arrhythmia - a-fib Neurologic Medical History: Positive for: Dementia Immunizations Pneumococcal Vaccine: Received Pneumococcal Vaccination: No Implantable Devices No devices added Electronic Signature(Singh) Signed: 07/15/2022 12:07:49 PM By: Duanne Guess MD FACS Entered By: Duanne Guess on 07/15/2022 10:18:14 [name] ([id number]) [id number] Page 8 of 8 -------------------------------------------------------------------------------- SuperBill Details Patient Name: Date of Service: RO Katherine Manges, DO RIS Singh.  07/15/2022 Medical Record Number: [id number] Patient Account Number: [id number] Date of Birth/Sex: Treating RN: [birth year] (88 y.o. F) Primary Care Provider: [name] Other Clinician: Referring Provider: Treating Provider/Extender: [name] in Treatment: 3 Diagnosis Coding ICD-10 Codes Code Description [phone] Pressure ulcer of left heel, stage 3 J44.9 Chronic obstructive pulmonary disease, unspecified G30.9 Alzheimer'Singh disease, unspecified Dementia in other diseases classified elsewhere, unspecified severity, without behavioral disturbance, psychotic disturbance, mood F02.80 disturbance, and anxiety E44.0 Moderate protein-calorie malnutrition R29.6 Repeated falls Z79.01 Long term (current) use of anticoagulants I48.0 Paroxysmal atrial fibrillation I95.9 Hypotension, unspecified Facility Procedures : CPT4 Code: [id number] Description: 97597 - DEBRIDE WOUND 1ST 20 SQ CM OR < ICD-10 Diagnosis Description L89.623 Pressure ulcer of left heel, stage 3 Modifier: Quantity: 1 Physician Procedures : CPT4: Description Modifier Code [id number] - WC PHYS LEVEL 3 - EST PT 25 ICD-10 Diagnosis Description L89.623 Pressure ulcer of left heel, stage 3 E44.0 Moderate protein-calorie malnutrition J44.9 Chronic obstructive pulmonary disease, unspecified  F02.80 Dementia in other diseases classified elsewhere, unspecified severity, without behavioral disturbance, p disturbance, mood disturbance, and anxiety Quantity: 1 sychotic : CPT4: [id number] 97597 - WC PHYS DEBR WO ANESTH 20 SQ CM ICD-10 Diagnosis Description L89.623 Pressure ulcer of left heel, stage 3 Quantity: 1 Electronic Signature(Singh) Signed: 07/15/2022 10:19:49 AM By: Duanne Guess MD FACS Entered By: Duanne Guess on 07/15/2022 10:19:49

## 2022-07-17 ENCOUNTER — Ambulatory Visit: Payer: PPO | Admitting: Neurology

## 2022-07-17 ENCOUNTER — Encounter: Payer: Self-pay | Admitting: Neurology

## 2022-07-17 VITALS — BP 107/69 | HR 80 | Ht 62.0 in | Wt 93.6 lb

## 2022-07-17 DIAGNOSIS — I4891 Unspecified atrial fibrillation: Secondary | ICD-10-CM | POA: Diagnosis not present

## 2022-07-17 DIAGNOSIS — Z8673 Personal history of transient ischemic attack (TIA), and cerebral infarction without residual deficits: Secondary | ICD-10-CM | POA: Diagnosis not present

## 2022-07-17 DIAGNOSIS — F02811 Dementia in other diseases classified elsewhere, unspecified severity, with agitation: Secondary | ICD-10-CM | POA: Diagnosis not present

## 2022-07-17 DIAGNOSIS — F03918 Unspecified dementia, unspecified severity, with other behavioral disturbance: Secondary | ICD-10-CM

## 2022-07-17 DIAGNOSIS — I10 Essential (primary) hypertension: Secondary | ICD-10-CM | POA: Diagnosis not present

## 2022-07-17 DIAGNOSIS — H9190 Unspecified hearing loss, unspecified ear: Secondary | ICD-10-CM | POA: Diagnosis not present

## 2022-07-17 DIAGNOSIS — F0280 Dementia in other diseases classified elsewhere without behavioral disturbance: Secondary | ICD-10-CM | POA: Diagnosis not present

## 2022-07-17 DIAGNOSIS — G309 Alzheimer's disease, unspecified: Secondary | ICD-10-CM

## 2022-07-17 DIAGNOSIS — F02818 Dementia in other diseases classified elsewhere, unspecified severity, with other behavioral disturbance: Secondary | ICD-10-CM | POA: Diagnosis not present

## 2022-07-17 DIAGNOSIS — F32A Depression, unspecified: Secondary | ICD-10-CM | POA: Diagnosis not present

## 2022-07-17 DIAGNOSIS — Z96642 Presence of left artificial hip joint: Secondary | ICD-10-CM | POA: Diagnosis not present

## 2022-07-17 DIAGNOSIS — F411 Generalized anxiety disorder: Secondary | ICD-10-CM | POA: Diagnosis not present

## 2022-07-17 DIAGNOSIS — L89623 Pressure ulcer of left heel, stage 3: Secondary | ICD-10-CM | POA: Diagnosis not present

## 2022-07-17 DIAGNOSIS — M80052D Age-related osteoporosis with current pathological fracture, left femur, subsequent encounter for fracture with routine healing: Secondary | ICD-10-CM | POA: Diagnosis not present

## 2022-07-17 DIAGNOSIS — E43 Unspecified severe protein-calorie malnutrition: Secondary | ICD-10-CM | POA: Diagnosis not present

## 2022-07-17 DIAGNOSIS — Z7901 Long term (current) use of anticoagulants: Secondary | ICD-10-CM | POA: Diagnosis not present

## 2022-07-17 DIAGNOSIS — F05 Delirium due to known physiological condition: Secondary | ICD-10-CM | POA: Diagnosis not present

## 2022-07-17 DIAGNOSIS — D539 Nutritional anemia, unspecified: Secondary | ICD-10-CM | POA: Diagnosis not present

## 2022-07-17 DIAGNOSIS — K219 Gastro-esophageal reflux disease without esophagitis: Secondary | ICD-10-CM | POA: Diagnosis not present

## 2022-07-17 DIAGNOSIS — Z9181 History of falling: Secondary | ICD-10-CM | POA: Diagnosis not present

## 2022-07-17 DIAGNOSIS — F0284 Dementia in other diseases classified elsewhere, unspecified severity, with anxiety: Secondary | ICD-10-CM | POA: Diagnosis not present

## 2022-07-17 DIAGNOSIS — F0283 Dementia in other diseases classified elsewhere, unspecified severity, with mood disturbance: Secondary | ICD-10-CM | POA: Diagnosis not present

## 2022-07-17 NOTE — Progress Notes (Signed)
NEUROLOGY FOLLOW UP OFFICE NOTE  [name] [id number] 1925-11-14  HISTORY OF PRESENT ILLNESS: I had the pleasure of seeing [name] in follow-up in the neurology clinic on 07/17/2022.  The patient was last seen 6 months ago for dementia. She is again accompanied by her daughter [name] who helps supplement the history today.  Records and images were personally reviewed where available.  Neuropsychological testing in 08/2021 indicated Major Neurocognitive disorder,most lkely etiology is late-onset Alzheimer's disease with possibility for a vascular contribution (chronic left frontal infarct and moderate chronic microvascular disease). MRI brain without contrast 04/2021 did not show any acute changes, there was moderate diffuse atrophy and chronic microvascular disease, chronic left parietal lobe infarct.   Since her last visit, [name] contacted our office in July 2023 to report that her mother was becoming increasingly angry, lashing out physically. She would get manic (active, driven, mind racing, irritable) in the Spring and Summer. Depakote was increased to 500mg  BID, however it was too strong for her mother. She was off balance and thinking was slowed. She was having more urinary incontinence and changing clothes several times. Dose reduced to 125mg  in AM, 500mg  in PM. [name] updated that this seemed to be working better with mood, "like she's a new person." She fell and broke her hip in August 2023 and was transferred to [facility]. She had difficulty with transition and depression. A few weeks ago, her PCP was contacted about worsening depression, sleeping 18 hours a day and skipping meals, which is a pattern when she is spiraling downward. UA done negative for UTI. She reports feeling fine. She denies any hip pain, no headaches, dizziness. She has had 3-4 more falls since moving to [facility] Care. She has a September 2023 during the day, in addition to [facility] staff. Behaviors overall  tolerable but she does not want to cooperate, "everything is a big No." Her sitters can get her to shower. There is some paranoia (baseline prior to dementia), no hallucinations. The Depakote is doing well, except when she has a UTI and "nothing helps." No episodes of loss of consciousness since 2021.    PAST MEDICAL HISTORY: Past Medical History:  Diagnosis Date   Acute ischemic stroke 11/08/2018   left postcentral gyri; left frontal   Adjustment disorder with depressed mood 10/16/2019   Alteration consciousness 03/08/2020   Apraxia following cerebrovascular accident 10/16/2019   B12 deficiency 10/16/2019   Bilateral impacted cerumen 04/02/2021   Body mass index (BMI) less than 19 02/16/2020   Chronic kidney disease, stage II (mild) 12/06/2019   Closed fracture of one rib of right side with routine healing 06/30/2017   Confusion    COPD (chronic obstructive pulmonary disease) 11/16/2017   Current use of long term anticoagulation 07/21/2017   Essential hypertension, benign 10/16/2019   First degree atrioventricular block 06/26/2017   Gait abnormality 09/24/2020   Hammer toe 06/26/2017   Hearing loss    Hyperlipidemia 06/26/2017   Laceration of right lower leg 06/18/2020   Major neurocognitive disorder due to Alzheimer's disease 08/16/2021   Malnutrition of moderate degree 12/06/2019   Memory loss    Other specified cardiac arrhythmias 06/26/2017   Pain in limb 06/26/2017   Palpitations 06/26/2017   Paroxysmal atrial fibrillation 06/26/2017   remote history post operatively, CHADS 2 score=2   Premature atrial complex 06/26/2017   Premature ventricular contractions 06/26/2017   PSVT (paroxysmal supraventricular tachycardia) 11/10/2017   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 45 capsule 3   atorvastatin (LIPITOR) 40 MG tablet TAKE 1 TABLET(40 MG) BY MOUTH DAILY 90 tablet 0   CALCIUM PO Take 250 mg by mouth daily.     Cholecalciferol (VITAMIN D3) 1000 units CAPS Take 1,000 Units by mouth daily.     divalproex (DEPAKOTE ER) 500 MG 24 hr tablet TAKE 1 TABLET(500 MG) BY MOUTH AT BEDTIME 90 tablet 1   divalproex (DEPAKOTE) 125 MG DR tablet TAKE 1 TABLET BY MOUTH EVERY MORNING 90 tablet 0   ELIQUIS 2.5 MG TABS tablet TAKE 1 TABLET(2.5 MG) BY MOUTH TWICE DAILY 180 tablet 1   midodrine (PROAMATINE) 5 MG tablet Take 1 tablet (5 mg total) by mouth 2 (two) times daily. 180 tablet 3   mirtazapine (REMERON) 30 MG tablet TAKE 1 TABLET(30 MG) BY MOUTH AT BEDTIME 90 tablet 1   nitrofurantoin, macrocrystal-monohydrate, (MACROBID) 100 MG capsule Take 1 capsule (100 mg total) by mouth 2 (two) times daily. 20 capsule 0   pantoprazole (PROTONIX) 40 MG tablet Take 40 mg by mouth daily.     vitamin B-12 (CYANOCOBALAMIN) 500 MCG tablet Take 500 mcg by mouth daily.      No current facility-administered medications on file prior to visit.    ALLERGIES: Allergies  Allergen Reactions   Codeine Nausea And Vomiting   Penicillins Rash   Sulfa Antibiotics Nausea And Vomiting    FAMILY HISTORY: Family History  Problem Relation Age of Onset   Bone cancer Mother    Breast cancer Father    Diabetes Father    Heart disease Father    Dementia Sister    Breast cancer Sister    Alzheimer's disease Sister    Dementia Brother     SOCIAL HISTORY: Social History   Socioeconomic History   Marital status: Widowed    Spouse name: Not on file   Number of children: 2   Years of education: 12   Highest education level: High school graduate  Occupational History   Occupation: Retired  Tobacco Use   Smoking status: Never    Passive exposure: Past   Smokeless tobacco: Never  Vaping Use   Vaping  Use: Never used  Substance and Sexual Activity   Alcohol use: No   Drug use: No   Sexual activity: Not Currently  Other Topics Concern   Not on file  Social History Narrative   Mrs. Dafoe is widowed. Her daughter, [name], is her POA and lives 12 miles away. She resides in her own home, alone for last 7 years. Since her CVA she has had someone with her most of the time. She has a hired caregiver or a family member stays most of the day. She feel safe in her home and in her neighborhood.   Right-handed.   0.5 cups caffeine per day.   One story home   Social Determinants of Health   Financial Resource Strain: Low Risk  (01/10/2019)   Overall Financial Resource Strain (CARDIA)    Difficulty of Paying Living Expenses: Not hard at all  Food Insecurity: No Food Insecurity (01/10/2019)   Hunger Vital Sign    Worried About Running Out of Food in the Last Year: Never true    Ran Out  of Food in the Last Year: Never true  Transportation Needs: No Transportation Needs (01/10/2019)   PRAPARE - Administrator, Civil Service (Medical): No    Lack of Transportation (Non-Medical): No  Physical Activity: Inactive (01/10/2019)   Exercise Vital Sign    Days of Exercise per Week: 0 days    Minutes of Exercise per Session: 0 min  Stress: No Stress Concern Present (01/10/2019)   [employer] of Occupational Health - Occupational Stress Questionnaire    Feeling of Stress : Not at all  Social Connections: Somewhat Isolated (01/10/2019)   Social Connection and Isolation Panel [NHANES]    Frequency of Communication with Friends and Family: More than three times a week    Frequency of Social Gatherings with Friends and Family: More than three times a week    Attends Religious Services: More than 4 times per year    Active Member of [employer] or Organizations: No    Attends Banker Meetings: Never    Marital Status: Widowed  Intimate Partner Violence: Not on file      PHYSICAL EXAM: Vitals:   07/17/22 1122  BP: 107/69  Pulse: 80  SpO2: 97%   General: No acute distress, tired-appearing, flat affect Head:  Normocephalic/atraumatic Skin/Extremities: No rash, no edema. Left thumb is hyperflexed Neurological Exam: alert and oriented to person. When reminded it is Thanksgiving, she states month is November, year is "20something." Minimal verbal output. No aphasia or dysarthria. Fund of knowledge is reduced.  Recent and remote memory are impaired. Attention and concentration are reduced.   Cranial nerves: Pupils equal, round. Extraocular movements intact with no nystagmus. Visual fields full.  No facial asymmetry.  Motor: Bulk and tone normal, muscle strength 5/5 throughout with no pronator drift.   Finger to nose testing intact.  Gait slow and cautious with walker, right knee bent.   IMPRESSION: This is a pleasant 88 yo RH woman with atrial fibrillation on Eliquis, hyperlipidemia, stroke in 2020, with mixed Alzheimer's and vascular dementia on Neuropsychological evaluation done 08/2021. She is now in Memory Care. Depakote 125mg  in AM, 500mg  in PM overall helps with mood stabilization. She has not had any loss of consciousness since 2021. Continue follow-up with PCP for depression. Continue close supervision. Follow-up in 6 months, call for any changes.    Thank you for allowing me to participate in her care.  Please do not hesitate to call for any questions or concerns.    , M.D.   CC: Dr. 2022

## 2022-07-17 NOTE — Patient Instructions (Signed)
Good to see you. Continue all your medications. Continue 24/7 care. Follow-up in 6 months, all for any changes.

## 2022-07-18 ENCOUNTER — Encounter: Payer: Self-pay | Admitting: Neurology

## 2022-07-20 DIAGNOSIS — S72142D Displaced intertrochanteric fracture of left femur, subsequent encounter for closed fracture with routine healing: Secondary | ICD-10-CM | POA: Diagnosis not present

## 2022-07-21 DIAGNOSIS — L89622 Pressure ulcer of left heel, stage 2: Secondary | ICD-10-CM | POA: Diagnosis not present

## 2022-07-28 DIAGNOSIS — E43 Unspecified severe protein-calorie malnutrition: Secondary | ICD-10-CM

## 2022-07-28 DIAGNOSIS — F02818 Dementia in other diseases classified elsewhere, unspecified severity, with other behavioral disturbance: Secondary | ICD-10-CM | POA: Diagnosis not present

## 2022-07-28 DIAGNOSIS — F02811 Dementia in other diseases classified elsewhere, unspecified severity, with agitation: Secondary | ICD-10-CM | POA: Diagnosis not present

## 2022-07-28 DIAGNOSIS — K219 Gastro-esophageal reflux disease without esophagitis: Secondary | ICD-10-CM

## 2022-07-28 DIAGNOSIS — G309 Alzheimer's disease, unspecified: Secondary | ICD-10-CM | POA: Diagnosis not present

## 2022-07-28 DIAGNOSIS — F324 Major depressive disorder, single episode, in partial remission: Secondary | ICD-10-CM | POA: Diagnosis not present

## 2022-07-28 DIAGNOSIS — I4891 Unspecified atrial fibrillation: Secondary | ICD-10-CM | POA: Diagnosis not present

## 2022-07-28 DIAGNOSIS — F05 Delirium due to known physiological condition: Secondary | ICD-10-CM

## 2022-07-28 DIAGNOSIS — D539 Nutritional anemia, unspecified: Secondary | ICD-10-CM | POA: Diagnosis not present

## 2022-07-28 DIAGNOSIS — L89623 Pressure ulcer of left heel, stage 3: Secondary | ICD-10-CM | POA: Diagnosis not present

## 2022-07-28 DIAGNOSIS — F0284 Dementia in other diseases classified elsewhere, unspecified severity, with anxiety: Secondary | ICD-10-CM | POA: Diagnosis not present

## 2022-07-28 DIAGNOSIS — I10 Essential (primary) hypertension: Secondary | ICD-10-CM | POA: Diagnosis not present

## 2022-07-28 DIAGNOSIS — H9190 Unspecified hearing loss, unspecified ear: Secondary | ICD-10-CM

## 2022-07-28 DIAGNOSIS — F411 Generalized anxiety disorder: Secondary | ICD-10-CM | POA: Diagnosis not present

## 2022-07-28 DIAGNOSIS — M80052D Age-related osteoporosis with current pathological fracture, left femur, subsequent encounter for fracture with routine healing: Secondary | ICD-10-CM | POA: Diagnosis not present

## 2022-07-28 DIAGNOSIS — F0283 Dementia in other diseases classified elsewhere, unspecified severity, with mood disturbance: Secondary | ICD-10-CM | POA: Diagnosis not present

## 2022-07-29 ENCOUNTER — Ambulatory Visit: Payer: PPO

## 2022-07-29 DIAGNOSIS — B351 Tinea unguium: Secondary | ICD-10-CM | POA: Diagnosis not present

## 2022-07-29 DIAGNOSIS — I7091 Generalized atherosclerosis: Secondary | ICD-10-CM | POA: Diagnosis not present

## 2022-07-29 DIAGNOSIS — M2041 Other hammer toe(s) (acquired), right foot: Secondary | ICD-10-CM | POA: Diagnosis not present

## 2022-07-29 DIAGNOSIS — M2042 Other hammer toe(s) (acquired), left foot: Secondary | ICD-10-CM | POA: Diagnosis not present

## 2022-07-30 ENCOUNTER — Encounter (HOSPITAL_BASED_OUTPATIENT_CLINIC_OR_DEPARTMENT_OTHER): Payer: PPO | Admitting: General Surgery

## 2022-07-30 ENCOUNTER — Telehealth: Payer: Self-pay

## 2022-07-30 DIAGNOSIS — L89623 Pressure ulcer of left heel, stage 3: Secondary | ICD-10-CM | POA: Diagnosis not present

## 2022-07-30 NOTE — Telephone Encounter (Signed)
Micah from Assisted living called and wanted to see if she could get an order for monthly vitals and weight for patient as well as possible shakes between meals?  Please advise.  Fax is [phone]

## 2022-07-30 NOTE — Progress Notes (Signed)
[name], [name] ([id number]) [id number] Page 1 of 6 Visit Report for 07/30/2022 Chief Complaint Document Details Patient Name: Date of Service: [name], DO RIS S. 07/30/2022 10:00 A M Medical Record Number: [id number] Patient Account Number: [id number] Date of Birth/Sex: Treating RN: 07-06-1926 (88 y.o. F) Primary Care Provider: [name] Other Clinician: Referring Provider: Treating Provider/Extender: [name] in Treatment: 5 Information Obtained from: Patient Chief Complaint Patient is at the clinic for treatment of an open pressure ulcer Electronic Signature(s) Signed: 07/30/2022 11:11:28 AM By: Duanne Guess MD FACS Entered By: Duanne Guess on 07/30/2022 11:11:27 -------------------------------------------------------------------------------- HPI Details Patient Name: Date of Service: [name], DO RIS S. 07/30/2022 10:00 A M Medical Record Number: [id number] Patient Account Number: [id number] Date of Birth/Sex: Treating RN: [birth year] (88 y.o. F) Primary Care Provider: [name] Other Clinician: Referring Provider: Treating Provider/Extender: [name] in Treatment: 5 History of Present Illness HPI Description: ADMISSION 06/24/2022 This is a 88 year old woman with fairly advanced dementia, history of stroke, atrial fibrillation, hypertension, chronic anticoagulation, and COPD. She resides in a facility. She is, unfortunately, not accompanied by anyone familiar with her medical history today and so what we are able to obtain is limited to the electronic medical record. She is not diabetic. For an unknown period of time, she has had an ulcer on her left heel. She denies pain. There was a dressing on the wound on intake, but it seems to have been there for quite some time as there was a significant odor present. Once her foot was washed, the odor dissipated. ABI in clinic was  normal at 1.04. There is an oval ulceration on her posterior left calcaneus, there is some granulation tissue present which is a bit friable as well as slough and nonviable subcutaneous tissue. Periwound skin is intact and there is no significant edema. No malodor or purulent drainage. 07/04/2022: The wound is smaller today. There is hypertrophic granulation tissue present as well as a little bit of slough. No concern for infection. Apparently she did get a Prevalon boot, but she takes it off and will not wear it. 07/15/2022: The wound continues to contract. The hypertrophic granulation tissue has resolved. The surface is flush with the surrounding skin. Light slough on the wound surface. 07/30/2022: Her wound has healed. Electronic Signature(s) Signed: 07/30/2022 11:11:45 AM By: Duanne Guess MD FACS Entered By: Duanne Guess on 07/30/2022 11:11:45 [name] ([id number]) [id number] Page 2 of 6 -------------------------------------------------------------------------------- Physical Exam Details Patient Name: Date of Service: [name], DO RIS S. 07/30/2022 10:00 A M Medical Record Number: [id number] Patient Account Number: [id number] Date of Birth/Sex: Treating RN: 1926/06/16 (88 y.o. F) Primary Care Provider: [name] Other Clinician: Referring Provider: Treating Provider/Extender: [name] in Treatment: 5 Constitutional . . . . No acute distress. Respiratory Normal work of breathing on room air. Notes 07/30/2022: Her wound is healed. Electronic Signature(s) Signed: 07/30/2022 11:12:16 AM By: Duanne Guess MD FACS Entered By: Duanne Guess on 07/30/2022 11:12:16 -------------------------------------------------------------------------------- Physician Orders Details Patient Name: Date of Service: [name], DO RIS S. 07/30/2022 10:00 A M Medical Record Number: [id number] Patient Account Number:  [id number] Date of Birth/Sex: Treating RN: 23-Feb-1926 (88 y.o. Fredderick Phenix Primary Care Provider: [name] Other Clinician: Referring Provider: Treating Provider/Extender: [name] in Treatment: 5 Verbal / Phone Orders: No Diagnosis Coding ICD-10 Coding Code Description [phone] Pressure ulcer of left heel, stage 3 J44.9 Chronic obstructive pulmonary disease, unspecified  G30.9 Alzheimer's disease, unspecified Dementia in other diseases classified elsewhere, unspecified severity, without behavioral disturbance, psychotic disturbance, mood F02.80 disturbance, and anxiety E44.0 Moderate protein-calorie malnutrition R29.6 Repeated falls Z79.01 Long term (current) use of anticoagulants I48.0 Paroxysmal atrial fibrillation I95.9 Hypotension, unspecified Discharge From [facility] Services Discharge from Wound Care Center Edema Control - Lymphedema / SCD / Other Avoid standing for long periods of time. Off-Loading Other: - keep pressure off of heels as much as possible Home Health Discontinue home health for wound care. [name], [name] ([id number]) [id number] Page 3 of 6 Other Home Health Orders/Instructions: Frances Furbish Electronic Signature(s) Signed: 07/30/2022 12:30:29 PM By: Duanne Guess MD FACS Entered By: Duanne Guess on 07/30/2022 11:12:25 -------------------------------------------------------------------------------- Problem List Details Patient Name: Date of Service: [name], DO RIS S. 07/30/2022 10:00 A M Medical Record Number: [id number] Patient Account Number: [id number] Date of Birth/Sex: Treating RN: 09/17/1925 (88 y.o. F) Primary Care Provider: [name] Other Clinician: Referring Provider: Treating Provider/Extender: [name] in Treatment: 5 Active Problems ICD-10 Encounter Code Description Active Date MDM Diagnosis [id number] Pressure ulcer of left heel,  stage 3 06/24/2022 No Yes J44.9 Chronic obstructive pulmonary disease, unspecified 06/24/2022 No Yes G30.9 Alzheimer's disease, unspecified 06/24/2022 No Yes F02.80 Dementia in other diseases classified elsewhere, unspecified severity, without 06/24/2022 No Yes behavioral disturbance, psychotic disturbance, mood disturbance, and anxiety E44.0 Moderate protein-calorie malnutrition 06/24/2022 No Yes R29.6 Repeated falls 06/24/2022 No Yes Z79.01 Long term (current) use of anticoagulants 06/24/2022 No Yes I48.0 Paroxysmal atrial fibrillation 06/24/2022 No Yes I95.9 Hypotension, unspecified 06/24/2022 No Yes Inactive Problems Resolved Problems Electronic Signature(s) Signed: 07/30/2022 11:11:14 AM By: Duanne Guess MD FACS Entered By: Duanne Guess on 07/30/2022 11:11:14 [name] ([id number]) [id number] Page 4 of 6 -------------------------------------------------------------------------------- Progress Note Details Patient Name: Date of Service: Katherine Yolanda Manges, DO RIS S. 07/30/2022 10:00 A M Medical Record Number: [id number] Patient Account Number: [id number] Date of Birth/Sex: Treating RN: May 04, 1926 (88 y.o. F) Primary Care Provider: [name] Other Clinician: Referring Provider: Treating Provider/Extender: [name] in Treatment: 5 Subjective Chief Complaint Information obtained from Patient Patient is at the clinic for treatment of an open pressure ulcer History of Present Illness (HPI) ADMISSION 06/24/2022 This is a 88 year old woman with fairly advanced dementia, history of stroke, atrial fibrillation, hypertension, chronic anticoagulation, and COPD. She resides in a facility. She is, unfortunately, not accompanied by anyone familiar with her medical history today and so what we are able to obtain is limited to the electronic medical record. She is not diabetic. For an unknown period of time, she has had an  ulcer on her left heel. She denies pain. There was a dressing on the wound on intake, but it seems to have been there for quite some time as there was a significant odor present. Once her foot was washed, the odor dissipated. ABI in clinic was normal at 1.04. There is an oval ulceration on her posterior left calcaneus, there is some granulation tissue present which is a bit friable as well as slough and nonviable subcutaneous tissue. Periwound skin is intact and there is no significant edema. No malodor or purulent drainage. 07/04/2022: The wound is smaller today. There is hypertrophic granulation tissue present as well as a little bit of slough. No concern for infection. Apparently she did get a Prevalon boot, but she takes it off and will not wear it. 07/15/2022: The wound continues to contract. The hypertrophic granulation tissue has resolved. The surface is flush with the  surrounding skin. Light slough on the wound surface. 07/30/2022: Her wound has healed. Patient History Unable to Obtain Patient History due to Dementia. Medical History Cardiovascular Patient has history of Arrhythmia - a-fib Neurologic Patient has history of Dementia Medical A Surgical History Notes nd [name] dyspahgia Objective Constitutional No acute distress. Vitals Time Taken: 10:06 AM, Height: 67 in, Temperature: 97.8 F, Pulse: 67 bpm, Respiratory Rate: 18 breaths/min, Blood Pressure: 114/71 mmHg. Respiratory Normal work of breathing on room air. General Notes: 07/30/2022: Her wound is healed. Integumentary (Hair, Skin) Wound #1 status is Open. Original cause of wound was Pressure Injury. The date acquired was: 04/01/2022. The wound has been in treatment 5 weeks. The wound is located on the Left Calcaneus. The wound measures 0cm length x 0cm width x 0cm depth; 0cm^2 area and 0cm^3 volume. There is no tunneling or undermining noted. There is a none present amount of drainage noted. The wound  margin is distinct with the outline attached to the wound base. There is no ALLIEN, MELBERG ([id number]) [id number] Page 5 of 6 granulation within the wound bed. There is no necrotic tissue within the wound bed. The periwound skin appearance had no abnormalities noted for texture. The periwound skin appearance had no abnormalities noted for color. The periwound skin appearance exhibited: Dry/Scaly. Periwound temperature was noted as No Abnormality. Assessment Active Problems ICD-10 Pressure ulcer of left heel, stage 3 Chronic obstructive pulmonary disease, unspecified Alzheimer's disease, unspecified Dementia in other diseases classified elsewhere, unspecified severity, without behavioral disturbance, psychotic disturbance, mood disturbance, and anxiety Moderate protein-calorie malnutrition Repeated falls Long term (current) use of anticoagulants Paroxysmal atrial fibrillation Hypotension, unspecified Plan Discharge From [facility] Services: Discharge from Wound Care Center Edema Control - Lymphedema / SCD / Other: Avoid standing for long periods of time. Off-Loading: Other: - keep pressure off of heels as much as possible Home Health: Discontinue home health for wound care. Other Home Health Orders/Instructions: Frances Furbish 07/30/2022: Her wound is healed. I recommended that she continue to take protective measures including padding, floating, and offloading her heels to avoid future tissue damage. We will discharge her from the wound care center. She may follow-up as needed. Electronic Signature(s) Signed: 07/30/2022 11:13:24 AM By: Duanne Guess MD FACS Entered By: Duanne Guess on 07/30/2022 11:13:24 -------------------------------------------------------------------------------- HxROS Details Patient Name: Date of Service: [name], DO RIS S. 07/30/2022 10:00 A M Medical Record Number: [id number] Patient Account Number: [id number] Date of Birth/Sex:  Treating RN: 09-10-1925 (88 y.o. F) Primary Care Provider: [name] Other Clinician: Referring Provider: Treating Provider/Extender: [name] in Treatment: 5 Unable to Obtain Patient History due to Dementia [name] Medical History: Past Medical History Notes: dyspahgia Cardiovascular Medical History: Positive for: Arrhythmia TOMISHA, REPPUCCI ([id number]) [id number] Page 6 of 6 Neurologic Medical History: Positive for: Dementia Immunizations Pneumococcal Vaccine: Received Pneumococcal Vaccination: No Implantable Devices No devices added Electronic Signature(s) Signed: 07/30/2022 12:30:29 PM By: Duanne Guess MD FACS Entered By: Duanne Guess on 07/30/2022 11:11:51 -------------------------------------------------------------------------------- SuperBill Details Patient Name: Date of Service: [name], DO RIS S. 07/30/2022 Medical Record Number: [id number] Patient Account Number: [id number] Date of Birth/Sex: Treating RN: [birth year] (88 y.o. F) Primary Care Provider: [name] Other Clinician: Referring Provider: Treating Provider/Extender: [name] in Treatment: 5 Diagnosis Coding ICD-10 Codes Code Description [phone] Pressure ulcer of left heel, stage 3 J44.9 Chronic obstructive pulmonary disease, unspecified G30.9 Alzheimer's disease, unspecified Dementia in other diseases classified elsewhere, unspecified severity, without behavioral disturbance, psychotic  disturbance, mood F02.80 disturbance, and anxiety E44.0 Moderate protein-calorie malnutrition R29.6 Repeated falls Z79.01 Long term (current) use of anticoagulants I48.0 Paroxysmal atrial fibrillation I95.9 Hypotension, unspecified Facility Procedures : CPT4 Code: [id number] Description: [id number] - WOUND CARE VISIT-LEV 2 EST PT Modifier: Quantity: 1 Physician Procedures : CPT4 Code  Description Modifier [id number] - WC PHYS LEVEL 3 - EST PT ICD-10 Diagnosis Description L89.623 Pressure ulcer of left heel, stage 3 J44.9 Chronic obstructive pulmonary disease, unspecified G30.9 Alzheimer's disease, unspecified E44.0  Moderate protein-calorie malnutrition Quantity: 1 Electronic Signature(s) Signed: 07/30/2022 12:30:29 PM By: Duanne Guessannon, Gyasi Hazzard MD FACS Signed: 07/30/2022 4:37:16 PM By: Samuella BruinHerrington, Taylor Previous Signature: 07/30/2022 11:14:31 AM Version By: Duanne Guessannon, Charda Janis MD FACS Entered By: Samuella BruinHerrington, Taylor on 07/30/2022 11:19:15

## 2022-07-30 NOTE — Progress Notes (Signed)
[name], [name] ([id number]) [id number] Page 1 of 7 Visit Report for 07/30/2022 Arrival Information Details Patient Name: Date of Service: Katherine Martes, DO RIS S. 07/30/2022 10:00 A M Medical Record Number: [id number] Patient Account Number: [id number] Date of Birth/Sex: Treating RN: 10-03-1925 (88 y.o. [name] Primary Care [name]: [name] Other Clinician: Referring [name]: Treating [name]/Extender: [name] in Treatment: 5 Visit Information History Since Last Visit Added or deleted any medications: No Patient Arrived: [name] Any new allergies or adverse reactions: No Arrival Time: 10:05 Had a fall or experienced change in No Accompanied By: daughter activities of daily living that may affect Transfer Assistance: None risk of falls: Patient Identification Verified: Yes Signs or symptoms of abuse/neglect since last visito No Secondary Verification Process Completed: Yes Hospitalized since last visit: No Patient Requires Transmission-Based Precautions: No Implantable device outside of the clinic excluding No Patient Has Alerts: No cellular tissue based products placed in the center since last visit: Has Dressing in Place as Prescribed: Yes Pain Present Now: No Electronic Signature(s) Signed: 07/30/2022 4:37:16 PM By: [name] Entered By: [name] on 07/30/2022 10:06:00 -------------------------------------------------------------------------------- Clinic Level of Care Assessment Details Patient Name: Date of Service: Katherine Martes, DO RIS S. 07/30/2022 10:00 A M Medical Record Number: [id number] Patient Account Number: [id number] Date of Birth/Sex: Treating RN: 12-16-1925 (88 y.o. [name] Primary Care [name]: [name] Other Clinician: Referring [name]: Treating [name]/Extender: [name] in Treatment: 5 Clinic Level of Care  Assessment Items TOOL 4 Quantity Score X- 1 0 Use when only an EandM is performed on FOLLOW-UP visit ASSESSMENTS - Nursing Assessment / Reassessment X- 1 10 Reassessment of Co-morbidities (includes updates in patient status) X- 1 5 Reassessment of Adherence to Treatment Plan ASSESSMENTS - Wound and Skin A ssessment / Reassessment X - Simple Wound Assessment / Reassessment - one wound 1 5 []  - 0 Complex Wound Assessment / Reassessment - multiple wounds []  - 0 Dermatologic / Skin Assessment (not related to wound area) ASSESSMENTS - Focused Assessment []  - 0 Circumferential Edema Measurements - multi extremities []  - 0 Nutritional Assessment / Counseling / Intervention [name], [name] ([id number]) [id number] Page 2 of 7 []  - 0 Lower Extremity Assessment (monofilament, tuning fork, pulses) []  - 0 Peripheral Arterial Disease Assessment (using hand held doppler) ASSESSMENTS - Ostomy and/or Continence Assessment and Care []  - 0 Incontinence Assessment and Management []  - 0 Ostomy Care Assessment and Management (repouching, etc.) PROCESS - Coordination of Care X - Simple Patient / Family Education for ongoing care 1 15 []  - 0 Complex (extensive) Patient / Family Education for ongoing care X- 1 10 Staff obtains Programmer, systems, Records, T Results / Process Orders est []  - 0 Staff telephones HHA, Nursing Homes / Clarify orders / etc []  - 0 Routine Transfer to another Facility (non-emergent condition) []  - 0 Routine Hospital Admission (non-emergent condition) []  - 0 New Admissions / Biomedical engineer / Ordering NPWT Apligraf, etc. , []  - 0 Emergency Hospital Admission (emergent condition) X- 1 10 Simple Discharge Coordination []  - 0 Complex (extensive) Discharge Coordination PROCESS - Special Needs []  - 0 Pediatric / Minor Patient Management []  - 0 Isolation Patient Management []  - 0 Hearing / Language / Visual special needs []  -  0 Assessment of Community assistance (transportation, D/C planning, etc.) []  - 0 Additional assistance / Altered mentation []  - 0 Support Surface(s) Assessment (bed, cushion, seat, etc.) INTERVENTIONS - Wound Cleansing / Measurement []  - 0  Simple Wound Cleansing - one wound []  - 0 Complex Wound Cleansing - multiple wounds X- 1 5 Wound Imaging (photographs - any number of wounds) []  - 0 Wound Tracing (instead of photographs) []  - 0 Simple Wound Measurement - one wound []  - 0 Complex Wound Measurement - multiple wounds INTERVENTIONS - Wound Dressings []  - 0 Small Wound Dressing one or multiple wounds []  - 0 Medium Wound Dressing one or multiple wounds []  - 0 Large Wound Dressing one or multiple wounds []  - 0 Application of Medications - topical []  - 0 Application of Medications - injection INTERVENTIONS - Miscellaneous []  - 0 External ear exam []  - 0 Specimen Collection (cultures, biopsies, blood, body fluids, etc.) []  - 0 Specimen(s) / Culture(s) sent or taken to Lab for analysis []  - 0 Patient Transfer (multiple staff / Civil Service fast streamer / Similar devices) []  - 0 Simple Staple / Suture removal (25 or less) []  - 0 Complex Staple / Suture removal (26 or more) []  - 0 Hypo / Hyperglycemic Management (close monitor of Blood Glucose) [name], [name] ([id number]) [id number] Page 3 of 7 []  - 0 Ankle / Brachial Index (ABI) - do not check if billed separately X- 1 5 Vital Signs Has the patient been seen at the hospital within the last three years: Yes Total Score: 65 Level Of Care: New/Established - Level 2 Electronic Signature(s) Signed: 07/30/2022 4:37:16 PM By: [name] Entered By: [name] on 07/30/2022 11:19:08 -------------------------------------------------------------------------------- Encounter Discharge Information Details Patient Name: Date of Service: Katherine Martes, DO RIS S. 07/30/2022 10:00 [place] Record Number:  [id number] Patient Account Number: [id number] Date of Birth/Sex: Treating RN: October 27, 1925 (88 y.o. [name] Primary Care Bryanne Riquelme: [name] Other Clinician: Referring Mariabella Nilsen: Treating Nevan Creighton/Extender: [name] in Treatment: 5 Encounter Discharge Information Items Discharge Condition: Stable Ambulatory Status: Walker Discharge Destination: Home Transportation: Private Auto Accompanied By: daughter Schedule Follow-up Appointment: Yes Clinical Summary of Care: Patient Declined Electronic Signature(s) Signed: 07/30/2022 4:37:16 PM By: Sabas Sous By: [name] on 07/30/2022 11:19:45 -------------------------------------------------------------------------------- Lower Extremity Assessment Details Patient Name: Date of Service: Katherine Martes, DO RIS S. 07/30/2022 10:00 A M Medical Record Number: [id number] Patient Account Number: [id number] Date of Birth/Sex: Treating RN: 12-06-1925 (88 y.o. [name] Primary Care Viaan Knippenberg: [name] Other Clinician: Referring Deseri Loss: Treating Willeen Novak/Extender: Cipriano Bunker Weeks in Treatment: 5 Edema Assessment Assessed: [Left: No] [Right: No] Edema: [Left: N] [Right: o] Calf Left: Right: Point of Measurement: From Medial Instep 28.5 cm Ankle Left: Right: Point of Measurement: From Medial Instep 18.5 cm Electronic Signature(s) Signed: 07/30/2022 4:37:16 PM By: Casandra Doffing ([id number]) PM By: Franz Dell.pdf Page 4 of 7 Signed: 07/30/2022 4:37:16 aylor Entered By: [name] on 07/30/2022 10:06:25 -------------------------------------------------------------------------------- Multi Wound Chart Details Patient Name: Date of Service: Katherine Martes, DO RIS S. 07/30/2022 10:00 A M Medical Record Number: [id number] Patient Account Number: [id number] Date of Birth/Sex:  Treating RN: December 17, 1925 (88 y.o. F) Primary Care Annelise Mccoy: [name] Other Clinician: Referring Calyb Mcquarrie: Treating Karesa Maultsby/Extender: Cipriano Bunker Weeks in Treatment: 5 Vital Signs Height(in): 48 Pulse(bpm): 14 Weight(lbs): Blood Pressure(mmHg): 114/71 Body Mass Index(BMI): Temperature(F): 97.8 Respiratory Rate(breaths/min): 18 [1:Photos:] [N/A:N/A] Left Calcaneus N/A N/A Wound Location: Pressure Injury N/A N/A Wounding Event: Pressure Ulcer N/A N/A Primary Etiology: Arrhythmia, Dementia N/A N/A Comorbid History: 04/01/2022 N/A N/A Date Acquired: 5 N/A N/A Weeks of Treatment: Open N/A N/A Wound Status: No N/A N/A Wound Recurrence: 0x0x0  N/A N/A Measurements L x W x D (cm) 0 N/A N/A A (cm) : rea 0 N/A N/A Volume (cm) : 100.00% N/A N/A % Reduction in A rea: 100.00% N/A N/A % Reduction in Volume: Category/Stage III N/A N/A Classification: None Present N/A N/A Exudate A mount: Distinct, outline attached N/A N/A Wound Margin: None Present (0%) N/A N/A Granulation A mount: None Present (0%) N/A N/A Necrotic A mount: Fascia: No N/A N/A Exposed Structures: Fat Layer (Subcutaneous Tissue): No Tendon: No Muscle: No Joint: No Bone: No Large (67-100%) N/A N/A Epithelialization: No Abnormalities Noted N/A N/A Periwound Skin Texture: Dry/Scaly: Yes N/A N/A Periwound Skin Moisture: No Abnormalities Noted N/A N/A Periwound Skin Color: No Abnormality N/A N/A Temperature: Treatment Notes Electronic Signature(s) Signed: 07/30/2022 11:11:21 AM By: Duanne Guess MD FACS Entered By: Duanne Guess on 07/30/2022 11:11:21 Clyda Hurdle ([id number]) [id number] Page 5 of 7 -------------------------------------------------------------------------------- Multi-Disciplinary Care Plan Details Patient Name: Date of Service: Katherine Clement, DO RIS S. 07/30/2022 10:00 A M Medical Record Number: [id number] Patient  Account Number: [id number] Date of Birth/Sex: Treating RN: Jan 04, 1926 (88 y.o. Fredderick Phenix Primary Care Depaul Arizpe: [name] Other Clinician: Referring Rosy Estabrook: Treating Shermaine Brigham/Extender: [name] in Treatment: 5 Active Inactive Electronic Signature(s) Signed: 07/30/2022 4:37:16 PM By: Gelene Mink By: Samuella Bruin on 07/30/2022 11:19:56 -------------------------------------------------------------------------------- Pain Assessment Details Patient Name: Date of Service: Katherine Clement, DO RIS S. 07/30/2022 10:00 A M Medical Record Number: [id number] Patient Account Number: [id number] Date of Birth/Sex: Treating RN: Aug 26, 1926 (88 y.o. Fredderick Phenix Primary Care Dickson Kostelnik: [name] Other Clinician: Referring Caitrin Pendergraph: Treating Hadyn Blanck/Extender: [name] in Treatment: 5 Active Problems Location of Pain Severity and Description of Pain Patient Has Paino No Site Locations Rate the pain. Current Pain Level: 0 Pain Management and Medication Current Pain Management: Electronic Signature(s) Signed: 07/30/2022 4:37:16 PM By: Samuella Bruin Entered By: Samuella Bruin on 07/30/2022 10:06:21 Clyda Hurdle ([id number]) [id number] Page 6 of 7 -------------------------------------------------------------------------------- Patient/Caregiver Education Details Patient Name: Date of Service: Katherine Clement, DO RIS S. 11/29/2023andnbsp10:00 A M Medical Record Number: [id number] Patient Account Number: [id number] Date of Birth/Gender: Treating RN: November 12, 1925 (88 y.o. Fredderick Phenix Primary Care Physician: [name] Other Clinician: Referring Physician: Treating Physician/Extender: [name] in Treatment: 5 Education Assessment Education Provided To: Patient Education Topics Provided Wound/Skin  Impairment: Methods: Explain/Verbal Responses: Reinforcements needed, State content correctly Electronic Signature(s) Signed: 07/30/2022 4:37:16 PM By: Samuella Bruin Entered By: Samuella Bruin on 07/30/2022 10:12:14 -------------------------------------------------------------------------------- Wound Assessment Details Patient Name: Date of Service: Katherine Clement, DO RIS S. 07/30/2022 10:00 A M Medical Record Number: [id number] Patient Account Number: [id number] Date of Birth/Sex: Treating RN: 29-Nov-1925 (88 y.o. Fredderick Phenix Primary Care Shayma Pfefferle: [name] Other Clinician: Referring Serina Nichter: Treating Antonie Borjon/Extender: [name] in Treatment: 5 Wound Status Wound Number: 1 Primary Etiology: Pressure Ulcer Wound Location: Left Calcaneus Wound Status: Open Wounding Event: Pressure Injury Comorbid History: Arrhythmia, Dementia Date Acquired: 04/01/2022 Weeks Of Treatment: 5 Clustered Wound: No Photos Wound Measurements Length: (cm) Width: (cm) [name], [name] ([id number]) Depth: (cm) Area: (cm) Volume: (cm) 0 % Reduction in Area: 100% 0 % Reduction in Volume: 100% [id number] Page 7 of 7 0 Epithelialization: Large (67-100%) 0 Tunneling: No 0 Undermining: No Wound Description Classification: Category/Stage III Wound Margin: Distinct, outline attached Exudate Amount: None Present Foul Odor After Cleansing: No Slough/Fibrino No Wound Bed Granulation Amount: None Present (0%) Exposed Structure Necrotic Amount: None Present (0%)  Fascia Exposed: No Fat Layer (Subcutaneous Tissue) Exposed: No Tendon Exposed: No Muscle Exposed: No Joint Exposed: No Bone Exposed: No Periwound Skin Texture Texture Color No Abnormalities Noted: Yes No Abnormalities Noted: Yes Moisture Temperature / Pain No Abnormalities Noted: No Temperature: No Abnormality Dry / Scaly: Yes Electronic Signature(s) Signed:  07/30/2022 4:37:16 PM By: [name] Entered By: [name] on 07/30/2022 11:06:25 -------------------------------------------------------------------------------- Vitals Details Patient Name: Date of Service: Katherine Martes, DO RIS S. 07/30/2022 10:00 A M Medical Record Number: [id number] Patient Account Number: [id number] Date of Birth/Sex: Treating RN: 10-17-1925 (88 y.o. [name] Primary Care Calvert Charland: [name] Other Clinician: Referring Dayvin Aber: Treating Hazem Kenner/Extender: Cipriano Bunker Weeks in Treatment: 5 Vital Signs Time Taken: 10:06 Temperature (F): 97.8 Height (in): 67 Pulse (bpm): 67 Respiratory Rate (breaths/min): 18 Blood Pressure (mmHg): 114/71 Reference Range: 80 - 120 mg / dl Electronic Signature(s) Signed: 07/30/2022 4:37:16 PM By: [name] Entered By: [name] on 07/30/2022 10:06:16

## 2022-08-01 ENCOUNTER — Encounter: Payer: Self-pay | Admitting: Nurse Practitioner

## 2022-08-01 ENCOUNTER — Telehealth: Payer: Self-pay

## 2022-08-01 NOTE — Telephone Encounter (Signed)
Letter was faxed over for monthly vitals and weight plus shakes in between meals.

## 2022-08-01 NOTE — Telephone Encounter (Signed)
Verbal order given for vitals and weight check and shake between meal for [name].  Verbal was given to [facility], and she will fax oover a copy for Dr. Marina Goodell to sign.

## 2022-08-04 DIAGNOSIS — G309 Alzheimer's disease, unspecified: Secondary | ICD-10-CM | POA: Diagnosis not present

## 2022-08-04 DIAGNOSIS — D539 Nutritional anemia, unspecified: Secondary | ICD-10-CM | POA: Diagnosis not present

## 2022-08-04 DIAGNOSIS — H9190 Unspecified hearing loss, unspecified ear: Secondary | ICD-10-CM | POA: Diagnosis not present

## 2022-08-04 DIAGNOSIS — F0283 Dementia in other diseases classified elsewhere, unspecified severity, with mood disturbance: Secondary | ICD-10-CM | POA: Diagnosis not present

## 2022-08-04 DIAGNOSIS — I4891 Unspecified atrial fibrillation: Secondary | ICD-10-CM | POA: Diagnosis not present

## 2022-08-04 DIAGNOSIS — F02818 Dementia in other diseases classified elsewhere, unspecified severity, with other behavioral disturbance: Secondary | ICD-10-CM | POA: Diagnosis not present

## 2022-08-04 DIAGNOSIS — Z8673 Personal history of transient ischemic attack (TIA), and cerebral infarction without residual deficits: Secondary | ICD-10-CM | POA: Diagnosis not present

## 2022-08-04 DIAGNOSIS — E43 Unspecified severe protein-calorie malnutrition: Secondary | ICD-10-CM | POA: Diagnosis not present

## 2022-08-04 DIAGNOSIS — Z9181 History of falling: Secondary | ICD-10-CM | POA: Diagnosis not present

## 2022-08-04 DIAGNOSIS — M80052D Age-related osteoporosis with current pathological fracture, left femur, subsequent encounter for fracture with routine healing: Secondary | ICD-10-CM | POA: Diagnosis not present

## 2022-08-04 DIAGNOSIS — I10 Essential (primary) hypertension: Secondary | ICD-10-CM | POA: Diagnosis not present

## 2022-08-04 DIAGNOSIS — Z96642 Presence of left artificial hip joint: Secondary | ICD-10-CM | POA: Diagnosis not present

## 2022-08-04 DIAGNOSIS — F02811 Dementia in other diseases classified elsewhere, unspecified severity, with agitation: Secondary | ICD-10-CM | POA: Diagnosis not present

## 2022-08-04 DIAGNOSIS — K219 Gastro-esophageal reflux disease without esophagitis: Secondary | ICD-10-CM | POA: Diagnosis not present

## 2022-08-04 DIAGNOSIS — F32A Depression, unspecified: Secondary | ICD-10-CM | POA: Diagnosis not present

## 2022-08-04 DIAGNOSIS — F411 Generalized anxiety disorder: Secondary | ICD-10-CM | POA: Diagnosis not present

## 2022-08-04 DIAGNOSIS — Z7901 Long term (current) use of anticoagulants: Secondary | ICD-10-CM | POA: Diagnosis not present

## 2022-08-04 DIAGNOSIS — L89623 Pressure ulcer of left heel, stage 3: Secondary | ICD-10-CM | POA: Diagnosis not present

## 2022-08-04 DIAGNOSIS — F0284 Dementia in other diseases classified elsewhere, unspecified severity, with anxiety: Secondary | ICD-10-CM | POA: Diagnosis not present

## 2022-08-04 DIAGNOSIS — F05 Delirium due to known physiological condition: Secondary | ICD-10-CM | POA: Diagnosis not present

## 2022-08-07 DIAGNOSIS — S63115A Dislocation of metacarpophalangeal joint of left thumb, initial encounter: Secondary | ICD-10-CM | POA: Diagnosis not present

## 2022-08-18 DIAGNOSIS — Z7901 Long term (current) use of anticoagulants: Secondary | ICD-10-CM | POA: Insufficient documentation

## 2022-08-19 ENCOUNTER — Telehealth: Payer: Self-pay | Admitting: Cardiology

## 2022-08-19 ENCOUNTER — Ambulatory Visit: Payer: PPO | Attending: Cardiology | Admitting: Cardiology

## 2022-08-19 ENCOUNTER — Encounter: Payer: Self-pay | Admitting: Cardiology

## 2022-08-19 VITALS — BP 85/60 | HR 108 | Ht 62.0 in | Wt 93.0 lb

## 2022-08-19 DIAGNOSIS — Z7901 Long term (current) use of anticoagulants: Secondary | ICD-10-CM | POA: Diagnosis not present

## 2022-08-19 DIAGNOSIS — I480 Paroxysmal atrial fibrillation: Secondary | ICD-10-CM | POA: Diagnosis not present

## 2022-08-19 DIAGNOSIS — S72142D Displaced intertrochanteric fracture of left femur, subsequent encounter for closed fracture with routine healing: Secondary | ICD-10-CM | POA: Diagnosis not present

## 2022-08-19 DIAGNOSIS — E7800 Pure hypercholesterolemia, unspecified: Secondary | ICD-10-CM

## 2022-08-19 DIAGNOSIS — I4719 Other supraventricular tachycardia: Secondary | ICD-10-CM | POA: Diagnosis not present

## 2022-08-19 DIAGNOSIS — G903 Multi-system degeneration of the autonomic nervous system: Secondary | ICD-10-CM

## 2022-08-19 DIAGNOSIS — R296 Repeated falls: Secondary | ICD-10-CM | POA: Diagnosis not present

## 2022-08-19 MED ORDER — MIDODRINE HCL 5 MG PO TABS: 5.0000 mg | ORAL_TABLET | Freq: Three times a day (TID) | ORAL | 6 refills | Status: DC

## 2022-08-19 NOTE — Telephone Encounter (Signed)
Returned called to [facility] , was told [name] had left for the day. I asked if I could speak to someone else and then was placed on hold for 10 min. I will return call tomorrow

## 2022-08-19 NOTE — Progress Notes (Addendum)
Cardiology Office Note:    We have had a multitude of communications to our office the patient and daughter request no change of midodrine into this we see 3 weeks of blood pressure recordings I hope this satisfies your requirement this is the current plan no change in midodrine pending results   Date:  08/19/2022   ID:  [name], DOB [birth year], MRN [id number]  PCP:  Janie Morning, NP  Cardiologist:  Norman Herrlich, MD    Referring MD: Janie Morning, NP    ASSESSMENT:    1. Neurogenic orthostatic hypotension (HCC)   2. Paroxysmal atrial fibrillation (HCC)   3. PAT (paroxysmal atrial tachycardia)   4. Current use of long term anticoagulation   5. Pure hypercholesterolemia   6. Falls frequently    PLAN:    In order of problems listed above:  She appears to be losing the effect of midodrine will increase to 3 times a day to support her blood pressure and try to avoid trauma and injury. Will continue minimal dose of beta-blocker that is been effective for what had previously been almost incessant atrial arrhythmia and reduced dose anticoagulant I advised they agree we will stop a statin I just do not see any benefit in this part of her life and current health care goals   Next appointment: 6 months   Medication Adjustments/Labs and Tests Ordered: Current medicines are reviewed at length with the patient today.  Concerns regarding medicines are outlined above.  No orders of the defined types were placed in this encounter.  No orders of the defined types were placed in this encounter.   Follow-up for hypotension and atrial arrhythmia   History of Present Illness:    [name] is a 88 y.o. female with a hx of neurogenic orthostatic hypotension paroxysmal atrial fibrillation and atrial tachycardia long-term anticoagulation with a history of stroke and hyperlipidemia last seen 12/18/2021.  Compliance with diet, lifestyle and medications: Yes  Per  daughter very loving and devoted to her mother who is presently in memory care She continues to be unsteady and has fallen and she has a cast on the left hand from a fracture of the  thumb. Has not had syncope palpitation chest pain shortness of breath and bleeding from her anticoagulant.  Recent labs 03/27/2022 hemoglobin 11.5 creatinine 0.86 potassium 4.8  Past Medical History:  Diagnosis Date   Acute ischemic stroke 11/08/2018   left postcentral gyri; left frontal   Adjustment disorder with depressed mood 10/16/2019   Alteration consciousness 03/08/2020   Apraxia following cerebrovascular accident 10/16/2019   B12 deficiency 10/16/2019   Bilateral impacted cerumen 04/02/2021   Body mass index (BMI) less than 19 02/16/2020   Chronic kidney disease, stage II (mild) 12/06/2019   Closed fracture of one rib of right side with routine healing 06/30/2017   Confusion    COPD (chronic obstructive pulmonary disease) 11/16/2017   Current use of long term anticoagulation 07/21/2017   Essential hypertension, benign 10/16/2019   First degree atrioventricular block 06/26/2017   Gait abnormality 09/24/2020   Hammer toe 06/26/2017   Hearing loss    Hyperlipidemia 06/26/2017   Laceration of right lower leg 06/18/2020   Major neurocognitive disorder due to Alzheimer's disease 08/16/2021   Malnutrition of moderate degree 12/06/2019   Memory loss    Other specified cardiac arrhythmias 06/26/2017   Pain in limb 06/26/2017   Palpitations 06/26/2017   Paroxysmal atrial fibrillation 06/26/2017   remote history post  operatively, CHADS 2 score=2   Premature atrial complex 06/26/2017   Premature ventricular contractions 06/26/2017   PSVT (paroxysmal supraventricular tachycardia) 11/10/2017   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   acetaminophen (TYLENOL) 325 MG tablet Take 325 mg by mouth daily.   atorvastatin (LIPITOR) 40 MG tablet TAKE 1 TABLET(40 MG) BY MOUTH DAILY   CALCIUM PO Take 250 mg by mouth daily.   Cholecalciferol (VITAMIN D3) 1000 units CAPS Take 1,000 Units by mouth daily.   divalproex (DEPAKOTE ER) 500 MG 24 hr tablet TAKE 1 TABLET(500 MG) BY MOUTH AT BEDTIME   divalproex (DEPAKOTE) 125 MG DR tablet TAKE 1 TABLET BY MOUTH EVERY MORNING   docusate sodium (COLACE) 100 MG capsule Take 100 mg by mouth 2 (two) times daily.   ELIQUIS 2.5 MG TABS tablet TAKE 1 TABLET(2.5 MG) BY MOUTH TWICE DAILY   midodrine (PROAMATINE) 5 MG tablet Take 1 tablet (5 mg total) by mouth 2 (two) times daily.   mirtazapine (REMERON) 30 MG tablet TAKE 1 TABLET(30 MG) BY MOUTH AT BEDTIME   pantoprazole (PROTONIX) 40 MG tablet Take 40 mg by mouth daily.   polyethylene glycol (MIRALAX / GLYCOLAX) 17 g packet Take 17 g by mouth daily.   senna-docusate (SENOKOT-S) 8.6-50 MG tablet Take 1 tablet by mouth daily.   vitamin B-12 (CYANOCOBALAMIN) 500 MCG tablet Take 500 mcg by mouth daily.      Allergies:   Codeine, Penicillins, and Sulfa antibiotics   Social History   Socioeconomic History   Marital status: Widowed    Spouse name: Not on file   Number of children: 2   Years of education: 12   Highest education level: High school graduate  Occupational History   Occupation: Retired  Tobacco Use   Smoking status: Never    Passive exposure: Past   Smokeless tobacco: Never  Vaping Use   Vaping Use: Never used  Substance and Sexual Activity   Alcohol use: No   Drug use: No   Sexual activity: Not Currently  Other Topics Concern   Not on file  Social History Narrative   Mrs. Badders is widowed. Her  daughter, [name], is her POA and lives 12 miles away. She resides in her own home, alone for last 7 years. Since her CVA she has had someone with her most of the time. She has a hired caregiver or a family member stays most of the day. She feel safe in her home and in her neighborhood.   Right-handed.   0.5 cups caffeine per day.   One story home   Social Determinants of Health   Financial Resource Strain: Low Risk  (01/10/2019)   Overall Financial Resource Strain (CARDIA)    Difficulty of Paying Living Expenses: Not hard at all  Food Insecurity: No Food Insecurity (01/10/2019)   Hunger Vital Sign    Worried About Running Out of Food in the Last Year: Never true    Ran Out of Food in the Last Year: Never true  Transportation Needs: No Transportation Needs (01/10/2019)   PRAPARE -  Administrator, Civil Service (Medical): No    Lack of Transportation (Non-Medical): No  Physical Activity: Inactive (01/10/2019)   Exercise Vital Sign    Days of Exercise per Week: 0 days    Minutes of Exercise per Session: 0 min  Stress: No Stress Concern Present (01/10/2019)   [employer] of Occupational Health - Occupational Stress Questionnaire    Feeling of Stress : Not at all  Social Connections: Somewhat Isolated (01/10/2019)   Social Connection and Isolation Panel [NHANES]    Frequency of Communication with Friends and Family: More than three times a week    Frequency of Social Gatherings with Friends and Family: More than three times a week    Attends Religious Services: More than 4 times per year    Active Member of [employer] or Organizations: No    Attends Banker Meetings: Never    Marital Status: Widowed     Family History: The patient's family history includes Alzheimer's disease in her sister; Bone cancer in her mother; Breast cancer in her father and sister; Dementia in her brother and sister; Diabetes in her father; Heart disease in her father. ROS:   Please  see the history of present illness.    All other systems reviewed and are negative.  EKGs/Labs/Other Studies Reviewed:    The following studies were reviewed today:   Recent Labs: 03/27/2022: ALT 10; BUN 19; Creatinine, Ser 0.86; Hemoglobin 11.5; Platelets 197; Potassium 4.8; Sodium 140  Recent Lipid Panel    Component Value Date/Time   CHOL 112 03/18/2021 1045   TRIG 78 03/18/2021 1045   HDL 46 03/18/2021 1045   CHOLHDL 2.4 03/18/2021 1045   CHOLHDL 4.3 11/09/2018 0512   VLDL 11 11/09/2018 0512   LDLCALC 50 03/18/2021 1045    Physical Exam:    VS:  BP (!) 85/60 (BP Location: Right Arm, Patient Position: Sitting, Cuff Size: Small)   Pulse (!) 108   Ht 5\' 2"  (1.575 m)   Wt 93 lb (42.2 kg)   LMP  (LMP Unknown)   SpO2 97%   BMI 17.01 kg/m     Wt Readings from Last 3 Encounters:  08/19/22 93 lb (42.2 kg)  07/17/22 93 lb 9.6 oz (42.5 kg)  07/02/22 95 lb 6.4 oz (43.3 kg)     GEN: Very frail poor muscle mass.  Passive and withdrawn during the evaluation well nourished, well developed in no acute distress HEENT: Normal NECK: No JVD; No carotid bruits LYMPHATICS: No lymphadenopathy CARDIAC: RRR, no murmurs, rubs, gallops RESPIRATORY:  Clear to auscultation without rales, wheezing or rhonchi  ABDOMEN: Soft, non-tender, non-distended MUSCULOSKELETAL:  No edema; No deformity  SKIN: Warm and dry NEUROLOGIC:  Alert and oriented x 3 PSYCHIATRIC:  Normal affect    Signed, 13/01/23, MD  08/19/2022 2:18 PM    [facility]

## 2022-08-19 NOTE — Telephone Encounter (Signed)
Pt RCC, [name], at [facility] calling for clarification on pt's instructions following her appt today "Check and record BP's at home a few times per week and send in 3 weeks"   She wants to know how many times is a few times and is she supposed to take for three weeks.. please advise.

## 2022-08-19 NOTE — Addendum Note (Signed)
Addended by: Roosvelt Harps R on: 08/19/2022 02:29 PM   Modules accepted: Orders

## 2022-08-19 NOTE — Patient Instructions (Signed)
Medication Instructions:  Your physician has recommended you make the following change in your medication:  Increase Midodrine 5 mg to 3 times daily Stop Lipitor  *If you need a refill on your cardiac medications before your next appointment, please call your pharmacy*   Lab Work: NONE If you have labs (blood work) drawn today and your tests are completely normal, you will receive your results only by: MyChart Message (if you have MyChart) OR A paper copy in the mail If you have any lab test that is abnormal or we need to change your treatment, we will call you to review the results.   Testing/Procedures: NONE   Follow-Up: At [facility], you and your health needs are our priority.  As part of our continuing mission to provide you with exceptional heart care, we have created designated Provider Care Teams.  These Care Teams include your primary Cardiologist (physician) and Advanced Practice Providers (APPs -  Physician Assistants and Nurse Practitioners) who all work together to provide you with the care you need, when you need it.  We recommend signing up for the patient portal called "MyChart".  Sign up information is provided on this After Visit Summary.  MyChart is used to connect with patients for Virtual Visits (Telemedicine).  Patients are able to view lab/test results, encounter notes, upcoming appointments, etc.  Non-urgent messages can be sent to your provider as well.   To learn more about what you can do with MyChart, go to [email or web address].    Your next appointment:   6 month(s)  The format for your next appointment:   In Person  Provider:   Norman Herrlich, MD    Other Instructions Check and record BP's at home a few times per week and send in 3 weeks  Important Information About Sugar

## 2022-08-20 ENCOUNTER — Ambulatory Visit: Payer: PPO | Admitting: Cardiology

## 2022-08-20 ENCOUNTER — Encounter: Payer: Self-pay | Admitting: Cardiology

## 2022-08-20 NOTE — Telephone Encounter (Signed)
Spoke with Micah at [facility] -  instructions per Dr Dulce Sellar to check patients blood pressure once daily for 3 weeks and to fax readings for Dr Thersa Salt review at [phone] [facility] voices understanding and agrees

## 2022-08-21 ENCOUNTER — Encounter: Payer: Self-pay | Admitting: Family Medicine

## 2022-08-27 NOTE — Telephone Encounter (Signed)
I spoke with janelle at [facility] in [place], and yes they can use the PRN order for tylenol that was sent over last week.

## 2022-09-08 ENCOUNTER — Encounter: Payer: Self-pay | Admitting: Nurse Practitioner

## 2022-09-09 ENCOUNTER — Encounter: Payer: Self-pay | Admitting: Family Medicine

## 2022-09-09 DIAGNOSIS — S63115A Dislocation of metacarpophalangeal joint of left thumb, initial encounter: Secondary | ICD-10-CM | POA: Diagnosis not present

## 2022-09-10 ENCOUNTER — Encounter: Payer: Self-pay | Admitting: Neurology

## 2022-09-10 MED ORDER — DIVALPROEX SODIUM 125 MG PO CSDR: DELAYED_RELEASE_CAPSULE | ORAL | 11 refills | Status: DC

## 2022-09-11 ENCOUNTER — Other Ambulatory Visit: Payer: Self-pay

## 2022-09-11 MED ORDER — DIVALPROEX SODIUM 125 MG PO DR TAB: DELAYED_RELEASE_TABLET | ORAL | 5 refills | Status: DC

## 2022-09-14 DIAGNOSIS — R109 Unspecified abdominal pain: Secondary | ICD-10-CM | POA: Diagnosis not present

## 2022-09-14 DIAGNOSIS — S199XXA Unspecified injury of neck, initial encounter: Secondary | ICD-10-CM | POA: Diagnosis not present

## 2022-09-14 DIAGNOSIS — M47812 Spondylosis without myelopathy or radiculopathy, cervical region: Secondary | ICD-10-CM | POA: Diagnosis not present

## 2022-09-14 DIAGNOSIS — S32511A Fracture of superior rim of right pubis, initial encounter for closed fracture: Secondary | ICD-10-CM | POA: Diagnosis not present

## 2022-09-14 DIAGNOSIS — I6523 Occlusion and stenosis of bilateral carotid arteries: Secondary | ICD-10-CM | POA: Diagnosis not present

## 2022-09-14 DIAGNOSIS — W19XXXA Unspecified fall, initial encounter: Secondary | ICD-10-CM | POA: Diagnosis not present

## 2022-09-14 DIAGNOSIS — M25551 Pain in right hip: Secondary | ICD-10-CM | POA: Diagnosis not present

## 2022-09-14 DIAGNOSIS — M25559 Pain in unspecified hip: Secondary | ICD-10-CM | POA: Diagnosis not present

## 2022-09-14 DIAGNOSIS — I672 Cerebral atherosclerosis: Secondary | ICD-10-CM | POA: Diagnosis not present

## 2022-09-14 DIAGNOSIS — J323 Chronic sphenoidal sinusitis: Secondary | ICD-10-CM | POA: Diagnosis not present

## 2022-09-14 DIAGNOSIS — I700 Atherosclerosis of aorta: Secondary | ICD-10-CM | POA: Diagnosis not present

## 2022-09-14 DIAGNOSIS — G309 Alzheimer's disease, unspecified: Secondary | ICD-10-CM | POA: Diagnosis not present

## 2022-09-14 DIAGNOSIS — F0280 Dementia in other diseases classified elsewhere without behavioral disturbance: Secondary | ICD-10-CM | POA: Diagnosis not present

## 2022-09-14 DIAGNOSIS — M4802 Spinal stenosis, cervical region: Secondary | ICD-10-CM | POA: Diagnosis not present

## 2022-09-14 DIAGNOSIS — M47813 Spondylosis without myelopathy or radiculopathy, cervicothoracic region: Secondary | ICD-10-CM | POA: Diagnosis not present

## 2022-09-14 DIAGNOSIS — S32591A Other specified fracture of right pubis, initial encounter for closed fracture: Secondary | ICD-10-CM | POA: Diagnosis not present

## 2022-09-14 DIAGNOSIS — S0990XA Unspecified injury of head, initial encounter: Secondary | ICD-10-CM | POA: Diagnosis not present

## 2022-09-15 ENCOUNTER — Encounter: Payer: Self-pay | Admitting: Nurse Practitioner

## 2022-09-15 DIAGNOSIS — W19XXXA Unspecified fall, initial encounter: Secondary | ICD-10-CM | POA: Diagnosis not present

## 2022-09-15 DIAGNOSIS — S32511A Fracture of superior rim of right pubis, initial encounter for closed fracture: Secondary | ICD-10-CM | POA: Diagnosis not present

## 2022-09-15 DIAGNOSIS — M25551 Pain in right hip: Secondary | ICD-10-CM | POA: Diagnosis not present

## 2022-09-15 DIAGNOSIS — S32591A Other specified fracture of right pubis, initial encounter for closed fracture: Secondary | ICD-10-CM | POA: Diagnosis not present

## 2022-09-16 ENCOUNTER — Encounter: Payer: Self-pay | Admitting: Family Medicine

## 2022-09-17 ENCOUNTER — Encounter: Payer: Self-pay | Admitting: Family Medicine

## 2022-09-17 ENCOUNTER — Other Ambulatory Visit: Payer: Self-pay | Admitting: Family Medicine

## 2022-09-17 MED ORDER — CIPROFLOXACIN HCL 250 MG PO TABS: 250.0000 mg | ORAL_TABLET | Freq: Two times a day (BID) | ORAL | 0 refills | Status: AC

## 2022-09-25 DIAGNOSIS — S32599A Other specified fracture of unspecified pubis, initial encounter for closed fracture: Secondary | ICD-10-CM | POA: Diagnosis not present

## 2022-09-30 NOTE — Progress Notes (Unsigned)
Subjective:  Patient ID: [name], female    DOB: [birth year]  Age: 89 y.o. MRN: [id number]  Chief Complaints:  History of Present illness:   Current Outpatient Medications on File Prior to Visit  Medication Sig Dispense Refill   acebutolol (SECTRAL) 200 MG capsule Take 1 capsule (200 mg total) by mouth every other day. 45 capsule 3   acetaminophen (TYLENOL) 325 MG tablet Take 325 mg by mouth daily.     CALCIUM PO Take 250 mg by mouth daily.     Cholecalciferol (VITAMIN D3) 1000 units CAPS Take 1,000 Units by mouth daily.     divalproex (DEPAKOTE) 125 MG DR tablet Take 1 tab in the AM, take 4 tabs in the PM 150 tablet 5   docusate sodium (COLACE) 100 MG capsule Take 100 mg by mouth 2 (two) times daily.     ELIQUIS 2.5 MG TABS tablet TAKE 1 TABLET(2.5 MG) BY MOUTH TWICE DAILY 180 tablet 1   midodrine (PROAMATINE) 5 MG tablet Take 1 tablet (5 mg total) by mouth 3 (three) times daily with meals. 90 tablet 6   mirtazapine (REMERON) 30 MG tablet TAKE 1 TABLET(30 MG) BY MOUTH AT BEDTIME 90 tablet 1   pantoprazole (PROTONIX) 40 MG tablet Take 40 mg by mouth daily.     polyethylene glycol (MIRALAX / GLYCOLAX) 17 g packet Take 17 g by mouth daily.     senna-docusate (SENOKOT-S) 8.6-50 MG tablet Take 1 tablet by mouth daily.     vitamin B-12 (CYANOCOBALAMIN) 500 MCG tablet Take 500 mcg by mouth daily.      No current facility-administered medications on file prior to visit.   Past Medical History:  Diagnosis Date   Acute ischemic stroke 11/08/2018   left postcentral gyri; left frontal   Adjustment disorder with depressed mood 10/16/2019   Alteration consciousness 03/08/2020   Apraxia following cerebrovascular accident 10/16/2019   B12 deficiency 10/16/2019   Bilateral impacted cerumen 04/02/2021   Body mass index (BMI) less than 19 02/16/2020   Chronic kidney disease, stage II (mild) 12/06/2019   Closed fracture of one rib of right side with routine healing 06/30/2017   Confusion     COPD (chronic obstructive pulmonary disease) 11/16/2017   Current use of long term anticoagulation 07/21/2017   Essential hypertension, benign 10/16/2019   First degree atrioventricular block 06/26/2017   Gait abnormality 09/24/2020   Hammer toe 06/26/2017   Hearing loss    Hyperlipidemia 06/26/2017   Laceration of right lower leg 06/18/2020   Major neurocognitive disorder due to Alzheimer's disease 08/16/2021   Malnutrition of moderate degree 12/06/2019   Memory loss    Other specified cardiac arrhythmias 06/26/2017   Pain in limb 06/26/2017   Palpitations 06/26/2017   Paroxysmal atrial fibrillation 06/26/2017   remote history post operatively, CHADS 2 score=2   Premature atrial complex 06/26/2017   Premature ventricular contractions 06/26/2017   PSVT (paroxysmal supraventricular tachycardia) 11/10/2017   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 10/16/2019   Thoracic aorta atherosclerosis 05/22/2020   Traumatic pneumothorax 06/30/2017   Past Surgical History:  Procedure Laterality Date   ABDOMINAL HYSTERECTOMY     HIP SURGERY  04/2022   SHOULDER SURGERY     TONSILLECTOMY      Family History  Problem Relation Age of Onset   Bone cancer Mother    Breast cancer Father    Diabetes Father    Heart disease Father    Dementia Sister    Breast cancer Sister  Alzheimer's disease Sister    Dementia Brother    Social History   Socioeconomic History   Marital status: Widowed    Spouse name: Not on file   Number of children: 2   Years of education: 12   Highest education level: High school graduate  Occupational History   Occupation: Retired  Tobacco Use   Smoking status: Never    Passive exposure: Past   Smokeless tobacco: Never  Vaping Use   Vaping Use: Never used  Substance and Sexual Activity   Alcohol use: No   Drug use: No   Sexual activity: Not Currently  Other Topics Concern   Not on file  Social History Narrative   [name] is widowed. Her  daughter, [name], is her POA and lives 12 miles away. She resides in her own home, alone for last 7 years. Since her CVA she has had someone with her most of the time. She has a hired caregiver or a family member stays most of the day. She feel safe in her home and in her neighborhood.   Right-handed.   0.5 cups caffeine per day.   One story home   Social Determinants of Health   Financial Resource Strain: Low Risk  (01/10/2019)   Overall Financial Resource Strain (CARDIA)    Difficulty of Paying Living Expenses: Not hard at all  Food Insecurity: No Food Insecurity (01/10/2019)   Hunger Vital Sign    Worried About Running Out of Food in the Last Year: Never true    Ran Out of Food in the Last Year: Never true  Transportation Needs: No Transportation Needs (01/10/2019)   PRAPARE - Hydrologist (Medical): No    Lack of Transportation (Non-Medical): No  Physical Activity: Inactive (01/10/2019)   Exercise Vital Sign    Days of Exercise per Week: 0 days    Minutes of Exercise per Session: 0 min  Stress: No Stress Concern Present (01/10/2019)   [place]    Feeling of Stress : Not at all  Social Connections: Somewhat Isolated (01/10/2019)   Social Connection and Isolation Panel [NHANES]    Frequency of Communication with Friends and Family: More than three times a week    Frequency of Social Gatherings with Friends and Family: More than three times a week    Attends Religious Services: More than 4 times per year    Active Member of [employer] or Organizations: No    Attends Archivist Meetings: Never    Marital Status: Widowed    Review of Systems   Objective:  LMP  (LMP Unknown)      08/19/2022    2:01 PM 07/17/2022   11:22 AM 07/02/2022    2:34 PM  BP/Weight  Systolic BP 85 546 270  Diastolic BP 60 69 70  Wt. (Lbs) 93 93.6 95.4  BMI 17.01 kg/m2 17.12 kg/m2 17.45 kg/m2     Physical Exam  Diabetic Foot Exam - Simple   No data filed        10/29/2021    1:26 PM 03/28/2021    9:44 AM 09/25/2020   10:25 AM 05/22/2020   10:05 AM 01/10/2019    6:37 PM  Depression screen PHQ 2/9  Decreased Interest 1 0 1 3 0  Down, Depressed, Hopeless 1 0 1 3 0  PHQ - 2 Score 2 0 2 6 0  Altered sleeping 0  0 3  Tired, decreased energy 1  2 3    Change in appetite 3  0 3   Feeling bad or failure about yourself  1  0 3   Trouble concentrating 0   3   Moving slowly or fidgety/restless 0  2 3   Suicidal thoughts 0  0 1   PHQ-9 Score 7  6 25    Difficult doing work/chores   Somewhat difficult Extremely dIfficult        07/18/2021   11:09 AM 10/29/2021    1:26 PM 01/13/2022   11:36 AM 01/21/2022   11:10 AM 07/17/2022   11:26 AM  Fall Risk  Falls in the past year? 0 1 1 1 1   Was there an injury with Fall? 0 1 1 1 1   Fall Risk Category Calculator 0 3 3 3 3   Fall Risk Category (Retired) Low High High High High  (RETIRED) Patient Fall Risk Level Low fall risk High fall risk   High fall risk  Patient at Risk for Falls Due to    History of fall(s);Impaired balance/gait;Impaired vision   Fall risk Follow up     Falls evaluation completed    Lab Results  Component Value Date   WBC 5.4 03/27/2022   HGB 11.5 03/27/2022   HCT 35.5 03/27/2022   PLT 197 03/27/2022   GLUCOSE 78 03/27/2022   CHOL 112 03/18/2021   TRIG 78 03/18/2021   HDL 46 03/18/2021   LDLCALC 50 03/18/2021   ALT 10 03/27/2022   AST 16 03/27/2022   NA 140 03/27/2022   K 4.8 03/27/2022   CL 101 03/27/2022   CREATININE 0.86 03/27/2022   BUN 19 03/27/2022   CO2 27 03/27/2022   TSH 1.460 05/22/2020   HGBA1C 5.3 11/09/2018      Assessment & Plan:   There are no diagnoses linked to this encounter.   Follow-up: No follow-ups on file.  An After Visit Summary was printed and given to the patient.  Neil Crouch, DNP, [place] [phone]

## 2022-10-01 ENCOUNTER — Encounter: Payer: Self-pay | Admitting: Nurse Practitioner

## 2022-10-01 ENCOUNTER — Ambulatory Visit (INDEPENDENT_AMBULATORY_CARE_PROVIDER_SITE_OTHER): Payer: PPO | Admitting: Nurse Practitioner

## 2022-10-01 VITALS — BP 104/58 | HR 84 | Temp 97.7°F | Resp 14 | Ht 62.0 in | Wt 95.0 lb

## 2022-10-01 DIAGNOSIS — I959 Hypotension, unspecified: Secondary | ICD-10-CM | POA: Diagnosis not present

## 2022-10-01 DIAGNOSIS — I10 Essential (primary) hypertension: Secondary | ICD-10-CM | POA: Diagnosis not present

## 2022-10-01 DIAGNOSIS — R269 Unspecified abnormalities of gait and mobility: Secondary | ICD-10-CM

## 2022-10-01 DIAGNOSIS — E538 Deficiency of other specified B group vitamins: Secondary | ICD-10-CM

## 2022-10-01 DIAGNOSIS — E7800 Pure hypercholesterolemia, unspecified: Secondary | ICD-10-CM

## 2022-10-01 DIAGNOSIS — E440 Moderate protein-calorie malnutrition: Secondary | ICD-10-CM

## 2022-10-01 DIAGNOSIS — R300 Dysuria: Secondary | ICD-10-CM | POA: Insufficient documentation

## 2022-10-01 DIAGNOSIS — R413 Other amnesia: Secondary | ICD-10-CM

## 2022-10-01 DIAGNOSIS — I471 Supraventricular tachycardia, unspecified: Secondary | ICD-10-CM | POA: Diagnosis not present

## 2022-10-01 DIAGNOSIS — I639 Cerebral infarction, unspecified: Secondary | ICD-10-CM

## 2022-10-01 NOTE — Assessment & Plan Note (Signed)
Eliquis 2.5 mg twice a day. Following to Cardiology and Neurology

## 2022-10-01 NOTE — Assessment & Plan Note (Signed)
Not on any meds now, trying to wean her off from the meds she does not need at this stage of her life- Daughter agree with the plan

## 2022-10-01 NOTE — Patient Instructions (Signed)
Follow up in 4 months 

## 2022-10-01 NOTE — Assessment & Plan Note (Addendum)
Well controlled- she runs in soft side as a baseline Taking Acebutolol 200 mg QOD and Midodrine 5 mg TID.  She is in Assisted living home since September 2023 - Memory care

## 2022-10-01 NOTE — Progress Notes (Signed)
Subjective:  Patient ID: [name], female    DOB: 07/26/1926  Age: 89 y.o. MRN: [id number]  Chief Complaint  Patient presents with   Hyperlipidemia   Memory Loss   Hypertension    HPI   Pt presents for follow-up of hypertension and hyperlipidemia. Her adult daughter is present to supplement history. Daughter reports pt has poor oral intake. Current weight 95 lbs, BMI 17.38. She has experienced a falls. Pt states she has a walker for ambulation.  She is there since September 13th 2023. Using walker.  Hypertension, follow-up:  She was last seen for hypertension 6 months ago.  BP at that visit was 104/58. Management includes Acebutolol 200 mg every other day and Midodrine 5 mg TID. She reports good compliance with treatment. She is not having side effects.  She is following a Regular diet. She is not exercising. She does not smoke. Use of agents associated with hypertension: none.    The ASCVD Risk score (Arnett DK, et al., 2019) failed to calculate for the following reasons:   The 2019 ASCVD risk score is only valid for ages 6 to 32   The patient has a prior MI or stroke diagnosis    Lipid/Cholesterol, Follow-up   She was last seen for this 6 months ago.  Management includes No medicine. She reports good compliance with treatment. She is not having side effects.  Current diet: in general, a "healthy" diet   Current exercise: none  Dementia Follow up: Pt has dementia related to cerebral atrophy. Pt is assisted living memory care unit. She is eating little bit more.  Afib: Patient is taking Eliquis 2.5 mg twice a day.  GERD: Takes Pantoprazole 40 mg daily.  She is taking Depakote 125 mg from 2021 after stroke since she was getting seizures and her dementia aggravating and this helped her to calm down.     10/29/2021    1:26 PM 03/28/2021    9:44 AM 09/25/2020   10:25 AM 05/22/2020   10:05 AM 01/10/2019    6:37 PM  Depression screen PHQ 2/9  Decreased Interest  1 0 1 3 0  Down, Depressed, Hopeless 1 0 1 3 0  PHQ - 2 Score 2 0 2 6 0  Altered sleeping 0  0 3   Tired, decreased energy 1  2 3    Change in appetite 3  0 3   Feeling bad or failure about yourself  1  0 3   Trouble concentrating 0   3   Moving slowly or fidgety/restless 0  2 3   Suicidal thoughts 0  0 1   PHQ-9 Score 7  6 25    Difficult doing work/chores   Somewhat difficult Extremely dIfficult          07/18/2021   11:09 AM 10/29/2021    1:26 PM 01/13/2022   11:36 AM 01/21/2022   11:10 AM 07/17/2022   11:26 AM  Fall Risk  Falls in the past year? 0 1 1 1 1   Was there an injury with Fall? 0 1 1 1 1   Fall Risk Category Calculator 0 3 3 3 3   Fall Risk Category (Retired) Low High High High High  (RETIRED) Patient Fall Risk Level Low fall risk High fall risk   High fall risk  Patient at Risk for Falls Due to    History of fall(s);Impaired balance/gait;Impaired vision   Fall risk Follow up     Falls evaluation completed  Review of Systems  Constitutional:  Negative for chills, fatigue and fever.  HENT:  Negative for congestion, ear pain and sore throat.   Respiratory:  Negative for cough and shortness of breath.   Cardiovascular:  Negative for chest pain and palpitations.  Gastrointestinal:  Negative for abdominal pain, constipation, diarrhea, nausea and vomiting.  Endocrine: Negative for polydipsia, polyphagia and polyuria.  Genitourinary:  Negative for difficulty urinating and dysuria.  Musculoskeletal:  Negative for arthralgias, back pain and myalgias.  Skin:  Negative for rash.  Neurological:  Negative for headaches.  Psychiatric/Behavioral:  Positive for agitation (at times according to her daughter). Negative for dysphoric mood. The patient is not nervous/anxious.     Current Outpatient Medications on File Prior to Visit  Medication Sig Dispense Refill   acebutolol (SECTRAL) 200 MG capsule Take 1 capsule (200 mg total) by mouth every other day. 45 capsule 3    acetaminophen (TYLENOL) 325 MG tablet Take 325 mg by mouth daily.     CALCIUM PO Take 250 mg by mouth daily.     Cholecalciferol (VITAMIN D3) 1000 units CAPS Take 1,000 Units by mouth daily.     divalproex (DEPAKOTE SPRINKLE) 125 MG capsule Take by mouth.     docusate sodium (COLACE) 100 MG capsule Take 100 mg by mouth 2 (two) times daily.     ELIQUIS 2.5 MG TABS tablet TAKE 1 TABLET(2.5 MG) BY MOUTH TWICE DAILY 180 tablet 1   midodrine (PROAMATINE) 5 MG tablet Take 1 tablet (5 mg total) by mouth 3 (three) times daily with meals. 90 tablet 6   mirtazapine (REMERON) 30 MG tablet TAKE 1 TABLET(30 MG) BY MOUTH AT BEDTIME 90 tablet 1   Multiple Vitamins-Minerals (ALIVE WOMENS 50+ GUMMY) CHEW Chew by mouth.     pantoprazole (PROTONIX) 40 MG tablet Take 40 mg by mouth daily.     polyethylene glycol (MIRALAX / GLYCOLAX) 17 g packet Take 17 g by mouth daily.     senna-docusate (SENOKOT-S) 8.6-50 MG tablet Take 1 tablet by mouth daily.     vitamin B-12 (CYANOCOBALAMIN) 500 MCG tablet Take 500 mcg by mouth daily.      No current facility-administered medications on file prior to visit.   Past Medical History:  Diagnosis Date   Acute ischemic stroke 11/08/2018   left postcentral gyri; left frontal   Adjustment disorder with depressed mood 10/16/2019   Alteration consciousness 03/08/2020   Apraxia following cerebrovascular accident 10/16/2019   B12 deficiency 10/16/2019   Bilateral impacted cerumen 04/02/2021   Body mass index (BMI) less than 19 02/16/2020   Chronic kidney disease, stage II (mild) 12/06/2019   Closed fracture of one rib of right side with routine healing 06/30/2017   Confusion    COPD (chronic obstructive pulmonary disease) 11/16/2017   Current use of long term anticoagulation 07/21/2017   Essential hypertension, benign 10/16/2019   First degree atrioventricular block 06/26/2017   Gait abnormality 09/24/2020   Hammer toe 06/26/2017   Hearing loss    Hyperlipidemia 06/26/2017    Laceration of right lower leg 06/18/2020   Major neurocognitive disorder due to Alzheimer's disease 08/16/2021   Malnutrition of moderate degree 12/06/2019   Memory loss    Other specified cardiac arrhythmias 06/26/2017   Pain in limb 06/26/2017   Palpitations 06/26/2017   Paroxysmal atrial fibrillation 06/26/2017   remote history post operatively, CHADS 2 score=2   Premature atrial complex 06/26/2017   Premature ventricular contractions 06/26/2017   PSVT (paroxysmal supraventricular tachycardia) 11/10/2017  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 10/16/2019   Thoracic aorta atherosclerosis 05/22/2020   Traumatic pneumothorax 06/30/2017   Past Surgical History:  Procedure Laterality Date   ABDOMINAL HYSTERECTOMY     HIP SURGERY  04/2022   SHOULDER SURGERY     TONSILLECTOMY      Family History  Problem Relation Age of Onset   Bone cancer Mother    Breast cancer Father    Diabetes Father    Heart disease Father    Dementia Sister    Breast cancer Sister    Alzheimer's disease Sister    Dementia Brother    Social History   Socioeconomic History   Marital status: Widowed    Spouse name: Not on file   Number of children: 2   Years of education: 12   Highest education level: High school graduate  Occupational History   Occupation: Retired  Tobacco Use   Smoking status: Never    Passive exposure: Past   Smokeless tobacco: Never  Vaping Use   Vaping Use: Never used  Substance and Sexual Activity   Alcohol use: No   Drug use: No   Sexual activity: Not Currently  Other Topics Concern   Not on file  Social History Narrative   Mrs. Piazza is widowed. Her daughter, [name], is her POA and lives 12 miles away. She resides in her own home, alone for last 7 years. Since her CVA she has had someone with her most of the time. She has a hired caregiver or a family member stays most of the day. She feel safe in her home and in her neighborhood.   Right-handed.    0.5 cups caffeine per day.   One story home   Social Determinants of Health   Financial Resource Strain: Low Risk  (01/10/2019)   Overall Financial Resource Strain (CARDIA)    Difficulty of Paying Living Expenses: Not hard at all  Food Insecurity: No Food Insecurity (01/10/2019)   Hunger Vital Sign    Worried About Running Out of Food in the Last Year: Never true    Ran Out of Food in the Last Year: Never true  Transportation Needs: No Transportation Needs (01/10/2019)   PRAPARE - Hydrologist (Medical): No    Lack of Transportation (Non-Medical): No  Physical Activity: Inactive (01/10/2019)   Exercise Vital Sign    Days of Exercise per Week: 0 days    Minutes of Exercise per Session: 0 min  Stress: No Stress Concern Present (01/10/2019)   [place]    Feeling of Stress : Not at all  Social Connections: Somewhat Isolated (01/10/2019)   Social Connection and Isolation Panel [NHANES]    Frequency of Communication with Friends and Family: More than three times a week    Frequency of Social Gatherings with Friends and Family: More than three times a week    Attends Religious Services: More than 4 times per year    Active Member of [employer] or Organizations: No    Attends Archivist Meetings: Never    Marital Status: Widowed    Objective:  BP (!) 104/58   Pulse 84   Temp 97.7 F (36.5 C)   Resp 14   Ht 5\' 2"  (1.575 m)   Wt 95 lb (43.1 kg)   LMP  (LMP Unknown)   SpO2 96%   BMI 17.38 kg/m      10/01/2022  10:12 AM 08/19/2022    2:01 PM 07/17/2022   11:22 AM  BP/Weight  Systolic BP 104 85 107  Diastolic BP 58 60 69  Wt. (Lbs) 95 93 93.6  BMI 17.38 kg/m2 17.01 kg/m2 17.12 kg/m2    Physical Exam Constitutional:      Comments: Underweight  HENT:     Right Ear: Tympanic membrane normal.     Left Ear: Tympanic membrane normal.     Nose: No congestion.     Mouth/Throat:      Mouth: Mucous membranes are moist.  Cardiovascular:     Rate and Rhythm: Normal rate. Rhythm irregular.  Pulmonary:     Effort: Pulmonary effort is normal.     Breath sounds: Normal breath sounds.  Abdominal:     General: There is no distension.     Palpations: There is no mass.  Neurological:     General: No focal deficit present.     Mental Status: She is alert.     Motor: Weakness present.     Gait: Gait abnormal.     Comments: Using walker for her balance  Psychiatric:        Mood and Affect: Mood normal.    Lab Results  Component Value Date   WBC 5.4 03/27/2022   HGB 11.5 03/27/2022   HCT 35.5 03/27/2022   PLT 197 03/27/2022   GLUCOSE 78 03/27/2022   CHOL 112 03/18/2021   TRIG 78 03/18/2021   HDL 46 03/18/2021   LDLCALC 50 03/18/2021   ALT 10 03/27/2022   AST 16 03/27/2022   NA 140 03/27/2022   K 4.8 03/27/2022   CL 101 03/27/2022   CREATININE 0.86 03/27/2022   BUN 19 03/27/2022   CO2 27 03/27/2022   TSH 1.460 05/22/2020   HGBA1C 5.3 11/09/2018      Assessment & Plan:    Essential hypertension, benign Assessment & Plan: Well controlled- she runs in soft side as a baseline Taking Acebutolol 200 mg QOD and Midodrine 5 mg TID.  She is in Assisted living home since September 2023 - Memory care  Orders: -     Comprehensive metabolic panel -     CBC with Differential/Platelet  Acute ischemic stroke Assessment & Plan: Eliquis 2.5 mg twice a day. Following to Cardiology and Neurology   PSVT (paroxysmal supraventricular tachycardia)  Memory loss -     TSH  B12 deficiency -     Vitamin B12  Pure hypercholesterolemia Assessment & Plan: Not on any meds now, trying to wean her off from the meds she does not need at this stage of her life- Daughter agree with the plan  Orders: -     Lipid panel  Gait abnormality Assessment & Plan: Using walker constantly now   Hypotensive episode Assessment & Plan: Taking midodrine 5 mg three times  daily   Malnutrition of moderate degree Assessment & Plan: Encouraged to drink Ensure and take more calories food     Orders Placed This Encounter  Procedures   Comprehensive metabolic panel   Lipid panel   CBC with Differential/Platelet   TSH   Vitamin B12     Follow-up: Return in about 4 months (around 01/30/2023) for CHRONIC, FASTING. I, Lurline Del have reviewed all documentation for this visit. The documentation on 10/01/22   for the exam, diagnosis, procedures, and orders are all accurate and complete.     An After Visit Summary was printed and given to the patient.  Lurline Del,  [place] [phone]

## 2022-10-01 NOTE — Assessment & Plan Note (Signed)
Using walker constantly now

## 2022-10-01 NOTE — Assessment & Plan Note (Signed)
Taking midodrine 5 mg three times daily

## 2022-10-01 NOTE — Assessment & Plan Note (Signed)
Encouraged to drink Ensure and take more calories food

## 2022-10-02 LAB — CBC WITH DIFFERENTIAL/PLATELET
Basophils Absolute: 0.1 10*3/uL (ref 0.0–0.2)
Basos: 1 %
EOS (ABSOLUTE): 0.2 10*3/uL (ref 0.0–0.4)
Eos: 3 %
Hematocrit: 30.8 % — ABNORMAL LOW (ref 34.0–46.6)
Hemoglobin: 10.6 g/dL — ABNORMAL LOW (ref 11.1–15.9)
Immature Grans (Abs): 0 10*3/uL (ref 0.0–0.1)
Immature Granulocytes: 1 %
Lymphocytes Absolute: 1.9 10*3/uL (ref 0.7–3.1)
Lymphs: 25 %
MCH: 33.9 pg — ABNORMAL HIGH (ref 26.6–33.0)
MCHC: 34.4 g/dL (ref 31.5–35.7)
MCV: 98 fL — ABNORMAL HIGH (ref 79–97)
Monocytes Absolute: 0.7 10*3/uL (ref 0.1–0.9)
Monocytes: 10 %
Neutrophils Absolute: 4.5 10*3/uL (ref 1.4–7.0)
Neutrophils: 60 %
Platelets: 459 10*3/uL — ABNORMAL HIGH (ref 150–450)
RBC: 3.13 x10E6/uL — ABNORMAL LOW (ref 3.77–5.28)
RDW: 11.8 % (ref 11.7–15.4)
WBC: 7.4 10*3/uL (ref 3.4–10.8)

## 2022-10-02 LAB — LIPID PANEL
Chol/HDL Ratio: 4.4 ratio (ref 0.0–4.4)
Cholesterol, Total: 204 mg/dL — ABNORMAL HIGH (ref 100–199)
HDL: 46 mg/dL (ref >39)
LDL Chol Calc (NIH): 138 mg/dL — ABNORMAL HIGH (ref 0–99)
Triglycerides: 113 mg/dL (ref 0–149)
VLDL Cholesterol Cal: 20 mg/dL (ref 5–40)

## 2022-10-02 LAB — COMPREHENSIVE METABOLIC PANEL
ALT: 6 IU/L (ref 0–32)
AST: 15 IU/L (ref 0–40)
Albumin/Globulin Ratio: 1.2 (ref 1.2–2.2)
Albumin: 3.8 g/dL (ref 3.6–4.6)
Alkaline Phosphatase: 267 IU/L — ABNORMAL HIGH (ref 44–121)
BUN/Creatinine Ratio: 24 (ref 12–28)
BUN: 18 mg/dL (ref 10–36)
Bilirubin Total: 0.3 mg/dL (ref 0.0–1.2)
CO2: 23 mmol/L (ref 20–29)
Calcium: 9 mg/dL (ref 8.7–10.3)
Chloride: 102 mmol/L (ref 96–106)
Creatinine, Ser: 0.75 mg/dL (ref 0.57–1.00)
Globulin, Total: 3.1 g/dL (ref 1.5–4.5)
Glucose: 81 mg/dL (ref 70–99)
Potassium: 4.9 mmol/L (ref 3.5–5.2)
Sodium: 137 mmol/L (ref 134–144)
Total Protein: 6.9 g/dL (ref 6.0–8.5)
eGFR: 74 mL/min/{1.73_m2} (ref >59)

## 2022-10-02 LAB — CARDIOVASCULAR RISK ASSESSMENT

## 2022-10-02 LAB — TSH: TSH: 2.44 u[IU]/mL (ref 0.450–4.500)

## 2022-10-02 LAB — VITAMIN B12: Vitamin B-12: 854 pg/mL (ref 232–1245)

## 2022-10-21 DIAGNOSIS — I7091 Generalized atherosclerosis: Secondary | ICD-10-CM | POA: Diagnosis not present

## 2022-10-21 DIAGNOSIS — B351 Tinea unguium: Secondary | ICD-10-CM | POA: Diagnosis not present

## 2022-11-07 ENCOUNTER — Encounter: Payer: Self-pay | Admitting: Nurse Practitioner

## 2022-11-11 DIAGNOSIS — S32599A Other specified fracture of unspecified pubis, initial encounter for closed fracture: Secondary | ICD-10-CM | POA: Diagnosis not present

## 2022-11-15 DIAGNOSIS — I4891 Unspecified atrial fibrillation: Secondary | ICD-10-CM | POA: Diagnosis not present

## 2022-11-15 DIAGNOSIS — W19XXXA Unspecified fall, initial encounter: Secondary | ICD-10-CM | POA: Diagnosis not present

## 2022-11-15 DIAGNOSIS — F0390 Unspecified dementia without behavioral disturbance: Secondary | ICD-10-CM | POA: Diagnosis not present

## 2022-11-15 DIAGNOSIS — R410 Disorientation, unspecified: Secondary | ICD-10-CM | POA: Diagnosis not present

## 2022-11-15 DIAGNOSIS — M47812 Spondylosis without myelopathy or radiculopathy, cervical region: Secondary | ICD-10-CM | POA: Diagnosis not present

## 2022-11-15 DIAGNOSIS — G3189 Other specified degenerative diseases of nervous system: Secondary | ICD-10-CM | POA: Diagnosis not present

## 2022-11-15 DIAGNOSIS — M549 Dorsalgia, unspecified: Secondary | ICD-10-CM | POA: Diagnosis not present

## 2022-11-15 DIAGNOSIS — Z9181 History of falling: Secondary | ICD-10-CM | POA: Diagnosis not present

## 2022-11-15 DIAGNOSIS — S3993XA Unspecified injury of pelvis, initial encounter: Secondary | ICD-10-CM | POA: Diagnosis not present

## 2022-11-15 DIAGNOSIS — M545 Low back pain, unspecified: Secondary | ICD-10-CM | POA: Diagnosis not present

## 2022-11-17 ENCOUNTER — Telehealth: Payer: Self-pay

## 2022-11-17 NOTE — Transitions of Care (Post Inpatient/ED Visit) (Signed)
   11/17/2022  Name: BARBETTA FERONE MRN: [id number] DOB: Dec 05, 1925  Today's TOC FU Call Status: TOC FU Call Complete Date: 11/17/22  Patient is a resident of [facility].  She had a fall and was evaluated at [facility].  No acute findings were found and she was released.  Staff at [facility] states that she is doing well.  Transition Care Management Follow-up Telephone Call Date of Discharge: 11/15/22 Discharge Facility: Other ([facility]) Name of Other ([facility]) Discharge Facility: [facility] Type of Discharge: Emergency Department Reason for ED Visit: Other: (Fall) How have you been since you were released from the hospital?: Better Any questions or concerns?: No  Items Reviewed: Did you receive and understand the discharge instructions provided?: Yes Medications obtained and verified?: Yes (Medications Reviewed) (No changes) Any new allergies since your discharge?: No Dietary orders reviewed?: NA Do you have support at home?: Yes ([place]) People in Home: facility resident Name of Support/Comfort Primary Source: [place] and Equipment/Supplies: [place] Ordered?: No Any new equipment or medical supplies ordered?: No  Functional Questionnaire: Do you need assistance with bathing/showering or dressing?: Yes Do you need assistance with meal preparation?: Yes Do you need assistance with eating?: No Do you have difficulty maintaining continence: Yes Do you need assistance with getting out of bed/getting out of a chair/moving?: No Do you have difficulty managing or taking your medications?: Yes  Follow up appointments reviewed: PCP Follow-up appointment confirmed?: NA (Will follow-up PRN) [facility] Follow-up appointment confirmed?: NA Do you need transportation to your follow-up appointment?: No Do you understand care options if your condition(s) worsen?: Yes-patient verbalized  understanding    SIGNATURE: Shelle Iron, LPN

## 2022-11-19 DIAGNOSIS — W19XXXA Unspecified fall, initial encounter: Secondary | ICD-10-CM | POA: Diagnosis not present

## 2022-11-19 DIAGNOSIS — S0990XA Unspecified injury of head, initial encounter: Secondary | ICD-10-CM | POA: Diagnosis not present

## 2022-11-20 ENCOUNTER — Encounter: Payer: Self-pay | Admitting: Family Medicine

## 2022-11-20 ENCOUNTER — Encounter: Payer: Self-pay | Admitting: Neurology

## 2022-11-20 DIAGNOSIS — R9431 Abnormal electrocardiogram [ECG] [EKG]: Secondary | ICD-10-CM | POA: Diagnosis not present

## 2022-11-20 DIAGNOSIS — S0990XA Unspecified injury of head, initial encounter: Secondary | ICD-10-CM | POA: Diagnosis not present

## 2022-11-20 DIAGNOSIS — I6782 Cerebral ischemia: Secondary | ICD-10-CM | POA: Diagnosis not present

## 2022-11-20 DIAGNOSIS — I4891 Unspecified atrial fibrillation: Secondary | ICD-10-CM | POA: Diagnosis not present

## 2022-11-20 DIAGNOSIS — Z043 Encounter for examination and observation following other accident: Secondary | ICD-10-CM | POA: Diagnosis not present

## 2022-11-20 DIAGNOSIS — W19XXXA Unspecified fall, initial encounter: Secondary | ICD-10-CM | POA: Diagnosis not present

## 2022-11-20 DIAGNOSIS — I10 Essential (primary) hypertension: Secondary | ICD-10-CM | POA: Diagnosis not present

## 2022-11-20 DIAGNOSIS — Z79899 Other long term (current) drug therapy: Secondary | ICD-10-CM | POA: Diagnosis not present

## 2022-11-20 DIAGNOSIS — F0390 Unspecified dementia without behavioral disturbance: Secondary | ICD-10-CM | POA: Diagnosis not present

## 2022-11-21 ENCOUNTER — Other Ambulatory Visit: Payer: Self-pay | Admitting: Neurology

## 2022-11-21 DIAGNOSIS — F01518 Vascular dementia, unspecified severity, with other behavioral disturbance: Secondary | ICD-10-CM

## 2022-11-21 MED ORDER — MIRTAZAPINE 30 MG PO TABS: ORAL_TABLET | ORAL | 3 refills | Status: DC

## 2022-11-25 ENCOUNTER — Encounter: Payer: Self-pay | Admitting: Family Medicine

## 2022-12-15 ENCOUNTER — Encounter: Payer: Self-pay | Admitting: Neurology

## 2022-12-16 MED ORDER — DIVALPROEX SODIUM 125 MG PO CSDR: DELAYED_RELEASE_CAPSULE | ORAL | 3 refills | Status: DC

## 2022-12-16 MED ORDER — MIRTAZAPINE 45 MG PO TABS: ORAL_TABLET | ORAL | 3 refills | Status: DC

## 2022-12-22 ENCOUNTER — Other Ambulatory Visit: Payer: Self-pay

## 2022-12-22 MED ORDER — APIXABAN 2.5 MG PO TABS: ORAL_TABLET | ORAL | 1 refills | Status: DC

## 2022-12-22 NOTE — Telephone Encounter (Signed)
Received faxed refill request from pharmacy.  Pt last saw Dr Dulce Sellar 08/19/22, last labs 10/01/22 Creat 0.75, age 89, weight 43.1kg based on specified criteria pt is on appropriate dosage of Eliquis 2.5mg  BID for afib.  Will refill rx.

## 2022-12-26 ENCOUNTER — Other Ambulatory Visit: Payer: Self-pay | Admitting: Cardiology

## 2023-01-06 ENCOUNTER — Telehealth: Payer: Self-pay

## 2023-01-06 NOTE — Progress Notes (Signed)
Care Management & Coordination Services Pharmacy Team  Reason for Encounter: General adherence update   Contacted patient for general health update and medication adherence call.  Spoke with family on 01/06/2023. Patient's daughter stated patient is in a long term care facility. Sent message to [facility] to un enroll patient.   Chart Updates: Recent office visits:  10-01-2021 Lurline Del, FNP. Follow up visit no changes.  Recent consult visits:  08-19-2022 Baldo Daub, MD (Cardiology). Stop atorvastatin. Increase midodrine 5 mg twice daily to 5 mg TID.  07-30-2022 Duanne Guess, MD (General surgery). Visit for wound check.  Hospital visits:  None in previous 6 months  Medications: Outpatient Encounter Medications as of 01/06/2023  Medication Sig   acebutolol (SECTRAL) 200 MG capsule Take 1 capsule (200 mg total) by mouth every other day.   acetaminophen (TYLENOL) 325 MG tablet Take 325 mg by mouth daily.   apixaban (ELIQUIS) 2.5 MG TABS tablet TAKE 1 TABLET(2.5 MG) BY MOUTH TWICE DAILY   CALCIUM PO Take 250 mg by mouth daily.   Cholecalciferol (VITAMIN D3) 1000 units CAPS Take 1,000 Units by mouth daily.   divalproex (DEPAKOTE SPRINKLE) 125 MG capsule Take 1 capsule in AM, 4 capsules in PM   docusate sodium (COLACE) 100 MG capsule Take 100 mg by mouth 2 (two) times daily.   midodrine (PROAMATINE) 5 MG tablet Take 1 tablet (5 mg total) by mouth 3 (three) times daily with meals.   mirtazapine (REMERON) 45 MG tablet Take 1 tablet every night   Multiple Vitamins-Minerals (ALIVE WOMENS 50+ GUMMY) CHEW Chew by mouth.   pantoprazole (PROTONIX) 40 MG tablet Take 40 mg by mouth daily.   polyethylene glycol (MIRALAX / GLYCOLAX) 17 g packet Take 17 g by mouth daily.   senna-docusate (SENOKOT-S) 8.6-50 MG tablet Take 1 tablet by mouth daily.   vitamin B-12 (CYANOCOBALAMIN) 500 MCG tablet Take 500 mcg by mouth daily.    No facility-administered encounter medications on file as of  01/06/2023.    Recent vitals BP Readings from Last 3 Encounters:  10/01/22 (!) 104/58  08/19/22 (!) 85/60  07/17/22 107/69   Pulse Readings from Last 3 Encounters:  10/01/22 84  08/19/22 (!) 108  07/17/22 80   Wt Readings from Last 3 Encounters:  10/01/22 95 lb (43.1 kg)  08/19/22 93 lb (42.2 kg)  07/17/22 93 lb 9.6 oz (42.5 kg)   BMI Readings from Last 3 Encounters:  10/01/22 17.38 kg/m  08/19/22 17.01 kg/m  07/17/22 17.12 kg/m    Recent lab results    Component Value Date/Time   NA 137 10/01/2022 1049   K 4.9 10/01/2022 1049   CL 102 10/01/2022 1049   CO2 23 10/01/2022 1049   GLUCOSE 81 10/01/2022 1049   GLUCOSE 85 11/09/2018 0512   BUN 18 10/01/2022 1049   CREATININE 0.75 10/01/2022 1049   CALCIUM 9.0 10/01/2022 1049    Lab Results  Component Value Date   CREATININE 0.75 10/01/2022   EGFR 74 10/01/2022   GFRNONAA 66 09/25/2020   GFRAA 76 09/25/2020   Lab Results  Component Value Date/Time   HGBA1C 5.3 11/09/2018 05:12 AM    Lab Results  Component Value Date   CHOL 204 (H) 10/01/2022   HDL 46 10/01/2022   LDLCALC 138 (H) 10/01/2022   TRIG 113 10/01/2022   CHOLHDL 4.4 10/01/2022    Care Gaps: Annual wellness visit in last year? Yes Shingrix overdue Dexa scan Covid booster overdue  Star Rating Drugs: None  [place] Pharmacist Assistant [phone]

## 2023-01-13 ENCOUNTER — Telehealth: Payer: Self-pay

## 2023-01-13 NOTE — Telephone Encounter (Signed)
Dedra from [facility] called to request PT orders for patient and stated that when patient comes in for appointment on 01/27/23 we need to document that Physical therapy is for her Dementia and that we have ordered it. She will fax over the orders.

## 2023-01-15 DIAGNOSIS — Z7901 Long term (current) use of anticoagulants: Secondary | ICD-10-CM | POA: Diagnosis not present

## 2023-01-15 DIAGNOSIS — F0390 Unspecified dementia without behavioral disturbance: Secondary | ICD-10-CM | POA: Diagnosis not present

## 2023-01-15 DIAGNOSIS — I4891 Unspecified atrial fibrillation: Secondary | ICD-10-CM | POA: Diagnosis not present

## 2023-01-15 DIAGNOSIS — Z9181 History of falling: Secondary | ICD-10-CM | POA: Diagnosis not present

## 2023-01-27 ENCOUNTER — Ambulatory Visit (INDEPENDENT_AMBULATORY_CARE_PROVIDER_SITE_OTHER): Payer: PPO | Admitting: Family Medicine

## 2023-01-27 VITALS — BP 102/60 | HR 103 | Temp 96.7°F | Ht 62.0 in | Wt 88.0 lb

## 2023-01-27 DIAGNOSIS — F332 Major depressive disorder, recurrent severe without psychotic features: Secondary | ICD-10-CM | POA: Diagnosis not present

## 2023-01-27 DIAGNOSIS — G309 Alzheimer's disease, unspecified: Secondary | ICD-10-CM

## 2023-01-27 DIAGNOSIS — H6122 Impacted cerumen, left ear: Secondary | ICD-10-CM | POA: Diagnosis not present

## 2023-01-27 DIAGNOSIS — K5904 Chronic idiopathic constipation: Secondary | ICD-10-CM | POA: Insufficient documentation

## 2023-01-27 DIAGNOSIS — D6869 Other thrombophilia: Secondary | ICD-10-CM

## 2023-01-27 DIAGNOSIS — E782 Mixed hyperlipidemia: Secondary | ICD-10-CM

## 2023-01-27 DIAGNOSIS — I10 Essential (primary) hypertension: Secondary | ICD-10-CM | POA: Diagnosis not present

## 2023-01-27 DIAGNOSIS — E440 Moderate protein-calorie malnutrition: Secondary | ICD-10-CM

## 2023-01-27 DIAGNOSIS — F0280 Dementia in other diseases classified elsewhere without behavioral disturbance: Secondary | ICD-10-CM

## 2023-01-27 MED ORDER — POLYETHYLENE GLYCOL 3350 17 G PO PACK: 8.5000 g | PACK | Freq: Every day | ORAL | 0 refills | Status: DC

## 2023-01-27 NOTE — Progress Notes (Addendum)
Subjective:  Patient ID: [name], female    DOB: [birth year]  Age: 89 y.o. MRN: [id number]  Chief Complaint  Patient presents with   Medical Management of Chronic Issues    HPI   GERD: Protonix 40 mg daily  Dementia: Pt has dementia related to cerebral atrophy. Pt is assisted living memory care unit. She is eating little bit more.  Afib: Patient is taking Eliquis 2.5 mg twice a day.    She is taking Depakote 125 mg from 2021 after stroke since she was getting seizures and her dementia aggravating and this helped her to calm down.   Depression/Insomnia/Appetite: recently increased Remeron to 45 mg qhs     01/27/2023   11:07 AM 10/29/2021    1:26 PM 03/28/2021    9:44 AM 09/25/2020   10:25 AM 05/22/2020   10:05 AM  Depression screen PHQ 2/9  Decreased Interest 3 1 0 1 3  Down, Depressed, Hopeless 3 1 0 1 3  PHQ - 2 Score 6 2 0 2 6  Altered sleeping 3 0  0 3  Tired, decreased energy 3 1  2 3   Change in appetite 3 3  0 3  Feeling bad or failure about yourself  0 1  0 3  Trouble concentrating 3 0   3  Moving slowly or fidgety/restless 3 0  2 3  Suicidal thoughts  0  0 1  PHQ-9 Score 21 7  6 25   Difficult doing work/chores Extremely dIfficult   Somewhat difficult Extremely dIfficult        01/27/2023   10:58 AM  Fall Risk   Falls in the past year? 1  Number falls in past yr: 1  Injury with Fall? 1  Risk for fall due to : No Fall Risks;Impaired balance/gait  Follow up Falls evaluation completed    Patient Care Team: Blane Ohara, MD as PCP - General (Family Medicine) Earvin Hansen, [facility] (Inactive) as Pharmacist (Pharmacist) Zigmund Daniel., MD as Consulting Physician (Family Medicine) Baldo Daub, MD as Consulting Physician (Cardiology) Van Clines, MD as Consulting Physician (Neurology)   Review of Systems  Constitutional:  Negative for chills, fatigue and fever.  HENT:  Negative for congestion, ear pain, rhinorrhea and sore throat.    Respiratory:  Negative for cough and shortness of breath.   Cardiovascular:  Negative for chest pain.  Gastrointestinal:  Positive for diarrhea. Negative for abdominal pain, constipation, nausea and vomiting.  Genitourinary:  Positive for urgency (wears diapers). Negative for dysuria.  Musculoskeletal:  Negative for back pain and myalgias.  Neurological:  Negative for dizziness, weakness, light-headedness and headaches.  Psychiatric/Behavioral:  Negative for dysphoric mood. The patient is not nervous/anxious.     Current Outpatient Medications on File Prior to Visit  Medication Sig Dispense Refill   acebutolol (SECTRAL) 200 MG capsule Take 1 capsule (200 mg total) by mouth every other day. 45 capsule 3   acetaminophen (TYLENOL) 325 MG tablet Take 325 mg by mouth daily.     apixaban (ELIQUIS) 2.5 MG TABS tablet TAKE 1 TABLET(2.5 MG) BY MOUTH TWICE DAILY 180 tablet 1   divalproex (DEPAKOTE SPRINKLE) 125 MG capsule Take 1 capsule in AM, 4 capsules in PM 450 capsule 3   docusate sodium (COLACE) 100 MG capsule Take 100 mg by mouth 2 (two) times daily.     midodrine (PROAMATINE) 5 MG tablet Take 1 tablet (5 mg total) by mouth 3 (three) times daily with meals.  270 tablet 1   mirtazapine (REMERON) 45 MG tablet Take 1 tablet every night 90 tablet 3   Multiple Vitamins-Minerals (ALIVE WOMENS 50+ GUMMY) CHEW Chew by mouth.     pantoprazole (PROTONIX) 40 MG tablet Take 40 mg by mouth daily.     No current facility-administered medications on file prior to visit.   Past Medical History:  Diagnosis Date   Acute ischemic stroke 11/08/2018   left postcentral gyri; left frontal   Adjustment disorder with depressed mood 10/16/2019   Alteration consciousness 03/08/2020   Apraxia following cerebrovascular accident 10/16/2019   B12 deficiency 10/16/2019   Bilateral impacted cerumen 04/02/2021   Body mass index (BMI) less than 19 02/16/2020   Chronic kidney disease, stage II (mild) 12/06/2019   Closed  fracture of one rib of right side with routine healing 06/30/2017   Confusion    COPD (chronic obstructive pulmonary disease) 11/16/2017   Current use of long term anticoagulation 07/21/2017   Essential hypertension, benign 10/16/2019   First degree atrioventricular block 06/26/2017   Gait abnormality 09/24/2020   Hammer toe 06/26/2017   Hearing loss    Hyperlipidemia 06/26/2017   Laceration of right lower leg 06/18/2020   Major neurocognitive disorder due to Alzheimer's disease 08/16/2021   Malnutrition of moderate degree 12/06/2019   Memory loss    Other specified cardiac arrhythmias 06/26/2017   Pain in limb 06/26/2017   Palpitations 06/26/2017   Paroxysmal atrial fibrillation 06/26/2017   remote history post operatively, CHADS 2 score=2   Premature atrial complex 06/26/2017   Premature ventricular contractions 06/26/2017   PSVT (paroxysmal supraventricular tachycardia) 11/10/2017   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 10/16/2019   Thoracic aorta atherosclerosis 05/22/2020   Traumatic pneumothorax 06/30/2017   Past Surgical History:  Procedure Laterality Date   ABDOMINAL HYSTERECTOMY     HIP SURGERY  04/2022   SHOULDER SURGERY     TONSILLECTOMY      Family History  Problem Relation Age of Onset   Bone cancer Mother    Breast cancer Father    Diabetes Father    Heart disease Father    Dementia Sister    Breast cancer Sister    Alzheimer's disease Sister    Dementia Brother    Social History   Socioeconomic History   Marital status: Widowed    Spouse name: Not on file   Number of children: 2   Years of education: 51   Highest education level: 12th grade  Occupational History   Occupation: Retired  Tobacco Use   Smoking status: Never    Passive exposure: Past   Smokeless tobacco: Never  Vaping Use   Vaping Use: Never used  Substance and Sexual Activity   Alcohol use: No   Drug use: No   Sexual activity: Not Currently  Other Topics Concern    Not on file  Social History Narrative   [name] is widowed. Her daughter, [name], is her POA and lives 12 miles away. She resides in her own home, alone for last 7 years. Since her CVA she has had someone with her most of the time. She has a hired caregiver or a family member stays most of the day. She feel safe in her home and in her neighborhood.   Right-handed.   0.5 cups caffeine per day.   One story home   Social Determinants of Health   Financial Resource Strain: Low Risk  (01/23/2023)   Overall Financial Resource Strain (CARDIA)  Difficulty of Paying Living Expenses: Not hard at all  Food Insecurity: No Food Insecurity (01/23/2023)   Hunger Vital Sign    Worried About Running Out of Food in the Last Year: Never true    Ran Out of Food in the Last Year: Never true  Transportation Needs: No Transportation Needs (01/23/2023)   PRAPARE - Administrator, Civil Service (Medical): No    Lack of Transportation (Non-Medical): No  Physical Activity: Unknown (01/23/2023)   Exercise Vital Sign    Days of Exercise per Week: 0 days    Minutes of Exercise per Session: Not on file  Stress: Stress Concern Present (01/23/2023)   [employer] of Occupational Health - Occupational Stress Questionnaire    Feeling of Stress : To some extent  Social Connections: Moderately Isolated (01/23/2023)   Social Connection and Isolation Panel [NHANES]    Frequency of Communication with Friends and Family: More than three times a week    Frequency of Social Gatherings with Friends and Family: Twice a week    Attends Religious Services: 1 to 4 times per year    Active Member of [employer] or Organizations: No    Attends Banker Meetings: Not on file    Marital Status: Widowed    Objective:  BP 102/60   Pulse (!) 103   Temp (!) 96.7 F (35.9 C)   Ht 5\' 2"  (1.575 m)   Wt 88 lb (39.9 kg)   LMP  (LMP Unknown)   SpO2 96%   BMI 16.10 kg/m      01/27/2023   10:56 AM  10/01/2022   10:12 AM 08/19/2022    2:01 PM  BP/Weight  Systolic BP 102 104 85  Diastolic BP 60 58 60  Wt. (Lbs) 88 95 93  BMI 16.1 kg/m2 17.38 kg/m2 17.01 kg/m2    Physical Exam Vitals reviewed.  Constitutional:      Appearance: Normal appearance.     Comments: Underweight. Frail.  HENT:     Right Ear: Tympanic membrane, ear canal and external ear normal.     Left Ear: External ear normal. There is impacted cerumen.  Neck:     Vascular: No carotid bruit.  Cardiovascular:     Rate and Rhythm: Normal rate and regular rhythm.     Heart sounds: Normal heart sounds.  Pulmonary:     Effort: Pulmonary effort is normal. No respiratory distress.     Breath sounds: Normal breath sounds.  Abdominal:     General: Abdomen is flat. Bowel sounds are normal.     Palpations: Abdomen is soft.     Tenderness: There is no abdominal tenderness.  Neurological:     Mental Status: She is alert.     Diabetic Foot Exam - Simple   No data filed      Lab Results  Component Value Date   WBC 6.6 01/27/2023   HGB 11.8 01/27/2023   HCT 35.4 01/27/2023   PLT 260 01/27/2023   GLUCOSE 119 (H) 01/27/2023   CHOL 204 (H) 10/01/2022   TRIG 113 10/01/2022   HDL 46 10/01/2022   LDLCALC 138 (H) 10/01/2022   ALT 5 01/27/2023   AST 14 01/27/2023   NA 140 01/27/2023   K 4.9 01/27/2023   CL 100 01/27/2023   CREATININE 0.71 01/27/2023   BUN 24 01/27/2023   CO2 23 01/27/2023   TSH 2.190 01/27/2023   HGBA1C 5.3 11/09/2018      Assessment & Plan:  Chronic idiopathic constipation Assessment & Plan: Discontinue Senokot.  Take one tablespoon Miralax daily.   Orders: -     CBC with Differential/Platelet -     Comprehensive metabolic panel -     TSH  Essential hypertension, benign Assessment & Plan: Well controlled.  No changes to medicines. Acetabutolol 200 mg every other day. Continue to work on eating a healthy diet and exercise.  Labs drawn today.     Malnutrition of moderate  degree Assessment & Plan: Recommend eat 3 meals per day.  Continue mirtazepine 45 mg before bed.    Mixed hyperlipidemia Assessment & Plan: Recommend continue to work on eating healthy diet and exercise.     Acquired thrombophilia (HCC) Assessment & Plan: Patient on eliquis.   Major neurocognitive disorder due to Alzheimer's disease Assessment & Plan: Advancing. Medications: did not help so were discontinued.    Severe episode of recurrent major depressive disorder, without psychotic features [facility]) Assessment & Plan: Continue remeron.  Patient is unhappy with her situation, but she and her daughter do not believe she needs more medicine.    Hearing loss of left ear due to cerumen impaction Assessment & Plan: Left cerumen irrigation successful.   Other orders -     Polyethylene Glycol 3350; Take 8.5 g by mouth daily.  Dispense: 1 each; Refill: 0     Meds ordered this encounter  Medications   polyethylene glycol (MIRALAX / GLYCOLAX) 17 g packet    Sig: Take 8.5 g by mouth daily.    Dispense:  1 each    Refill:  0    Orders Placed This Encounter  Procedures   CBC with Differential/Platelet   Comprehensive metabolic panel   TSH     Follow-up: No follow-ups on file.   I,Katherina A Bramblett,acting as a scribe for Blane Ohara, MD.,have documented all relevant documentation on the behalf of Blane Ohara, MD,as directed by  Blane Ohara, MD while in the presence of Blane Ohara, MD.   Clayborn Bigness I Leal-Borjas,acting as a scribe for Blane Ohara, MD.,have documented all relevant documentation on the behalf of Blane Ohara, MD,as directed by  Blane Ohara, MD while in the presence of Blane Ohara, MD.    An After Visit Summary was printed and given to the patient.  I attest that I have reviewed this visit and agree with the plan scribed by my staff.   Blane Ohara, MD [facility] [phone]

## 2023-01-27 NOTE — Patient Instructions (Signed)
Stop sennakot.  Decrease miralax 17 gm 1/2 dose daily.

## 2023-01-27 NOTE — Assessment & Plan Note (Signed)
Discontinue Senokot.  Take one tablespoon Miralax.

## 2023-01-28 ENCOUNTER — Telehealth: Payer: Self-pay

## 2023-01-28 DIAGNOSIS — I4891 Unspecified atrial fibrillation: Secondary | ICD-10-CM | POA: Diagnosis not present

## 2023-01-28 DIAGNOSIS — Z7901 Long term (current) use of anticoagulants: Secondary | ICD-10-CM | POA: Diagnosis not present

## 2023-01-28 DIAGNOSIS — Z9181 History of falling: Secondary | ICD-10-CM | POA: Diagnosis not present

## 2023-01-28 DIAGNOSIS — F0390 Unspecified dementia without behavioral disturbance: Secondary | ICD-10-CM | POA: Diagnosis not present

## 2023-01-28 LAB — CBC WITH DIFFERENTIAL/PLATELET
Basophils Absolute: 0 10*3/uL (ref 0.0–0.2)
Basos: 1 %
EOS (ABSOLUTE): 0.2 10*3/uL (ref 0.0–0.4)
Eos: 3 %
Hematocrit: 35.4 % (ref 34.0–46.6)
Hemoglobin: 11.8 g/dL (ref 11.1–15.9)
Immature Grans (Abs): 0 10*3/uL (ref 0.0–0.1)
Immature Granulocytes: 0 %
Lymphocytes Absolute: 2.2 10*3/uL (ref 0.7–3.1)
Lymphs: 34 %
MCH: 33.7 pg — ABNORMAL HIGH (ref 26.6–33.0)
MCHC: 33.3 g/dL (ref 31.5–35.7)
MCV: 101 fL — ABNORMAL HIGH (ref 79–97)
Monocytes Absolute: 0.7 10*3/uL (ref 0.1–0.9)
Monocytes: 11 %
Neutrophils Absolute: 3.4 10*3/uL (ref 1.4–7.0)
Neutrophils: 51 %
Platelets: 260 10*3/uL (ref 150–450)
RBC: 3.5 x10E6/uL — ABNORMAL LOW (ref 3.77–5.28)
RDW: 13.9 % (ref 11.7–15.4)
WBC: 6.6 10*3/uL (ref 3.4–10.8)

## 2023-01-28 LAB — COMPREHENSIVE METABOLIC PANEL
ALT: 5 IU/L (ref 0–32)
AST: 14 IU/L (ref 0–40)
Albumin/Globulin Ratio: 1.3 (ref 1.2–2.2)
Albumin: 3.8 g/dL (ref 3.6–4.6)
Alkaline Phosphatase: 74 IU/L (ref 44–121)
BUN/Creatinine Ratio: 34 — ABNORMAL HIGH (ref 12–28)
BUN: 24 mg/dL (ref 10–36)
Bilirubin Total: 0.2 mg/dL (ref 0.0–1.2)
CO2: 23 mmol/L (ref 20–29)
Calcium: 9.2 mg/dL (ref 8.7–10.3)
Chloride: 100 mmol/L (ref 96–106)
Creatinine, Ser: 0.71 mg/dL (ref 0.57–1.00)
Globulin, Total: 3 g/dL (ref 1.5–4.5)
Glucose: 119 mg/dL — ABNORMAL HIGH (ref 70–99)
Potassium: 4.9 mmol/L (ref 3.5–5.2)
Sodium: 140 mmol/L (ref 134–144)
Total Protein: 6.8 g/dL (ref 6.0–8.5)
eGFR: 79 mL/min/{1.73_m2} (ref >59)

## 2023-01-28 LAB — TSH: TSH: 2.19 u[IU]/mL (ref 0.450–4.500)

## 2023-01-28 NOTE — Telephone Encounter (Signed)
[facility]: CERTIFICATION PERIOD 01/15/2023 TO 03/15/2023

## 2023-01-29 ENCOUNTER — Encounter: Payer: Self-pay | Admitting: Family Medicine

## 2023-01-29 DIAGNOSIS — H6122 Impacted cerumen, left ear: Secondary | ICD-10-CM | POA: Insufficient documentation

## 2023-01-29 NOTE — Assessment & Plan Note (Signed)
Advancing. Medications: did not help so were discontinued.

## 2023-01-29 NOTE — Assessment & Plan Note (Addendum)
Recommend eat 3 meals per day.  Continue mirtazepine 45 mg before bed.

## 2023-01-29 NOTE — Assessment & Plan Note (Signed)
Patient on eliquis

## 2023-01-29 NOTE — Assessment & Plan Note (Addendum)
Continue remeron.  Patient is unhappy with her situation, but she and her daughter do not believe she needs more medicine.

## 2023-01-29 NOTE — Assessment & Plan Note (Signed)
Well controlled.  No changes to medicines. Acetabutolol 200 mg every other day. Continue to work on eating a healthy diet and exercise.  Labs drawn today.

## 2023-01-29 NOTE — Assessment & Plan Note (Signed)
Left cerumen irrigation successful.

## 2023-01-29 NOTE — Assessment & Plan Note (Addendum)
Recommend continue to work on eating healthy diet and exercise.  

## 2023-01-30 ENCOUNTER — Encounter: Payer: Self-pay | Admitting: Family Medicine

## 2023-02-10 ENCOUNTER — Encounter: Payer: Self-pay | Admitting: Neurology

## 2023-02-10 ENCOUNTER — Other Ambulatory Visit: Payer: Self-pay

## 2023-02-10 ENCOUNTER — Ambulatory Visit: Payer: PPO | Admitting: Neurology

## 2023-02-10 VITALS — BP 100/62 | HR 91 | Ht 62.0 in | Wt 89.6 lb

## 2023-02-10 DIAGNOSIS — F03918 Unspecified dementia, unspecified severity, with other behavioral disturbance: Secondary | ICD-10-CM

## 2023-02-10 NOTE — Progress Notes (Signed)
NEUROLOGY FOLLOW UP OFFICE NOTE  [name] [id number] Mar 12, 1926  HISTORY OF PRESENT ILLNESS: I had the pleasure of seeing [name] in follow-up in the neurology clinic on 02/10/2023.  The patient was last seen 7 months ago for dementia. She is again accompanied by her daughter [name] who helps supplement the history today.  Records and images were personally reviewed where available. Since her last visit, [name] had contacted our office about continued decline last 10/2022 with more behavioral changes. She has lost her sense of time, sleeping during the day and awake at night, she gets up at 11pm and gets dressed, makes her bed, and moves without her walker, leading to falls. She has become increasingly agitated and irritable,quickly becoming angry and almost to rage. Mirtazapine dose increased to 45mg  qhs. She is also on Depakote 125mg : 1 cap in AM, 4 caps in PM. [name] reports that she is sleeping better and there have been at least 2 months now with no falls. Lately she has been saying she is very unhappy because she cannot go home. Her time is different from everyone else, she wants to go to bed in the daytime and they try to distract her but she is hard to distract. She states "I don't remember that." She lives at [facility] in [place] but joins very little activities. She is not particularly interested, even in things she used to enjoy. She has a Engineer, site trying to keep her moving. She ambulates with a walker. She denies any headaches, dizziness, focal numbness/tingling/weakness.    PAST MEDICAL HISTORY: Past Medical History:  Diagnosis Date   Acute ischemic stroke 11/08/2018   left postcentral gyri; left frontal   Adjustment disorder with depressed mood 10/16/2019   Alteration consciousness 03/08/2020   Apraxia following cerebrovascular accident 10/16/2019   B12 deficiency 10/16/2019   Bilateral impacted cerumen 04/02/2021   Body mass index (BMI) less than 19  02/16/2020   Chronic kidney disease, stage II (mild) 12/06/2019   Closed fracture of one rib of right side with routine healing 06/30/2017   Confusion    COPD (chronic obstructive pulmonary disease) 11/16/2017   Current use of long term anticoagulation 07/21/2017   Essential hypertension, benign 10/16/2019   First degree atrioventricular block 06/26/2017   Gait abnormality 09/24/2020   Hammer toe 06/26/2017   Hearing loss    Hyperlipidemia 06/26/2017   Laceration of right lower leg 06/18/2020   Major neurocognitive disorder due to Alzheimer's disease 08/16/2021   Malnutrition of moderate degree 12/06/2019   Memory loss    Other specified cardiac arrhythmias 06/26/2017   Pain in limb 06/26/2017   Palpitations 06/26/2017   Paroxysmal atrial fibrillation 06/26/2017   remote history post operatively, CHADS 2 score=2   Premature atrial complex 06/26/2017   Premature ventricular contractions 06/26/2017   PSVT (paroxysmal supraventricular tachycardia) 11/10/2017   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 45 capsule 3   acetaminophen (TYLENOL) 325 MG tablet Take 325 mg by mouth daily.     apixaban (ELIQUIS) 2.5 MG TABS tablet TAKE 1 TABLET(2.5 MG) BY MOUTH TWICE DAILY 180 tablet 1   divalproex (DEPAKOTE SPRINKLE) 125 MG capsule Take 1 capsule in AM, 4 capsules in PM 450 capsule  3   docusate sodium (COLACE) 100 MG capsule Take 100 mg by mouth 2 (two) times daily.     midodrine (PROAMATINE) 5 MG tablet Take 1 tablet (5 mg total) by mouth 3 (three) times daily with meals. 270 tablet 1   mirtazapine (REMERON) 45 MG tablet Take 1 tablet every night 90 tablet 3   Multiple Vitamins-Minerals (ALIVE WOMENS  50+ GUMMY) CHEW Chew by mouth.     pantoprazole (PROTONIX) 40 MG tablet Take 40 mg by mouth daily.     polyethylene glycol (MIRALAX / GLYCOLAX) 17 g packet Take 8.5 g by mouth daily. 1 each 0   No current facility-administered medications on file prior to visit.    ALLERGIES: Allergies  Allergen Reactions   Codeine Nausea And Vomiting   Penicillins Rash   Sulfa Antibiotics Nausea And Vomiting    FAMILY HISTORY: Family History  Problem Relation Age of Onset   Bone cancer Mother    Breast cancer Father    Diabetes Father    Heart disease Father    Dementia Sister    Breast cancer Sister    Alzheimer's disease Sister    Dementia Brother     SOCIAL HISTORY: Social History   Socioeconomic History   Marital status: Widowed    Spouse name: Not on file   Number of children: 2   Years of education: 32   Highest education level: 12th grade  Occupational History   Occupation: Retired  Tobacco Use   Smoking status: Never    Passive exposure: Past   Smokeless tobacco: Never  Vaping Use   Vaping Use: Never used  Substance and Sexual Activity   Alcohol use: No   Drug use: No   Sexual activity: Not Currently  Other Topics Concern   Not on file  Social History Narrative   [name] is widowed. Her daughter, [name], is her POA and lives 12 miles away. She resides in her own home, alone for last 7 years. Since her CVA she has had someone with her most of the time. She has a hired caregiver or a family member stays most of the day. She feel safe in her home and in her neighborhood.   Right-handed.   0.5 cups caffeine per day.   One story home   Social Determinants of Health   Financial Resource Strain: Low Risk  (01/23/2023)   Overall Financial Resource Strain (CARDIA)    Difficulty of Paying Living Expenses: Not hard at all  Food Insecurity: No Food Insecurity (01/23/2023)   Hunger Vital Sign    Worried About Running Out of Food in the Last Year: Never true    Ran  Out of Food in the Last Year: Never true  Transportation Needs: No Transportation Needs (01/23/2023)   PRAPARE - Administrator, Civil Service (Medical): No    Lack of Transportation (Non-Medical): No  Physical Activity: Unknown (01/23/2023)   Exercise Vital Sign    Days of Exercise per Week: 0 days    Minutes of Exercise per Session: Not on file  Stress: Stress Concern Present (01/23/2023)   [employer] of Occupational Health - Occupational Stress Questionnaire    Feeling of Stress : To some extent  Social Connections: Moderately Isolated (01/23/2023)   Social Connection and Isolation Panel [NHANES]    Frequency of Communication with Friends and Family: More than three times a week    Frequency of Social Gatherings with Friends and Family: Twice a week  Attends Religious Services: 1 to 4 times per year    Active Member of Clubs or Organizations: No    Attends Banker Meetings: Not on file    Marital Status: Widowed  Intimate Partner Violence: Not on file     PHYSICAL EXAM: Vitals:   02/10/23 1402  BP: 100/62  Pulse: 91  SpO2: 99%   General: No acute distress Head:  Normocephalic/atraumatic Skin/Extremities: No rash, no edema Neurological Exam: alert and awake. No aphasia or dysarthria. Fund of knowledge is reduced.  Recent and remote memory are impaired, 0/3 delayed recall. Attention and concentration are normal, 5/5 WORLD backward but cannot do serial 7s (100-7=3). She is able to name, read, follow instructions. Cranial nerves: Pupils equal, round. Extraocular movements intact with no nystagmus. Visual fields full.  No facial asymmetry.  Motor: Bulk and tone normal, muscle strength 5/5 throughout with no pronator drift.   Finger to nose testing intact.  Gait hunched posture, slow and cautious with walker, no ataxia. No tremor   IMPRESSION: This is a pleasant 89 yo RH woman with atrial fibrillation on Eliquis, hyperlipidemia, stroke in 2020, with  mixed Alzheimer's and vascular dementia on Neuropsychological evaluation done 08/2021. She is on Memory Care. She was having more behavioral and sleep changes, sleep issues improved with Remeron 45mg  qhs. She continues to have increased irritability and depression, we discussed option to increase evening dose of Depakote, however due to potential side effects, agreed to stay on Depakote 125mg : 1 cap in AM, 4 caps in PM. No loss of consciousness since 2021. Continue 24/7 care. Follow-up in 6 months, call for any changes.    Thank you for allowing me to participate in her care.  Please do not hesitate to call for any questions or concerns.    Patrcia Dolly, M.D.   CC: Dr. Sedalia Muta

## 2023-02-10 NOTE — Telephone Encounter (Signed)
Micah called for clarification on an order that was written on 12/18/2022.   Mrs. Frankson had a fall on 12/17/2022 when she was up to the bathroom with her walker.  She had a small skin tear that has healed without problems.  Dr. Sedalia Muta had advised fall precautions and skin care for the abrasion.  They are now concerned about the specifics of fall precautions.  She is in the memory unit and has a sitter during the day until 3 pm.  Physical therapy is currently working with her.  She uses a walker when up .   They are going to refax over a form for Dr. Sedalia Muta to sign about the specifics of fall precautions.

## 2023-02-10 NOTE — Patient Instructions (Signed)
Good to see you. Continue all your medications. Follow-up in 6 months, call for any changes   FALL PRECAUTIONS: Be cautious when walking. Scan the area for obstacles that may increase the risk of trips and falls. When getting up in the mornings, sit up at the edge of the bed for a few minutes before getting out of bed. Consider elevating the bed at the head end to avoid drop of blood pressure when getting up. Walk always in a well-lit room (use night lights in the walls). Avoid area rugs or power cords from appliances in the middle of the walkways. Use a walker or a cane if necessary and consider physical therapy for balance exercise. Get your eyesight checked regularly.   RECOMMENDATIONS FOR ALL PATIENTS WITH MEMORY PROBLEMS: 1. Continue to exercise (Recommend 30 minutes of walking everyday, or 3 hours every week) 2. Increase social interactions - continue going to Church and enjoy social gatherings with friends and family 3. Eat healthy, avoid fried foods and eat more fruits and vegetables 4. Maintain adequate blood pressure, blood sugar, and blood cholesterol level. Reducing the risk of stroke and cardiovascular disease also helps promoting better memory. 5. Avoid stressful situations. Live a simple life and avoid aggravations. Organize your time and prepare for the next day in anticipation. 6. Sleep well, avoid any interruptions of sleep and avoid any distractions in the bedroom that may interfere with adequate sleep quality 7. Avoid sugar, avoid sweets as there is a strong link between excessive sugar intake, diabetes, and cognitive impairment The Mediterranean diet has been shown to help patients reduce the risk of progressive memory disorders and reduces cardiovascular risk. This includes eating fish, eat fruits and green leafy vegetables, nuts like almonds and hazelnuts, walnuts, and also use olive oil. Avoid fast foods and fried foods as much as possible. Avoid sweets and sugar as sugar use  has been linked to worsening of memory function.  There is always a concern of gradual progression of memory problems. If this is the case, then we may need to adjust level of care according to patient needs. Support, both to the patient and caregiver, should then be put into place.  

## 2023-02-11 ENCOUNTER — Encounter: Payer: Self-pay | Admitting: Neurology

## 2023-02-12 ENCOUNTER — Telehealth: Payer: Self-pay | Admitting: Family Medicine

## 2023-02-12 ENCOUNTER — Ambulatory Visit: Payer: PPO | Admitting: Cardiology

## 2023-02-12 NOTE — Telephone Encounter (Signed)
Terrabella  forms 02/12/23

## 2023-02-18 ENCOUNTER — Telehealth: Payer: Self-pay | Admitting: *Deleted

## 2023-02-18 NOTE — Patient Outreach (Signed)
  Care Coordination   Initial Visit Note   02/18/2023 Name: [name] MRN: [id number] DOB: Dec 30, 1925  [name] is a 89 y.o. year old female who sees Cox, Fritzi Mandes, MD for primary care. I  spoke with pt's daughter, POA  What matters to the patients health and wellness today?  Daughter reports they are coordinating long term placement for pt at [facility]. Care Coordination services offered and they will request through PCP if needs arise.     Goals Addressed   None     SDOH assessments and interventions completed:  No     Care Coordination Interventions:  No, not indicated   Follow up plan: No further intervention required.   Encounter Outcome:  Pt. Visit Completed

## 2023-02-19 NOTE — Progress Notes (Addendum)
Cardiology Office Note:    Date:  02/20/2023   ID:  [name], DOB [birth year], MRN [id number]  PCP:  Blane Ohara, MD   [facility] Providers Cardiologist:  None     Referring MD: Janie Morning, NP   CC: follow up hypotension  History of Present Illness:    [name] is a 89 y.o. female with a hx of neurogenic orthostatic hypotension, hypertension, PSVT, first-degree AV block, CVA, PAF anticoagulated on Eliquis CHA2DS2-VASc score of 7, thoracic aortic atherosclerosis, PVCs, PACs, COPD, Alzheimer's disease, CKD, acquired thrombophilia, hyperlipidemia.  Monitor in September of 2021 predominant SR, no pauses > 3 seconds. Two instances of Mobitz 1, rare VE, SVT burden > 7% with frequent runs of APCs, no afib or flutter Echo in March 2020 echo 55-60%, mild thickening of MV leaflet, mild mitral annular calcification, mod TR, mild calcification of AV  Most recently she was evaluated by Dr. Dulce Sellar on 08/19/2022, the decision was to increase her midodrine to have 3 times daily.  She presents today accompanied by her daughter for follow-up of her hypotension and PAF.  She offers no complaints.  Her daughter states that she has had 8 falls over the last 6 months, and frequent trips to the emergency department for evaluation of falls.  She denies hematochezia, hematuria, hemoptysis. She denies chest pain, palpitations, dyspnea, pnd, orthopnea, n, v, dizziness, syncope, edema, weight gain, or early satiety.   Past Medical History:  Diagnosis Date   Acute ischemic stroke 11/08/2018   left postcentral gyri; left frontal   Adjustment disorder with depressed mood 10/16/2019   Alteration consciousness 03/08/2020   Apraxia following cerebrovascular accident 10/16/2019   B12 deficiency 10/16/2019   Bilateral impacted cerumen 04/02/2021   Body mass index (BMI) less than 19 02/16/2020   Chronic kidney disease, stage II (mild) 12/06/2019   Closed fracture of one rib of right  side with routine healing 06/30/2017   Confusion    COPD (chronic obstructive pulmonary disease) 11/16/2017   Current use of long term anticoagulation 07/21/2017   Essential hypertension, benign 10/16/2019   First degree atrioventricular block 06/26/2017   Gait abnormality 09/24/2020   Hammer toe 06/26/2017   Hearing loss    Hyperlipidemia 06/26/2017   Laceration of right lower leg 06/18/2020   Major neurocognitive disorder due to Alzheimer's disease 08/16/2021   Malnutrition of moderate degree 12/06/2019   Memory loss    Other specified cardiac arrhythmias 06/26/2017   Pain in limb 06/26/2017   Palpitations 06/26/2017   Paroxysmal atrial fibrillation 06/26/2017   remote history post operatively, CHADS 2 score=2   Premature atrial complex 06/26/2017   Premature ventricular contractions 06/26/2017   PSVT (paroxysmal supraventricular tachycardia) 11/10/2017   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   acetaminophen (TYLENOL) 325 MG tablet Take 325 mg by mouth as needed.   apixaban (ELIQUIS) 2.5 MG TABS tablet TAKE 1 TABLET(2.5 MG) BY MOUTH TWICE DAILY   divalproex (DEPAKOTE SPRINKLE) 125 MG capsule Take 1 capsule in AM, 4 capsules in PM   docusate sodium (COLACE) 100  MG capsule Take 100 mg by mouth 2 (two) times daily.   midodrine (PROAMATINE) 5 MG tablet Take 1 tablet (5 mg total) by mouth 3 (three) times daily with meals.   mirtazapine (REMERON) 45 MG tablet Take 1 tablet every night   Multiple Vitamins-Minerals (ALIVE WOMENS 50+ GUMMY) CHEW Chew by mouth.   pantoprazole (PROTONIX) 40 MG  tablet Take 40 mg by mouth daily.   polyethylene glycol (MIRALAX / GLYCOLAX) 17 g packet Take 8.5 g by mouth daily.     Allergies:   Codeine, Penicillins, and Sulfa antibiotics   Social History   Socioeconomic History   Marital status: Widowed    Spouse name: Not on file   Number of children: 2   Years of education: 62   Highest education level: 12th grade  Occupational History   Occupation: Retired  Tobacco Use   Smoking status: Never    Passive exposure: Past   Smokeless tobacco: Never  Vaping Use   Vaping Use: Never used  Substance and Sexual Activity   Alcohol use: No   Drug use: No   Sexual activity: Not Currently  Other Topics Concern   Not on file  Social History Narrative   [name] is widowed. Her daughter, [name], is her POA and lives 12 miles away. She resides in her own home, alone for last 7 years. Since her CVA she has had someone with her most of the time. She has a hired caregiver or a family member stays most of the day. She feel safe in her home and in her neighborhood.   Right-handed.   0.5 cups caffeine per day.   One story home      Lives at [facility] [phone]   Social Determinants of Health   Financial Resource Strain: Low Risk  (01/23/2023)   Overall Financial Resource Strain (CARDIA)    Difficulty of Paying Living Expenses: Not hard at all  Food Insecurity: No Food Insecurity (01/23/2023)   Hunger Vital Sign    Worried About Running Out of Food in the Last Year: Never true    Ran Out of Food in the Last Year: Never true  Transportation Needs: No Transportation Needs (01/23/2023)   PRAPARE - Administrator, Civil Service (Medical): No    Lack of Transportation (Non-Medical): No  Physical Activity: Unknown (01/23/2023)   Exercise Vital Sign    Days of Exercise per Week: 0 days    Minutes of Exercise per Session: Not on file  Stress: Stress Concern Present (01/23/2023)   [employer] of Occupational Health -  Occupational Stress Questionnaire    Feeling of Stress : To some extent  Social Connections: Moderately Isolated (01/23/2023)   Social Connection and Isolation Panel [NHANES]    Frequency of Communication with Friends and Family: More than three times a week    Frequency of Social Gatherings with Friends and Family: Twice a week    Attends Religious Services: 1 to 4 times per year    Active Member of [employer] or Organizations: No    Attends Banker Meetings: Not on file    Marital Status: Widowed     Family History: The patient's family history includes Alzheimer's disease in her sister; Bone cancer in her mother; Breast cancer in her father and sister; Dementia in her brother and sister; Diabetes in her father; Heart disease in her father.  ROS:   Please see the history of present illness.  All other systems reviewed and are negative.  EKGs/Labs/Other Studies Reviewed:    The following studies were reviewed today: Cardiac Studies & Procedures       ECHOCARDIOGRAM  ECHOCARDIOGRAM COMPLETE 11/09/2018  Narrative ECHOCARDIOGRAM REPORT    Patient Name:   JACARI KIRSTEN Date of Exam: 11/09/2018 Medical Rec #:  [id number]       Height:       63.0 in Accession #:    [id number]      Weight:       104.1 lb Date of Birth:  02-08-1926       BSA:          1.46 m Patient Age:    89 years        BP:           135/85 mmHg Patient Gender: F               HR:           92 bpm. Exam Location:  Inpatient   Procedure: 2D Echo  Indications:    stroke 434.91  History:        Patient has no prior history of Echocardiogram examinations. Atrial Fibrillation.  Sonographer:    Delcie Roch Referring Phys: [id number] ASHISH ARORA  IMPRESSIONS   1. The left ventricle has normal systolic function, with an ejection fraction of 55-60%. The cavity size was normal. Left ventricular diastolic function could not be evaluated secondary to atrial fibrillation. 2. Left atrial size was  moderately dilated. 3. Right atrial size was mildly dilated. 4. Mild thickening of the mitral valve leaflet. There is mild mitral annular calcification present. 5. Tricuspid valve regurgitation is moderate. 6. The aortic valve is tricuspid Mild calcification of the aortic valve. 7. Normal LV function; biatrial enlargement; mild MR; moderate TR; mildly elevated pulmonary pressure.  FINDINGS Left Ventricle: The left ventricle has normal systolic function, with an ejection fraction of 55-60%. The cavity size was normal. There is no increase in left ventricular wall thickness. Left ventricular diastolic function could not be evaluated secondary to atrial fibrillation. Right Ventricle: The right ventricle has normal systolic function. The cavity was normal. Right ventricular systolic pressure is mildly elevated with an estimated pressure of 35.3 mmHg. Left Atrium: left atrial size was moderately dilated Right Atrium: right atrial size was mildly dilated. Right atrial pressure is estimated at 3 mmHg. Interatrial Septum: No atrial level shunt detected by color flow Doppler. Pericardium: There is no evidence of pericardial effusion. Mitral Valve: The mitral valve is normal in structure. Mild thickening of the mitral valve leaflet. There is mild mitral annular calcification present. Mitral valve regurgitation is mild by color flow Doppler. Tricuspid Valve: The tricuspid valve is normal in structure. Tricuspid valve regurgitation is moderate by color flow Doppler. Aortic Valve: The aortic valve is tricuspid Mild calcification of the aortic valve. Aortic valve regurgitation was not visualized by color flow Doppler. Pulmonic Valve: The pulmonic valve was grossly normal. Pulmonic valve regurgitation is trivial by color flow Doppler. Venous: The inferior vena cava is normal in size with greater than 50% respiratory variability.  LEFT VENTRICLE PLAX 2D LVIDd:         4.00 cm LVIDs:         2.70 cm LV PW:          0.60 cm LV IVS:        0.70 cm LVOT diam:     2.00 cm LV SV:  43 ml LV SV Index:   29.81 LVOT Area:     3.14 cm  RIGHT VENTRICLE TAPSE (M-mode): 1.2 cm RVSP:           35.3 mmHg  LEFT ATRIUM             Index       RIGHT ATRIUM           Index LA diam:        4.00 cm 2.73 cm/m  RA Pressure: 3 mmHg LA Vol (A2C):   53.0 ml 36.18 ml/m RA Area:     17.00 cm LA Vol (A4C):   55.8 ml 38.09 ml/m RA Volume:   45.50 ml  31.06 ml/m LA Biplane Vol: 58.1 ml 39.66 ml/m AORTIC VALVE LVOT Vmax:   74.90 cm/s LVOT Vmean:  51.000 cm/s LVOT VTI:    0.157 m  AORTA Ao Root diam: 3.10 cm  TRICUSPID VALVE TR Peak grad:   32.3 mmHg TR Vmax:        284.00 cm/s RVSP:           35.3 mmHg  SHUNTS Systemic VTI:  0.16 m Systemic Diam: 2.00 cm   Olga Millers MD Electronically signed by Olga Millers MD Signature Date/Time: 11/09/2018/5:26:04 PM    Final    MONITORS  LONG TERM MONITOR (3-14 DAYS) 05/19/2020  Narrative A ZIO monitor was performed 13 days 21 hours beginning 04/24/2020 to assess for atrial fibrillation.  Predominant cardiac rhythm with sinus with average, minimum and maximum heart rates of 73, 53 and 121 bpm.  There were no pauses of 3 seconds or greater and no episodes of Mobitz 2 second or third-degree AV nodal block or sinus node exit block.  2 instances of Mobitz 1 second-degree AV block were noted.  Ventricular ectopy was rare with isolated PVCs and couplets and one 4 beat run of regular premature contractions.  Supraventricular ectopy was frequent with a burden exceeding 7% with frequent runs of atrial premature contraction longest 9 complexes a rate of 100 bpm.  There were no episodes of atrial fibrillation or flutter.  There were no triggered or diary events.   Conclusion, frequent supraventricular ectopy but no episodes of atrial fibrillation or flutter.  Rare episodes of Mobitz 1 second-degree AV block were noted, total of 2.                EKG today, 1st AV block, HR 95 bpm Recent Labs: 01/27/2023: ALT 5; BUN 24; Creatinine, Ser 0.71; Hemoglobin 11.8; Platelets 260; Potassium 4.9; Sodium 140; TSH 2.190  Recent Lipid Panel    Component Value Date/Time   CHOL 204 (H) 10/01/2022 1049   TRIG 113 10/01/2022 1049   HDL 46 10/01/2022 1049   CHOLHDL 4.4 10/01/2022 1049   CHOLHDL 4.3 11/09/2018 0512   VLDL 11 11/09/2018 0512   LDLCALC 138 (H) 10/01/2022 1049     Risk Assessment/Calculations:    CHA2DS2-VASc Score = 7   This indicates a 11.2% annual risk of stroke. The patient's score is based upon: CHF History: 0 HTN History: 1 Diabetes History: 0 Stroke History: 2 Vascular Disease History: 1 Age Score: 2 Gender Score: 1               Physical Exam:    VS:  BP 100/70 (BP Location: Right Arm, Patient Position: Sitting, Cuff Size: Small)   Pulse 95   Ht 5\' 2"  (1.575 m)   Wt 90 lb (40.8 kg)   LMP  (  LMP Unknown)   SpO2 97%   BMI 16.46 kg/m     Wt Readings from Last 3 Encounters:  02/20/23 90 lb (40.8 kg)  02/10/23 89 lb 9.6 oz (40.6 kg)  01/27/23 88 lb (39.9 kg)     GEN: Frail, cachectic, no acute distress HEENT: Normal NECK: No JVD; No carotid bruits LYMPHATICS: No lymphadenopathy CARDIAC: RRR, no murmurs, rubs, gallops RESPIRATORY:  Clear to auscultation without rales, wheezing or rhonchi  ABDOMEN: Soft, non-tender, non-distended MUSCULOSKELETAL:  No edema; No deformity  SKIN: Warm and dry NEUROLOGIC:  Alert and oriented x 3 PSYCHIATRIC:  Normal affect   ASSESSMENT:    1. Paroxysmal atrial fibrillation (HCC)   2. Neurogenic orthostatic hypotension (HCC)   3. PAT (paroxysmal atrial tachycardia)   4. Falls frequently    PLAN:    In order of problems listed above:  PAF-currently on reduced dose Eliquis 2.5 mg twice daily based on age and weight, CHA2DS2-VASc score of 7.  She has had frequent falls, although her daughter would prefer that she continue her Eliquis.  Previously when her  Eliquis was stopped, she suffered an ischemic stroke and this is more devastating to the family than the potential that she could fall and have a catastrophic hemorrhage.  Will discuss with primary cardiologist to see if they are agreeable to continue.  On 01/27/2023 hemoglobin 11.8, hematocrit 35.4, renin 0.71. Neurogenic orthostatic hypotension-continue midodrine 5 mg 3 times daily, they state her blood pressure has been predominantly 100/60 and she seems to be doing better with this. Paroxysmal atrial tachycardia-quiescent. Frequent falls-approximately 8 falls within the last 6 months.   Disposition-follow-up in 6 months, they would prefer to follow-up in a year because it is difficult to bring [name] back and forth to doctors appointments.  Advised they could call in 6 months and if they have had recent blood work that is needed for Eliquis and she is doing well the appointment can be postponed.        Medication Adjustments/Labs and Tests Ordered: Current medicines are reviewed at length with the patient today.  Concerns regarding medicines are outlined above.  Orders Placed This Encounter  Procedures   EKG 12-Lead   No orders of the defined types were placed in this encounter.   Patient Instructions  Medication Instructions:  Your physician recommends that you continue on your current medications as directed. Please refer to the Current Medication list given to you today.  *If you need a refill on your cardiac medications before your next appointment, please call your pharmacy*   Lab Work: None If you have labs (blood work) drawn today and your tests are completely normal, you will receive your results only by: MyChart Message (if you have MyChart) OR A paper copy in the mail If you have any lab test that is abnormal or we need to change your treatment, we will call you to review the results.   Testing/Procedures: None   Follow-Up: At [facility], you and  your health needs are our priority.  As part of our continuing mission to provide you with exceptional heart care, we have created designated Provider Care Teams.  These Care Teams include your primary Cardiologist (physician) and Advanced Practice Providers (APPs -  Physician Assistants and Nurse Practitioners) who all work together to provide you with the care you need, when you need it.  We recommend signing up for the patient portal called "MyChart".  Sign up information is provided on this After  Visit Summary.  MyChart is used to connect with patients for Virtual Visits (Telemedicine).  Patients are able to view lab/test results, encounter notes, upcoming appointments, etc.  Non-urgent messages can be sent to your provider as well.   To learn more about what you can do with MyChart, go to [email or web address].    Your next appointment:   6 month(s)  Provider:   Wallis Bamberg, NP [facility])    Other Instructions None    Signed, Flossie Dibble, NP  02/20/2023 12:02 PM    [facility]

## 2023-02-20 ENCOUNTER — Encounter: Payer: Self-pay | Admitting: Cardiology

## 2023-02-20 ENCOUNTER — Ambulatory Visit: Payer: PPO | Attending: Cardiology | Admitting: Cardiology

## 2023-02-20 VITALS — BP 100/70 | HR 95 | Ht 62.0 in | Wt 90.0 lb

## 2023-02-20 DIAGNOSIS — R296 Repeated falls: Secondary | ICD-10-CM

## 2023-02-20 DIAGNOSIS — I480 Paroxysmal atrial fibrillation: Secondary | ICD-10-CM | POA: Diagnosis not present

## 2023-02-20 DIAGNOSIS — I4719 Other supraventricular tachycardia: Secondary | ICD-10-CM

## 2023-02-20 DIAGNOSIS — G903 Multi-system degeneration of the autonomic nervous system: Secondary | ICD-10-CM

## 2023-02-20 NOTE — Patient Instructions (Signed)
Medication Instructions:  Your physician recommends that you continue on your current medications as directed. Please refer to the Current Medication list given to you today.  *If you need a refill on your cardiac medications before your next appointment, please call your pharmacy*   Lab Work: None If you have labs (blood work) drawn today and your tests are completely normal, you will receive your results only by: MyChart Message (if you have MyChart) OR A paper copy in the mail If you have any lab test that is abnormal or we need to change your treatment, we will call you to review the results.   Testing/Procedures: None   Follow-Up: At [facility], you and your health needs are our priority.  As part of our continuing mission to provide you with exceptional heart care, we have created designated Provider Care Teams.  These Care Teams include your primary Cardiologist (physician) and Advanced Practice Providers (APPs -  Physician Assistants and Nurse Practitioners) who all work together to provide you with the care you need, when you need it.  We recommend signing up for the patient portal called "MyChart".  Sign up information is provided on this After Visit Summary.  MyChart is used to connect with patients for Virtual Visits (Telemedicine).  Patients are able to view lab/test results, encounter notes, upcoming appointments, etc.  Non-urgent messages can be sent to your provider as well.   To learn more about what you can do with MyChart, go to [email or web address].    Your next appointment:   6 month(s)  Provider:   Jennifer Woody, NP ([place])    Other Instructions None  

## 2023-02-23 ENCOUNTER — Telehealth: Payer: Self-pay

## 2023-02-23 NOTE — Telephone Encounter (Signed)
Spoke with pts daughter [name] per DPR-Per Victorino Dike Woody's note. Daughter agreed and had no further questions.

## 2023-02-25 ENCOUNTER — Ambulatory Visit: Payer: PPO | Admitting: Cardiology

## 2023-02-25 DIAGNOSIS — E46 Unspecified protein-calorie malnutrition: Secondary | ICD-10-CM | POA: Diagnosis not present

## 2023-02-25 DIAGNOSIS — M6281 Muscle weakness (generalized): Secondary | ICD-10-CM | POA: Diagnosis not present

## 2023-02-25 DIAGNOSIS — F03B0 Unspecified dementia, moderate, without behavioral disturbance, psychotic disturbance, mood disturbance, and anxiety: Secondary | ICD-10-CM | POA: Diagnosis not present

## 2023-03-03 DIAGNOSIS — I7091 Generalized atherosclerosis: Secondary | ICD-10-CM | POA: Diagnosis not present

## 2023-03-03 DIAGNOSIS — B351 Tinea unguium: Secondary | ICD-10-CM | POA: Diagnosis not present

## 2023-03-04 DIAGNOSIS — F03B0 Unspecified dementia, moderate, without behavioral disturbance, psychotic disturbance, mood disturbance, and anxiety: Secondary | ICD-10-CM | POA: Diagnosis not present

## 2023-03-04 DIAGNOSIS — G4700 Insomnia, unspecified: Secondary | ICD-10-CM | POA: Diagnosis not present

## 2023-03-11 DIAGNOSIS — F03B0 Unspecified dementia, moderate, without behavioral disturbance, psychotic disturbance, mood disturbance, and anxiety: Secondary | ICD-10-CM | POA: Diagnosis not present

## 2023-03-11 DIAGNOSIS — G4700 Insomnia, unspecified: Secondary | ICD-10-CM | POA: Diagnosis not present

## 2023-03-23 ENCOUNTER — Other Ambulatory Visit: Payer: Self-pay | Admitting: Cardiology

## 2023-04-01 DIAGNOSIS — H04123 Dry eye syndrome of bilateral lacrimal glands: Secondary | ICD-10-CM | POA: Diagnosis not present

## 2023-04-01 DIAGNOSIS — K648 Other hemorrhoids: Secondary | ICD-10-CM | POA: Diagnosis not present

## 2023-04-01 DIAGNOSIS — S61412A Laceration without foreign body of left hand, initial encounter: Secondary | ICD-10-CM | POA: Diagnosis not present

## 2023-04-06 ENCOUNTER — Encounter: Payer: Self-pay | Admitting: Neurology

## 2023-04-08 DIAGNOSIS — K649 Unspecified hemorrhoids: Secondary | ICD-10-CM | POA: Diagnosis not present

## 2023-04-15 ENCOUNTER — Encounter: Payer: Self-pay | Admitting: Cardiology

## 2023-04-29 DIAGNOSIS — N390 Urinary tract infection, site not specified: Secondary | ICD-10-CM | POA: Diagnosis not present

## 2023-04-29 DIAGNOSIS — F03B0 Unspecified dementia, moderate, without behavioral disturbance, psychotic disturbance, mood disturbance, and anxiety: Secondary | ICD-10-CM | POA: Diagnosis not present

## 2023-05-05 ENCOUNTER — Encounter: Payer: Self-pay | Admitting: Family Medicine

## 2023-05-08 DIAGNOSIS — M6281 Muscle weakness (generalized): Secondary | ICD-10-CM | POA: Diagnosis not present

## 2023-05-09 LAB — LAB REPORT - SCANNED: EGFR: 68.9

## 2023-05-21 ENCOUNTER — Ambulatory Visit: Payer: PPO

## 2023-05-22 ENCOUNTER — Ambulatory Visit (INDEPENDENT_AMBULATORY_CARE_PROVIDER_SITE_OTHER): Payer: PPO

## 2023-05-22 VITALS — BP 90/62 | HR 103 | Temp 97.8°F | Ht 62.0 in | Wt 92.0 lb

## 2023-05-22 DIAGNOSIS — Z23 Encounter for immunization: Secondary | ICD-10-CM

## 2023-05-22 DIAGNOSIS — N3001 Acute cystitis with hematuria: Secondary | ICD-10-CM | POA: Insufficient documentation

## 2023-05-22 DIAGNOSIS — R5383 Other fatigue: Secondary | ICD-10-CM

## 2023-05-22 DIAGNOSIS — R5381 Other malaise: Secondary | ICD-10-CM | POA: Insufficient documentation

## 2023-05-22 LAB — POCT URINALYSIS DIP (CLINITEK)
Bilirubin, UA: NEGATIVE
Glucose, UA: NEGATIVE mg/dL
Nitrite, UA: POSITIVE — AB
POC PROTEIN,UA: 30 — AB
Spec Grav, UA: 1.025 (ref 1.010–1.025)
Urobilinogen, UA: NEGATIVE E.U./dL — AB
pH, UA: 6 (ref 5.0–8.0)

## 2023-05-22 MED ORDER — NITROFURANTOIN MONOHYD MACRO 100 MG PO CAPS: 100.0000 mg | ORAL_CAPSULE | Freq: Two times a day (BID) | ORAL | 0 refills | Status: AC

## 2023-05-22 NOTE — Assessment & Plan Note (Signed)
[name] is here with her daughter to reestablish care but has a problem, complaints of dysuria, increased frequency of urination. She does have history of UTIs.  Her urinalysis today showed increased specific gravity, small blood, protein, nitrites and leukocyte esterase positive.  Plan: Will prescribe nitrofurantoin 100 mg twice daily for 7 days for possible UTI -She has excellent kidney function for her age with GFR in the 90s as of her latest blood work done on May 09, 2023. -Will send the urine for culture and sensitivity -Advised to increase her water intake -Report back if any worsening symptoms or call 911 for any severe symptoms

## 2023-05-22 NOTE — Assessment & Plan Note (Signed)
Could be multifactorial.  She is frail, elderly. She also reportedly had COVID couple weeks ago. She has a UTI today.  Hopefully her fatigue will improve with treating the infection with the course of antibiotic. Report back if it does not.

## 2023-05-22 NOTE — Patient Instructions (Signed)
[name] seems to have a urine infection today Will send the sample for culture Sent antibiotic MACROBID to take twice daily for 7 days Increase water intake Watch out for any worsening symptoms such as fever or pain Flu shot today No change in other medicines

## 2023-05-22 NOTE — Progress Notes (Signed)
Subjective:  Patient ID: [name], female    DOB: Jun 01, 1926  Age: 89 y.o. MRN: [id number]  Chief Complaint  Patient presents with   Re-establishing care    HPI  [name]'s daughter is the PRIMARY HISTORIAN due to her age and decreased mentation.  Patient is here today to re-establish her care. Patient's daughter is with her today and states she is concerned that the patient may have a UTI. She states that the patient has been very fatigued and sleeping a lot and is concerned.      05/22/2023   11:12 AM 01/27/2023   11:07 AM 10/29/2021    1:26 PM 03/28/2021    9:44 AM 09/25/2020   10:25 AM  Depression screen PHQ 2/9  Decreased Interest 0 3 1 0 1  Down, Depressed, Hopeless 0 3 1 0 1  PHQ - 2 Score 0 6 2 0 2  Altered sleeping 0 3 0  0  Tired, decreased energy 2 3 1  2   Change in appetite 1 3 3   0  Feeling bad or failure about yourself  0 0 1  0  Trouble concentrating 0 3 0    Moving slowly or fidgety/restless  3 0  2  Suicidal thoughts 0 -- 0  0  PHQ-9 Score 3 21 7  6   Difficult doing work/chores Not difficult at all Extremely dIfficult   Somewhat difficult        05/22/2023   11:12 AM  Fall Risk   Falls in the past year? 0  Number falls in past yr: 0  Injury with Fall? 0  Risk for fall due to : No Fall Risks  Follow up Falls evaluation completed    Patient Care Team: Windell Moment, MD as PCP - General (Family Medicine) Zigmund Daniel., MD as Consulting Physician (Family Medicine) Baldo Daub, MD as Consulting Physician (Cardiology) Van Clines, MD as Consulting Physician (Neurology)   Review of Systems  Constitutional:  Positive for fatigue. Negative for appetite change and fever.  HENT:  Negative for congestion, ear pain, sinus pressure and sore throat.   Respiratory:  Negative for cough, chest tightness, shortness of breath and wheezing.   Cardiovascular:  Negative for chest pain and palpitations.  Gastrointestinal:  Negative for abdominal  pain, constipation, diarrhea, nausea and vomiting.  Genitourinary:  Positive for dysuria and frequency. Negative for hematuria.  Musculoskeletal:  Negative for arthralgias, back pain, joint swelling and myalgias.  Skin:  Negative for rash.  Neurological:  Negative for dizziness, weakness and headaches.  Psychiatric/Behavioral:  Negative for dysphoric mood. The patient is not nervous/anxious.     Current Outpatient Medications on File Prior to Visit  Medication Sig Dispense Refill   acebutolol (SECTRAL) 200 MG capsule TAKE 1 CAPSULE BY MOUTH EVERY OTHER DAY 45 capsule 2   acetaminophen (TYLENOL) 325 MG tablet Take 325 mg by mouth as needed.     apixaban (ELIQUIS) 2.5 MG TABS tablet TAKE 1 TABLET(2.5 MG) BY MOUTH TWICE DAILY 180 tablet 1   divalproex (DEPAKOTE SPRINKLE) 125 MG capsule Take 1 capsule in AM, 4 capsules in PM 450 capsule 3   midodrine (PROAMATINE) 5 MG tablet Take 1 tablet (5 mg total) by mouth 3 (three) times daily with meals. 270 tablet 1   mirtazapine (REMERON) 45 MG tablet Take 1 tablet every night 90 tablet 3   Multiple Vitamins-Minerals (ALIVE WOMENS 50+ GUMMY) CHEW Chew by mouth.     pantoprazole (PROTONIX) 40  MG tablet Take 40 mg by mouth daily.     polyethylene glycol (MIRALAX / GLYCOLAX) 17 g packet Take 8.5 g by mouth daily. 1 each 0   No current facility-administered medications on file prior to visit.   Past Medical History:  Diagnosis Date   Acute ischemic stroke 11/08/2018   left postcentral gyri; left frontal   Adjustment disorder with depressed mood 10/16/2019   Alteration consciousness 03/08/2020   Apraxia following cerebrovascular accident 10/16/2019   B12 deficiency 10/16/2019   Bilateral impacted cerumen 04/02/2021   Body mass index (BMI) less than 19 02/16/2020   Chronic kidney disease, stage II (mild) 12/06/2019   Closed fracture of one rib of right side with routine healing 06/30/2017   Confusion    COPD (chronic obstructive pulmonary disease)  11/16/2017   Current use of long term anticoagulation 07/21/2017   Essential hypertension, benign 10/16/2019   First degree atrioventricular block 06/26/2017   Gait abnormality 09/24/2020   Hammer toe 06/26/2017   Hearing loss    Hyperlipidemia 06/26/2017   Laceration of right lower leg 06/18/2020   Major neurocognitive disorder due to Alzheimer's disease 08/16/2021   Malnutrition of moderate degree 12/06/2019   Memory loss    Other specified cardiac arrhythmias 06/26/2017   Pain in limb 06/26/2017   Palpitations 06/26/2017   Paroxysmal atrial fibrillation 06/26/2017   remote history post operatively, CHADS 2 score=2   Premature atrial complex 06/26/2017   Premature ventricular contractions 06/26/2017   PSVT (paroxysmal supraventricular tachycardia) 11/10/2017   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 10/16/2019   Thoracic aorta atherosclerosis 05/22/2020   Traumatic pneumothorax 06/30/2017   Past Surgical History:  Procedure Laterality Date   ABDOMINAL HYSTERECTOMY     HIP SURGERY  04/2022   SHOULDER SURGERY     TONSILLECTOMY      Family History  Problem Relation Age of Onset   Bone cancer Mother    Breast cancer Father    Diabetes Father    Heart disease Father    Dementia Sister    Breast cancer Sister    Alzheimer's disease Sister    Dementia Brother    Social History   Socioeconomic History   Marital status: Widowed    Spouse name: Not on file   Number of children: 2   Years of education: 65   Highest education level: 12th grade  Occupational History   Occupation: Retired  Tobacco Use   Smoking status: Never    Passive exposure: Past   Smokeless tobacco: Never  Vaping Use   Vaping status: Never Used  Substance and Sexual Activity   Alcohol use: No   Drug use: No   Sexual activity: Not Currently  Other Topics Concern   Not on file  Social History Narrative   [name] is widowed. Her daughter, [name], is her POA and lives 12 miles  away. She resides in her own home, alone for last 7 years. Since her CVA she has had someone with her most of the time. She has a hired caregiver or a family member stays most of the day. She feel safe in her home and in her neighborhood.   Right-handed.   0.5 cups caffeine per day.   One story home      Lives at [facility] [phone]   Social Determinants of Health   Financial Resource Strain: Low Risk  (01/23/2023)   Overall Financial Resource Strain (CARDIA)    Difficulty of Paying Living Expenses: Not hard  at all  Food Insecurity: No Food Insecurity (01/23/2023)   Hunger Vital Sign    Worried About Running Out of Food in the Last Year: Never true    Ran Out of Food in the Last Year: Never true  Transportation Needs: No Transportation Needs (01/23/2023)   PRAPARE - Administrator, Civil Service (Medical): No    Lack of Transportation (Non-Medical): No  Physical Activity: Unknown (01/23/2023)   Exercise Vital Sign    Days of Exercise per Week: 0 days    Minutes of Exercise per Session: Not on file  Stress: Stress Concern Present (01/23/2023)   [employer] of Occupational Health - Occupational Stress Questionnaire    Feeling of Stress : To some extent  Social Connections: Moderately Isolated (01/23/2023)   Social Connection and Isolation Panel [NHANES]    Frequency of Communication with Friends and Family: More than three times a week    Frequency of Social Gatherings with Friends and Family: Twice a week    Attends Religious Services: 1 to 4 times per year    Active Member of [employer] or Organizations: No    Attends Banker Meetings: Not on file    Marital Status: Widowed    Objective:  BP 90/62 (BP Location: Left Arm, Patient Position: Sitting)   Pulse (!) 103   Temp 97.8 F (36.6 C) (Oral)   Ht 5\' 2"  (1.575 m)   Wt 92 lb (41.7 kg)   LMP  (LMP Unknown)   SpO2 95%   BMI 16.83 kg/m      05/22/2023   11:09 AM 02/20/2023   11:02 AM  02/10/2023    2:02 PM  BP/Weight  Systolic BP 90 100 100  Diastolic BP 62 70 62  Wt. (Lbs) 92 90 89.6  BMI 16.83 kg/m2 16.46 kg/m2 16.39 kg/m2    Physical Exam Vitals and nursing note reviewed.  Constitutional:      Comments: Frail elderly woman, walking with walker  HENT:     Head: Normocephalic and atraumatic.     Mouth/Throat:     Mouth: Mucous membranes are moist.  Cardiovascular:     Rate and Rhythm: Normal rate and regular rhythm.  Pulmonary:     Effort: Pulmonary effort is normal.     Breath sounds: Normal breath sounds.  Abdominal:     Comments: No abdominal or flank tenderness  Neurological:     Mental Status: Mental status is at baseline.  Psychiatric:        Mood and Affect: Mood normal.     Diabetic Foot Exam - Simple   No data filed      Lab Results  Component Value Date   WBC 6.6 01/27/2023   HGB 11.8 01/27/2023   HCT 35.4 01/27/2023   PLT 260 01/27/2023   GLUCOSE 119 (H) 01/27/2023   CHOL 204 (H) 10/01/2022   TRIG 113 10/01/2022   HDL 46 10/01/2022   LDLCALC 138 (H) 10/01/2022   ALT 5 01/27/2023   AST 14 01/27/2023   NA 140 01/27/2023   K 4.9 01/27/2023   CL 100 01/27/2023   CREATININE 0.71 01/27/2023   BUN 24 01/27/2023   CO2 23 01/27/2023   TSH 2.190 01/27/2023   HGBA1C 5.3 11/09/2018      Assessment & Plan:    Malaise and fatigue Assessment & Plan: Could be multifactorial.  She is frail, elderly. She also reportedly had COVID couple weeks ago. She has a UTI today.  Hopefully her fatigue will improve with treating the infection with the course of antibiotic. Report back if it does not.  Orders: -     POCT URINALYSIS DIP (CLINITEK)  Acute cystitis with hematuria Assessment & Plan: Karen Kitchens is here with her daughter to reestablish care but has a problem, complaints of dysuria, increased frequency of urination. She does have history of UTIs.  Her urinalysis today showed increased specific gravity, small blood, protein,  nitrites and leukocyte esterase positive.  Plan: Will prescribe nitrofurantoin 100 mg twice daily for 7 days for possible UTI -She has excellent kidney function for her age with GFR in the 90s as of her latest blood work done on May 09, 2023. -Will send the urine for culture and sensitivity -Advised to increase her water intake -Report back if any worsening symptoms or call 911 for any severe symptoms   Orders: -     Urine Culture  Other orders -     Nitrofurantoin Monohyd Macro; Take 1 capsule (100 mg total) by mouth 2 (two) times daily for 7 days.  Dispense: 14 capsule; Refill: 0   Total time spent on today's visit was greater than 30 minutes, including both face-to-face time and nonface-to-face time personally spent on review of chart (labs and imaging), discussing labs and goals, discussing further work-up, treatment options, referrals to specialist if needed, reviewing outside records of pertinent, answering patient's questions, and coordinating care.   Meds ordered this encounter  Medications   nitrofurantoin, macrocrystal-monohydrate, (MACROBID) 100 MG capsule    Sig: Take 1 capsule (100 mg total) by mouth 2 (two) times daily for 7 days.    Dispense:  14 capsule    Refill:  0    Orders Placed This Encounter  Procedures   Urine Culture   POCT URINALYSIS DIP (CLINITEK)     Follow-up: Return in about 8 weeks (around 07/17/2023).   I,Lauren M Auman,acting as a Neurosurgeon for [employer], MD.,have documented all relevant documentation on the behalf of Windell Moment, MD,as directed by  Windell Moment, MD while in the presence of Windell Moment, MD.   An After Visit Summary was printed and given to the patient.  Windell Moment, MD [facility] [phone]

## 2023-05-26 LAB — URINE CULTURE

## 2023-06-02 ENCOUNTER — Ambulatory Visit: Payer: PPO | Admitting: Family Medicine

## 2023-06-19 ENCOUNTER — Encounter: Payer: Self-pay | Admitting: Family Medicine

## 2023-06-24 ENCOUNTER — Other Ambulatory Visit: Payer: Self-pay | Admitting: Cardiology

## 2023-06-24 NOTE — Telephone Encounter (Signed)
Prescription refill request for Eliquis received. Indication: AF Last office visit: 02/20/23  Anson [place] NP Scr: 0.79 on 05/08/23  Epic Age: 89 Weight: 40.8kg  Based on above findings Eliquis 2.5mg  twice daily is the appropriate dose.  Refill approved.

## 2023-06-25 ENCOUNTER — Ambulatory Visit (INDEPENDENT_AMBULATORY_CARE_PROVIDER_SITE_OTHER): Payer: PPO | Admitting: *Deleted

## 2023-06-25 DIAGNOSIS — Z0000 Encounter for general adult medical examination without abnormal findings: Secondary | ICD-10-CM

## 2023-06-25 NOTE — Patient Instructions (Signed)
[name] , Thank you for taking time to come for your Medicare Wellness Visit. I appreciate your ongoing commitment to your health goals. Please review the following plan we discussed and let me know if I can assist you in the future.   Screening recommendations/referrals: Colonoscopy: no longer required Mammogram: no longer required Bone Density: declined Recommended yearly ophthalmology/optometry visit for glaucoma screening and checkup Recommended yearly dental visit for hygiene and checkup  Vaccinations: Influenza vaccine: up to date Pneumococcal vaccine: up to date Tdap vaccine: up to date Shingles vaccine: Education provided      Preventive Care 65 Years and Older, Female Preventive care refers to lifestyle choices and visits with your health care provider that can promote health and wellness. What does preventive care include? A yearly physical exam. This is also called an annual well check. Dental exams once or twice a year. Routine eye exams. Ask your health care provider how often you should have your eyes checked. Personal lifestyle choices, including: Daily care of your teeth and gums. Regular physical activity. Eating a healthy diet. Avoiding tobacco and drug use. Limiting alcohol use. Practicing safe sex. Taking low-dose aspirin every day. Taking vitamin and mineral supplements as recommended by your health care provider. What happens during an annual well check? The services and screenings done by your health care provider during your annual well check will depend on your age, overall health, lifestyle risk factors, and family history of disease. Counseling  Your health care provider may ask you questions about your: Alcohol use. Tobacco use. Drug use. Emotional well-being. Home and relationship well-being. Sexual activity. Eating habits. History of falls. Memory and ability to understand (cognition). Work and work Astronomer. Reproductive  health. Screening  You may have the following tests or measurements: Height, weight, and BMI. Blood pressure. Lipid and cholesterol levels. These may be checked every 5 years, or more frequently if you are over 45 years old. Skin check. Lung cancer screening. You may have this screening every year starting at age 30 if you have a 30-pack-year history of smoking and currently smoke or have quit within the past 15 years. Fecal occult blood test (FOBT) of the stool. You may have this test every year starting at age 110. Flexible sigmoidoscopy or colonoscopy. You may have a sigmoidoscopy every 5 years or a colonoscopy every 10 years starting at age 66. Hepatitis C blood test. Hepatitis B blood test. Sexually transmitted disease (STD) testing. Diabetes screening. This is done by checking your blood sugar (glucose) after you have not eaten for a while (fasting). You may have this done every 1-3 years. Bone density scan. This is done to screen for osteoporosis. You may have this done starting at age 51. Mammogram. This may be done every 1-2 years. Talk to your health care provider about how often you should have regular mammograms. Talk with your health care provider about your test results, treatment options, and if necessary, the need for more tests. Vaccines  Your health care provider may recommend certain vaccines, such as: Influenza vaccine. This is recommended every year. Tetanus, diphtheria, and acellular pertussis (Tdap, Td) vaccine. You may need a Td booster every 10 years. Zoster vaccine. You may need this after age 50. Pneumococcal 13-valent conjugate (PCV13) vaccine. One dose is recommended after age 9. Pneumococcal polysaccharide (PPSV23) vaccine. One dose is recommended after age 44. Talk to your health care provider about which screenings and vaccines you need and how often you need them. This information is not  intended to replace advice given to you by your health care provider.  Make sure you discuss any questions you have with your health care provider. Document Released: 09/14/2015 Document Revised: 05/07/2016 Document Reviewed: 06/19/2015 Elsevier Interactive Patient Education  2017 [employer].  Fall Prevention in the Home Falls can cause injuries. They can happen to people of all ages. There are many things you can do to make your home safe and to help prevent falls. What can I do on the outside of my home? Regularly fix the edges of walkways and driveways and fix any cracks. Remove anything that might make you trip as you walk through a door, such as a raised step or threshold. Trim any bushes or trees on the path to your home. Use bright outdoor lighting. Clear any walking paths of anything that might make someone trip, such as rocks or tools. Regularly check to see if handrails are loose or broken. Make sure that both sides of any steps have handrails. Any raised decks and porches should have guardrails on the edges. Have any leaves, snow, or ice cleared regularly. Use sand or salt on walking paths during winter. Clean up any spills in your garage right away. This includes oil or grease spills. What can I do in the bathroom? Use night lights. Install grab bars by the toilet and in the tub and shower. Do not use towel bars as grab bars. Use non-skid mats or decals in the tub or shower. If you need to sit down in the shower, use a plastic, non-slip stool. Keep the floor dry. Clean up any water that spills on the floor as soon as it happens. Remove soap buildup in the tub or shower regularly. Attach bath mats securely with double-sided non-slip rug tape. Do not have throw rugs and other things on the floor that can make you trip. What can I do in the bedroom? Use night lights. Make sure that you have a light by your bed that is easy to reach. Do not use any sheets or blankets that are too big for your bed. They should not hang down onto the floor. Have a  firm chair that has side arms. You can use this for support while you get dressed. Do not have throw rugs and other things on the floor that can make you trip. What can I do in the kitchen? Clean up any spills right away. Avoid walking on wet floors. Keep items that you use a lot in easy-to-reach places. If you need to reach something above you, use a strong step stool that has a grab bar. Keep electrical cords out of the way. Do not use floor polish or wax that makes floors slippery. If you must use wax, use non-skid floor wax. Do not have throw rugs and other things on the floor that can make you trip. What can I do with my stairs? Do not leave any items on the stairs. Make sure that there are handrails on both sides of the stairs and use them. Fix handrails that are broken or loose. Make sure that handrails are as long as the stairways. Check any carpeting to make sure that it is firmly attached to the stairs. Fix any carpet that is loose or worn. Avoid having throw rugs at the top or bottom of the stairs. If you do have throw rugs, attach them to the floor with carpet tape. Make sure that you have a light switch at the top of the stairs  and the bottom of the stairs. If you do not have them, ask someone to add them for you. What else can I do to help prevent falls? Wear shoes that: Do not have high heels. Have rubber bottoms. Are comfortable and fit you well. Are closed at the toe. Do not wear sandals. If you use a stepladder: Make sure that it is fully opened. Do not climb a closed stepladder. Make sure that both sides of the stepladder are locked into place. Ask someone to hold it for you, if possible. Clearly mark and make sure that you can see: Any grab bars or handrails. First and last steps. Where the edge of each step is. Use tools that help you move around (mobility aids) if they are needed. These include: Canes. Walkers. Scooters. Crutches. Turn on the lights when you  go into a dark area. Replace any light bulbs as soon as they burn out. Set up your furniture so you have a clear path. Avoid moving your furniture around. If any of your floors are uneven, fix them. If there are any pets around you, be aware of where they are. Review your medicines with your doctor. Some medicines can make you feel dizzy. This can increase your chance of falling. Ask your doctor what other things that you can do to help prevent falls. This information is not intended to replace advice given to you by your health care provider. Make sure you discuss any questions you have with your health care provider. Document Released: 06/14/2009 Document Revised: 01/24/2016 Document Reviewed: 09/22/2014 Elsevier Interactive Patient Education  2017 [employer].

## 2023-06-25 NOTE — Progress Notes (Signed)
Subjective:   [name] is a 89 y.o. female who presents for Medicare Annual (Subsequent) preventive examination.   Patient Location: Home  Provider Location: Home Office  I discussed the limitations of evaluation and management by telemedicine. The patient expressed understanding and agreed to proceed.  Vital Signs: Because this visit was a virtual/telehealth visit, some criteria may be missing or patient reported. Any vitals not documented were not able to be obtained and vitals that have been documented are patient reported.  Patient Medicare AWV questionnaire was completed by the patient on ;10-23 -2024  Patient lives in a facility her daughter [name] answered questions for patient. I have confirmed that all information answered by patient is correct and no changes since this date.  Cardiac Risk Factors include: advanced age (>110men, >30 women);family history of premature cardiovascular disease;hypertension     Objective:    There were no vitals filed for this visit. There is no height or weight on file to calculate BMI.     06/25/2023    9:38 AM 02/10/2023    2:04 PM 07/17/2022   11:27 AM 01/13/2022   11:36 AM 07/18/2021   11:09 AM 01/10/2019    6:27 PM 11/09/2018   10:00 AM  Advanced Directives  Does Patient Have a Medical Advance Directive? Yes Yes Yes Yes Yes Yes Yes  Type of Estate agent of [employer] Power of [place];Out of facility DNR (pink MOST or yellow form);Living will Healthcare Power of [place];Living will;Out of facility DNR (pink MOST or yellow form) Healthcare Power of [place];Living will;Out of facility DNR (pink MOST or yellow form)  Healthcare Power of [place];Living will Healthcare Power of [place];Living will  Does patient want to make changes to medical advance directive?      No - Patient declined No - Patient declined  Copy of Healthcare Power of Attorney in Chart? Yes - validated most recent copy scanned in chart  (See row information)     No - copy requested No - copy requested    Current Medications (verified) Outpatient Encounter Medications as of 06/25/2023  Medication Sig   acebutolol (SECTRAL) 200 MG capsule TAKE 1 CAPSULE BY MOUTH EVERY OTHER DAY   acetaminophen (TYLENOL) 325 MG tablet Take 325 mg by mouth as needed.   divalproex (DEPAKOTE SPRINKLE) 125 MG capsule Take 1 capsule in AM, 4 capsules in PM   ELIQUIS 2.5 MG TABS tablet TAKE 1 TABLET(2.5 MG) BY MOUTH TWICE DAILY   midodrine (PROAMATINE) 5 MG tablet TAKE 1 TABLET (5 MG TOTAL) BY MOUTH 3 (THREE) TIMES DAILY WITH MEALS.   mirtazapine (REMERON) 45 MG tablet Take 1 tablet every night   Multiple Vitamins-Minerals (ALIVE WOMENS 50+ GUMMY) CHEW Chew by mouth.   pantoprazole (PROTONIX) 40 MG tablet Take 40 mg by mouth daily.   polyethylene glycol (MIRALAX / GLYCOLAX) 17 g packet Take 8.5 g by mouth daily.   No facility-administered encounter medications on file as of 06/25/2023.    Allergies (verified) Codeine, Penicillins, and Sulfa antibiotics   History: Past Medical History:  Diagnosis Date   Acute ischemic stroke 11/08/2018   left postcentral gyri; left frontal   Adjustment disorder with depressed mood 10/16/2019   Alteration consciousness 03/08/2020   Apraxia following cerebrovascular accident 10/16/2019   B12 deficiency 10/16/2019   Bilateral impacted cerumen 04/02/2021   Body mass index (BMI) less than 19 02/16/2020   Chronic kidney disease, stage II (mild) 12/06/2019   Closed fracture of one rib of right  side with routine healing 06/30/2017   Confusion    COPD (chronic obstructive pulmonary disease) 11/16/2017   Current use of long term anticoagulation 07/21/2017   Essential hypertension, benign 10/16/2019   First degree atrioventricular block 06/26/2017   Gait abnormality 09/24/2020   Hammer toe 06/26/2017   Hearing loss    Hyperlipidemia 06/26/2017   Laceration of right lower leg 06/18/2020   Major neurocognitive  disorder due to Alzheimer's disease 08/16/2021   Malnutrition of moderate degree 12/06/2019   Memory loss    Other specified cardiac arrhythmias 06/26/2017   Pain in limb 06/26/2017   Palpitations 06/26/2017   Paroxysmal atrial fibrillation 06/26/2017   remote history post operatively, CHADS 2 score=2   Premature atrial complex 06/26/2017   Premature ventricular contractions 06/26/2017   PSVT (paroxysmal supraventricular tachycardia) 11/10/2017   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 10/16/2019   Thoracic aorta atherosclerosis 05/22/2020   Traumatic pneumothorax 06/30/2017   Past Surgical History:  Procedure Laterality Date   ABDOMINAL HYSTERECTOMY     HIP SURGERY  04/2022   SHOULDER SURGERY     TONSILLECTOMY     Family History  Problem Relation Age of Onset   Bone cancer Mother    Breast cancer Father    Diabetes Father    Heart disease Father    Dementia Sister    Breast cancer Sister    Alzheimer's disease Sister    Dementia Brother    Social History   Socioeconomic History   Marital status: Widowed    Spouse name: Not on file   Number of children: 2   Years of education: 36   Highest education level: 12th grade  Occupational History   Occupation: Retired  Tobacco Use   Smoking status: Never    Passive exposure: Past   Smokeless tobacco: Never  Vaping Use   Vaping status: Never Used  Substance and Sexual Activity   Alcohol use: No   Drug use: No   Sexual activity: Not Currently  Other Topics Concern   Not on file  Social History Narrative   [name] is widowed. Her daughter, [name], is her POA and lives 12 miles away. She resides in her own home, alone for last 7 years. Since her CVA she has had someone with her most of the time. She has a hired caregiver or a family member stays most of the day. She feel safe in her home and in her neighborhood.   Right-handed.   0.5 cups caffeine per day.   One story home      Lives at [facility] [phone]   Social Determinants of Health   Financial Resource Strain: Low Risk  (06/25/2023)   Overall Financial Resource Strain (CARDIA)    Difficulty of Paying Living Expenses: Not hard at all  Food Insecurity: No Food Insecurity (06/25/2023)   Hunger Vital Sign    Worried About Running Out of Food in the Last Year: Never true    Ran Out of Food in the Last Year: Never true  Transportation Needs: No Transportation Needs (06/25/2023)   PRAPARE - Administrator, Civil Service (Medical): No    Lack of Transportation (Non-Medical): No  Physical Activity: Inactive (06/25/2023)   Exercise Vital Sign    Days of Exercise per Week: 0 days    Minutes of Exercise per Session: 0 min  Stress: Patient Declined (06/25/2023)   [employer] of Occupational Health - Occupational Stress Questionnaire    Feeling of  Stress : Patient declined  Social Connections: Moderately Isolated (06/25/2023)   Social Connection and Isolation Panel [NHANES]    Frequency of Communication with Friends and Family: More than three times a week    Frequency of Social Gatherings with Friends and Family: Twice a week    Attends Religious Services: Never    Database administrator or Organizations: Yes    Attends Banker Meetings: 1 to 4 times per year    Marital Status: Widowed    Tobacco Counseling Counseling given: Not Answered   Clinical Intake:  Pre-visit preparation completed: Yes  Pain : No/denies pain     Diabetes: No  How often do you need to have someone help you when you read instructions, pamphlets, or other written materials from your doctor or pharmacy?: 5 - Always  Interpreter Needed?: No  Information entered by :: Remi Haggard LPN   Activities of Daily Living    06/25/2023    9:40 AM 06/24/2023    3:51 PM  In your present state of health, do you have any difficulty performing the following activities:  Hearing? 1 1  Vision? 1 1  Difficulty  concentrating or making decisions? 1 1  Walking or climbing stairs? 1 1  Dressing or bathing? 1 1  Doing errands, shopping? 1 1  Preparing Food and eating ? Y Y  Using the Toilet? N N  In the past six months, have you accidently leaked urine? Y Y  Do you have problems with loss of bowel control? Y Y  Managing your Medications? Y Y  Managing your Finances? Malvin Johns  Housekeeping or managing your Housekeeping? Malvin Johns    Patient Care Team: Windell Moment, MD as PCP - General (Family Medicine) Zigmund Daniel., MD as Consulting Physician (Family Medicine) Baldo Daub, MD as Consulting Physician (Cardiology) Van Clines, MD as Consulting Physician (Neurology)  Indicate any recent Medical Services you may have received from other than [facility] providers in the past year (date may be approximate).     Assessment:   This is a routine wellness examination for [name].  Hearing/Vision screen Hearing Screening - Comments:: Has hearing aids bilateral  Vision Screening - Comments:: Not up to date   Goals Addressed             This Visit's Progress    Patient Stated       No goals        Depression Screen    06/25/2023    9:41 AM 05/22/2023   11:12 AM 01/27/2023   11:07 AM 10/29/2021    1:26 PM 03/28/2021    9:44 AM 09/25/2020   10:25 AM 05/22/2020   10:05 AM  PHQ 2/9 Scores  PHQ - 2 Score 4 0 6 2 0 2 6  PHQ- 9 Score 11 3 21 7  6 25     Fall Risk    06/25/2023    9:40 AM 06/24/2023    3:51 PM 05/22/2023   11:12 AM 02/10/2023    2:03 PM 02/01/2023    8:33 PM  Fall Risk   Falls in the past year? 1 1 0 1 1  Number falls in past yr: 1 1 0 0 1  Injury with Fall? 0 1 0 0 1  Risk for fall due to : Impaired balance/gait;Impaired mobility  No Fall Risks  History of fall(s);Impaired balance/gait  Follow up Falls evaluation completed;Education provided;Falls prevention discussed  Falls evaluation completed Falls evaluation  completed Falls evaluation completed    MEDICARE RISK AT  HOME: Medicare Risk at Home Any stairs in or around the home?: No If so, are there any without handrails?: No Home free of loose throw rugs in walkways, pet beds, electrical cords, etc?: Yes Adequate lighting in your home to reduce risk of falls?: Yes Life alert?: No Use of a cane, walker or w/c?: Yes Grab bars in the bathroom?: Yes Shower chair or bench in shower?: Yes Elevated toilet seat or a handicapped toilet?: Yes  TIMED UP AND GO:  Was the test performed?  No    Cognitive Function:    Patient unable to respond to Cognitive Test.          07/16/2020   12:00 PM 03/08/2020    8:03 AM  MMSE - Mini Mental State Exam  Orientation to time 4 5  Orientation to Place 5 5  Registration 3 3  Attention/ Calculation 5 5  Recall 3 3  Language- name 2 objects 2 2  Language- repeat 1 1  Language- follow 3 step command 3 3  Language- read & follow direction 1 1  Write a sentence 1 1  Copy design 1 1  Total score 29 30      04/10/2021   12:00 PM  Montreal Cognitive Assessment   Visuospatial/ Executive (0/5) 1  Naming (0/3) 3  Attention: Read list of digits (0/2) 2  Attention: Read list of letters (0/1) 1  Attention: Serial 7 subtraction starting at 100 (0/3) 2  Language: Repeat phrase (0/2) 2  Language : Fluency (0/1) 0  Abstraction (0/2) 1  Delayed Recall (0/5) 3  Orientation (0/6) 4  Total 19  Adjusted Score (based on education) 19      Immunizations Immunization History  Administered Date(s) Administered   Fluad Quad(high Dose 65+) 05/22/2020, 07/02/2022   Fluad Trivalent(High Dose 65+) 05/22/2023   Influenza-Unspecified 06/04/2021   PFIZER(Purple Top)SARS-COV-2 Vaccination 09/13/2019, 10/03/2019, 06/04/2023   PNEUMOCOCCAL CONJUGATE-20 06/16/2023   Pneumococcal Conjugate-13 08/14/2014   Pneumococcal Polysaccharide-23 08/30/2018   Respiratory Syncytial Virus Vaccine,Recomb Aduvanted(Arexvy) 06/16/2023   Tdap 12/20/2018   Zoster Recombinant(Shingrix) 06/16/2023     TDAP status: Up to date  Flu Vaccine status: Up to date  Pneumococcal vaccine status: Up to date  Covid-19 vaccine status: Completed vaccines  Qualifies for Shingles Vaccine? Yes   Zostavax completed No   Shingrix Completed?: No.    Education has been provided regarding the importance of this vaccine. Patient has been advised to call insurance company to determine out of pocket expense if they have not yet received this vaccine. Advised may also receive vaccine at local pharmacy or Health Dept. Verbalized acceptance and understanding.  Screening Tests Health Maintenance  Topic Date Due   DEXA SCAN  06/24/2024 (Originally 06/05/1993)   COVID-19 Vaccine (4 - 2023-24 season) 07/30/2023   Zoster Vaccines- Shingrix (2 of 2) 08/11/2023   Medicare Annual Wellness (AWV)  06/24/2024   DTaP/Tdap/Td (2 - Td or Tdap) 12/19/2028   Pneumonia Vaccine 62+ Years old  Completed   INFLUENZA VACCINE  Completed   HPV VACCINES  Aged Out    Health Maintenance  There are no preventive care reminders to display for this patient.   Colorectal cancer screening: No longer required.   Mammogram  no longer required  Bone Density no longer required  Lung Cancer Screening: (Low Dose CT Chest recommended if Age 75-80 years, 20 pack-year currently smoking OR have quit w/in 15years.) does not qualify.  Lung Cancer Screening Referral:   Additional Screening:  Hepatitis C Screening: does not qualify;   Vision Screening: Recommended annual ophthalmology exams for early detection of glaucoma and other disorders of the eye. Is the patient up to date with their annual eye exam?  No  Who is the provider or what is the name of the office in which the patient attends annual eye exams? = If pt is not established with a provider, would they like to be referred to a provider to establish care? No .   Dental Screening: Recommended annual dental exams for proper oral hygiene    Community Resource Referral  / Chronic Care Management: CRR required this visit?  No   CCM required this visit?  No     Plan:     I have personally reviewed and noted the following in the patient's chart:   Medical and social history Use of alcohol, tobacco or illicit drugs  Current medications and supplements including opioid prescriptions. Patient is not currently taking opioid prescriptions. Functional ability and status Nutritional status Physical activity Advanced directives List of other physicians Hospitalizations, surgeries, and ER visits in previous 12 months Vitals Screenings to include cognitive, depression, and falls Referrals and appointments  In addition, I have reviewed and discussed with patient certain preventive protocols, quality metrics, and best practice recommendations. A written personalized care plan for preventive services as well as general preventive health recommendations were provided to patient.     Remi Haggard, LPN   [id number]   After Visit Summary: (MyChart) Due to this being a telephonic visit, the after visit summary with patients personalized plan was offered to patient via MyChart   Nurse Notes:

## 2023-07-02 ENCOUNTER — Telehealth: Payer: Self-pay

## 2023-07-02 NOTE — Telephone Encounter (Signed)
[name] called requesting an order to obtain a urine.  Dr. Faylene Kurtz approved urine.

## 2023-07-05 ENCOUNTER — Other Ambulatory Visit: Payer: Self-pay | Admitting: Physician Assistant

## 2023-07-05 DIAGNOSIS — N3001 Acute cystitis with hematuria: Secondary | ICD-10-CM

## 2023-07-05 MED ORDER — NITROFURANTOIN MONOHYD MACRO 100 MG PO CAPS: 100.0000 mg | ORAL_CAPSULE | Freq: Two times a day (BID) | ORAL | 0 refills | Status: DC

## 2023-07-06 ENCOUNTER — Telehealth: Payer: Self-pay

## 2023-07-06 NOTE — Telephone Encounter (Signed)
[facility] LAB RESULTS FORMS

## 2023-07-07 ENCOUNTER — Telehealth: Payer: Self-pay

## 2023-07-07 NOTE — Telephone Encounter (Signed)
Mica, from a facility ( I could not catch what facility from the voicemail) called about this patient, She said there was an order sent for you to sign for a UA and C&S. She needs this signed and faxed to [phone].

## 2023-07-17 ENCOUNTER — Ambulatory Visit (INDEPENDENT_AMBULATORY_CARE_PROVIDER_SITE_OTHER): Payer: PPO

## 2023-07-17 VITALS — BP 98/60 | HR 100 | Temp 97.6°F | Resp 16 | Ht 62.0 in | Wt 92.4 lb

## 2023-07-17 DIAGNOSIS — H04123 Dry eye syndrome of bilateral lacrimal glands: Secondary | ICD-10-CM

## 2023-07-17 DIAGNOSIS — H6122 Impacted cerumen, left ear: Secondary | ICD-10-CM

## 2023-07-17 DIAGNOSIS — H6123 Impacted cerumen, bilateral: Secondary | ICD-10-CM | POA: Diagnosis not present

## 2023-07-17 DIAGNOSIS — I10 Essential (primary) hypertension: Secondary | ICD-10-CM | POA: Diagnosis not present

## 2023-07-17 DIAGNOSIS — Z0000 Encounter for general adult medical examination without abnormal findings: Secondary | ICD-10-CM

## 2023-07-17 MED ORDER — DEBROX 6.5 % OT SOLN: 5.0000 [drp] | Freq: Two times a day (BID) | OTIC | 0 refills | Status: AC

## 2023-07-17 NOTE — Progress Notes (Unsigned)
Subjective:  Patient ID: [name], female    DOB: September 30, 1925  Age: 89 y.o. MRN: [id number]  Chief Complaint  Patient presents with  . Follow-up    HPI    Patient would like her ears checked ans her right eye checked.      06/25/2023    9:41 AM 05/22/2023   11:12 AM 01/27/2023   11:07 AM 10/29/2021    1:26 PM 03/28/2021    9:44 AM  Depression screen PHQ 2/9  Decreased Interest 2 0 3 1 0  Down, Depressed, Hopeless 2 0 3 1 0  PHQ - 2 Score 4 0 6 2 0  Altered sleeping 0 0 3 0   Tired, decreased energy 0 2 3 1    Change in appetite 3 1 3 3    Feeling bad or failure about yourself  0 0 0 1   Trouble concentrating 3 0 3 0   Moving slowly or fidgety/restless 1  3 0   Suicidal thoughts 0 0 -- 0   PHQ-9 Score 11 3 21 7    Difficult doing work/chores Somewhat difficult Not difficult at all Extremely dIfficult          06/25/2023    9:40 AM  Fall Risk   Falls in the past year? 1  Number falls in past yr: 1  Injury with Fall? 0  Risk for fall due to : Impaired balance/gait;Impaired mobility  Follow up Falls evaluation completed;Education provided;Falls prevention discussed    Patient Care Team: Windell Moment, MD as PCP - General (Family Medicine) Zigmund Daniel., MD as Consulting Physician (Family Medicine) Baldo Daub, MD as Consulting Physician (Cardiology) Van Clines, MD as Consulting Physician (Neurology)   Review of Systems  Current Outpatient Medications on File Prior to Visit  Medication Sig Dispense Refill  . acebutolol (SECTRAL) 200 MG capsule TAKE 1 CAPSULE BY MOUTH EVERY OTHER DAY 45 capsule 2  . acetaminophen (TYLENOL) 325 MG tablet Take 325 mg by mouth as needed.    . divalproex (DEPAKOTE SPRINKLE) 125 MG capsule Take 1 capsule in AM, 4 capsules in PM 450 capsule 3  . ELIQUIS 2.5 MG TABS tablet TAKE 1 TABLET(2.5 MG) BY MOUTH TWICE DAILY 180 tablet 1  . midodrine (PROAMATINE) 5 MG tablet TAKE 1 TABLET (5 MG TOTAL) BY MOUTH 3 (THREE) TIMES  DAILY WITH MEALS. 270 tablet 1  . mirtazapine (REMERON) 45 MG tablet Take 1 tablet every night 90 tablet 3  . Multiple Vitamins-Minerals (ALIVE WOMENS 50+ GUMMY) CHEW Chew by mouth.    . pantoprazole (PROTONIX) 40 MG tablet Take 40 mg by mouth daily.    . polyethylene glycol (MIRALAX / GLYCOLAX) 17 g packet Take 8.5 g by mouth daily. 1 each 0   No current facility-administered medications on file prior to visit.   Past Medical History:  Diagnosis Date  . Acute ischemic stroke 11/08/2018   left postcentral gyri; left frontal  . Adjustment disorder with depressed mood 10/16/2019  . Alteration consciousness 03/08/2020  . Apraxia following cerebrovascular accident 10/16/2019  . B12 deficiency 10/16/2019  . Bilateral impacted cerumen 04/02/2021  . Body mass index (BMI) less than 19 02/16/2020  . Chronic kidney disease, stage II (mild) 12/06/2019  . Closed fracture of one rib of right side with routine healing 06/30/2017  . Confusion   . COPD (chronic obstructive pulmonary disease) 11/16/2017  . Current use of long term anticoagulation 07/21/2017  . Essential hypertension, benign 10/16/2019  .  First degree atrioventricular block 06/26/2017  . Gait abnormality 09/24/2020  . Hammer toe 06/26/2017  . Hearing loss   . Hyperlipidemia 06/26/2017  . Laceration of right lower leg 06/18/2020  . Major neurocognitive disorder due to Alzheimer's disease 08/16/2021  . Malnutrition of moderate degree 12/06/2019  . Memory loss   . Other specified cardiac arrhythmias 06/26/2017  . Pain in limb 06/26/2017  . Palpitations 06/26/2017  . Paroxysmal atrial fibrillation 06/26/2017   remote history post operatively, CHADS 2 score=2  . Premature atrial complex 06/26/2017  . Premature ventricular contractions 06/26/2017  . PSVT (paroxysmal supraventricular tachycardia) 11/10/2017  . 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 Thoracic aorta atherosclerosis 05/22/2020  . Traumatic  pneumothorax 06/30/2017   Past Surgical History:  Procedure Laterality Date  . ABDOMINAL HYSTERECTOMY    . HIP SURGERY  04/2022  . SHOULDER SURGERY    . TONSILLECTOMY      Family History  Problem Relation Age of Onset  . Bone cancer Mother   . Breast cancer Father   . Diabetes Father   . Heart disease Father   . Dementia Sister   . Breast cancer Sister   . Alzheimer's disease Sister   . Dementia Brother    Social History   Socioeconomic History  . Marital status: Widowed    Spouse name: Not on file  . Number of children: 2  . Years of education: 93  . Highest education level: 12th grade  Occupational History  . Occupation: Retired  Tobacco Use  . Smoking status: Never    Passive exposure: Past  . Smokeless tobacco: Never  Vaping Use  . Vaping status: Never Used  Substance and Sexual Activity  . Alcohol use: No  . Drug use: No  . Sexual activity: Not Currently  Other Topics Concern  . Not on file  Social History Narrative   [name] is widowed. Her daughter, [name], is her POA and lives 12 miles away. She resides in her own home, alone for last 7 years. Since her CVA she has had someone with her most of the time. She has a hired caregiver or a family member stays most of the day. She feel safe in her home and in her neighborhood.   Right-handed.   0.5 cups caffeine per day.   One story home      L[facility] at [facility] [phone]   Social Determinants of Health   Financial Resource Strain: Low Risk  (06/25/2023)   Overall Financial Resource Strain (CARDIA)   . Difficulty of Paying Living Expenses: Not hard at all  Food Insecurity: No Food Insecurity (06/25/2023)   Hunger Vital Sign   . Worried About Programme researcher, broadcasting/film/video in the Last Year: Never true   . Ran Out of Food in the Last Year: Never true  Transportation Needs: No Transportation Needs (06/25/2023)   PRAPARE - Transportation   . Lack of Transportation (Medical): No   . Lack of  Transportation (Non-Medical): No  Physical Activity: Inactive (06/25/2023)   Exercise Vital Sign   . Days of Exercise per Week: 0 days   . Minutes of Exercise per Session: 0 min  Stress: Patient Declined (06/25/2023)   [employer] of Occupational Health - Occupational Stress Questionnaire   . Feeling of Stress : Patient declined  Social Connections: Moderately Isolated (06/25/2023)   Social Connection and Isolation Panel [NHANES]   . Frequency of Communication with Friends and Family: More than three times a  week   . Frequency of Social Gatherings with Friends and Family: Twice a week   . Attends Religious Services: Never   . Active Member of Clubs or Organizations: Yes   . Attends Banker Meetings: 1 to 4 times per year   . Marital Status: Widowed    Objective:  LMP  (LMP Unknown)      05/22/2023   11:09 AM 02/20/2023   11:02 AM 02/10/2023    2:02 PM  BP/Weight  Systolic BP 90 100 100  Diastolic BP 62 70 62  Wt. (Lbs) 92 90 89.6  BMI 16.83 kg/m2 16.46 kg/m2 16.39 kg/m2    Physical Exam HENT:     Right Ear: There is impacted cerumen.     Left Ear: There is impacted cerumen.    Diabetic Foot Exam - Simple   No data filed      Lab Results  Component Value Date   WBC 6.6 01/27/2023   HGB 11.8 01/27/2023   HCT 35.4 01/27/2023   PLT 260 01/27/2023   GLUCOSE 119 (H) 01/27/2023   CHOL 204 (H) 10/01/2022   TRIG 113 10/01/2022   HDL 46 10/01/2022   LDLCALC 138 (H) 10/01/2022   ALT 5 01/27/2023   AST 14 01/27/2023   NA 140 01/27/2023   K 4.9 01/27/2023   CL 100 01/27/2023   CREATININE 0.71 01/27/2023   BUN 24 01/27/2023   CO2 23 01/27/2023   TSH 2.190 01/27/2023   HGBA1C 5.3 11/09/2018      Assessment & Plan:    There are no diagnoses linked to this encounter.   No orders of the defined types were placed in this encounter.   No orders of the defined types were placed in this encounter.    Follow-up: No follow-ups on  file.   I,Angela Taylor,acting as a Neurosurgeon for [employer], MD.,have documented all relevant documentation on the behalf of Windell Moment, MD,as directed by  Windell Moment, MD while in the presence of Windell Moment, MD.   An After Visit Summary was printed and given to the patient.  Windell Moment, MD [facility] [phone]

## 2023-07-17 NOTE — Patient Instructions (Signed)
Some ear wax removed but sent DEBROX EAR DROPS for better results Defer blood work to next visit Continue systane eye drops and let me know if the right eye gets crusty or reddish No medication changes Return in 6 months

## 2023-07-20 DIAGNOSIS — Z0000 Encounter for general adult medical examination without abnormal findings: Secondary | ICD-10-CM | POA: Insufficient documentation

## 2023-07-20 DIAGNOSIS — H04123 Dry eye syndrome of bilateral lacrimal glands: Secondary | ICD-10-CM | POA: Insufficient documentation

## 2023-07-20 NOTE — Assessment & Plan Note (Signed)
Well controlled.  No changes to medicines. Acetabutolol 200 mg every other day. Continue to work on eating a healthy diet and exercise.  Labs drawn today.

## 2023-07-20 NOTE — Assessment & Plan Note (Signed)
Dry Eyes Chronic dry eyes with recent increased redness in the right eye. No signs of conjunctivitis. Currently using Systane eye drops three times a day. Room temperature at 75F may contribute to symptoms. - Continue Systane eye drops three times a day - Use cold wipes if crusting occurs - Contact if symptoms worsen

## 2023-07-20 NOTE — Assessment & Plan Note (Addendum)
Procedure: Cerumen removal BILATERAL Description: Plastic curette used to remove cerumen from the left and right external auditory canals. Significant amount of cerumen removed, but some remained due to discomfort.  Significant cerumen impaction in both ears causing hearing difficulties. Manual removal was painful and incomplete. Debrox ear drops recommended to dissolve earwax. - Prescribe Debrox ear drops, 5 drops in each ear twice daily for a couple of days, repeat every two weeks - Send prescription to [facility] in [place] - Reassess if symptoms persist after using ear drops

## 2023-07-20 NOTE — Assessment & Plan Note (Signed)
Recent UTI treated with antibiotics a week ago. Symptoms included increased crankiness and uncooperativeness. Monitor for recurrence. - Monitor for recurrence of UTI symptoms - Return if symptoms reappear  General Health Maintenance Routine blood work was last done in May and results were well-managed. No current need for medication refills. Blood work to be deferred to next visit. - Defer blood work to next visit in six months - Continue current medications including Eliquis, acebutolol, and Depakote  Follow-up - Schedule follow-up visit in six months - Return earlier if any issues arise, particularly related to UTI or earwax buildup.

## 2023-07-28 ENCOUNTER — Ambulatory Visit (INDEPENDENT_AMBULATORY_CARE_PROVIDER_SITE_OTHER): Payer: PPO

## 2023-07-28 VITALS — BP 104/62 | HR 81 | Temp 98.0°F | Resp 16 | Ht 62.0 in | Wt 92.6 lb

## 2023-07-28 DIAGNOSIS — N390 Urinary tract infection, site not specified: Secondary | ICD-10-CM

## 2023-07-28 DIAGNOSIS — R410 Disorientation, unspecified: Secondary | ICD-10-CM | POA: Insufficient documentation

## 2023-07-28 MED ORDER — CEPHALEXIN 500 MG PO CAPS: 500.0000 mg | ORAL_CAPSULE | Freq: Three times a day (TID) | ORAL | 0 refills | Status: DC

## 2023-07-28 MED ORDER — CEPHALEXIN 500 MG PO CAPS: 500.0000 mg | ORAL_CAPSULE | Freq: Three times a day (TID) | ORAL | 0 refills | Status: AC

## 2023-07-28 MED ORDER — CIPROFLOXACIN HCL 500 MG PO TABS: 500.0000 mg | ORAL_TABLET | Freq: Two times a day (BID) | ORAL | 0 refills | Status: DC

## 2023-07-28 NOTE — Assessment & Plan Note (Signed)
Presents with confusion, likely due to a UTI. Last urine culture on September 20th showed E. coli. Recurrent UTIs.  She is unfortunately, unable to give a urine sample today   Allergic to penicillins and sulfa drugs, likely sensitivities. Difficulty taking large pills, prefers liquids or small pills mixed with food.  Discussed risks of untreated UTI including worsening confusion and potential sepsis. Benefits of treatment include resolution of infection and symptoms. Alternatives include other antibiotics, but sensitivities limit options. Decision to use Keflex based on culture sensitivities and allergy profile. - Sent antibiotic prescription for Keflex 500 mg TID to [facility] - Instruct pharmacy that the group home staff could open capsules and mix with food - Increase water intake - Send home with cups and a hat for future urine collection  Constipation Reports difficulty with bowel movements, uses Miralax and apple juice. Bowel movements result in diarrhea, potentially contributing to recurrent UTIs due to E. coli contamination. Discussed risks of untreated constipation including bowel obstruction and worsening UTI. Benefits of current regimen include regular bowel movements. Alternatives include other laxatives or dietary changes. Decision to continue current regimen based on effectiveness and patient preference. - Continue current regimen of Miralax and apple juice - Monitor bowel movements and adjust treatment as necessary  General Health Maintenance 89 years old, lives in a facility where medications are administered by trained med techs. Needs to increase fluid intake to prevent dehydration and potential complications. Discussed importance of hydration to prevent UTIs and other complications. - Encourage increased water intake - Provide anticipatory guidance on the importance of hydration  Follow-up - Check with the pharmacy in 1-2 hours to ensure the prescription is  ready - Follow up with primary care provider if symptoms do not improve or worsen.

## 2023-07-28 NOTE — Progress Notes (Signed)
Established Patient Office Visit  Subjective   Patient ID: [name], female    DOB: 05-08-1926  Age: 89 y.o. MRN: [id number]  Chief Complaint  Patient presents with   Urinary Tract Infection    Urinary Tract Infection     The patient, an 89 year old with a history of urinary incontinence is brought in by her daughter with increased confusion over the past four days. DAUGHTER IS THE PRIMARY HISTORIAN DUE TO PATIENT'S AGE RELATED COGNITIVE DECLINE.  The confusion is described as being significantly more severe than her baseline cognitive state. This change in mental status is often the first symptom before the patient becomes agitated and irritable. The patient also reports constipation, despite taking Miralax and apple juice. However, when bowel movements do occur, they are described as diarrhea.  The patient has a history of recurrent urinary tract infections, typically caused by E. coli, which are believed to be secondary to fecal contamination from diarrhea. The last urine culture was performed on September 20th, and it also grew E. coli. The patient has known allergies to penicillins and sulfa, but it is unclear whether these reactions were sensitivities or true allergies.  The patient resides in a care facility and has difficulty swallowing large pills, requiring either liquid medications or small pills that can be mixed with applesauce or pudding. The patient's kidney function is reported to be excellent. The patient's fluid intake is reportedly low, and she is encouraged to increase her water consumption. Patient Active Problem List   Diagnosis Date Noted   Confusion and disorientation 07/28/2023   Dry eyes, bilateral 07/20/2023   Healthcare maintenance 07/20/2023   Acute cystitis with hematuria 05/22/2023   Malaise and fatigue 05/22/2023   Hearing loss of left ear due to cerumen impaction 01/29/2023   Chronic idiopathic constipation 01/27/2023   Severe episode of recurrent  major depressive disorder, without psychotic features (HCC) 01/27/2023   Current use of long term anticoagulation 08/18/2022   Laceration of skin of forearm, right, initial encounter 03/09/2022   Fall at home, initial encounter 03/09/2022   Melanotic stools 12/31/2021   Acquired thrombophilia (HCC) 12/31/2021   Cerebral atrophy (HCC) 11/11/2021   Falls frequently 11/11/2021   Back pain 10/29/2021   Major neurocognitive disorder due to Alzheimer's disease 08/16/2021   Gait abnormality 09/24/2020   Thoracic aorta atherosclerosis 05/22/2020   Hearing loss    Confusion    Alteration consciousness 03/08/2020   Body mass index (BMI) less than 19 02/16/2020   Hypotensive episode 12/06/2019   Chronic kidney disease, stage II (mild) 12/06/2019   Malnutrition of moderate degree 12/06/2019   Essential hypertension, benign 10/16/2019   Adjustment disorder with depressed mood 10/16/2019   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 10/16/2019   Apraxia following cerebrovascular accident 10/16/2019   B12 deficiency 10/16/2019   Cellulitis of second toe, left 02/22/2019   Acute ischemic stroke 11/08/2018   COPD (chronic obstructive pulmonary disease) 11/16/2017   PSVT (paroxysmal supraventricular tachycardia) 11/10/2017   Traumatic pneumothorax 06/30/2017   First degree atrioventricular block 06/26/2017   Hammer toe 06/26/2017   Hyperlipidemia 06/26/2017   Paroxysmal atrial fibrillation 06/26/2017   Premature ventricular contractions 06/26/2017   Premature atrial complex 06/26/2017   Palpitations 06/26/2017   Other specified cardiac arrhythmias 06/26/2017   Memory loss 06/26/2017   Past Medical History:  Diagnosis Date   Acute ischemic stroke 11/08/2018   left postcentral gyri; left frontal   Adjustment disorder with depressed mood 10/16/2019   Alteration consciousness 03/08/2020  Apraxia following cerebrovascular accident 10/16/2019   B12 deficiency 10/16/2019   Bilateral impacted  cerumen 04/02/2021   Body mass index (BMI) less than 19 02/16/2020   Chronic kidney disease, stage II (mild) 12/06/2019   Closed fracture of one rib of right side with routine healing 06/30/2017   Confusion    COPD (chronic obstructive pulmonary disease) 11/16/2017   Current use of long term anticoagulation 07/21/2017   Essential hypertension, benign 10/16/2019   First degree atrioventricular block 06/26/2017   Gait abnormality 09/24/2020   Hammer toe 06/26/2017   Hearing loss    Hyperlipidemia 06/26/2017   Laceration of right lower leg 06/18/2020   Major neurocognitive disorder due to Alzheimer's disease 08/16/2021   Malnutrition of moderate degree 12/06/2019   Memory loss    Other specified cardiac arrhythmias 06/26/2017   Pain in limb 06/26/2017   Palpitations 06/26/2017   Paroxysmal atrial fibrillation 06/26/2017   remote history post operatively, CHADS 2 score=2   Premature atrial complex 06/26/2017   Premature ventricular contractions 06/26/2017   PSVT (paroxysmal supraventricular tachycardia) 11/10/2017   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  HENT:     Head: Atraumatic.     Mouth/Throat:     Comments: Dry tongue Cardiovascular:     Rate and Rhythm: Normal rate and regular rhythm.  Pulmonary:     Effort: Pulmonary effort is normal.     Breath sounds:  Normal breath sounds.  Abdominal:     Comments: No abdominal or flank tenderness noted  Neurological:     Mental Status: She is alert.     Comments: Not very oriented to time or date       No results found for any visits on 07/28/23.  Last metabolic panel Lab Results  Component Value Date   GLUCOSE 119 (H) 01/27/2023   NA 140 01/27/2023   K 4.9 01/27/2023   CL 100 01/27/2023   CO2 23 01/27/2023   BUN 24 01/27/2023   CREATININE 0.71 01/27/2023   EGFR 68.9 05/09/2023   CALCIUM 9.2 01/27/2023   PROT 6.8 01/27/2023   ALBUMIN 3.8 01/27/2023   LABGLOB 3.0 01/27/2023   AGRATIO 1.3 01/27/2023   BILITOT 0.2 01/27/2023   ALKPHOS 74 01/27/2023   AST 14 01/27/2023   ALT 5 01/27/2023   ANIONGAP 6 11/09/2018      The ASCVD Risk score (Arnett DK, et al., 2019) failed to calculate for the following reasons:   The 2019 ASCVD risk score is only valid for ages 3 to 63   The patient has a prior MI or stroke diagnosis    Assessment & Plan:   Problem List Items Addressed This Visit     Confusion and disorientation - Primary  Presents with confusion, likely due to a UTI. Last urine culture on September 20th showed E. coli. Recurrent UTIs.  She is unfortunately, unable to give a urine sample today   Allergic to penicillins and sulfa drugs, likely sensitivities. Difficulty taking large pills, prefers liquids or small pills mixed with food.  Discussed risks of untreated UTI including worsening confusion and potential sepsis. Benefits of treatment include resolution of infection and symptoms. Alternatives include other antibiotics, but sensitivities limit options. Decision to use Keflex based on culture sensitivities and allergy profile. - Sent antibiotic prescription for Keflex 500 mg TID to [facility] - Instruct pharmacy that the group home staff could open capsules and mix with food - Increase water intake - Send home with cups and a hat for future urine  collection  Constipation Reports difficulty with bowel movements, uses Miralax and apple juice. Bowel movements result in diarrhea, potentially contributing to recurrent UTIs due to E. coli contamination. Discussed risks of untreated constipation including bowel obstruction and worsening UTI. Benefits of current regimen include regular bowel movements. Alternatives include other laxatives or dietary changes. Decision to continue current regimen based on effectiveness and patient preference. - Continue current regimen of Miralax and apple juice - Monitor bowel movements and adjust treatment as necessary  General Health Maintenance 89 years old, lives in a facility where medications are administered by trained med techs. Needs to increase fluid intake to prevent dehydration and potential complications. Discussed importance of hydration to prevent UTIs and other complications. - Encourage increased water intake - Provide anticipatory guidance on the importance of hydration  Follow-up - Check with the pharmacy in 1-2 hours to ensure the prescription is ready - Follow up with primary care provider if symptoms do not improve or worsen.      Relevant Orders   Urine Culture   Other Visit Diagnoses     Recurrent urinary tract infection       Relevant Medications   cephALEXin (KEFLEX) 500 MG capsule   Other Relevant Orders   Urine Culture       Return if symptoms worsen or fail to improve.    Windell Moment, MD

## 2023-07-28 NOTE — Patient Instructions (Signed)
Sent antibiotic keflex based on the prior sensitivities. Increase water intake Will send home with cups for urine collection Report back if any worsening symptoms or call 911 for any severe symptoms

## 2023-08-04 ENCOUNTER — Ambulatory Visit: Payer: Self-pay

## 2023-08-04 IMAGING — MR MR HEAD W/O CM
10 series · 48 of 48 positions shown · non-contrast
Comparison: MRI head 01/16/2020

CLINICAL DATA: Memory loss

EXAM:
MRI HEAD WITHOUT CONTRAST
TECHNIQUE: Multiplanar, multiecho pulse sequences of the brain and surrounding
structures were obtained without intravenous contrast.

[Series 2: T1 · sagittal · 5.0mm · 0.45mm/px · 2 of 21 slices shown]
[im 1/21]
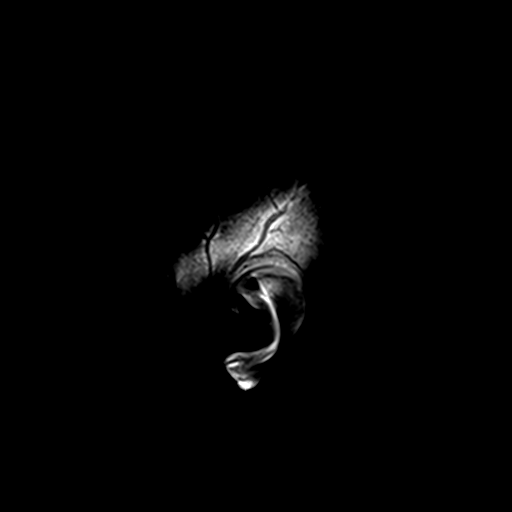
[im 21/21]
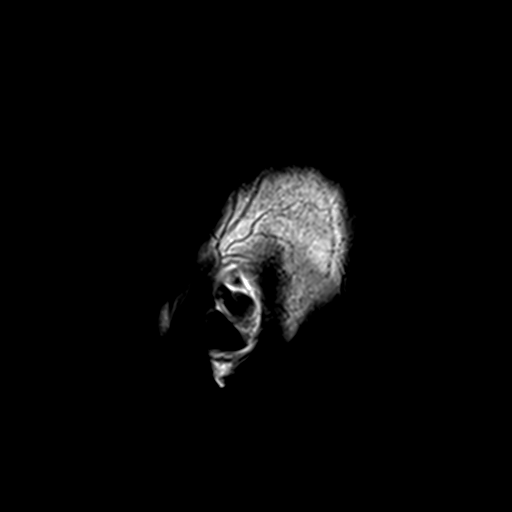

[Series 3: DWI · axial · 3.0mm · 1.80mm/px · z∈[-20,+120]mm · 9 of 99 slices shown (1 of 4)]
[im 1/99]
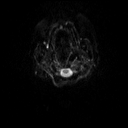
[im 13/99]
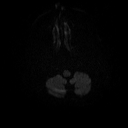
[im 25/99]
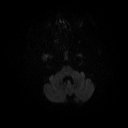
[im 37/99]
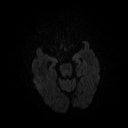
[im 50/99]
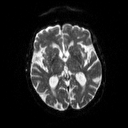
[im 62/99]
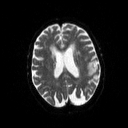
[im 74/99]
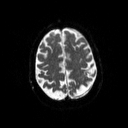
[im 86/99]
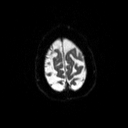
[im 99/99]
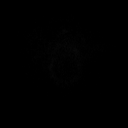

[Series 4: DWI · axial · 3.0mm · 1.80mm/px · z∈[-20,+120]mm · 5 of 50 slices shown (2 of 4)]
[im 1/50]
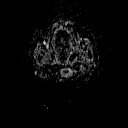
[im 13/50]
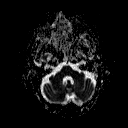
[im 25/50]
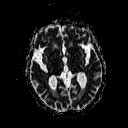
[im 37/50]
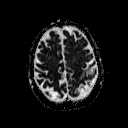
[im 50/50]
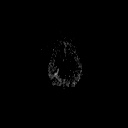

[Series 5: DWI · coronal · 5.0mm · 1.80mm/px · 6 of 68 slices shown (3 of 4)]
[im 1/68]
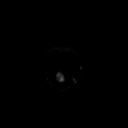
[im 14/68]
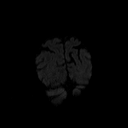
[im 27/68]
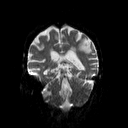
[im 41/68]
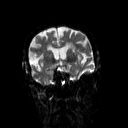
[im 54/68]
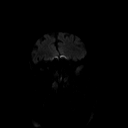
[im 68/68]
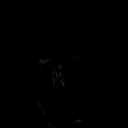

[Series 6: DWI · coronal · 5.0mm · 1.80mm/px · 3 of 34 slices shown (4 of 4)]
[im 1/34]
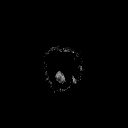
[im 17/34]
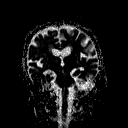
[im 34/34]
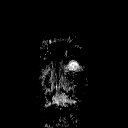

[Series 7: T2 · axial · 5.0mm · 0.51mm/px · z∈[-18,+121]mm · 2 of 22 slices shown (1 of 2)]
[im 1/22]
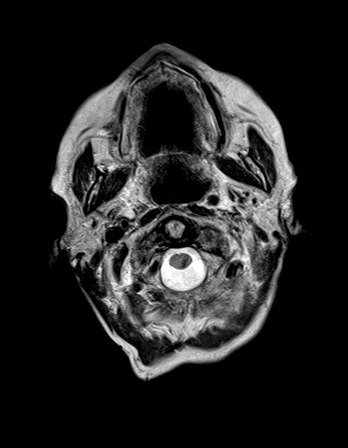
[im 22/22]
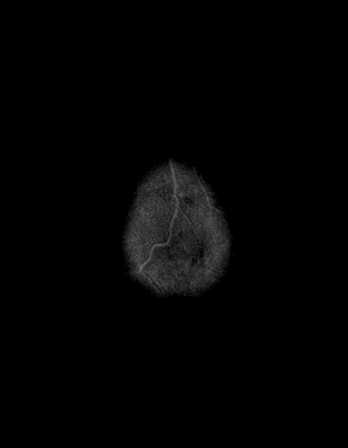

[Series 8: FLAIR · axial · 3.0mm · 0.45mm/px · z∈[-21,+116]mm · 3 of 32 slices shown]
[im 1/32]
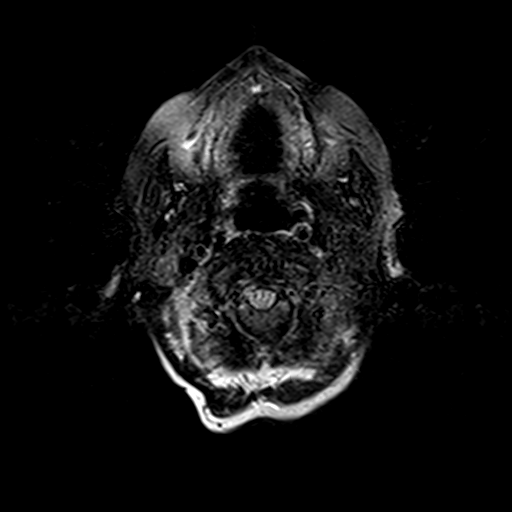
[im 16/32]
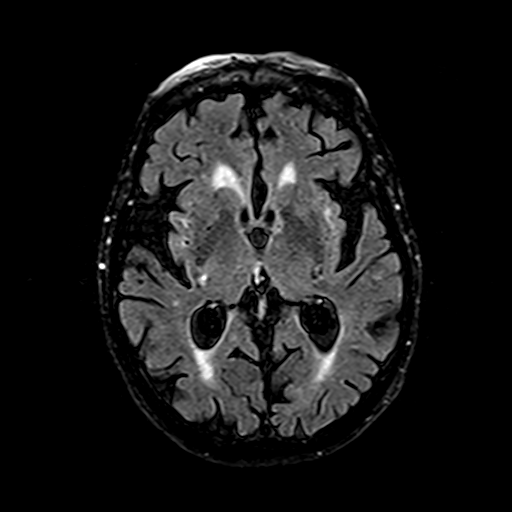
[im 32/32]
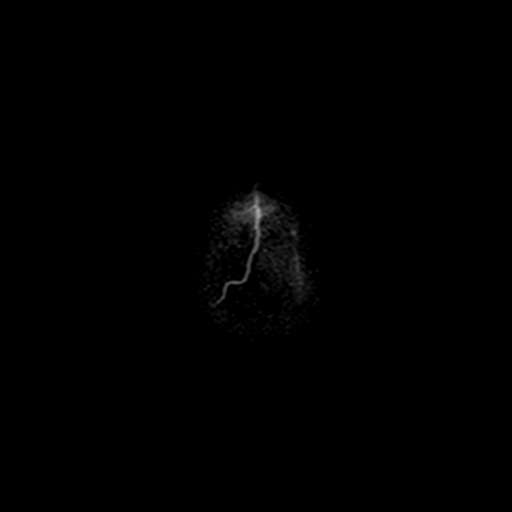

[Series 10: swi_images · axial · 4.0mm · 0.90mm/px · z∈[-19,+114]mm · 3 of 36 slices shown]
[im 1/36]
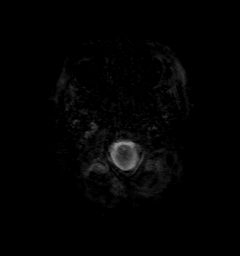
[im 18/36]
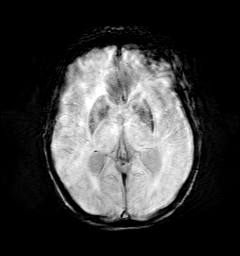
[im 36/36]
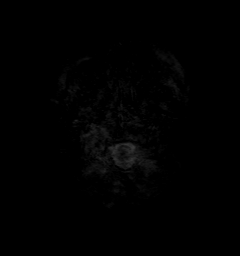

[Series 11: t1_mpr_tra · axial · 1.0mm · 0.71mm/px · z∈[-19,+117]mm · 13 of 144 slices shown]
[im 1/144]
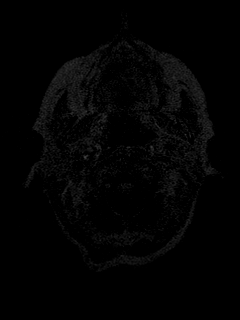
[im 12/144]
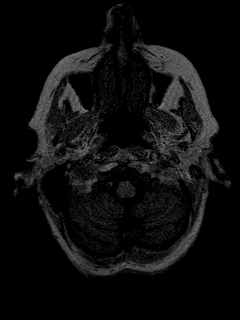
[im 24/144]
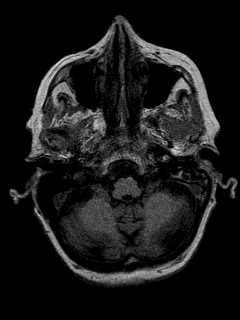
[im 36/144]
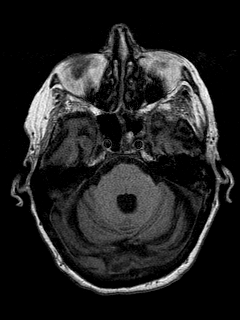
[im 48/144]
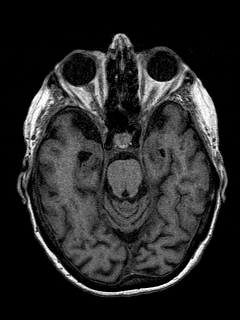
[im 60/144]
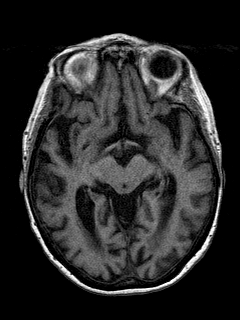
[im 72/144]
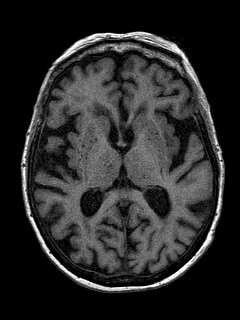
[im 84/144]
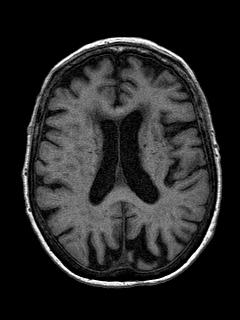
[im 96/144]
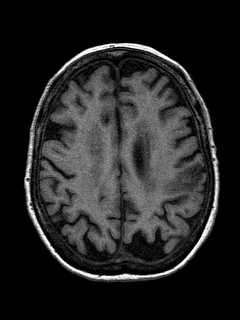
[im 108/144]
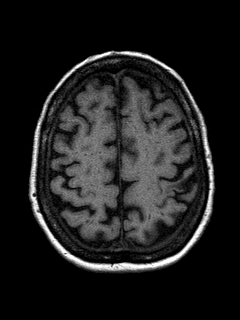
[im 120/144]
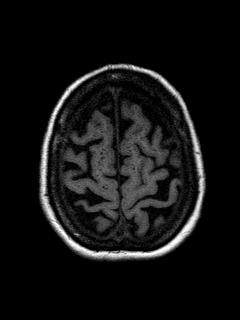
[im 132/144]
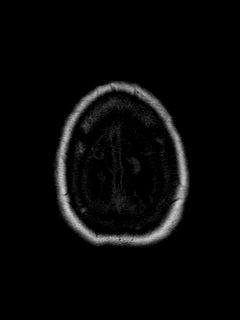
[im 144/144]
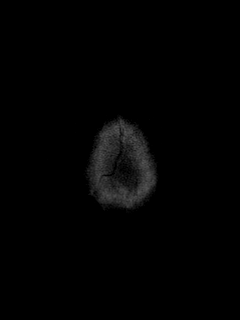

[Series 12: T2 · coronal · 5.0mm · 0.45mm/px · 2 of 25 slices shown (2 of 2)]
[im 1/25]
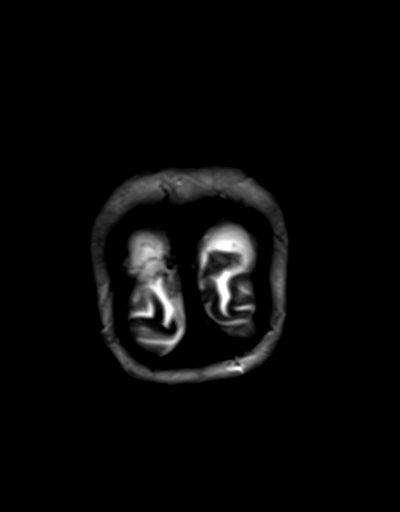
[im 25/25]
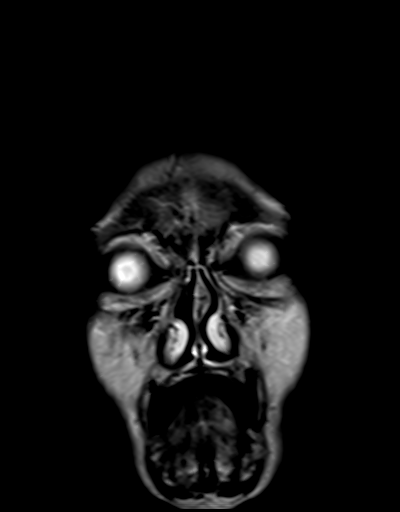

[48 of 48 positions shown; findings below may reference images not displayed]

FINDINGS: Brain: Generalized atrophy has progressed and is now moderate in
degree. No hydrocephalus. Moderate white matter changes with
periventricular deep white matter hyperintensity. Mild progression.
Chronic infarct left parietal lobe unchanged.

Negative for acute infarct, hemorrhage, mass

Vascular: Normal arterial flow voids at the base of brain.

Skull and upper cervical spine: Negative

Sinuses/Orbits: Paranasal sinuses clear. Bilateral cataract
extraction

Other: None
IMPRESSION: No acute abnormality

Progression of atrophy and chronic microvascular ischemia since

## 2023-08-04 NOTE — Telephone Encounter (Signed)
Copied from CRM [id number]. Topic: Clinical - Red Word Triage >> Aug 04, 2023  2:09 PM Tiffany H wrote: Red Word that prompted transfer to Nurse Triage: Patient's daughter called to advise that patient has a temperature of 100. Patient's daughter would like to know if she should be brought in for care.   Chief Complaint: Nasal congestion, fever of 100 F Symptoms: nasal congestion, fever 100 F, hoarseness, phlegmy throat Frequency: Symptoms all started today Pertinent Negatives: Patient denies SOB, cough, chills/shivering to daughter's knowledge, pain, other symptoms at this time Disposition: [] ED /[] Urgent Care (no appt availability in office) / [] Appointment(In office/virtual)/ []  [facility]/ [x] Home Care/ [] Refused Recommended Disposition /[] [facility]/ []  Follow-up with PCP Additional Notes: Pt's daughter reporting that pt's assisted living is "very concerned about her" with pt's new symptoms of her nose being "really congested," temp of 100 F, throat congestion with phlegm not breaking up and coming out, "super hoarse," and "not feeling real good." Daughter confirms that symptoms all started today, confirms no cough, SOB, or other symptoms. Daughter reporting that pt "never" drinks enough water, "2 sips and she's done" for the day usually. Daughter asking if necessary for pt to be seen, since it is not easy process to get pt transported to doc office from assisted living. Advised that if concerned for pt wellbeing then have appt scheduled, otherwise monitor for worsening or emerging symptoms, try tylenol for fever as needed, encourage hydration, ensure still urinating at least every 8 hours and not dark dark urine, monitor for worsening confusion, and call back for appt especially if fever for 3 days or symptoms worsening/not improving. Advised to take pt to ED if signs of dehydration or other complications. Daughter verbalized understanding and will also talk with assisted living  for more details on pt status and what to monitor for.  Reason for Disposition  Common cold with no complications  Answer Assessment - Initial Assessment Questions 1. TEMPERATURE: "What is the most recent temperature?"  "How was it measured?"      100 F, only temp taken recently 2. ONSET: "When did the fever start?"      Today 3. CHILLS: "Do you have chills?" If yes: "How bad are they?"  (e.g., none, mild, moderate, severe)   - NONE: no chills   - MILD: feeling cold   - MODERATE: feeling very cold, some shivering (feels better under a thick blanket)   - SEVERE: feeling extremely cold with shaking chills (general body shaking, rigors; even under a thick blanket)      No mention of chills or shivering, pt's daughter unable to assess at this time 4. OTHER SYMPTOMS: "Do you have any other symptoms besides the fever?"  (e.g., abdomen pain, cough, diarrhea, earache, headache, sore throat, urination pain)     Pt's daughter reporting that pt is "really congested, keeps trying to clear her throat, super hoarse." Daughter denies other symptoms at this time. 5. CAUSE: If there are no symptoms, ask: "What do you think is causing the fever?"      Daughter thinking pt may have a cold. 6. CONTACTS: "Does anyone else in the family have an infection?"     Pt lives in assisted living, likely other exposures. 7. TREATMENT: "What have you done so far to treat this fever?" (e.g., medications)     Daughter unsure about what meds have been given thus far but will find out. 8. IMMUNOCOMPROMISE: "Do you have of the following: diabetes, HIV positive,  splenectomy, cancer chemotherapy, chronic steroid treatment, transplant patient, etc."     Pt is immunocompromised at 89 yo with Alzheimers and other comorbidities such as cardiac diseases  Protocols used: Fever-A-AH, Common Cold-A-AH

## 2023-08-06 ENCOUNTER — Encounter: Payer: Self-pay | Admitting: Neurology

## 2023-08-07 ENCOUNTER — Other Ambulatory Visit: Payer: Self-pay

## 2023-08-07 MED ORDER — DIVALPROEX SODIUM 125 MG PO CSDR: DELAYED_RELEASE_CAPSULE | ORAL | 2 refills | Status: DC

## 2023-08-31 ENCOUNTER — Telehealth: Payer: Self-pay

## 2023-08-31 NOTE — Telephone Encounter (Signed)
TERRABELLA FORMS: COMMENTS: PLEASE FIND ATTACHED UPDATED PHYSICIAN AND DIET ORDERS TO BE SIGNED.

## 2023-09-04 ENCOUNTER — Encounter: Payer: Self-pay | Admitting: Neurology

## 2023-09-04 DIAGNOSIS — I7091 Generalized atherosclerosis: Secondary | ICD-10-CM | POA: Diagnosis not present

## 2023-09-04 DIAGNOSIS — B351 Tinea unguium: Secondary | ICD-10-CM | POA: Diagnosis not present

## 2023-09-07 ENCOUNTER — Other Ambulatory Visit: Payer: Self-pay

## 2023-09-07 ENCOUNTER — Telehealth: Payer: Self-pay | Admitting: Pharmacy Technician

## 2023-09-07 ENCOUNTER — Other Ambulatory Visit (HOSPITAL_COMMUNITY): Payer: Self-pay

## 2023-09-07 MED ORDER — DIVALPROEX SODIUM 125 MG PO CSDR: DELAYED_RELEASE_CAPSULE | ORAL | 2 refills | Status: DC

## 2023-09-07 NOTE — Telephone Encounter (Signed)
 Pharmacy Patient Advocate Encounter   Received notification from Physician's Office that prior authorization for DEPAKOTE  125MG  SPRINKLES is required/requested.   Insurance verification completed.   The patient is insured through [facility] [employer] .   Per test claim: The current 90 day co-pay is, $12.50.  No PA needed at this time. This test claim was processed through [facility]- copay amounts may vary at other pharmacies due to pharmacy/plan contracts, or as the patient moves through the different stages of their insurance plan.

## 2023-09-08 ENCOUNTER — Encounter: Payer: Self-pay | Admitting: Neurology

## 2023-09-08 ENCOUNTER — Ambulatory Visit: Payer: PPO | Admitting: Neurology

## 2023-09-08 VITALS — BP 144/81 | HR 50 | Ht 62.0 in | Wt 96.8 lb

## 2023-09-08 DIAGNOSIS — F03918 Unspecified dementia, unspecified severity, with other behavioral disturbance: Secondary | ICD-10-CM | POA: Diagnosis not present

## 2023-09-08 NOTE — Patient Instructions (Signed)
Good to see you! Continue all your medications. Follow-up in 6 months, call for any changes. 

## 2023-09-08 NOTE — Progress Notes (Signed)
 NEUROLOGY FOLLOW UP OFFICE NOTE  [name] [id number] 1926/02/05  HISTORY OF PRESENT ILLNESS: I had the pleasure of seeing [name] in follow-up in the neurology clinic on 09/08/2023.  The patient was last seen 7 months ago for mixed Alzheimer's and vascular dementia with behavioral disturbance. She is again accompanied by her daughter Katherine who supplements the history today. Since her last visit, [name] had contacted our office last month about increasing levels of anxiety and confusion when going off sit. She would think she is back in the wrong place, it upsets her so bad she would be crying and begging for help. Taking a nap may or may not help now. She is very antsy and paces the floor. Sometimes this I think I'm in the wrong place can occur even if she has not left Memory Care. Depakote  sprinkles dose was increased to 250mg  in AM, 500mg  in PM which has helped a lot. No side effects. She is on Mirtazapine  45mg  at bedtime for sleep, sleep is good. She denies any headaches, dizziness, vision changes, focal numbness/tingling/weakness, no falls with her walker. [name] reports swallowing difficulties, medications need to be taken with applesauce. She feels mood is okay most of the time. [name] reminds her she expressed concern about word-finding difficulties, she states this is not worrisome for her. She mostly stays in her room with a sitter. She does not participate in activities much.    PAST MEDICAL HISTORY: Past Medical History:  Diagnosis Date   Acute ischemic stroke 11/08/2018   left postcentral gyri; left frontal   Adjustment disorder with depressed mood 10/16/2019   Alteration consciousness 03/08/2020   Apraxia following cerebrovascular accident 10/16/2019   B12 deficiency 10/16/2019   Bilateral impacted cerumen 04/02/2021   Body mass index (BMI) less than 19 02/16/2020   Chronic kidney disease, stage II (mild) 12/06/2019   Closed fracture of one rib of right side with routine  healing 06/30/2017   Confusion    COPD (chronic obstructive pulmonary disease) 11/16/2017   Current use of long term anticoagulation 07/21/2017   Essential hypertension, benign 10/16/2019   First degree atrioventricular block 06/26/2017   Gait abnormality 09/24/2020   Hammer toe 06/26/2017   Hearing loss    Hyperlipidemia 06/26/2017   Laceration of right lower leg 06/18/2020   Major neurocognitive disorder due to Alzheimer's disease 08/16/2021   Malnutrition of moderate degree 12/06/2019   Memory loss    Other specified cardiac arrhythmias 06/26/2017   Pain in limb 06/26/2017   Palpitations 06/26/2017   Paroxysmal atrial fibrillation 06/26/2017   remote history post operatively, CHADS 2 score=2   Premature atrial complex 06/26/2017   Premature ventricular contractions 06/26/2017   PSVT (paroxysmal supraventricular tachycardia) 11/10/2017   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     divalproex  (DEPAKOTE  SPRINKLE) 125 MG capsule Take 2 capsule in AM, 4 capsules in PM 540 capsule 2   ELIQUIS  2.5 MG TABS tablet TAKE 1 TABLET(2.5 MG) BY MOUTH TWICE DAILY 180 tablet 1   midodrine  (PROAMATINE ) 5 MG tablet TAKE 1 TABLET (5 MG TOTAL) BY MOUTH 3 (THREE) TIMES DAILY WITH MEALS. 270 tablet 1  mirtazapine  (REMERON ) 45 MG tablet Take 1 tablet every night 90 tablet 3   Multiple Vitamins-Minerals (ALIVE WOMENS 50+ GUMMY) CHEW Chew by mouth.     pantoprazole  (PROTONIX ) 40 MG tablet Take 40 mg by mouth daily.     polyethylene glycol (MIRALAX  / GLYCOLAX ) 17 g packet Take 8.5 g by mouth daily. 1 each 0   No current facility-administered  medications on file prior to visit.    ALLERGIES: Allergies  Allergen Reactions   Codeine Nausea And Vomiting   Penicillins Rash   Sulfa Antibiotics Nausea And Vomiting    FAMILY HISTORY: Family History  Problem Relation Age of Onset   Bone cancer Mother    Breast cancer Father    Diabetes Father    Heart disease Father    Dementia Sister    Breast cancer Sister    Alzheimer's disease Sister    Dementia Brother     SOCIAL HISTORY: Social History   Socioeconomic History   Marital status: Widowed    Spouse name: Not on file   Number of children: 2   Years of education: 10   Highest education level: 12th grade  Occupational History   Occupation: Retired  Tobacco Use   Smoking status: Never    Passive exposure: Past   Smokeless tobacco: Never  Vaping Use   Vaping status: Never Used  Substance and Sexual Activity   Alcohol use: No   Drug use: No   Sexual activity: Not Currently  Other Topics Concern   Not on file  Social History Narrative   [name] is widowed. Her daughter, [name], is her POA and lives 12 miles away. She resides in her own home, alone for last 7 years. Since her CVA she has had someone with her most of the time. She has a hired caregiver or a family member stays most of the day. She feel safe in her home and in her neighborhood.   Right-handed.   0.5 cups caffeine per day.   One story home      Lives at [facility] [phone]   Social Drivers of Health   Financial Resource Strain: Low Risk  (07/27/2023)   Overall Financial Resource Strain (CARDIA)    Difficulty of Paying Living Expenses: Not hard at all  Food Insecurity: No Food Insecurity (07/27/2023)   Hunger Vital Sign    Worried About Running Out of Food in the Last Year: Never true    Ran Out of Food in the Last Year: Never true  Transportation Needs: No Transportation Needs (07/27/2023)   PRAPARE - Administrator, Civil Service (Medical): No    Lack  of Transportation (Non-Medical): No  Physical Activity: Inactive (07/27/2023)   Exercise Vital Sign    Days of Exercise per Week: 0 days    Minutes of Exercise per Session: 0 min  Stress: Patient Declined (06/25/2023)   [employer] of Occupational Health - Occupational Stress Questionnaire    Feeling of Stress : Patient declined  Social Connections: Moderately Integrated (07/27/2023)   Social Connection and Isolation Panel [NHANES]    Frequency of Communication with Friends and Family: More than three times a week    Frequency of Social Gatherings with Friends and Family: Three times a week    Attends Religious Services: 1 to 4 times per year    Active Member of Clubs or Organizations: No    Attends Banker Meetings: 1 to 4 times per year  Marital Status: Widowed  Recent Concern: Social Connections - Moderately Isolated (06/25/2023)   Social Connection and Isolation Panel [NHANES]    Frequency of Communication with Friends and Family: More than three times a week    Frequency of Social Gatherings with Friends and Family: Twice a week    Attends Religious Services: Never    Database Administrator or Organizations: Yes    Attends Banker Meetings: 1 to 4 times per year    Marital Status: Widowed  Intimate Partner Violence: Not At Risk (06/25/2023)   Humiliation, Afraid, Rape, and Kick questionnaire    Fear of Current or Ex-Partner: No    Emotionally Abused: No    Physically Abused: No    Sexually Abused: No     PHYSICAL EXAM: Vitals:   09/08/23 1006  BP: (!) 144/81  Pulse: (!) 50  SpO2: 99%   General: No acute distress Head:  Normocephalic/atraumatic Skin/Extremities: No rash, no edema Neurological Exam: alert and awake. No aphasia or dysarthria. Fund of knowledge is reduced, she looks to [place] when asked questions.  Recent and remote memory are impaired. Attention and concentration are normal.   Cranial nerves: Pupils equal, round.  Extraocular movements intact with no nystagmus. Visual fields full.  No facial asymmetry.  Motor: Bulk and tone normal, muscle strength 5/5 throughout with no pronator drift.   Finger to nose testing intact.  Gait with walker slow and cautious with knees bent, no ataxia. No tremors.    IMPRESSION: This is a pleasant 89 yo RH woman with atrial fibrillation on Eliquis , hyperlipidemia, stroke in 2020, with mixed Alzheimer's and vascular dementia with behavioral disturbance. She is in Memory Care with 24/7 care. She had a recent bout of worsening behavioral changes that quieted down with increase in Depakote  sprinkles 125mg : 2 caps in AM, 4 caps in PM sprinkled in applesauce. Sleep issues improved on Remeron  45mg  at bedtime. Continue supportive care. Follow-up in 6 months, call for any changes.    Thank you for allowing me to participate in her care.  Please do not hesitate to call for any questions or concerns.    Darice Shivers, M.D.   CC: Dr. Sirivol

## 2023-09-10 ENCOUNTER — Telehealth: Payer: Self-pay

## 2023-09-10 NOTE — Telephone Encounter (Signed)
 Copied from CRM [id number]. Topic: Clinical - Home Health Verbal Orders >> Sep 10, 2023 10:50 AM Jannette Spanner L wrote: Caller/Agency: Kern Alberta with [facility]  Callback Number: [phone] Service Requested: UA for a urinalysis

## 2023-09-10 NOTE — Telephone Encounter (Signed)
 Called [facility] and they stated they will bring sample from patient and drop off in office

## 2023-09-11 ENCOUNTER — Other Ambulatory Visit: Payer: Self-pay | Admitting: Family Medicine

## 2023-09-11 ENCOUNTER — Ambulatory Visit (INDEPENDENT_AMBULATORY_CARE_PROVIDER_SITE_OTHER): Payer: PPO

## 2023-09-11 DIAGNOSIS — N390 Urinary tract infection, site not specified: Secondary | ICD-10-CM | POA: Diagnosis not present

## 2023-09-11 LAB — POCT URINALYSIS DIP (CLINITEK)
Bilirubin, UA: NEGATIVE
Glucose, UA: NEGATIVE mg/dL
Ketones, POC UA: NEGATIVE mg/dL
Nitrite, UA: NEGATIVE
POC PROTEIN,UA: NEGATIVE
Spec Grav, UA: >=1.03 — AB (ref 1.010–1.025)
Urobilinogen, UA: 0.2 U/dL
pH, UA: 6 (ref 5.0–8.0)

## 2023-09-11 MED ORDER — NITROFURANTOIN 50 MG/5ML PO SUSP: 100.0000 mg | Freq: Two times a day (BID) | ORAL | 1 refills | Status: DC

## 2023-09-11 MED ORDER — NITROFURANTOIN MONOHYD MACRO 100 MG PO CAPS: 100.0000 mg | ORAL_CAPSULE | Freq: Every morning | ORAL | 0 refills | Status: DC

## 2023-09-11 NOTE — Progress Notes (Signed)
 Patients daughter brought in urine for a UA and Culture. antibiotics sent in per Lajuana Matte.Patient is in office today for a nurse visit for  UA . Patient UA was  positive for leukocytes.

## 2023-09-11 NOTE — Progress Notes (Signed)
 Patient is in office today for a nurse visit for Repeat UA. Patient UA was  positive for RBCs and positive for leukocytes  Provider made aware of results, urine culture sent.  Send patient in nitrofurantion 100 mg daily for 7 days. Order faxed over to [facility].

## 2023-09-11 NOTE — Telephone Encounter (Signed)
 Copied from CRM [id number]. Topic: Clinical - Prescription Issue >> Sep 11, 2023 11:01 AM Kinnie DEL wrote: Reason for CRM: Nitrofurantoin  50 MG/5ML SUSP is not covered by insurance in liquid form, need a different medication that's common for uti that insurance covers   09/11/23- New order in progress per chart review.

## 2023-09-12 LAB — URINE CULTURE

## 2023-10-06 ENCOUNTER — Telehealth: Payer: Self-pay

## 2023-10-06 NOTE — Telephone Encounter (Signed)
Spoke with Janelle from [facility] and she stated they need a discharge order for patient for Macrobid patient has finish medication  Order was faxed to [phone] as requested

## 2023-12-03 ENCOUNTER — Encounter: Payer: Self-pay | Admitting: Neurology

## 2023-12-09 ENCOUNTER — Other Ambulatory Visit: Payer: Self-pay | Admitting: Cardiology

## 2023-12-15 ENCOUNTER — Telehealth: Payer: Self-pay | Admitting: Cardiology

## 2023-12-15 NOTE — Telephone Encounter (Signed)
 Left tm to return call

## 2023-12-15 NOTE — Telephone Encounter (Signed)
 Patient daughter, [name] came by office and stated patient needs refill on medications. She states the patient (her mother) is suffering from severe dementia and is unable to leave the assisted living she is now in. Patient daughter is requesting a video or telephone visit so the pt can get her meds refilled. Please call daughter at [phone].

## 2023-12-16 ENCOUNTER — Other Ambulatory Visit: Payer: Self-pay | Admitting: Neurology

## 2023-12-16 ENCOUNTER — Other Ambulatory Visit: Payer: Self-pay | Admitting: Cardiology

## 2023-12-17 ENCOUNTER — Other Ambulatory Visit: Payer: Self-pay | Admitting: Cardiology

## 2023-12-17 NOTE — Telephone Encounter (Signed)
 Prescription refill request for Eliquis received.  Indication: afib  Last office visit: Marjory Signs, 02/20/2023 Scr: 0.79, 05/09/2023 Age: 89 yo  Weight: 43.9 kg   Refill sent.

## 2023-12-21 MED ORDER — MIDODRINE HCL 5 MG PO TABS: 5.0000 mg | ORAL_TABLET | Freq: Three times a day (TID) | ORAL | 0 refills | Status: DC

## 2023-12-21 NOTE — Addendum Note (Signed)
 Addended by: Shaleka Brines P on: 12/21/2023 10:36 AM   Modules accepted: Orders

## 2024-01-04 NOTE — Progress Notes (Unsigned)
 Cardiology Office Note:    Date:  01/05/2024   ID:  [name], DOB Sep 23, 1925, MRN [id number]  PCP:  Sirivol, Mamatha, MD   [facility] Providers Cardiologist:  Katherine Hinds, MD     Referring MD: Sirivol, Mamatha, MD    History of Present Illness:    [name] is a 89 y.o. female with a hx of neurogenic orthostatic hypotension, hypertension, PSVT, first-degree AV block, CVA, PAF anticoagulated on Eliquis  CHA2DS2-VASc score of 7, thoracic aortic atherosclerosis, PVCs, PACs, COPD, Alzheimer's disease, CKD, acquired thrombophilia, hyperlipidemia.  Monitor in September of 2021 predominant SR, no pauses > 3 seconds. Two instances of Mobitz 1, rare VE, SVT burden > 7% with frequent runs of APCs, no afib or flutter Echo in March 2020 echo 55-60%, mild thickening of MV leaflet, mild mitral annular calcification, mod TR, mild calcification of AV  She is a longstanding patient of Dr. Sandee Singh, treated for atrial fibrillation.  Most recently she was evaluated by Dr. Sandee Singh on 08/19/2022, she continued to be bothered by orthostasis the decision was to increase her midodrine  to have 3 times daily.  Most recently evaluated by myself in June 2024, she had recently had numerous falls and we discussed discontinuing her Eliquis  however the previous time it was discontinued she suffered an ischemic stroke in the family preferred to continue her Eliquis .  No changes were made to her plan of care and she was advised to follow-up in 6 months.  She presents today accompanied by her daughter for follow-up of her atrial fibrillation.  Most of the history is provided by her daughter, she is residing in a memory care unit.  She has only had 1 fall recently.  She had previously been following with Dr. Randal Singh for anemia, but she denies any obvious blood loss.  We previously discussed discontinuation of Eliquis  secondary to frequent falls and frailty however she is only recently had 1 fall and is doing  relatively well, she also had an ischemic stroke previously when her Eliquis  was discontinued to her family prefers for her to continue on Eliquis . She denies chest pain, palpitations, dyspnea, pnd, orthopnea, n, v, dizziness, syncope, edema, weight gain, or early satiety.   Past Medical History:  Diagnosis Date   Acute ischemic stroke 11/08/2018   left postcentral gyri; left frontal   Adjustment disorder with depressed mood 10/16/2019   Alteration consciousness 03/08/2020   Apraxia following cerebrovascular accident 10/16/2019   B12 deficiency 10/16/2019   Bilateral impacted cerumen 04/02/2021   Body mass index (BMI) less than 19 02/16/2020   Chronic kidney disease, stage II (mild) 12/06/2019   Closed fracture of one rib of right side with routine healing 06/30/2017   Confusion    COPD (chronic obstructive pulmonary disease) 11/16/2017   Current use of long term anticoagulation 07/21/2017   Essential hypertension, benign 10/16/2019   First degree atrioventricular block 06/26/2017   Gait abnormality 09/24/2020   Hammer toe 06/26/2017   Hearing loss    Hyperlipidemia 06/26/2017   Laceration of right lower leg 06/18/2020   Major neurocognitive disorder due to Alzheimer's disease 08/16/2021   Malnutrition of moderate degree 12/06/2019   Memory loss    Other specified cardiac arrhythmias 06/26/2017   Pain in limb 06/26/2017   Palpitations 06/26/2017   Paroxysmal atrial fibrillation 06/26/2017   remote history post operatively, CHADS 2 score=2   Premature atrial complex 06/26/2017   Premature ventricular contractions 06/26/2017   PSVT (paroxysmal supraventricular tachycardia) 11/10/2017  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   acetaminophen  (TYLENOL ) 325 MG tablet Take 325 mg by mouth as needed.   divalproex  (DEPAKOTE  SPRINKLE) 125 MG capsule Take 2 capsule in AM, 4 capsules in PM   ELIQUIS  2.5 MG TABS tablet TAKE 1 TABLET(2.5 MG) BY MOUTH TWICE DAILY   midodrine  (PROAMATINE ) 5 MG tablet Take 1 tablet (5 mg total) by mouth 3 (three) times daily with meals.   mirtazapine  (REMERON ) 45 MG tablet TAKE 1 TABLET BY MOUTH EVERY DAY AT NIGHT   Multiple Vitamins-Minerals (ALIVE WOMENS 50+ GUMMY) CHEW Chew by mouth.   pantoprazole  (PROTONIX ) 40 MG tablet Take 40 mg by mouth daily.   polyethylene glycol (MIRALAX  / GLYCOLAX ) 17 g packet Take 8.5 g by mouth daily.     Allergies:   Codeine, Penicillins, and Sulfa antibiotics   Social History   Socioeconomic History   Marital status: Widowed    Spouse name: Not on file   Number of children: 2   Years of education: 66   Highest education level: 12th grade  Occupational History   Occupation: Retired  Tobacco Use   Smoking status: Never    Passive exposure: Past   Smokeless tobacco: Never  Vaping Use   Vaping status: Never Used  Substance and Sexual Activity   Alcohol use: No   Drug use: No   Sexual activity: Not Currently  Other Topics Concern   Not on file  Social History Narrative   [name] is widowed. Her daughter, [name], is her POA and lives 12 miles away. She resides in her own home, alone for last 7 years. Since her CVA she has had someone with her most of the time. She has a hired caregiver or a family member stays most of the day. She feel safe in her home and in her neighborhood.   Right-handed.   0.5 cups caffeine per day.   One story home      Lives at [name] [phone]   Social Drivers of Health   Financial Resource Strain: Low Risk  (07/27/2023)   Overall Financial Resource Strain  (CARDIA)    Difficulty of Paying Living Expenses: Not hard at all  Food Insecurity: No Food Insecurity (07/27/2023)   Hunger Vital Sign    Worried About Running Out of Food in the Last Year: Never true    Ran Out of Food in the Last Year: Never true  Transportation Needs: No Transportation Needs (07/27/2023)   PRAPARE - Administrator, Civil Service (Medical): No    Lack of Transportation (Non-Medical): No  Physical Activity: Inactive (07/27/2023)   Exercise Vital Sign    Days of Exercise per Week: 0 days    Minutes of Exercise per Session: 0 min  Stress: Patient Declined (06/25/2023)   [employer] of Occupational Health - Occupational Stress Questionnaire    Feeling of Stress : Patient declined  Social Connections: Moderately Integrated (07/27/2023)   Social Connection and Isolation Panel [NHANES]    Frequency of Communication with Friends and Family: More than three  times a week    Frequency of Social Gatherings with Friends and Family: Three times a week    Attends Religious Services: 1 to 4 times per year    Active Member of Clubs or Organizations: No    Attends Banker Meetings: 1 to 4 times per year    Marital Status: Widowed  Recent Concern: Social Connections - Moderately Isolated (06/25/2023)   Social Connection and Isolation Panel [NHANES]    Frequency of Communication with Friends and Family: More than three times a week    Frequency of Social Gatherings with Friends and Family: Twice a week    Attends Religious Services: Never    Database administrator or Organizations: Yes    Attends Banker Meetings: 1 to 4 times per year    Marital Status: Widowed     Family History: The patient's family history includes Alzheimer's disease in her sister; Bone cancer in her mother; Breast cancer in her father and sister; Dementia in her brother and sister; Diabetes in her father; Heart disease in her father.  ROS:   Please see the  history of present illness.     All other systems reviewed and are negative.  EKGs/Labs/Other Studies Reviewed:    The following studies were reviewed today: Cardiac Studies & Procedures   ______________________________________________________________________________________________     ECHOCARDIOGRAM  ECHOCARDIOGRAM COMPLETE 11/09/2018  Narrative ECHOCARDIOGRAM REPORT    Patient Name:   [name] Date of Exam: 11/09/2018 Medical Rec #:  [id number]       Height:       63.0 in Accession #:    [id number]      Weight:       104.1 lb Date of Birth:  06-03-1926       BSA:          1.46 m Patient Age:    89 years        BP:           135/85 mmHg Patient Gender: F               HR:           92 bpm. Exam Location:  Inpatient   Procedure: 2D Echo  Indications:    stroke 434.91  History:        Patient has no prior history of Echocardiogram examinations. Atrial Fibrillation.  Sonographer:    Dione Franks Referring Phys: [id number] ASHISH ARORA  IMPRESSIONS   1. The left ventricle has normal systolic function, with an ejection fraction of 55-60%. The cavity size was normal. Left ventricular diastolic function could not be evaluated secondary to atrial fibrillation. 2. Left atrial size was moderately dilated. 3. Right atrial size was mildly dilated. 4. Mild thickening of the mitral valve leaflet. There is mild mitral annular calcification present. 5. Tricuspid valve regurgitation is moderate. 6. The aortic valve is tricuspid Mild calcification of the aortic valve. 7. Normal LV function; biatrial enlargement; mild MR; moderate TR; mildly elevated pulmonary pressure.  FINDINGS Left Ventricle: The left ventricle has normal systolic function, with an ejection fraction of 55-60%. The cavity size was normal. There is no increase in left ventricular wall thickness. Left ventricular diastolic function could not be evaluated secondary to atrial fibrillation. Right Ventricle: The  right ventricle has normal systolic function. The cavity was normal. Right ventricular systolic pressure is mildly elevated with an estimated pressure of 35.3 mmHg. Left Atrium: left atrial size was moderately dilated Right  Atrium: right atrial size was mildly dilated. Right atrial pressure is estimated at 3 mmHg. Interatrial Septum: No atrial level shunt detected by color flow Doppler. Pericardium: There is no evidence of pericardial effusion. Mitral Valve: The mitral valve is normal in structure. Mild thickening of the mitral valve leaflet. There is mild mitral annular calcification present. Mitral valve regurgitation is mild by color flow Doppler. Tricuspid Valve: The tricuspid valve is normal in structure. Tricuspid valve regurgitation is moderate by color flow Doppler. Aortic Valve: The aortic valve is tricuspid Mild calcification of the aortic valve. Aortic valve regurgitation was not visualized by color flow Doppler. Pulmonic Valve: The pulmonic valve was grossly normal. Pulmonic valve regurgitation is trivial by color flow Doppler. Venous: The inferior vena cava is normal in size with greater than 50% respiratory variability.  LEFT VENTRICLE PLAX 2D LVIDd:         4.00 cm LVIDs:         2.70 cm LV PW:         0.60 cm LV IVS:        0.70 cm LVOT diam:     2.00 cm LV SV:         43 ml LV SV Index:   29.81 LVOT Area:     3.14 cm  RIGHT VENTRICLE TAPSE (M-mode): 1.2 cm RVSP:           35.3 mmHg  LEFT ATRIUM             Index       RIGHT ATRIUM           Index LA diam:        4.00 cm 2.73 cm/m  RA Pressure: 3 mmHg LA Vol (A2C):   53.0 ml 36.18 ml/m RA Area:     17.00 cm LA Vol (A4C):   55.8 ml 38.09 ml/m RA Volume:   45.50 ml  31.06 ml/m LA Biplane Vol: 58.1 ml 39.66 ml/m AORTIC VALVE LVOT Vmax:   74.90 cm/s LVOT Vmean:  51.000 cm/s LVOT VTI:    0.157 m  AORTA Ao Root diam: 3.10 cm  TRICUSPID VALVE TR Peak grad:   32.3 mmHg TR Vmax:        284.00 cm/s RVSP:            35.3 mmHg  SHUNTS Systemic VTI:  0.16 m Systemic Diam: 2.00 cm   Alexandria Angel MD Electronically signed by Alexandria Angel MD Signature Date/Time: 11/09/2018/5:26:04 PM    Final    MONITORS  LONG TERM MONITOR (3-14 DAYS) 05/18/2020  Narrative A ZIO monitor was performed 13 days 21 hours beginning 04/24/2020 to assess for atrial fibrillation.  Predominant cardiac rhythm with sinus with average, minimum and maximum heart rates of 73, 53 and 121 bpm.  There were no pauses of 3 seconds or greater and no episodes of Mobitz 2 second or third-degree AV nodal block or sinus node exit block.  2 instances of Mobitz 1 second-degree AV block were noted.  Ventricular ectopy was rare with isolated PVCs and couplets and one 4 beat run of regular premature contractions.  Supraventricular ectopy was frequent with a burden exceeding 7% with frequent runs of atrial premature contraction longest 9 complexes a rate of 100 bpm.  There were no episodes of atrial fibrillation or flutter.  There were no triggered or diary events.   Conclusion, frequent supraventricular ectopy but no episodes of atrial fibrillation or flutter.  Rare episodes of Mobitz 1 second-degree AV  block were noted, total of 2.       ______________________________________________________________________________________________          EKG today, 1st AV block, HR 95 bpm Recent Labs: 01/27/2023: ALT 5; BUN 24; Creatinine, Ser 0.71; Hemoglobin 11.8; Platelets 260; Potassium 4.9; Sodium 140; TSH 2.190  Recent Lipid Panel    Component Value Date/Time   CHOL 204 (H) 10/01/2022 1049   TRIG 113 10/01/2022 1049   HDL 46 10/01/2022 1049   CHOLHDL 4.4 10/01/2022 1049   CHOLHDL 4.3 11/09/2018 0512   VLDL 11 11/09/2018 0512   LDLCALC 138 (H) 10/01/2022 1049     Risk Assessment/Calculations:    CHA2DS2-VASc Score = 7   This indicates a 11.2% annual risk of stroke. The patient's score is based upon: CHF History: 0 HTN  History: 1 Diabetes History: 0 Stroke History: 2 Vascular Disease History: 1 Age Score: 2 Gender Score: 1               Physical Exam:    VS:  BP 106/70   Pulse 75   Ht 5\' 2"  (1.575 m)   Wt 96 lb 12.8 oz (43.9 kg)   LMP  (LMP Unknown)   SpO2 97%   BMI 17.70 kg/m     Wt Readings from Last 3 Encounters:  01/05/24 96 lb 12.8 oz (43.9 kg)  09/08/23 96 lb 12.8 oz (43.9 kg)  07/28/23 92 lb 9.6 oz (42 kg)     GEN: Frail, cachectic, no acute distress HEENT: Normal NECK: No JVD; No carotid bruits LYMPHATICS: No lymphadenopathy CARDIAC: RRR, no murmurs, rubs, gallops RESPIRATORY:  Clear to auscultation without rales, wheezing or rhonchi  ABDOMEN: Soft, non-tender, non-distended MUSCULOSKELETAL:  No edema; No deformity  SKIN: Warm and dry NEUROLOGIC:  Alert and oriented x 3 PSYCHIATRIC:  Normal affect   ASSESSMENT:    1. Paroxysmal atrial fibrillation (HCC)   2. Neurogenic orthostatic hypotension (HCC)   3. Current use of long term anticoagulation   4. Falls frequently     PLAN:    In order of problems listed above:  PAF-currently on reduced dose Eliquis  2.5 mg twice daily based on age and weight, CHA2DS2-VASc score of 7.  Previously when her Eliquis  was stopped, she suffered an ischemic stroke and this is more devastating to the family than the potential that she could fall and have suffer a hemorrhage event.  Will repeat CBC and BMET.  Continue Sectral  200 mg daily.  Neurogenic orthostatic hypotension-continue midodrine  5 mg 3 times daily, they state her blood pressure has been predominantly 106/70 and she seems to be doing better with this.  Frequent falls-falls have really decreased with frequency, daughter can only recall 1 episode over the last few months.   Disposition-is very taxing to get the family to her appointments, they request to only follow-up once a year.  Discussed that typically we like to check a CBC on patients who are on Eliquis  twice/year, they  are deciding whether they might sign up with primary care through her current facility.  For now, follow-up in 1 year, CBC, BMET today.        Medication Adjustments/Labs and Tests Ordered: Current medicines are reviewed at length with the patient today.  Concerns regarding medicines are outlined above.  Orders Placed This Encounter  Procedures   EKG 12-Lead   No orders of the defined types were placed in this encounter.   There are no Patient Instructions on file for this visit.   Signed,  Terrance Ferretti, NP  01/05/2024 11:48 AM    [facility]

## 2024-01-05 ENCOUNTER — Encounter: Payer: Self-pay | Admitting: Cardiology

## 2024-01-05 ENCOUNTER — Ambulatory Visit: Attending: Cardiology | Admitting: Cardiology

## 2024-01-05 VITALS — BP 106/70 | HR 75 | Ht 62.0 in | Wt 96.8 lb

## 2024-01-05 DIAGNOSIS — R296 Repeated falls: Secondary | ICD-10-CM

## 2024-01-05 DIAGNOSIS — Z7901 Long term (current) use of anticoagulants: Secondary | ICD-10-CM

## 2024-01-05 DIAGNOSIS — G903 Multi-system degeneration of the autonomic nervous system: Secondary | ICD-10-CM

## 2024-01-05 DIAGNOSIS — I480 Paroxysmal atrial fibrillation: Secondary | ICD-10-CM | POA: Diagnosis not present

## 2024-01-05 NOTE — Patient Instructions (Signed)
 Medication Instructions:  Your physician recommends that you continue on your current medications as directed. Please refer to the Current Medication list given to you today.  *If you need a refill on your cardiac medications before your next appointment, please call your pharmacy*  Lab Work: Your physician recommends that you return for lab work in:   Labs today: CMP, CBC  If you have labs (blood work) drawn today and your tests are completely normal, you will receive your results only by: MyChart Message (if you have MyChart) OR A paper copy in the mail If you have any lab test that is abnormal or we need to change your treatment, we will call you to review the results.  Testing/Procedures: None  Follow-Up: At [facility], you and your health needs are our priority.  As part of our continuing mission to provide you with exceptional heart care, our providers are all part of one team.  This team includes your primary Cardiologist (physician) and Advanced Practice Providers or APPs (Physician Assistants and Nurse Practitioners) who all work together to provide you with the care you need, when you need it.  Your next appointment:   1 year(s)  Provider:   Pattricia Bores, NP Georgeana Kindler)    We recommend signing up for the patient portal called "MyChart".  Sign up information is provided on this After Visit Summary.  MyChart is used to connect with patients for Virtual Visits (Telemedicine).  Patients are able to view lab/test results, encounter notes, upcoming appointments, etc.  Non-urgent messages can be sent to your provider as well.   To learn more about what you can do with MyChart, go to [email or web address].   Other Instructions None

## 2024-01-06 LAB — CBC
Hematocrit: 36.2 % (ref 34.0–46.6)
Hemoglobin: 12.4 g/dL (ref 11.1–15.9)
MCH: 34.8 pg — ABNORMAL HIGH (ref 26.6–33.0)
MCHC: 34.3 g/dL (ref 31.5–35.7)
MCV: 102 fL — ABNORMAL HIGH (ref 79–97)
Platelets: 272 x10E3/uL (ref 150–450)
RBC: 3.56 x10E6/uL — ABNORMAL LOW (ref 3.77–5.28)
RDW: 12.7 % (ref 11.7–15.4)
WBC: 7.7 x10E3/uL (ref 3.4–10.8)

## 2024-01-06 LAB — COMPREHENSIVE METABOLIC PANEL WITH GFR
ALT: 8 IU/L (ref 0–32)
AST: 18 IU/L (ref 0–40)
Albumin: 4 g/dL (ref 3.6–4.6)
Alkaline Phosphatase: 78 IU/L (ref 44–121)
BUN/Creatinine Ratio: 24 (ref 12–28)
BUN: 22 mg/dL (ref 10–36)
Bilirubin Total: 0.3 mg/dL (ref 0.0–1.2)
CO2: 24 mmol/L (ref 20–29)
Calcium: 9.3 mg/dL (ref 8.7–10.3)
Chloride: 101 mmol/L (ref 96–106)
Creatinine, Ser: 0.91 mg/dL (ref 0.57–1.00)
Globulin, Total: 3 g/dL (ref 1.5–4.5)
Glucose: 87 mg/dL (ref 70–99)
Potassium: 4.6 mmol/L (ref 3.5–5.2)
Sodium: 141 mmol/L (ref 134–144)
Total Protein: 7 g/dL (ref 6.0–8.5)
eGFR: 58 mL/min/1.73 — ABNORMAL LOW

## 2024-01-07 ENCOUNTER — Encounter (HOSPITAL_COMMUNITY): Payer: Self-pay

## 2024-01-07 ENCOUNTER — Other Ambulatory Visit: Payer: Self-pay

## 2024-01-07 MED ORDER — FOSFOMYCIN TROMETHAMINE 3 G PO PACK: 3.0000 g | PACK | Freq: Once | ORAL | 2 refills | Status: AC

## 2024-01-07 MED ORDER — DOXYCYCLINE HYCLATE 100 MG PO TABS
100.0000 mg | ORAL_TABLET | Freq: Two times a day (BID) | ORAL | 0 refills | Status: AC
Start: 2024-01-07 — End: 2024-01-14

## 2024-01-08 ENCOUNTER — Other Ambulatory Visit: Payer: Self-pay | Admitting: Cardiology

## 2024-01-08 DIAGNOSIS — F03911 Unspecified dementia, unspecified severity, with agitation: Secondary | ICD-10-CM | POA: Diagnosis not present

## 2024-01-08 DIAGNOSIS — R2689 Other abnormalities of gait and mobility: Secondary | ICD-10-CM | POA: Diagnosis not present

## 2024-01-08 DIAGNOSIS — B351 Tinea unguium: Secondary | ICD-10-CM | POA: Diagnosis not present

## 2024-01-08 DIAGNOSIS — R238 Other skin changes: Secondary | ICD-10-CM | POA: Diagnosis not present

## 2024-01-08 DIAGNOSIS — R262 Difficulty in walking, not elsewhere classified: Secondary | ICD-10-CM | POA: Diagnosis not present

## 2024-01-08 DIAGNOSIS — M6281 Muscle weakness (generalized): Secondary | ICD-10-CM | POA: Diagnosis not present

## 2024-01-08 DIAGNOSIS — I872 Venous insufficiency (chronic) (peripheral): Secondary | ICD-10-CM | POA: Diagnosis not present

## 2024-01-14 ENCOUNTER — Ambulatory Visit (INDEPENDENT_AMBULATORY_CARE_PROVIDER_SITE_OTHER): Payer: PPO

## 2024-01-14 VITALS — BP 96/64 | HR 87 | Temp 97.5°F | Ht 62.0 in | Wt 95.2 lb

## 2024-01-14 DIAGNOSIS — N390 Urinary tract infection, site not specified: Secondary | ICD-10-CM

## 2024-01-14 DIAGNOSIS — I10 Essential (primary) hypertension: Secondary | ICD-10-CM | POA: Diagnosis not present

## 2024-01-14 DIAGNOSIS — E538 Deficiency of other specified B group vitamins: Secondary | ICD-10-CM

## 2024-01-14 DIAGNOSIS — J449 Chronic obstructive pulmonary disease, unspecified: Secondary | ICD-10-CM | POA: Diagnosis not present

## 2024-01-14 DIAGNOSIS — E7800 Pure hypercholesterolemia, unspecified: Secondary | ICD-10-CM

## 2024-01-14 DIAGNOSIS — F332 Major depressive disorder, recurrent severe without psychotic features: Secondary | ICD-10-CM | POA: Diagnosis not present

## 2024-01-14 MED ORDER — PSYLLIUM 25 % PO POWD: ORAL | 1 refills | Status: DC

## 2024-01-14 NOTE — Assessment & Plan Note (Signed)
 Recommend continue to work on eating healthy diet and exercise. Not on a cholesterol lowering medication. Would not check blood work or recommend medication given her age. Moreover her last blood work last year showed normal lipids.

## 2024-01-14 NOTE — Assessment & Plan Note (Signed)
 Well controlled.  In fact, on Midodrine  5 mg three times daily No changes to medicines. Acetabutolol 200 mg every other day. Continue to work on eating a healthy diet and exercise.

## 2024-01-14 NOTE — Patient Instructions (Signed)
  VISIT SUMMARY: Today, we discussed your recent urinary tract infections, bowel irregularities, blood pressure management, and atrial fibrillation. We reviewed your current medications and made some adjustments to help manage your symptoms better.  YOUR PLAN: URINARY TRACT INFECTION: You have had recurrent urinary tract infections, with the most recent one treated with doxycycline . Your symptoms have improved, but you still experience some disorientation when outside. -Ensure proper collection of clean catch urine samples before treating future UTIs. -Avoid frequent antibiotic use unless a symptomatic infection is confirmed.  CONSTIPATION AND DIARRHEA: You experience alternating constipation and diarrhea, which may coincide with UTIs. Your reluctance to drink fluids contributes to constipation. -Take psyllium husk fiber supplement mixed with juice, one teaspoon daily. -Monitor your bowel movements and adjust fiber intake as needed.  HYPOTENSION: Your blood pressure has been fluctuating, leading to recent episodes of low blood pressure. -Continue taking midodrine  as prescribed.  ATRIAL FIBRILLATION: Your atrial fibrillation is well-controlled with your current medication. -Continue taking Eliquis  2.5 mg twice daily. -Perform an annual EKG to monitor your condition.    Contains text generated by [employer]bridge.     Contains text generated by [employer]bridge.

## 2024-01-14 NOTE — Assessment & Plan Note (Signed)
 On multivitamin. Normal b12 last year Defer blood work due to age

## 2024-01-14 NOTE — Assessment & Plan Note (Signed)
 Stable No inhalers on board. Former remote exposure to tobacco.

## 2024-01-14 NOTE — Progress Notes (Signed)
 Subjective:  Patient ID: [name], female    DOB: [birth year]  Age: 89 y.o. MRN: [id number]  Chief Complaint  Patient presents with   Medical Management of Chronic Issues    HPI: Discussed the use of AI scribe software for clinical note transcription with the patient, who gave verbal consent to proceed.  History of Present Illness   [name] "[name]" is a 89 year old female who presents for follow-up and to discuss regarding urinary tract infections and medication management. She is accompanied by her daughter and her companion, Felipa Horsfall.  She has been experiencing recurrent urinary tract infections (UTIs). Recently, she was treated with doxycycline  after an initial prescription of fosfomycin was not covered by her insurance. Her symptoms of disorientation improved with the antibiotic treatment, although she still experiences some disorientation when outside. The last UTI treatment was about a week ago, and prior to that, it had been a few months since her last infection.  She is currently on several medications including Eliquis  2.5 mg twice daily for atrial fibrillation, acebutolol , and midodrine  for blood pressure management. Her daughter expressed concern about the frequency of acebutolol  refills, preferring a 90-day supply. Her blood pressure has been fluctuating, leading to the recent addition of midodrine  to stabilize it.  She has a history of anemia, but recent blood work on May 6th showed normal hemoglobin levels at 12.4. Her electrolytes, kidney function, liver numbers, and CBC were all within normal limits.  She experiences bowel irregularities, alternating between constipation and diarrhea, which may coincide with UTIs. She does not drink fluids willingly, which complicates her bowel management. She is given Miralax  and a fiber gummy supplement to help manage her bowel movements.  In terms of her daily activities, she is able to walk with a walker and can go to the  bathroom by herself, although sometimes she requires assistance with cleaning. She has no significant pain and reports sleeping well. She eats a typical breakfast of oatmeal, bacon, scrambled eggs, toast, and coffee.         01/14/2024   10:59 AM 06/25/2023    9:41 AM 05/22/2023   11:12 AM 01/27/2023   11:07 AM 10/29/2021    1:26 PM  Depression screen PHQ 2/9  Decreased Interest 0 2 0 3 1  Down, Depressed, Hopeless 0 2 0 3 1  PHQ - 2 Score 0 4 0 6 2  Altered sleeping  0 0 3 0  Tired, decreased energy  0 2 3 1   Change in appetite  3 1 3 3   Feeling bad or failure about yourself   0 0 0 1  Trouble concentrating  3 0 3 0  Moving slowly or fidgety/restless  1  3 0  Suicidal thoughts  0 0 -- 0  PHQ-9 Score  11 3 21 7   Difficult doing work/chores  Somewhat difficult Not difficult at all Extremely dIfficult         01/14/2024   10:59 AM  Fall Risk   Falls in the past year? 1  Number falls in past yr: 1  Injury with Fall? 0  Risk for fall due to : History of fall(s)    Patient Care Team: Dawnya Grams, MD as PCP - General (Family Medicine) Hassan Links, MD as PCP - Cardiology (Cardiology) Etter Hermann., MD as Consulting Physician (Family Medicine) Hassan Links, MD as Consulting Physician (Cardiology) Jhonny Moss, MD as Consulting Physician (Neurology)   Review of  Systems  Constitutional:  Negative for chills, fatigue and fever.  HENT:  Negative for congestion, ear pain, sinus pressure and sore throat.   Respiratory:  Negative for cough and shortness of breath.   Cardiovascular:  Negative for chest pain.  Gastrointestinal:  Positive for constipation and diarrhea. Negative for abdominal pain, nausea and vomiting.  Genitourinary:  Negative for dysuria and frequency.  Musculoskeletal:  Negative for arthralgias, back pain and myalgias.  Neurological:  Negative for dizziness and headaches.  Psychiatric/Behavioral:  Negative for dysphoric mood. The patient is not  nervous/anxious.     Current Outpatient Medications on File Prior to Visit  Medication Sig Dispense Refill   acebutolol  (SECTRAL ) 200 MG capsule Take 1 capsule (200 mg total) by mouth every other day. 15 capsule 1   acetaminophen  (TYLENOL ) 325 MG tablet Take 325 mg by mouth as needed.     divalproex  (DEPAKOTE  SPRINKLE) 125 MG capsule Take 2 capsule in AM, 4 capsules in PM 540 capsule 2   doxycycline  (VIBRA -TABS) 100 MG tablet Take 1 tablet (100 mg total) by mouth 2 (two) times daily for 7 days. 14 tablet 0   ELIQUIS  2.5 MG TABS tablet TAKE 1 TABLET(2.5 MG) BY MOUTH TWICE DAILY 180 tablet 1   midodrine  (PROAMATINE ) 5 MG tablet Take 1 tablet (5 mg total) by mouth 3 (three) times daily with meals. 180 tablet 0   mirtazapine  (REMERON ) 45 MG tablet TAKE 1 TABLET BY MOUTH EVERY DAY AT NIGHT 90 tablet 0   Multiple Vitamins-Minerals (ALIVE WOMENS 50+ GUMMY) CHEW Chew by mouth.     pantoprazole  (PROTONIX ) 40 MG tablet Take 40 mg by mouth daily.     polyethylene glycol (MIRALAX  / GLYCOLAX ) 17 g packet Take 8.5 g by mouth daily. 1 each 0   No current facility-administered medications on file prior to visit.   Past Medical History:  Diagnosis Date   Acute ischemic stroke 11/08/2018   left postcentral gyri; left frontal   Adjustment disorder with depressed mood 10/16/2019   Alteration consciousness 03/08/2020   Apraxia following cerebrovascular accident 10/16/2019   B12 deficiency 10/16/2019   Bilateral impacted cerumen 04/02/2021   Body mass index (BMI) less than 19 02/16/2020   Chronic kidney disease, stage II (mild) 12/06/2019   Closed fracture of one rib of right side with routine healing 06/30/2017   Confusion    COPD (chronic obstructive pulmonary disease) 11/16/2017   Current use of long term anticoagulation 07/21/2017   Essential hypertension, benign 10/16/2019   First degree atrioventricular block 06/26/2017   Gait abnormality 09/24/2020   Hammer toe 06/26/2017   Hearing loss     Hyperlipidemia 06/26/2017   Laceration of right lower leg 06/18/2020   Major neurocognitive disorder due to Alzheimer's disease 08/16/2021   Malnutrition of moderate degree 12/06/2019   Memory loss    Other specified cardiac arrhythmias 06/26/2017   Pain in limb 06/26/2017   Palpitations 06/26/2017   Paroxysmal atrial fibrillation 06/26/2017   remote history post operatively, CHADS 2 score=2   Premature atrial complex 06/26/2017   Premature ventricular contractions 06/26/2017   PSVT (paroxysmal supraventricular tachycardia) 11/10/2017   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 10/16/2019   Thoracic aorta atherosclerosis 05/22/2020   Traumatic pneumothorax 06/30/2017   Past Surgical History:  Procedure Laterality Date   ABDOMINAL HYSTERECTOMY     HIP SURGERY  04/2022   SHOULDER SURGERY     TONSILLECTOMY      Family History  Problem Relation Age of Onset   Bone  cancer Mother    Breast cancer Father    Diabetes Father    Heart disease Father    Dementia Sister    Breast cancer Sister    Alzheimer's disease Sister    Dementia Brother    Social History   Socioeconomic History   Marital status: Widowed    Spouse name: Not on file   Number of children: 2   Years of education: 49   Highest education level: 12th grade  Occupational History   Occupation: Retired  Tobacco Use   Smoking status: Never    Passive exposure: Past   Smokeless tobacco: Never  Vaping Use   Vaping status: Never Used  Substance and Sexual Activity   Alcohol use: No   Drug use: No   Sexual activity: Not Currently  Other Topics Concern   Not on file  Social History Narrative   Mrs. Hogenson is widowed. Her daughter, Armenta Bernheim, is her POA and lives 12 miles away. She resides in her own home, alone for last 7 years. Since her CVA she has had someone with her most of the time. She has a hired caregiver or a family member stays most of the day. She feel safe in her home and in her neighborhood.    Right-handed.   0.5 cups caffeine per day.   One story home      Lives at [place] [place] [phone]   Social Drivers of Health   Financial Resource Strain: Low Risk  (07/27/2023)   Overall Financial Resource Strain (CARDIA)    Difficulty of Paying Living Expenses: Not hard at all  Food Insecurity: No Food Insecurity (07/27/2023)   Hunger Vital Sign    Worried About Running Out of Food in the Last Year: Never true    Ran Out of Food in the Last Year: Never true  Transportation Needs: No Transportation Needs (07/27/2023)   PRAPARE - Administrator, Civil Service (Medical): No    Lack of Transportation (Non-Medical): No  Physical Activity: Inactive (07/27/2023)   Exercise Vital Sign    Days of Exercise per Week: 0 days    Minutes of Exercise per Session: 0 min  Stress: Patient Declined (06/25/2023)   [employer] of Occupational Health - Occupational Stress Questionnaire    Feeling of Stress : Patient declined  Social Connections: Moderately Integrated (07/27/2023)   Social Connection and Isolation Panel [NHANES]    Frequency of Communication with Friends and Family: More than three times a week    Frequency of Social Gatherings with Friends and Family: Three times a week    Attends Religious Services: 1 to 4 times per year    Active Member of Clubs or Organizations: No    Attends Banker Meetings: 1 to 4 times per year    Marital Status: Widowed  Recent Concern: Social Connections - Moderately Isolated (06/25/2023)   Social Connection and Isolation Panel [NHANES]    Frequency of Communication with Friends and Family: More than three times a week    Frequency of Social Gatherings with Friends and Family: Twice a week    Attends Religious Services: Never    Database administrator or Organizations: Yes    Attends Banker Meetings: 1 to 4 times per year    Marital Status: Widowed    Objective:  BP 96/64   Pulse 87   Temp (!)  97.5 F (36.4 C)   Ht 5\' 2"  (1.575 m)  Wt 95 lb 3.2 oz (43.2 kg)   LMP  (LMP Unknown)   SpO2 96%   BMI 17.41 kg/m      01/14/2024   10:46 AM 01/05/2024   11:24 AM 09/08/2023   10:06 AM  BP/Weight  Systolic BP 96 106 144  Diastolic BP 64 70 81  Wt. (Lbs) 95.2 96.8 96.8  BMI 17.41 kg/m2 17.7 kg/m2 17.7 kg/m2    Physical Exam Vitals and nursing note reviewed.  Constitutional:      Appearance: Normal appearance.  HENT:     Head: Normocephalic and atraumatic.  Cardiovascular:     Rate and Rhythm: Tachycardia present.  Pulmonary:     Effort: Pulmonary effort is normal.     Breath sounds: Normal breath sounds.  Musculoskeletal:     Comments: Walks with a walker  Neurological:     Mental Status: She is alert.     Comments: Not oriented to place. Knows her daughter and her caregiver      Diabetic Foot Exam - Simple   No data filed      Lab Results  Component Value Date   WBC 7.7 01/05/2024   HGB 12.4 01/05/2024   HCT 36.2 01/05/2024   PLT 272 01/05/2024   GLUCOSE 87 01/05/2024   CHOL 204 (H) 10/01/2022   TRIG 113 10/01/2022   HDL 46 10/01/2022   LDLCALC 138 (H) 10/01/2022   ALT 8 01/05/2024   AST 18 01/05/2024   NA 141 01/05/2024   K 4.6 01/05/2024   CL 101 01/05/2024   CREATININE 0.91 01/05/2024   BUN 22 01/05/2024   CO2 24 01/05/2024   TSH 2.190 01/27/2023   HGBA1C 5.3 11/09/2018      Assessment & Plan:  Essential hypertension, benign Assessment & Plan: Well controlled.  In fact, on Midodrine  5 mg three times daily No changes to medicines. Acetabutolol 200 mg every other day. Continue to work on eating a healthy diet and exercise.     B12 deficiency Assessment & Plan: On multivitamin. Normal b12 last year Defer blood work due to age   Pure hypercholesterolemia Assessment & Plan: Recommend continue to work on eating healthy diet and exercise. Not on a cholesterol lowering medication. Would not check blood work or recommend medication given  her age. Moreover her last blood work last year showed normal lipids.    Chronic obstructive pulmonary disease, unspecified COPD type (HCC) Assessment & Plan: Stable No inhalers on board. Former remote exposure to tobacco.   Severe episode of recurrent major depressive disorder, without psychotic features (HCC) Assessment & Plan: Continue remeron  45 mg daily    Recurrent urinary tract infection Assessment & Plan: Recent UTI treated with doxycycline  recently after fosfomycin was not covered by insurance. Symptoms improved, but disorientation persists when outside. Emphasized avoiding over-treatment of asymptomatic bacteriuria to prevent adverse effects like C. diff and kidney damage. Highlighted the importance of clean catch urine samples to confirm symptomatic infections before treatment. - Ensure proper collection of clean catch urine samples before treating future UTIs. - Avoid frequent antibiotic use unless symptomatic infection is confirmed.  HER MAIN SYMPTOM WITH UTI IS DISORIENTATION and not any other usual symptoms.,       Constipation and diarrhea Alternating constipation and diarrhea, with diarrhea often coinciding with UTIs, possibly due to contamination. Reluctance to drink fluids contributes to constipation. Current fiber intake may be insufficient. - Prescribe psyllium husk fiber supplement to be mixed with juice, one teaspoon daily. - Monitor bowel movements  and adjust fiber intake as needed.  Hypotension Blood pressure fluctuations with recent episodes of hypotension. Midodrine  prescribed to stabilize blood pressure. - Continue midodrine  as prescribed.  Atrial fibrillation AFib is well-controlled with regular EKGs showing stable condition. Currently on Eliquis  2.5 mg twice daily for anticoagulation. - Continue Eliquis  2.5 mg twice daily. - Perform annual EKG to monitor AFib.       Other orders -     Psyllium; Use 1 teaspoon with juice daily  Dispense:  368 g; Refill: 1   Assessment and Plan            Meds ordered this encounter  Medications   Psyllium 25 % POWD    Sig: Use 1 teaspoon with juice daily    Dispense:  368 g    Refill:  1    No orders of the defined types were placed in this encounter.    Follow-up: No follow-ups on file.  An After Visit Summary was printed and given to the patient.  Khristina Janota, MD [facility] [phone]

## 2024-01-14 NOTE — Assessment & Plan Note (Addendum)
 Recent UTI treated with doxycycline  recently after fosfomycin was not covered by insurance. Symptoms improved, but disorientation persists when outside. Emphasized avoiding over-treatment of asymptomatic bacteriuria to prevent adverse effects like C. diff and kidney damage. Highlighted the importance of clean catch urine samples to confirm symptomatic infections before treatment. - Ensure proper collection of clean catch urine samples before treating future UTIs. - Avoid frequent antibiotic use unless symptomatic infection is confirmed.  HER MAIN SYMPTOM WITH UTI IS DISORIENTATION and not any other usual symptoms.,       Constipation and diarrhea Alternating constipation and diarrhea, with diarrhea often coinciding with UTIs, possibly due to contamination. Reluctance to drink fluids contributes to constipation. Current fiber intake may be insufficient. - Prescribe psyllium husk fiber supplement to be mixed with juice, one teaspoon daily. - Monitor bowel movements and adjust fiber intake as needed.  Hypotension Blood pressure fluctuations with recent episodes of hypotension. Midodrine  prescribed to stabilize blood pressure. - Continue midodrine  as prescribed.  Atrial fibrillation AFib is well-controlled with regular EKGs showing stable condition. Currently on Eliquis  2.5 mg twice daily for anticoagulation. - Continue Eliquis  2.5 mg twice daily. - Perform annual EKG to monitor AFib.

## 2024-01-14 NOTE — Assessment & Plan Note (Signed)
 Continue remeron  45 mg daily

## 2024-02-01 MED ORDER — MIDODRINE HCL 5 MG PO TABS: 5.0000 mg | ORAL_TABLET | Freq: Three times a day (TID) | ORAL | 1 refills | Status: DC

## 2024-02-01 MED ORDER — ACEBUTOLOL HCL 200 MG PO CAPS: 200.0000 mg | ORAL_CAPSULE | ORAL | 1 refills | Status: DC

## 2024-02-13 ENCOUNTER — Other Ambulatory Visit: Payer: Self-pay | Admitting: Cardiology

## 2024-03-07 ENCOUNTER — Encounter: Payer: Self-pay | Admitting: Neurology

## 2024-03-07 ENCOUNTER — Telehealth: Payer: PPO | Admitting: Neurology

## 2024-03-07 VITALS — Ht 62.0 in | Wt 95.0 lb

## 2024-03-07 DIAGNOSIS — F03918 Unspecified dementia, unspecified severity, with other behavioral disturbance: Secondary | ICD-10-CM | POA: Diagnosis not present

## 2024-03-07 MED ORDER — MIRTAZAPINE 45 MG PO TABS: ORAL_TABLET | ORAL | 3 refills | Status: DC

## 2024-03-07 MED ORDER — DIVALPROEX SODIUM 125 MG PO CSDR: DELAYED_RELEASE_CAPSULE | ORAL | 3 refills | Status: AC

## 2024-03-07 NOTE — Progress Notes (Signed)
 Virtual Visit via Video Note The purpose of this virtual visit is to provide medical care while limiting exposure to the novel coronavirus.    Consent was obtained for video visit:  Yes.   Answered questions that patient had about telehealth interaction:  Yes.    Pt location: Home Physician Location: office Name of referring provider:  Sirivol, Mamatha, MD I connected with [name] at patients initiation/request on 03/07/2024 at 10:30 AM EDT by video enabled telemedicine application and verified that I am speaking with the correct person using two identifiers. Pt MRN:  [id number] Pt DOB:  08-17-1926 Video Participants:  [name];  [name] (daughter)   History of Present Illness:  The patient had a virtual video visit on 03/07/2024. 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 Her daughter Katherine provides much of the information. [name] is able to answer questions with 1-2 word answers and follow instructions. [name] sent a message in April that her mother has poor hearing even with hearing aids, but some days even if they talk slow or loud enough, it appears she cannot hear/understand. She is able to understand [name] for today's visit. When asked how she feels, she answers pretty good. She denies any headaches, dizziness, anxiety. [name] reports physically she is about the same, maybe moving a little slower. Some of the other issues are advancing. She mostly self-isolates in her room. She has a sitter coming 6 days a week in the mornings to keep her company. She goes to the dining room to eat, she says appetite is fair. [name] reports she has gained some weight. No changes in swallowing, not worse than before. She prefers not to participate in activities but occasionally takes part. Her aids are pretty good with getting her in the shower, she is good with showers and washing her hair but they can be  frustrating for her sometimes. [name] notes mood is staying pretty even. She is on Depakote  125mg  2 caps in AM, 4 caps in PM and Mirtazapine  45mg  at bedtime. Sleep is good, there have only been 1 or 2 incidents in the past 6 months where she walked at night. She takes really long naps in the afternoon. She uses her walker all the time, she can get up and go where she wants to go, moving a little slower.     Current Outpatient Medications on File Prior to Visit  Medication Sig Dispense Refill   acebutolol  (SECTRAL ) 200 MG capsule Take 1 capsule (200 mg total) by mouth every other day. 45 capsule 1   acetaminophen  (TYLENOL ) 325 MG tablet Take 325 mg by mouth as needed.     divalproex  (DEPAKOTE  SPRINKLE) 125 MG capsule Take 2 capsule in AM, 4 capsules in PM 540 capsule 2   ELIQUIS  2.5 MG TABS tablet TAKE 1 TABLET(2.5 MG) BY MOUTH TWICE DAILY 180 tablet 1   midodrine  (PROAMATINE ) 5 MG tablet Take 1 tablet (5 mg total) by mouth 3 (three) times daily with meals. 180 tablet 1   mirtazapine  (REMERON ) 45 MG tablet TAKE 1 TABLET BY MOUTH EVERY DAY AT NIGHT 90 tablet 0   Multiple Vitamins-Minerals (ALIVE WOMENS 50+ GUMMY) CHEW Chew by mouth.     pantoprazole  (PROTONIX ) 40 MG tablet Take 40 mg by mouth daily.     polyethylene glycol (MIRALAX  / GLYCOLAX ) 17 g packet Take 8.5 g by mouth daily. 1 each 0   Psyllium  25 % POWD Use 1 teaspoon with juice daily 368 g 1   No current facility-administered medications on file prior to visit.     Observations/Objective:   Vitals:   03/07/24 1012  Weight: 95 lb (43.1 kg)  Height: 5' 2 (1.575 m)   GEN:  The patient appears stated age and is in NAD. Neurological examination: Patient is awake, alert. She does not initiate conversation but answers questions appropriately with 1-2 word responses. She is able to follow simple instructions. Cranial nerves: Extraocular movements intact. No facial asymmetry. Motor: moves all extremities symmetrically, at least anti-gravity x  4. No incoordination on finger to nose testing.    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 She is in Memory Care with 24/7 care, her daughter reports that she is doing fine physically, there is expected gradual cognitive deterioration. She is on Depakote  sprinkles 125mg : 2 caps in AM, 4 caps in PM and Mirtazapine  45mg  at bedtime. We agreed on prn follow-up, daughter will discuss further refills for medications from PCP. They know to call for any changes.    Follow Up Instructions:   -I discussed the assessment and treatment plan with the patient's daughter. The patient's daughter was provided an opportunity to ask questions and all were answered. The patient's daughter agreed with the plan and demonstrated an understanding of the instructions.   The patient's daughter was advised to call back or seek an in-person evaluation if the symptoms worsen or if the condition fails to improve as anticipated.    Darice CHRISTELLA Shivers, MD

## 2024-03-24 DIAGNOSIS — I872 Venous insufficiency (chronic) (peripheral): Secondary | ICD-10-CM | POA: Diagnosis not present

## 2024-03-24 DIAGNOSIS — M6281 Muscle weakness (generalized): Secondary | ICD-10-CM | POA: Diagnosis not present

## 2024-03-24 DIAGNOSIS — R238 Other skin changes: Secondary | ICD-10-CM | POA: Diagnosis not present

## 2024-03-24 DIAGNOSIS — L600 Ingrowing nail: Secondary | ICD-10-CM | POA: Diagnosis not present

## 2024-03-24 DIAGNOSIS — F03911 Unspecified dementia, unspecified severity, with agitation: Secondary | ICD-10-CM | POA: Diagnosis not present

## 2024-03-24 DIAGNOSIS — R262 Difficulty in walking, not elsewhere classified: Secondary | ICD-10-CM | POA: Diagnosis not present

## 2024-03-24 DIAGNOSIS — R2689 Other abnormalities of gait and mobility: Secondary | ICD-10-CM | POA: Diagnosis not present

## 2024-03-24 DIAGNOSIS — B351 Tinea unguium: Secondary | ICD-10-CM | POA: Diagnosis not present

## 2024-04-01 ENCOUNTER — Other Ambulatory Visit: Payer: Self-pay

## 2024-04-01 MED ORDER — MIDODRINE HCL 5 MG PO TABS: 5.0000 mg | ORAL_TABLET | Freq: Two times a day (BID) | ORAL | 3 refills | Status: DC

## 2024-05-16 ENCOUNTER — Other Ambulatory Visit: Payer: Self-pay

## 2024-05-16 MED ORDER — APIXABAN 2.5 MG PO TABS: 2.5000 mg | ORAL_TABLET | Freq: Two times a day (BID) | ORAL | 1 refills | Status: DC

## 2024-05-16 MED ORDER — PANTOPRAZOLE SODIUM 40 MG PO TBEC: 40.0000 mg | DELAYED_RELEASE_TABLET | Freq: Every day | ORAL | 1 refills | Status: AC

## 2024-05-16 MED ORDER — APIXABAN 2.5 MG PO TABS: 2.5000 mg | ORAL_TABLET | Freq: Two times a day (BID) | ORAL | 1 refills | Status: AC

## 2024-05-27 DIAGNOSIS — R262 Difficulty in walking, not elsewhere classified: Secondary | ICD-10-CM | POA: Diagnosis not present

## 2024-05-27 DIAGNOSIS — M6281 Muscle weakness (generalized): Secondary | ICD-10-CM | POA: Diagnosis not present

## 2024-05-27 DIAGNOSIS — I872 Venous insufficiency (chronic) (peripheral): Secondary | ICD-10-CM | POA: Diagnosis not present

## 2024-05-27 DIAGNOSIS — B351 Tinea unguium: Secondary | ICD-10-CM | POA: Diagnosis not present

## 2024-05-27 DIAGNOSIS — F03911 Unspecified dementia, unspecified severity, with agitation: Secondary | ICD-10-CM | POA: Diagnosis not present

## 2024-05-27 DIAGNOSIS — R2689 Other abnormalities of gait and mobility: Secondary | ICD-10-CM | POA: Diagnosis not present

## 2024-05-27 DIAGNOSIS — R238 Other skin changes: Secondary | ICD-10-CM | POA: Diagnosis not present

## 2024-07-11 ENCOUNTER — Telehealth: Payer: Self-pay

## 2024-07-11 NOTE — Telephone Encounter (Signed)
 Appointment has been changed and daughter has been notified.

## 2024-07-11 NOTE — Telephone Encounter (Signed)
 Please advise if this is okay?  Copied from CRM [id number]. Topic: Appointments - Scheduling Inquiry for Clinic >> Jul 11, 2024 10:32 AM Antwanette L wrote: Reason for CRM: Othel, the patient's daughter, is calling to request that the patient's upcoming six-month follow-up appointment on November 17 be conducted virtually. The patient has advanced Alzheimer's disease and resides in a memory care facility, where removal for in-person visits causes significant confusion and distress. Gwendolyn is requesting a callback at [phone]

## 2024-07-18 ENCOUNTER — Telehealth

## 2024-07-18 VITALS — BP 100/43 | Ht 62.0 in | Wt 87.0 lb

## 2024-07-18 DIAGNOSIS — I480 Paroxysmal atrial fibrillation: Secondary | ICD-10-CM | POA: Diagnosis not present

## 2024-07-18 DIAGNOSIS — F02C4 Dementia in other diseases classified elsewhere, severe, with anxiety: Secondary | ICD-10-CM | POA: Insufficient documentation

## 2024-07-18 DIAGNOSIS — G4710 Hypersomnia, unspecified: Secondary | ICD-10-CM | POA: Diagnosis not present

## 2024-07-18 DIAGNOSIS — I959 Hypotension, unspecified: Secondary | ICD-10-CM

## 2024-07-18 MED ORDER — MIRTAZAPINE 45 MG PO TABS: 25.0000 mg | ORAL_TABLET | Freq: Every day | ORAL | 0 refills | Status: AC

## 2024-07-18 NOTE — Assessment & Plan Note (Signed)
 Age related.  No treatment

## 2024-07-18 NOTE — Assessment & Plan Note (Signed)
 Hypersomnia Excessive sleepiness potentially related to mirtazapine  (Remeron ) use. Currently taking 45 mg tablet. Decision made to reduce dose to improve wakefulness. - Reduced mirtazapine  dose to half of the current tablet. DECREASED DOSE TO 22.5 MG - Monitor for changes in wakefulness and sleep patterns over the next week or two.

## 2024-07-18 NOTE — Progress Notes (Signed)
 Virtual Visit via Video Note   This visit type was conducted per patient request This format is felt to be appropriate for this patient at this time.  All issues noted in this document were discussed and addressed.  A limited physical exam was performed with this format.  A verbal consent was obtained for the virtual visit.   Date:  07/18/2024   ID:  [name], DOB 25-Oct-1925, MRN [id number]  Patient Location: [facility] Provider Location: Office/Clinic  PCP:  Mendy Chou, MD   No chief complaint on file.    History of Present Illness:    Discussed the use of AI scribe software for clinical note transcription with the patient, who gave verbal consent to proceed.  Discussed the use of AI scribe software for clinical note transcription with the patient, who gave verbal consent to proceed.  History of Present Illness   [name] is a 89 year old female who is contacted via telehealth for medication management and evaluation of blood pressure and sleep issues. Her daughter IS THE PRIMARY HISTORIAN due to patient's age and dementia. Patient was present throughout the conversation.   Hypotension - Blood pressure readings remain low, around 90s/50s mmHg - Currently taking midodrine  twice daily for blood pressure management - No falls or episodes of syncope  Daytime somnolence - Significant daytime sleep impacting alertness. DAUGHTER MENTIONS PATIENT SLEEPS FOR 15-17 HOURS A DAY. - Currently taking mirtazapine  (Remeron ) at a dose of 45 mg  Preventive care - Last blood work conducted in May with no significant changes since - Received influenza vaccination in early October at her residence - Has not received COVID vaccine this season  Gastrointestinal function - Bowel movements are regular - No gastrointestinal issues           Past Medical History:  Diagnosis Date   Acute ischemic stroke 11/08/2018   left postcentral gyri; left  frontal   Adjustment disorder with depressed mood 10/16/2019   Alteration consciousness 03/08/2020   Apraxia following cerebrovascular accident 10/16/2019   B12 deficiency 10/16/2019   Bilateral impacted cerumen 04/02/2021   Body mass index (BMI) less than 19 02/16/2020   Chronic kidney disease, stage II (mild) 12/06/2019   Closed fracture of one rib of right side with routine healing 06/30/2017   Confusion    COPD (chronic obstructive pulmonary disease) 11/16/2017   Current use of long term anticoagulation 07/21/2017   Essential hypertension, benign 10/16/2019   First degree atrioventricular block 06/26/2017   Gait abnormality 09/24/2020   Hammer toe 06/26/2017   Hearing loss    Hyperlipidemia 06/26/2017   Laceration of right lower leg 06/18/2020   Major neurocognitive disorder due to Alzheimer's disease 08/16/2021   Malnutrition of moderate degree 12/06/2019   Memory loss    Other specified cardiac arrhythmias 06/26/2017   Pain in limb 06/26/2017   Palpitations 06/26/2017   Paroxysmal atrial fibrillation 06/26/2017   remote history post operatively, CHADS 2 score=2   Premature atrial complex 06/26/2017   Premature ventricular contractions 06/26/2017   PSVT (paroxysmal supraventricular tachycardia) 11/10/2017   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 10/16/2019   Thoracic aorta atherosclerosis 05/22/2020   Traumatic pneumothorax 06/30/2017    Past Surgical History:  Procedure Laterality Date   ABDOMINAL HYSTERECTOMY     HIP SURGERY  04/2022   SHOULDER SURGERY     TONSILLECTOMY      Family History  Problem Relation Age of Onset   Bone cancer Mother  Breast cancer Father    Diabetes Father    Heart disease Father    Dementia Sister    Breast cancer Sister    Alzheimer's disease Sister    Dementia Brother     Social History   Socioeconomic History   Marital status: Widowed    Spouse name: Not on file   Number of children: 2   Years of education: 51    Highest education level: 12th grade  Occupational History   Occupation: Retired  Tobacco Use   Smoking status: Never    Passive exposure: Past   Smokeless tobacco: Never  Vaping Use   Vaping status: Never Used  Substance and Sexual Activity   Alcohol use: No   Drug use: No   Sexual activity: Not Currently  Other Topics Concern   Not on file  Social History Narrative   Mrs. Rimmer is widowed. Her daughter, Fronie Brewer, is her POA and lives 12 miles away. She resides in her own home, alone for last 7 years. Since her CVA she has had someone with her most of the time. She has a hired caregiver or a family member stays most of the day. She feel safe in her home and in her neighborhood.   Right-handed.   0.5 cups caffeine per day.   One story home      Lives at [facility] [phone]   Social Drivers of Health   Financial Resource Strain: Low Risk  (07/15/2024)   Overall Financial Resource Strain (CARDIA)    Difficulty of Paying Living Expenses: Not hard at all  Food Insecurity: No Food Insecurity (07/15/2024)   Hunger Vital Sign    Worried About Running Out of Food in the Last Year: Never true    Ran Out of Food in the Last Year: Never true  Transportation Needs: No Transportation Needs (07/15/2024)   PRAPARE - Administrator, Civil Service (Medical): No    Lack of Transportation (Non-Medical): No  Physical Activity: Inactive (07/15/2024)   Exercise Vital Sign    Days of Exercise per Week: 0 days    Minutes of Exercise per Session: Not on file  Stress: No Stress Concern Present (07/15/2024)   [employer] of Occupational Health - Occupational Stress Questionnaire    Feeling of Stress: Not at all  Social Connections: Socially Isolated (07/15/2024)   Social Connection and Isolation Panel    Frequency of Communication with Friends and Family: Never    Frequency of Social Gatherings with Friends and Family: More than three times a week    Attends  Religious Services: Never    Database Administrator or Organizations: No    Attends Banker Meetings: Not on file    Marital Status: Widowed  Intimate Partner Violence: Not At Risk (06/25/2023)   Humiliation, Afraid, Rape, and Kick questionnaire    Fear of Current or Ex-Partner: No    Emotionally Abused: No    Physically Abused: No    Sexually Abused: No    Outpatient Medications Prior to Visit  Medication Sig Dispense Refill   acebutolol  (SECTRAL ) 200 MG capsule Take 1 capsule (200 mg total) by mouth every other day. 45 capsule 1   acetaminophen  (TYLENOL ) 325 MG tablet Take 325 mg by mouth as needed.     apixaban  (ELIQUIS ) 2.5 MG TABS tablet Take 1 tablet (2.5 mg total) by mouth 2 (two) times daily. 180 tablet 1   divalproex  (DEPAKOTE  SPRINKLE) 125 MG capsule Take  2 capsule in AM, 4 capsules in PM 540 capsule 3   midodrine  (PROAMATINE ) 5 MG tablet Take 1 tablet (5 mg total) by mouth 2 (two) times daily with a meal. 180 tablet 3   Multiple Vitamins-Minerals (ALIVE WOMENS 50+ GUMMY) CHEW Chew by mouth.     pantoprazole  (PROTONIX ) 40 MG tablet Take 1 tablet (40 mg total) by mouth daily. 90 tablet 1   polyethylene glycol (MIRALAX  / GLYCOLAX ) 17 g packet Take 8.5 g by mouth daily. 1 each 0   Psyllium 25 % POWD Use 1 teaspoon with juice daily 368 g 1   mirtazapine  (REMERON ) 45 MG tablet TAKE 1 TABLET BY MOUTH EVERY DAY AT NIGHT 90 tablet 3   No facility-administered medications prior to visit.    Allergies  Allergen Reactions   Codeine Nausea And Vomiting   Penicillins Rash   Sulfa Antibiotics Nausea And Vomiting     Social History   Tobacco Use   Smoking status: Never    Passive exposure: Past   Smokeless tobacco: Never  Vaping Use   Vaping status: Never Used  Substance Use Topics   Alcohol use: No   Drug use: No     Review of Systems  Constitutional:  Negative for fever and weight loss.       Excessive sleepiness  HENT:  Negative for congestion.    Respiratory:  Negative for cough.   Cardiovascular:  Negative for chest pain and leg swelling.  Gastrointestinal:  Negative for diarrhea, nausea and vomiting.  Musculoskeletal:  Negative for joint pain and myalgias.  Neurological:  Negative for headaches.  Psychiatric/Behavioral:  Negative for depression.      Labs/Other Tests and Data Reviewed:    Recent Labs: 01/05/2024: ALT 8; BUN 22; Creatinine, Ser 0.91; Hemoglobin 12.4; Platelets 272; Potassium 4.6; Sodium 141   Recent Lipid Panel Lab Results  Component Value Date/Time   CHOL 204 (H) 10/01/2022 10:49 AM   TRIG 113 10/01/2022 10:49 AM   HDL 46 10/01/2022 10:49 AM   CHOLHDL 4.4 10/01/2022 10:49 AM   CHOLHDL 4.3 11/09/2018 05:12 AM   LDLCALC 138 (H) 10/01/2022 10:49 AM    Wt Readings from Last 3 Encounters:  07/18/24 87 lb (39.5 kg)  03/07/24 95 lb (43.1 kg)  01/14/24 95 lb 3.2 oz (43.2 kg)     Objective:    Vital Signs:  BP (!) 100/43   Ht 5' 2 (1.575 m)   Wt 87 lb (39.5 kg)   LMP  (LMP Unknown)   BMI 15.91 kg/m    Physical Exam Vitals reviewed.  Constitutional:      Appearance: Normal appearance.      ASSESSMENT & PLAN:   Hypotensive episode Assessment & Plan:    Hypotension Blood pressure readings are low, with values in the nineties over fifties. Currently on midodrine  to prevent further lowering of blood pressure. No episodes of syncope or falls reported. - Continue midodrine  at current dose. DAUGHTER DID NOT FEEL THE NEED FOR DOSE INCREASE OR INCREASE FREQUENCY   Paroxysmal atrial fibrillation Assessment & Plan: Continue Eliquis  due to no significant fall risk at this time.   Excessive sleepiness Assessment & Plan: Hypersomnia Excessive sleepiness potentially related to mirtazapine  (Remeron ) use. Currently taking 45 mg tablet. Decision made to reduce dose to improve wakefulness. - Reduced mirtazapine  dose to half of the current tablet. DECREASED DOSE TO 22.5 MG - Monitor for changes in  wakefulness and sleep patterns over the next week or two.  Dementia in other diseases classified elsewhere, severe, with anxiety [employer]) Assessment & Plan: Age related.  No treatment   Other orders -     Mirtazapine ; Take 0.5 tablets (22.5 mg total) by mouth at bedtime.  Dispense: 45 tablet; Refill: 0   General Health Maintenance Received flu shot in early October. No COVID vaccine received this season. Last blood work was in May, and no urgent need to repeat until January or February. - Will repeat blood work in March or April next year.        No orders of the defined types were placed in this encounter.    Meds ordered this encounter  Medications   mirtazapine  (REMERON ) 45 MG tablet    Sig: Take 0.5 tablets (22.5 mg total) by mouth at bedtime.    Dispense:  45 tablet    Refill:  0    Assessment and Plan      Follow Up:  Virtual Visit  in 6 month(s)  Signed, Tommy Schimke, MD  07/18/2024 10:58 AM    [facility] [place]

## 2024-07-18 NOTE — Assessment & Plan Note (Signed)
 Continue Eliquis  due to no significant fall risk at this time.

## 2024-07-18 NOTE — Assessment & Plan Note (Signed)
  Hypotension Blood pressure readings are low, with values in the nineties over fifties. Currently on midodrine  to prevent further lowering of blood pressure. No episodes of syncope or falls reported. - Continue midodrine  at current dose. DAUGHTER DID NOT FEEL THE NEED FOR DOSE INCREASE OR INCREASE FREQUENCY

## 2024-07-21 ENCOUNTER — Other Ambulatory Visit: Payer: Self-pay

## 2024-07-21 ENCOUNTER — Ambulatory Visit (INDEPENDENT_AMBULATORY_CARE_PROVIDER_SITE_OTHER)

## 2024-07-21 DIAGNOSIS — N3000 Acute cystitis without hematuria: Secondary | ICD-10-CM | POA: Diagnosis not present

## 2024-07-21 LAB — POCT URINALYSIS DIP (CLINITEK)
Bilirubin, UA: NEGATIVE
Blood, UA: NEGATIVE
Glucose, UA: NEGATIVE mg/dL
Nitrite, UA: NEGATIVE
POC PROTEIN,UA: NEGATIVE
Spec Grav, UA: 1.02 (ref 1.010–1.025)
Urobilinogen, UA: 0.2 U/dL
pH, UA: 6 (ref 5.0–8.0)

## 2024-07-21 MED ORDER — CEPHALEXIN 500 MG PO CAPS: 500.0000 mg | ORAL_CAPSULE | Freq: Three times a day (TID) | ORAL | 0 refills | Status: DC

## 2024-07-21 NOTE — Progress Notes (Signed)
 Patient is in office today for a nurse visit for Repeat UA. Patient UA was  positive for WBCs. Urine culture was sent however it was not enough urine.  Dr Sirivol will send antibiotic for patient. [name], her daughter was notified.

## 2024-07-22 NOTE — Telephone Encounter (Signed)
 Copied from CRM [id number]. Topic: Clinical - Medication Question >> Jul 22, 2024 11:09 AM Sophia H wrote: Reason for CRM: Patient was prescribed antibiotics - daughter took meds to assisted living but without orders they cannot give them. Tawni is wanting to know if orders can be faxed over ASAP so they can start on medication. FAX # [phone]  cephALEXin  (KEFLEX ) 500 MG capsule

## 2024-07-24 LAB — URINE CULTURE

## 2024-07-25 ENCOUNTER — Ambulatory Visit: Payer: Self-pay

## 2024-07-29 DIAGNOSIS — I872 Venous insufficiency (chronic) (peripheral): Secondary | ICD-10-CM | POA: Diagnosis not present

## 2024-07-29 DIAGNOSIS — R2689 Other abnormalities of gait and mobility: Secondary | ICD-10-CM | POA: Diagnosis not present

## 2024-07-29 DIAGNOSIS — B351 Tinea unguium: Secondary | ICD-10-CM | POA: Diagnosis not present

## 2024-07-29 DIAGNOSIS — M6281 Muscle weakness (generalized): Secondary | ICD-10-CM | POA: Diagnosis not present

## 2024-07-29 DIAGNOSIS — F03911 Unspecified dementia, unspecified severity, with agitation: Secondary | ICD-10-CM | POA: Diagnosis not present

## 2024-07-29 DIAGNOSIS — R238 Other skin changes: Secondary | ICD-10-CM | POA: Diagnosis not present

## 2024-07-29 DIAGNOSIS — R262 Difficulty in walking, not elsewhere classified: Secondary | ICD-10-CM | POA: Diagnosis not present

## 2024-08-02 ENCOUNTER — Other Ambulatory Visit: Payer: Self-pay

## 2024-08-10 ENCOUNTER — Other Ambulatory Visit: Payer: Self-pay

## 2024-08-15 MED ORDER — METAMUCIL FIBER 2 G PO CHEW: 2.0000 | CHEWABLE_TABLET | Freq: Three times a day (TID) | ORAL | 3 refills | Status: AC

## 2024-08-16 ENCOUNTER — Other Ambulatory Visit: Payer: Self-pay

## 2024-09-05 MED ORDER — CEPHALEXIN 500 MG PO CAPS: 500.0000 mg | ORAL_CAPSULE | Freq: Three times a day (TID) | ORAL | 0 refills | Status: AC

## 2024-09-05 MED ORDER — CEPHALEXIN 500 MG PO CAPS: 500.0000 mg | ORAL_CAPSULE | Freq: Three times a day (TID) | ORAL | 0 refills | Status: DC

## 2024-09-05 NOTE — Addendum Note (Signed)
 Addended by: Kamuela Magos on: 09/05/2024 01:14 PM   Modules accepted: Orders

## 2024-09-05 NOTE — Addendum Note (Signed)
 Addended by: Sakura Denis on: 09/05/2024 09:43 AM   Modules accepted: Orders
